# Patient Record
Sex: Female | Born: 1980
Health system: Southern US, Community
[De-identification: ages and names within clinical notes are randomized; demographics above are authoritative.]

## PROBLEM LIST (undated history)

## (undated) DIAGNOSIS — F32A Depression, unspecified: Secondary | ICD-10-CM

## (undated) DIAGNOSIS — D649 Anemia, unspecified: Secondary | ICD-10-CM

## (undated) DIAGNOSIS — F419 Anxiety disorder, unspecified: Secondary | ICD-10-CM

## (undated) DIAGNOSIS — I10 Essential (primary) hypertension: Secondary | ICD-10-CM

## (undated) DIAGNOSIS — E669 Obesity, unspecified: Secondary | ICD-10-CM

## (undated) DIAGNOSIS — G43909 Migraine, unspecified, not intractable, without status migrainosus: Secondary | ICD-10-CM

## (undated) DIAGNOSIS — F329 Major depressive disorder, single episode, unspecified: Secondary | ICD-10-CM

## (undated) DIAGNOSIS — E785 Hyperlipidemia, unspecified: Secondary | ICD-10-CM

## (undated) DIAGNOSIS — M1990 Unspecified osteoarthritis, unspecified site: Secondary | ICD-10-CM

## (undated) HISTORY — DX: Major depressive disorder, single episode, unspecified: F32.9

## (undated) HISTORY — PX: KNEE SURGERY: SHX244

## (undated) HISTORY — PX: CHOLECYSTECTOMY: SHX55

## (undated) HISTORY — PX: WISDOM TOOTH EXTRACTION: SHX21

## (undated) HISTORY — DX: Hyperlipidemia, unspecified: E78.5

## (undated) HISTORY — DX: Anemia, unspecified: D64.9

## (undated) HISTORY — PX: ABDOMINAL HYSTERECTOMY: SHX81

## (undated) HISTORY — PX: CARPAL TUNNEL RELEASE: SHX101

## (undated) HISTORY — DX: Obesity, unspecified: E66.9

## (undated) HISTORY — DX: Unspecified osteoarthritis, unspecified site: M19.90

## (undated) HISTORY — DX: Depression, unspecified: F32.A

## (undated) HISTORY — PX: TUBAL LIGATION: SHX77

---

## 1998-04-18 ENCOUNTER — Other Ambulatory Visit: Admission: RE | Admit: 1998-04-18 | Discharge: 1998-04-18 | Payer: Self-pay

## 2001-01-27 ENCOUNTER — Other Ambulatory Visit: Admission: RE | Admit: 2001-01-27 | Discharge: 2001-01-27 | Payer: Self-pay | Admitting: Obstetrics and Gynecology

## 2001-04-07 ENCOUNTER — Ambulatory Visit (HOSPITAL_COMMUNITY): Admission: RE | Admit: 2001-04-07 | Discharge: 2001-04-07 | Payer: Self-pay | Admitting: Obstetrics and Gynecology

## 2001-04-07 ENCOUNTER — Encounter: Payer: Self-pay | Admitting: Obstetrics and Gynecology

## 2001-04-25 ENCOUNTER — Inpatient Hospital Stay (HOSPITAL_COMMUNITY): Admission: AD | Admit: 2001-04-25 | Discharge: 2001-04-25 | Payer: Self-pay | Admitting: Obstetrics and Gynecology

## 2001-06-25 ENCOUNTER — Inpatient Hospital Stay (HOSPITAL_COMMUNITY): Admission: AD | Admit: 2001-06-25 | Discharge: 2001-06-25 | Payer: Self-pay | Admitting: Obstetrics and Gynecology

## 2001-07-26 ENCOUNTER — Ambulatory Visit (HOSPITAL_COMMUNITY): Admission: RE | Admit: 2001-07-26 | Discharge: 2001-07-26 | Payer: Self-pay | Admitting: Obstetrics and Gynecology

## 2001-07-26 ENCOUNTER — Encounter: Payer: Self-pay | Admitting: Obstetrics and Gynecology

## 2001-08-10 ENCOUNTER — Inpatient Hospital Stay (HOSPITAL_COMMUNITY): Admission: AD | Admit: 2001-08-10 | Discharge: 2001-08-10 | Payer: Self-pay | Admitting: Obstetrics and Gynecology

## 2001-08-18 ENCOUNTER — Inpatient Hospital Stay (HOSPITAL_COMMUNITY): Admission: AD | Admit: 2001-08-18 | Discharge: 2001-08-21 | Payer: Self-pay | Admitting: Obstetrics and Gynecology

## 2001-08-18 ENCOUNTER — Encounter (INDEPENDENT_AMBULATORY_CARE_PROVIDER_SITE_OTHER): Payer: Self-pay

## 2002-08-01 ENCOUNTER — Encounter: Admission: RE | Admit: 2002-08-01 | Discharge: 2002-08-01 | Payer: Self-pay | Admitting: Family Medicine

## 2002-08-01 ENCOUNTER — Encounter: Payer: Self-pay | Admitting: Family Medicine

## 2002-11-02 ENCOUNTER — Emergency Department (HOSPITAL_COMMUNITY): Admission: EM | Admit: 2002-11-02 | Discharge: 2002-11-02 | Payer: Self-pay | Admitting: Emergency Medicine

## 2002-11-02 ENCOUNTER — Encounter: Payer: Self-pay | Admitting: Emergency Medicine

## 2003-08-14 ENCOUNTER — Emergency Department (HOSPITAL_COMMUNITY): Admission: EM | Admit: 2003-08-14 | Discharge: 2003-08-14 | Payer: Self-pay | Admitting: Emergency Medicine

## 2003-09-16 ENCOUNTER — Emergency Department (HOSPITAL_COMMUNITY): Admission: EM | Admit: 2003-09-16 | Discharge: 2003-09-17 | Payer: Self-pay | Admitting: Emergency Medicine

## 2003-09-17 ENCOUNTER — Emergency Department (HOSPITAL_COMMUNITY): Admission: EM | Admit: 2003-09-17 | Discharge: 2003-09-18 | Payer: Self-pay | Admitting: Emergency Medicine

## 2004-07-29 ENCOUNTER — Emergency Department (HOSPITAL_COMMUNITY): Admission: EM | Admit: 2004-07-29 | Discharge: 2004-07-30 | Payer: Self-pay | Admitting: Emergency Medicine

## 2004-12-02 ENCOUNTER — Emergency Department: Payer: Self-pay | Admitting: Unknown Physician Specialty

## 2005-01-11 ENCOUNTER — Emergency Department: Payer: Self-pay | Admitting: Emergency Medicine

## 2005-02-12 ENCOUNTER — Emergency Department: Payer: Self-pay | Admitting: Emergency Medicine

## 2005-03-01 ENCOUNTER — Emergency Department: Payer: Self-pay | Admitting: Emergency Medicine

## 2007-05-30 ENCOUNTER — Inpatient Hospital Stay (HOSPITAL_COMMUNITY): Admission: EM | Admit: 2007-05-30 | Discharge: 2007-06-01 | Payer: Self-pay | Admitting: Emergency Medicine

## 2007-06-01 ENCOUNTER — Inpatient Hospital Stay (HOSPITAL_COMMUNITY): Admission: AD | Admit: 2007-06-01 | Discharge: 2007-06-02 | Payer: Self-pay | Admitting: *Deleted

## 2007-06-01 ENCOUNTER — Ambulatory Visit: Payer: Self-pay | Admitting: *Deleted

## 2007-10-05 ENCOUNTER — Ambulatory Visit: Payer: Self-pay | Admitting: Vascular Surgery

## 2007-10-05 ENCOUNTER — Inpatient Hospital Stay (HOSPITAL_COMMUNITY): Admission: EM | Admit: 2007-10-05 | Discharge: 2007-10-07 | Payer: Self-pay | Admitting: Emergency Medicine

## 2008-05-22 ENCOUNTER — Emergency Department (HOSPITAL_COMMUNITY): Admission: EM | Admit: 2008-05-22 | Discharge: 2008-05-23 | Payer: Self-pay | Admitting: Emergency Medicine

## 2008-06-08 ENCOUNTER — Emergency Department (HOSPITAL_COMMUNITY): Admission: EM | Admit: 2008-06-08 | Discharge: 2008-06-08 | Payer: Self-pay | Admitting: Emergency Medicine

## 2008-06-25 ENCOUNTER — Ambulatory Visit (HOSPITAL_COMMUNITY): Admission: RE | Admit: 2008-06-25 | Discharge: 2008-06-25 | Payer: Self-pay | Admitting: Orthopedic Surgery

## 2008-08-09 ENCOUNTER — Encounter: Admission: RE | Admit: 2008-08-09 | Discharge: 2008-08-28 | Payer: Self-pay | Admitting: Orthopedic Surgery

## 2008-08-22 ENCOUNTER — Emergency Department (HOSPITAL_COMMUNITY): Admission: EM | Admit: 2008-08-22 | Discharge: 2008-08-22 | Payer: Self-pay | Admitting: Emergency Medicine

## 2009-05-17 ENCOUNTER — Emergency Department (HOSPITAL_COMMUNITY): Admission: EM | Admit: 2009-05-17 | Discharge: 2009-05-17 | Payer: Self-pay | Admitting: Emergency Medicine

## 2010-07-25 ENCOUNTER — Encounter: Admission: RE | Admit: 2010-07-25 | Discharge: 2010-07-25 | Payer: Self-pay | Admitting: Family Medicine

## 2010-10-26 ENCOUNTER — Encounter: Payer: Self-pay | Admitting: Family Medicine

## 2011-02-18 NOTE — Procedures (Signed)
EEG NUMBER:  [id number].   HISTORY OF PRESENT ILLNESS:  This is almost a 30 year old woman with anxiety and depression and  syncope issues.  Medications include Lovenox, Protonix, Ativan, Tylenol,  Zofran, Maalox, oxycodone, Dilaudid, guaifenesin, Ambien.  This was a  portable 17-channel EEG with one channel devoted to EKG utilizing  International 10/20 lead placement system.  The patient was described  clinically as being awake and drowsy.  She also appeared to be awake and  drowsy electrographically.  While awake, the background consisted of a  well-organized well-developed, well modulated 10 Hz alpha activity which  was predominant in posterior head regions and reactive to eye opening.  No interhemispheric asymmetry is identified.  No definite epileptiform  discharges were seen.  Photic stimulation did not produce any change in  background activity.  Hyperventilation, likewise produced no significant  change in the background activity.  During drowsiness, the patient's  background demonstrated decreased amplitude and frequency and some  central sharp activity.  The EKG monitor reveals relatively regular  rhythm with a rate of 72 beats per minute.   CONCLUSION:  Normal awake and drowsy EEG without seizure activity seen  during the course of today's recording.  Clinical correlation is  recommended.      Catherine A. Orlin Hilding, M.D.  Electronically Signed     GNF:AOZH  D:  10/06/2007 20:01:21  T:  10/07/2007 05:20:04  Job #:  [id number]

## 2011-02-18 NOTE — H&P (Signed)
NAMETENEIL, SHILLER              ACCOUNT NO.:  [id number]   MEDICAL RECORD NO.:  [id number]          PATIENT TYPE:  INP   LOCATION:  1428                         FACILITY:  [facility]   PHYSICIAN:  Della Goo, M.D. DATE OF BIRTH:  01/17/1981   DATE OF ADMISSION:  05/30/2007  DATE OF DISCHARGE:                              HISTORY & PHYSICAL   This is an unassigned patient.   CHIEF COMPLAINT:  Overdose.   HISTORY OF PRESENT ILLNESS:  This is a 30 year old female presenting to  the emergency department after taking an overdose of Xanax.  She reports  taking 10-12 Xanax 2 mg tablets after having a dispute with her mother  this evening.  The patient reports that she lives with her mother and  there has been tension in the household.  She denies that the incident  was a suicidal gesture.  She reports just wanting to feel better and  rest.   PAST MEDICAL HISTORY:  History of anxiety and depression.   PAST SURGICAL HISTORY:  History of a tubal ligation and C-section.   MEDICATIONS:  Xanax 2 mg one p.o. b.i.d. p.r.n.   ALLERGIES:  LATEX, SULFA.   SOCIAL HISTORY:  Nonsmoker, nondrinker.   FAMILY HISTORY:  Noncontributory   REVIEW OF SYSTEMS:  The patient denies having any nausea, vomiting,  diarrhea, constipation, chest pain, shortness of breath.  She does  report being sleepy at this time.   PHYSICAL EXAMINATION FINDINGS:  This is a 30 year old obese, well-  developed female in no acute distress  VITAL SIGNS:  (currently) Temperature 97.1, blood pressure 127/58, heart  rate 102, respirations 16-18, O2 saturation 97-100%.  HEENT:  Normocephalic, atraumatic.  Pupils equally round and reactive to  light.  Extraocular muscles are intact. Funduscopic benign.  Oropharynx  is clear.  NECK:  Supple and full range of motion.  No thyromegaly, adenopathy or  jugular venous distention.  CARDIOVASCULAR:  Regular rate and rhythm.  No murmurs, gallops or rubs.  LUNGS: Clear to  auscultation bilaterally.  ABDOMEN:  Positive bowel sounds, soft, nontender, nondistended.  EXTREMITIES:  Without cyanosis, clubbing or edema.  NEUROLOGIC:  The patient is drowsy but awake.  Her cranial nerves are  intact.  There are no motor or sensory deficits.  Her speech is clear  and her responses are appropriate.   LABORATORY STUDIES:  White blood cell count 11.7, hemoglobin 14.2,  hematocrit 41.4, MCV 81.5, platelets 424, neutrophils 71%, lymphocytes  22%.  Chemistry:  Sodium 144, potassium 4.0, chloride 108, CO2 28, BUN  7, creatinine 0.57, glucose 90.  Urine HCG negative.  Urine drug screen  positive for benzodiazepines.  Liver function tests:  Total bilirubin  0.5, alkaline phosphatase 49, AST 21, ALT 28, albumin 3.6.  Salicylates  less than 4.  Prothrombin time 0.8, INR 0.9.  Urinalysis:  Trace  leukocytes and large hemoglobin.   ASSESSMENT:  A 30 year old female being admitted with:  1. Xanax overdose.  2. Situational depression.  3. Obesity  4. Anxiety depression.   PLAN:  The patient will be admitted to a telemetry area for cardiac  monitoring.  Her oxygen will be monitored continuously as well.  A one-  to-one sitter has been requested and the patient has been placed on  suicide precautions.  The patient is n.p.o. at this particular point,  and IV fluids have been ordered.  A psychiatric consultation will also  be ordered.  GI and DVT prophylaxis have been ordered as well.      Della Goo, M.D.  Electronically Signed     HJ/MEDQ  D:  05/31/2007  T:  05/31/2007  Job:  [id number]

## 2011-02-18 NOTE — H&P (Signed)
NAMEAILEENA, Tanya [name]              ACCOUNT NO.:  [id number]   MEDICAL RECORD NO.:  [id number]          PATIENT TYPE:  INP   LOCATION:  5524                         FACILITY:  [facility]   PHYSICIAN:  Lonia Blood, M.D.DATE OF BIRTH:  December 31, 1980   DATE OF ADMISSION:  10/04/2007  DATE OF DISCHARGE:                              HISTORY & PHYSICAL   PRIMARY CARE PHYSICIAN:  Unassigned.   CHIEF COMPLAINT:  Syncope.   HISTORY OF PRESENT ILLNESS:  Ms. Tanya [name] is a 30 year old female  with a medical history as detailed below.  She had two distinct episodes  of syncope today.  She states this morning she was sitting, stood up to  answer the phone and shortly thereafter found herself on the floor.  Her  children were present.  Questioning from the family gives the history  from the children, that their mother did not shake or convulse.  There  was no loss of bowel or bladder continence.  The patient quickly  recovered.  She had a headache afterwards but no other symptoms.  There  was no promontory chest pain, shortness of breath or chest pressure.  The patient went about the day without difficulty.  Later in the day,  the patient stood up from a sitting position to walk across the room to  retrieve an object.  Again, she found herself on the floor some time  shortly thereafter.  EMS was called.  They transported the patient to  the emergency room.  Again, the patient had a headache after her syncope  but no other symptoms.  At present, she is resting comfortably.  The  patient reports that she had an episode of syncope multiple years ago,  underwent evaluation, and was told that they did not find anything  wrong.   REVIEW OF SYSTEMS:  A comprehensive review of systems is unremarkable.  The patient states that she has been in her usual state of health until  today.   PAST MEDICAL HISTORY:  1. Anxiety disorder.  2. Depression with overdose on Xanax August 2008.  3. Status post  tubal ligation.  4. Status post C-section x1 and spontaneous vaginal delivery x 1.  5. Hypertension.  6. Hypercholesterolemia.  7. Status post cholecystectomy.   PATIENT MEDICATIONS:  P.r.n. Xanax only.   ALLERGIES:  LATEX AND SULFA.   FAMILY HISTORY:  Reviewed in old records but is noncontributory to this  admission.   SOCIAL HISTORY:  The patient does not smoke.  She does not drink.  She  is divorced x2.  She lives with her mother.   DATA REVIEW:  BMET is unremarkable.  CBC is unremarkable.  Urine  pregnancy is negative.  CT scan of the head reveals an 8 mm lacunar  infarct on the left basal ganglia which appears to be subacute or older   PHYSICAL EXAMINATION:  VITAL SIGNS:  Temperature 97.3, blood pressure  127/72, heart rate 102, respiratory rate 20, O2 sat 99% on room air.  CBG 79.  GENERAL:  She is a well-developed, well-nourished female in no acute  respiratory distress.  HEENT:  Normocephalic, atraumatic.  Pupils equal, round, react to light  and accommodation, extraocular movements intact bilaterally, OC/OP  clear.  NECK:  No JVD, no lymphadenopathy.  LUNGS:  Clear to auscultation bilaterally, no wheeze or rhonchi.  CARDIOVASCULAR:  Regular rate and rhythm without murmur, gallop or rub,  no S1 or S2.  ABDOMEN:  Somewhat overweight, soft, bowel sounds present, no  hepatosplenomegaly, no rebound, no ascites.  EXTREMITIES:  No significant cyanosis, clubbing, edema of bilateral  lower extremities.  NEUROLOGIC:  Alert and oriented x4.  Cranial nerves II-XII are intact  bilaterally, 5/5 strength upper and lower extremities.  Intact sensation  to touch throughout.  No Babinski.   IMPRESSION/PLAN:  1. Syncope.  There are multiple potential etiologies for syncope in      this patient.  There is no evidence of an arrhythmia.  There is no      evidence of an acute myocardial infarction (which would be      exceedingly rare nonetheless).  There are no symptoms to suggest a       pulmonary embolism.  I am worried about the possibility of      substance abuse.  Urine drug screen is currently pending.  We will      add acetaminophen and aspirin levels given the patient's history of      attempted overdose.  This could simply represent a volume      depletion.  This could also be a vasovagal event.  We will check      orthostatics q.a.m.  We will monitor on telemetry.  We will check a      TSH.  We will further evaluate the CT scan findings as noted below.  2. Left basal ganglia lacuna.  The finding of a previous small CVA in      this 30 year old that is extremely unusual.  I wish to better      define this area of MRI.  The patient reports that she is extremely      claustrophobic.  We will premedicate the patient with Ativan      tomorrow, and we will carry out an MRI and MRA to further evaluate      and rule out any connection with her presenting symptom.  3. Hypertension.  The patient's blood pressure is currently well-      controlled.  4. Depression.  The patient appears to not be on any antidepressants      at the present time.  We will follow her symptoms clinically.      Lonia Blood, M.D.  Electronically Signed     JTM/MEDQ  D:  10/05/2007  T:  10/05/2007  Job:  [id number]

## 2011-02-18 NOTE — Discharge Summary (Signed)
Tanya [name], Tanya [name]              ACCOUNT NO.:  [id number]   MEDICAL RECORD NO.:  [id number]          PATIENT TYPE:  INP   LOCATION:  5526                         FACILITY:  [facility]   PHYSICIAN:  Wilson Singer, M.D.DATE OF BIRTH:  Jul 01, 1981   DATE OF ADMISSION:  10/04/2007  DATE OF DISCHARGE:  10/07/2007                               DISCHARGE SUMMARY   FINAL DISCHARGE DIAGNOSIS:  Syncope, unclear etiology.   CONDITION ON DISCHARGE:  Stable.   MEDICATIONS ON DISCHARGE:  None.   HISTORY OF PRESENT ILLNESS:  This 30 year old lady was admitted with a  syncopal episode which occurred twice.  Please see initial history and  physical examination done by Dr. Jetty Duhamel.   HOSPITAL PROGRESS:  The patient had a CT scan which showed a left basal  ganglion lacunar infarct possibly.  However, when she underwent an MRI  scan of the brain and this did not confirm any infarct whatsoever.  I  think there was an abnormality seen which was misinterpreted.  She was  seen by neurology who agreed with this finding that the MRI scan was  normal.  She also underwent an EEG which entirely normal.  She did not  have any arrhythmias while in the hospital.  I wonder if she did  actually have a seizure but this needs to be worked up eventually as an  outpatient.  Dr. Orlin Hilding has seen the patient and has agree that she is  stable to be discharged home and I asked her to go home and stay with  her mother for the next few days so that she can be kept an eye on.  She  must follow up with Dr. Orlin Hilding in the neurology outpatient office and  also her primary care physician for further evaluation of these syncopal  episodes.  She should not do any driving in the meantime until this is  investigated.      Wilson Singer, M.D.  Electronically Signed     NCG/MEDQ  D:  10/07/2007  T:  10/07/2007  Job:  [id number]

## 2011-02-18 NOTE — Discharge Summary (Signed)
NAMELEIA, COLETTI              ACCOUNT NO.:  [id number]   MEDICAL RECORD NO.:  [id number]          PATIENT TYPE:  IPS   LOCATION:  0604                          [facility]   PHYSICIAN:  Jasmine Pang, M.D. DATE OF BIRTH:  Feb 22, 1981   DATE OF ADMISSION:  06/01/2007  DATE OF DISCHARGE:  06/02/2007                               DISCHARGE SUMMARY   IDENTIFICATION:  This is a 30 year old divorced white female who was  admitted to [facility] for benzodiazepine overdose.  She took  10 to 12 2 mg Xanax.  She states she was fighting with her mother.  She  acknowledges she deliberately took an overdose after one such argument.  She describes approximately three weeks of depressed mood, decreased  energy, irritability and some reduction in interest, difficulty  concentrating and insomnia.  The patient describes approximately has a 52-  year-old and a 32 year old and she has been forced to move back in with  her mother due to her ex-husband being in jail.  When her husband ex-  husband was out of jail, the patient and he would share child rearing  and watching activities.  The patient and her mother do not have any  physical abuse occurring; however, they will frequently get into a  verbal disputes over a number of differences.  The patient has not had  any hallucinations or delusions.  She has been experiencing insomnia  being up to approximately 3:00 a.m. in the morning.  Then when the  children get up, she is very exhausted and miserable.  She has had  difficulty performing her ADLs.  She denies any suicide attempt,  however, she acknowledges that her judgment has been impaired and she  has been impulsive with her fatigue, insomnia, irritability and  depression.  The patient denies any previous self-harm gestures.  She  has never been in a psychiatric hospital.  She denies psychiatric care.  She has been treated with Xanax 2 mg p.o. b.i.d. for excessive feeling  on edge,  muscle tension and excessive worry.  There is no known family  psychiatric history.  The patient denies any alcohol or illegal drug  abuse.  She is not on any medication medically.  She status post an  overdose of benzodiazepine.  The patient's obtundation has resolved.  She is allergic to codeine, latex and sulfa drugs.   PHYSICAL FINDINGS:  A complete physical exam was done while she was in  the hospital.  She had no acute physical problems.   LABORATORY DATA:  These were done in the hospital prior to her transfer  to our unit.  Aspirin was negative.  Hepatic function panel was  unremarkable.  SGOT normal at 21.  SGPT normal at 28.  Urine drug screen  is positive for benzodiazepine.  Urine HCG was negative.  Alcohol  negative.  Tylenol negative.  WBC 11.7, hemoglobin 14.2 and platelet  count 424.   HOSPITAL COURSE:  Upon admission, the patient was started on Ambien 5 mg  p.o. q.h.s. p.r.n. insomnia.  She was also started on Ativan 1 mg p.o.  q.6h. p.r.n. symptoms of agitation or withdrawal.  The patient tolerated  her medications well with no side effects to the Ambien.  She did not  use the Ativan.  She was friendly and cooperative.  She was able to  participate appropriately in unit therapeutic groups and activities.  She denies that she was or is suicidal.  She states she just wanted  relief from her anxiety.  She is very much wanting to return home  because her children have just started school.  She states she realizes  she is a caretaker for others and was willing to consider therapy and  psychiatric followup.  Her mental status upon discharge was euthymic  mood, the affect wide range.  No suicidal or homicidal ideation.  No  thoughts of self injurious behavior.  No auditory or visual  hallucinations.  No paranoia or delusions.  Thoughts were logical and  goal-directed.  Thought content no predominant theme.  Cognitive was  grossly back to baseline which was within normal  limits.  She was felt  to be safe to be discharged home today and has agreed to followup  treatment once home.  She was going to get a ride home from the hospital  with her mother.   DISCHARGE DIAGNOSES:  AXIS I:  1. Major depressive disorder, recurrent, severe.  2. Major depressive disorder, single episode, severe.  3. Anxiety disorder not otherwise specified.  AXIS II:  None.  AXIS III:  None.  (Status post benzodiazepine overdose, but medically  stable now).  AXIS IV:  Moderate (primary support group, marital, economic, burden of  psychiatric illness).  AXIS V:  Global assessment of functioning upon discharge was 50.  Global  assessment of functioning upon admission was 45.  Global assessment of  functioning highest past year 65-70.   DISCHARGE PLANS:  There were no specific activity level or dietary  restrictions.   POST HOSPITAL CARE PLANS:  The patient will be seen at the [facility] on June 03, 2007 at 9 o'clock a.m.  She will be  seen at [facility] with Marquis Lunch on June 08, 2007 at 1:30 p.m.   DISCHARGE MEDICATIONS:  1. Celexa 20 mg daily (this was just started for her).  2. Ambien 5 mg 1-2 pills at bedtime.  3. She was also advised to resume her Xanax 2 mg p.o. b.i.d. until she      sees her new psychiatrist.      Jasmine Pang, M.D.  Electronically Signed     BHS/MEDQ  D:  06/02/2007  T:  06/03/2007  Job:  [id number]

## 2011-02-18 NOTE — H&P (Signed)
Tanya [name], Tanya [name]              ACCOUNT NO.:  [id number]   MEDICAL RECORD NO.:  [id number]          PATIENT TYPE:  IPS   LOCATION:  0604                          [facility]   PHYSICIAN:  Jasmine Pang, M.D. DATE OF BIRTH:  1981-04-28   DATE OF ADMISSION:  06/01/2007  DATE OF DISCHARGE:  06/02/2007                       PSYCHIATRIC ADMISSION ASSESSMENT   IDENTIFYING INFORMATION:  This is a 30 year old female voluntarily  admitted on June 01, 2007.   HISTORY OF PRESENT ILLNESS:  The patient presents with a history of  intentional overdose, taking 10-12 2 mg Xanax of her own medications  after arguing with her mother.  The patient states that she was hoping  to feel better.  She states it was very impulsive.  She denies it was a  suicide attempt.  She was not feeling well and then her mother-in-law  had noticed that her speech was slurred and had called 9-1-1.  The  patient is feeling very overwhelmed.  Her stressors are she has had to  move back in with her mother, her fiance is in jail, hopeful to be  released in September.  She is unemployed.  She has to care for  everyone, two children she is caring for.  The does not feel like she  needs to be here.  Needs to be home with her children.   PAST PSYCHIATRIC HISTORY:  First admission to the [facility].  No prior inpatient or outpatient treatment.   SOCIAL HISTORY:  This is a 30 year old single female.  Has two children,  ages 65 and 27.  The patient is currently living with her mother.  Will  be moving out when her fiance gets out of jail.  She is unemployed.   FAMILY HISTORY:  None.   ALCOHOL/DRUG HISTORY:  No alcohol or drug use.   PRIMARY CARE PHYSICIAN:  Dr. Clarene [name].   MEDICAL PROBLEMS:  Denies any acute or chronic major medical issues.  Has a complaint of a toothache however.   MEDICATIONS:  Xanax 2 mg b.i.d.  Normally just takes Xanax 2 mg at  bedtime which she states gets her some relief with  insomnia but wakes up  during the night and is feeling groggy in the morning.   ALLERGIES:  No known allergies.   PHYSICAL EXAMINATION:  The patient was assessed at [facility]  where she was admitted for her overdose.  She is an overweight, young  female.  She is in no acute distress.  Her temperature is 97.5, heart  rate 92, respirations 18, blood pressure is 130/95, weight 256 pounds,  height 5 feet 1 inch tall.   LABORATORY DATA:  Her urine drug screen was positive for  benzodiazepines.  Pregnancy test is negative.  Acetaminophen level less  than 10.  Salicylate level less than 4.  WBC count is mildly elevated at  11.7.   MENTAL STATUS EXAM:  She is fully alert, cooperative, fair eye contact,  casually dressed.  Speech is clear, normal pace and tone.  The patient's  mood is depressed, some mild anxiety.  Affect is pleasant, agreeable  to  recommendations.  Her thought process is no evidence any thought  disorder.  No delusions.  No paranoia.  Denied any suicidal or homicidal  thoughts.  Cognitive function intact.  Her memory is good.  Judgment and  insight is good.  She appears sincere.   DIAGNOSES:  AXIS I:  Major depressive disorder, single episode.  AXIS II:  Deferred.  AXIS III:  Status post benzodiazepine overdose.  AXIS IV:  Other psychosocial problems, problems with primary support  group relating to her mother, problems with occupation).  AXIS V:  45.   PLAN:  Contract for safety.  Stabilize her mood and thinking.  Will  consider a family session with her mother.  The patient could benefit  from some outpatient therapy and a trial of an antidepressant.   TENTATIVE LENGTH OF STAY:  Two to three days.      Landry Corporal, N.P.      Jasmine Pang, M.D.  Electronically Signed    JO/MEDQ  D:  06/02/2007  T:  06/02/2007  Job:  [id number]

## 2011-02-18 NOTE — Consult Note (Signed)
[name], TIEKEN              ACCOUNT NO.:  [id number]   MEDICAL RECORD NO.:  [id number]          PATIENT TYPE:  INP   LOCATION:  5526                         FACILITY:  [facility]   PHYSICIAN:  Catherine A. Orlin Hilding, M.D.DATE OF BIRTH:  [birth year]   DATE OF CONSULTATION:  10/06/2007  DATE OF DISCHARGE:                                 CONSULTATION   CHIEF COMPLAINT:  Syncope.   HISTORY OF PRESENT ILLNESS:  Ms. Tanya [name] is a 30-year right-  handed white woman with a history of anxiety and depression.  According  to the chart the patient denies any medical history.  She had a syncopal  episode about five years ago with a negative workup.  She does not  remember much of the details.  Apparently she had two spells yesterday.  She gives me a somewhat different history than is available in the  admitting physician's history.  She tells me the first episode she was  sitting on the couch with her daughter and then she does slumped over  and that her daughter said that she was shaking.  She came to and  shortly after that was doing reasonably well.  Later in the day she was  walking down the hall talking to her husband on the telephone and  apparently just lost consciousness and passed out.  Her 10 year old  daughter called the patient's mother who then called 9-1-1 and she was  brought in for evaluation.  She denies any premonitory signs of any  kind.  This history differs slightly from that given to the admitting  physician who said that the initial episode she had been sitting, stood  up to answer the phone and then shortly thereafter found herself on the  floor.  This also says that her children were present and the children  did not see any shaking or convulsing,  although now I am getting a  history from the patient that her daughter said that she did have some  shaking.  She did again have a headache afterwards but no other  symptoms.  The second episode was that she was walking  down the hall and  talking on the phone; that seems similar to what she gave me.  She has  had some intermittent headaches since then and felt generally poorly  since then in a nonspecific fashion.   REVIEW OF SYSTEMS:  A 12-system review is negative except for nausea and  headache currently.   PAST MEDICAL HISTORY:  She says is negative for any medical problems of  any kind, however, the chart indicates that she has hyperlipidemia and  hypertension and significant depression and anxiety.  She was  hospitalized with a Xanax overdose in August 2008.  She is obese.  She  has had a tubal ligation and a C-section as well as a cholecystectomy.   MEDICATIONS:  She said she was not taking anything by prescription but  the admission notes indicate p.r.n. Xanax only.  Despite the recent  overdose on Xanax in August.   ALLERGIES:  LATEX AND SULFA.   SOCIAL HISTORY:  She is  married, has two children.  Does not smoke or  drink alcohol.  Again I get a different history.  The chart says that  she is divorced but she said that she is married and a man in the room  with her at the moment claims to be her husband.   FAMILY HISTORY:  Positive for a half brother with a developmental delay  and congenital abnormality of some sort of with seizures.   OBJECTIVE:  VITAL SIGNS:  Temperature is 97.6, pulse 78, respirations  20, blood pressure is 126/81, 96% sat on room air.  She is obese.  HEAD:  Normocephalic, atraumatic.  NECK:  Supple without bruits.  HEART:  Regular rate and rhythm.   MENTAL STATUS:  GENERAL:  She is awake, alert with normal language.  CRANIAL NERVES:  Her pupils are equal and reactive.  Visual fields are  full.  Extraocular movements are intact.  Facial sensation is normal.  Facial motor activity is normal.  Hearing is intact.  Palate is  symmetric.  Tongue is in the midline and she had normal shoulder shrug.  MOTOR:  Shows normal station and gait.  She has no drift.  She has   normal rapid fine movements.  No satelliting.  She has normal bulk, tone  and strength throughout with 5/5 strength in all four extremities.  No  fasciculations, atrophy or tremor.  Deep tendon reflexes are 2-3+ and  symmetric with downgoing toes to plantar stimulation.  COORDINATION:  Finger-to-nose, heel-to-shin are normal.  Sensory is  normal.   LABORATORY DATA:  MRI scan of the brain is normal.  CT was read as  equivocal for an old lacuna because of a dilated prevascular space in  the inferior medial temporal region in the hypothalamic area under the  basal ganglia where there is very commonly a dilated perivascular space.  That is what it turned out to be an MR.  MRA is negative for any  vertebrobasilar insufficiency.  An EEG has been ordered but is pending  at this time.  Homocystine level is within acceptable limits at 6.  B-  met is normal except for mildly elevated glucose randomly at 104.  CBC  is normal.  TSH is normal.  Random cortisol is 25.6, not sure how to  interpret that.  Hemoglobin A1c is normal at 5.7.  Lipid profile does  show she has high cholesterol at 235 with a LDL of 171 which is rather  high.  Even though she does not give a history of that there is a  history in the chart of that.  Sed rate was 15 which is normal.  Drug  screen was positive for benzodiazepines.  As noted she is taking Xanax  p.r.n.   IMPRESSION:  Syncope of unclear etiology.  No evidence of  vertebrobasilar insufficiency or stroke by MRI, MRA or history.  I do  not think that this is seizure either but she does need an EEG.   RECOMMENDATIONS:  EEG.  Would continue cardiac monitoring while she is  in the house.      Catherine A. Orlin Hilding, M.D.  Electronically Signed     CAW/MEDQ  D:  10/06/2007  T:  10/06/2007  Job:  [id number]

## 2011-02-18 NOTE — Discharge Summary (Signed)
Tanya [name], CHAMBERS              ACCOUNT NO.:  [id number]   MEDICAL RECORD NO.:  [id number]          PATIENT TYPE:  INP   LOCATION:  1428                         FACILITY:  [facility]   PHYSICIAN:  Isidor Holts, M.D.  DATE OF BIRTH:  11-Feb-1981   DATE OF ADMISSION:  05/30/2007  DATE OF DISCHARGE:  05/31/2007                               DISCHARGE SUMMARY   PRIMARY CARE PHYSICIAN:  Unassigned.   DISCHARGE DIAGNOSES:  1. Benzodiazepine overdose.  2. Suicide attempt.  3. History of anxiety/depression.   DISCHARGE MEDICATIONS:  None.   PROCEDURES:  None.   CONSULTATIONS:  Dr. Antonietta Breach, psychiatrist.   ADMISSION HISTORY:  As in H&P notes of May 30, 2007 dictated by Dr.  Della Goo.  However, in brief, this is a 30 year old female with  known history of anxiety and depression, prior history of tubal  ligation, who presents following ingestion of 10-12 x 2-mg tablets of  Xanax, following a dispute with her mother.  She was admitted for  further evaluation, investigation, and management.   CLINICAL COURSE:  1. Benzodiazepine overdose.  The patient took a moderate amount of 2-      mg Xanax, i.e., 10-12 tablets.  At the time she was seen in the      emergency department, she was somewhat sleepy.  However, during the      course of overnight observation, the patient had no new issues or      developments.  She remained stable from a respiratory and      hemodynamic view point and on the a.m. of May 31, 2007, the      patient was fully alert, oriented, communicative, and showed no      evidence of respiratory distress whatsoever.   1. Suicide attempt.  For details of presentation, refer to admission      history above.  The patient was placed on one-on-one watch during      the course of hospitalization.  Psychiatric evaluation was      requested.   DISPOSITION:  As of May 31, 2007, the patient was deemed sufficiently  stable from a medical point to be  discharged, however, final disposition  will depend on psychiatry recommendations.   DIET:  Regular.   FOLLOWUP INSTRUCTIONS:  Per psychiatry.      Isidor Holts, M.D.  Electronically Signed     CO/MEDQ  D:  05/31/2007  T:  05/31/2007  Job:  [id number]
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
## 2011-02-21 NOTE — Op Note (Signed)
[facility]  Patient:    Tanya [name], Tanya [name] Visit Number: [id number] MRN: [id number]          Service Type: OBS Location: 910A [id number] [id number] Attending Physician:  Shaune Spittle Proc. Date: 08/18/01 Admit Date:  08/18/2001                             Operative Report  PREOPERATIVE DIAGNOSIS:       Intrauterine pregnancy at [redacted] weeks gestation. History of macrosomic infant.  History of should dystocia.  Desire for surgical sterilization.  POSTOPERATIVE DIAGNOSIS:      Intrauterine pregnancy at [redacted] weeks gestation. History of macrosomic infant.  History of should dystocia.  Desire for surgical sterilization.  OPERATION:                    Primary low transverse cesarean section and bilateral tubal ligation.  SURGEON:                      Vanessa P. Pennie Rushing, M.D.  ASSISTANT:                    Mack Guise, C.N.M.  ANESTHESIA:                   Spinal.  ESTIMATED BLOOD LOSS:         Less than 1000 cc.  COMPLICATIONS:                None.  FINDINGS:                     Uterus, tubes, and ovaries were normal for the gravid state.  The patient was delivered of a female infant whose name was Greig Castilla, weighing 7 pounds 15 ounces with Apgars of 8 and 9 at one and five minutes, respectively.  DESCRIPTION OF PROCEDURE:     The patient was taken to the operating room after appropriate identification and placed on the operating table.  After placement of a spinal anesthetic, she was placed in the supine position with a left lateral tilt.  The abdomen and perineum were prepped with multiple layers of Betadine and a Foley catheter inserted into the bladder under sterile conditions and connected to straight drainage.  The abdomen was draped as a sterile field.  A transverse incision was made in the abdomen after assurance of adequate anesthesia and the abdomen opened in layers.  The peritoneum was entered.  The uterus was incised approximately 2 cm  above the uterovesical fold and that incision taken laterally on either side.  The infant was delivered from the occipitotransverse position with the aid of a Kiwi vacuum extractor and after having the nares and pharynx suctioned and the cord clamped and cut, was handed off to the awaiting pediatricians.  The appropriate cord blood was drawn and the placenta manually removed from the uterus.  The uterine incision was closed with a running interlocking suture of 0 Vicryl.  An imbricating suture of 0 Vicryl was placed.  Copious irrigation was carried out.  The left fallopian tube was identified, followed to its fimbriated end, and then grasped at the isthmic portion and elevated.  A suture of 2-0 chromic was placed through the mesosalpinx and tied fore and aft on the knuckle of tube.  A second ligature was placed proximal to that.  The intervening knuckle of tube was excised and  the cut ends cauterized.  A similar procedure was carried out on the opposite side.  The portions of tube were removed from the operative field.  Copious irrigation was carried out and hemostasis noted to be adequate.  The abdominal peritoneum was closed with a running suture of 2-0 Vicryl.  The rectus muscles were reapproximated in the midline with a figure-of-eight suture of 2-0 Vicryl.  The rectus fascia was closed with a running suture of 0 Vicryl and then reinforced on either side of midline with figure-of-eight sutures of 0 Vicryl.  The subcutaneous tissue was irrigated and made hemostatic with Bovie cautery.  Skin staples were applied to the skin incision.  A sterile dressing was applied.  The patient was taken from the operating room to the recovery room in satisfactory condition having tolerated the procedure well with sponge, needle, and instrument counts were correct.  The infant went to the fullterm nursery. Attending Physician:  Shaune Spittle DD:  08/18/01 TD:  08/18/01 Job:  [id number]

## 2011-02-21 NOTE — Discharge Summary (Signed)
[facility]  Patient:    Tanya [name], Tanya [name] Visit Number: [id number] MRN: [id number]          Service Type: OBS Location: 910A [id number] [id number] Attending Physician:  Shaune Spittle Dictated by:   Wynelle Bourgeois, CNM Admit Date:  08/18/2001 Discharge Date: 08/21/2001                             Discharge Summary  ADMISSION DIAGNOSES:          1. Intrauterine pregnancy at 38 weeks.                               2. History of macrosomic infant.                               3. History of shoulder dystocia.                               4. Desires sterilization.  DISCHARGE DIAGNOSES:          1. Intrauterine pregnancy at 38 weeks.                               2. History of macrosomic infant.                               3. History of shoulder dystocia.                               4. Desires sterilization.                               5. Status post primary low transverse cesarean                                  section and bilateral tubal ligation.  HOSPITAL PROCEDURES:          1. Spinal anesthesia.                               2. Primary low transverse cesarean section.                               3. Bilateral tubal ligation.  HOSPITAL COURSE:              The patient was admitted for an elective primary low transverse cesarean section for a history of shoulder dystocia and this was performed by Maris Berger. Haygood, M.D., under spinal anesthesia.  She was delivered of a female infant named Greig Castilla weighing 7 pounds 15 ounces, Apgars 8 and 9, with an EBL of 1000 cc.  Her postoperative course was unremarkable other than difficulty in achieving pain management.  She complained of persistent burning in her incision despite trials of ibuprofen, Darvocet, and Demerol.  She also was given Toradol without relief and was finally relieved by Dilaudid and  Naprosyn.  On postoperative day #3, she was doing well and vital signs were stable.  She was  afebrile.  Her heart rate was with regular rate and rhythm.  Lungs clear to auscultation bilaterally.  The abdomen was soft and appropriately tender, except for burning pain at the site of her incision.  The fundus was firm and the incision was healing.  Staples were removed.  Lochia was small.  Extremities with negative Homans.  She was deemed to have received the full benefit of her hospital stay and was discharged home on August 21, 2001.  POSTOPERATIVE LABORATORY DATA:    White blood cell count 10.5, hemoglobin 9.9, hematocrit 28.8, platelets 311.  RPR nonreactive.  DISCHARGE MEDICATIONS:        Dilaudid 1 mg.  She is to take two of those every four hours p.r.n. pain.  She was also given a prescription for Naprosyn 500 mg b.i.d. p.r.n. pain.  DISCHARGE INSTRUCTIONS:       Per [facility] handout.  DISCHARGE FOLLOW-UP:          In six weeks or p.r.n. at [facility]. Dictated by:   Wynelle Bourgeois, CNM Attending Physician:  Shaune Spittle DD:  08/21/01 TD:  08/22/01 Job: [id number]

## 2011-07-08 LAB — RAPID URINE DRUG SCREEN, HOSP PERFORMED
Cocaine: NOT DETECTED
Tetrahydrocannabinol: NOT DETECTED

## 2011-07-08 LAB — ACETAMINOPHEN LEVEL: Acetaminophen (Tylenol), Serum: <10 — ABNORMAL LOW

## 2011-07-08 LAB — URINALYSIS, ROUTINE W REFLEX MICROSCOPIC
Glucose, UA: NEGATIVE
Ketones, ur: NEGATIVE
Nitrite: NEGATIVE
Protein, ur: NEGATIVE
Urobilinogen, UA: 0.2

## 2011-07-08 LAB — PREGNANCY, URINE: Preg Test, Ur: NEGATIVE

## 2011-07-08 LAB — POCT I-STAT, CHEM 8
BUN: 4 — ABNORMAL LOW
Chloride: 104
Creatinine, Ser: 0.8
Potassium: 3.8
Sodium: 141

## 2011-07-08 LAB — CBC
MCHC: 33.4
RBC: 5.14 — ABNORMAL HIGH
RDW: 13.1

## 2011-07-08 LAB — DIFFERENTIAL
Basophils Absolute: 0
Basophils Relative: 0
Monocytes Relative: 5
Neutro Abs: 7.8 — ABNORMAL HIGH
Neutrophils Relative %: 72

## 2011-07-08 LAB — HEPATIC FUNCTION PANEL
AST: 21
Albumin: 3.9

## 2011-07-11 LAB — DIFFERENTIAL
Eosinophils Absolute: 0.1
Eosinophils Relative: 1
Lymphocytes Relative: 17
Lymphs Abs: 1.5
Monocytes Relative: 9
Neutrophils Relative %: 72

## 2011-07-11 LAB — URINALYSIS, ROUTINE W REFLEX MICROSCOPIC
Hgb urine dipstick: NEGATIVE
Nitrite: NEGATIVE
Specific Gravity, Urine: 1.022
Urobilinogen, UA: 0.2

## 2011-07-11 LAB — BASIC METABOLIC PANEL
Calcium: 8.4
Chloride: 103
Creatinine, Ser: 0.61
GFR calc Af Amer: >60
GFR calc Af Amer: >60
GFR calc non Af Amer: >60
Glucose, Bld: 104 — ABNORMAL HIGH
Potassium: 4.4
Sodium: 138

## 2011-07-11 LAB — RAPID URINE DRUG SCREEN, HOSP PERFORMED
Amphetamines: NOT DETECTED
Benzodiazepines: POSITIVE — AB
Opiates: NOT DETECTED
Tetrahydrocannabinol: NOT DETECTED

## 2011-07-11 LAB — URINE CULTURE

## 2011-07-11 LAB — PROTIME-INR: Prothrombin Time: 12.8

## 2011-07-11 LAB — LIPID PANEL
LDL Cholesterol: 171 — ABNORMAL HIGH
Total CHOL/HDL Ratio: 7.1
VLDL: 31

## 2011-07-11 LAB — APTT: aPTT: 25

## 2011-07-11 LAB — CBC
HCT: 41.7
Hemoglobin: 12.8
MCV: 79.1
RBC: 5.27 — ABNORMAL HIGH
RDW: 12.6
WBC: 8.7

## 2011-07-11 LAB — POCT PREGNANCY, URINE: Operator id: 270651

## 2011-07-11 LAB — HEPATIC FUNCTION PANEL
ALT: 24
AST: 19
Total Protein: 6.4

## 2011-07-11 LAB — SALICYLATE LEVEL: Salicylate Lvl: <4

## 2011-07-11 LAB — MAGNESIUM: Magnesium: 1.8

## 2011-07-11 LAB — SEDIMENTATION RATE: Sed Rate: 15

## 2011-07-18 LAB — BASIC METABOLIC PANEL
BUN: 7
CO2: 28
Calcium: 9.2
GFR calc non Af Amer: >60
Glucose, Bld: 90

## 2011-07-18 LAB — RAPID URINE DRUG SCREEN, HOSP PERFORMED
Amphetamines: NOT DETECTED
Benzodiazepines: POSITIVE — AB
Cocaine: NOT DETECTED
Opiates: NOT DETECTED
Tetrahydrocannabinol: NOT DETECTED

## 2011-07-18 LAB — DIFFERENTIAL
Basophils Absolute: 0
Lymphocytes Relative: 22
Lymphs Abs: 2.6
Monocytes Absolute: 0.6
Monocytes Relative: 5
Neutro Abs: 8.3 — ABNORMAL HIGH

## 2011-07-18 LAB — CBC
Hemoglobin: 14.2
MCHC: 34.2
MCV: 81.5
RBC: 5.08
WBC: 11.7 — ABNORMAL HIGH

## 2011-07-18 LAB — HEPATIC FUNCTION PANEL
ALT: 28
AST: 21
Alkaline Phosphatase: 49
Bilirubin, Direct: <0.1

## 2011-07-18 LAB — URINALYSIS, ROUTINE W REFLEX MICROSCOPIC
Bilirubin Urine: NEGATIVE
Ketones, ur: NEGATIVE
Nitrite: NEGATIVE
Urobilinogen, UA: 0.2

## 2011-07-18 LAB — SALICYLATE LEVEL: Salicylate Lvl: <4

## 2011-07-18 LAB — ACETAMINOPHEN LEVEL: Acetaminophen (Tylenol), Serum: <10 — ABNORMAL LOW

## 2011-07-18 LAB — URINE MICROSCOPIC-ADD ON

## 2012-04-27 ENCOUNTER — Encounter (HOSPITAL_COMMUNITY): Payer: Self-pay

## 2012-04-27 ENCOUNTER — Emergency Department (HOSPITAL_COMMUNITY)
Admission: EM | Admit: 2012-04-27 | Discharge: 2012-04-27 | Disposition: A | Discharge status: Home / Self Care | Payer: Medicaid Other | Attending: Emergency Medicine | Admitting: Emergency Medicine

## 2012-04-27 ENCOUNTER — Emergency Department (HOSPITAL_COMMUNITY): Payer: Medicaid Other

## 2012-04-27 DIAGNOSIS — R42 Dizziness and giddiness: Secondary | ICD-10-CM | POA: Insufficient documentation

## 2012-04-27 DIAGNOSIS — R51 Headache: Secondary | ICD-10-CM | POA: Insufficient documentation

## 2012-04-27 DIAGNOSIS — R000 Tachycardia, unspecified: Secondary | ICD-10-CM | POA: Insufficient documentation

## 2012-04-27 LAB — URINALYSIS, ROUTINE W REFLEX MICROSCOPIC
Glucose, UA: NEGATIVE mg/dL
Leukocytes, UA: NEGATIVE
Protein, ur: NEGATIVE mg/dL
Specific Gravity, Urine: 1.005 (ref 1.005–1.030)
Urobilinogen, UA: 0.2 mg/dL (ref 0.0–1.0)

## 2012-04-27 LAB — PREGNANCY, URINE: Preg Test, Ur: NEGATIVE

## 2012-04-27 MED ORDER — DIPHENHYDRAMINE HCL 50 MG/ML IJ SOLN
25.0000 mg | Freq: Once | INTRAMUSCULAR | Status: AC
  Administered 2012-04-27: 25 mg via INTRAVENOUS
  Filled 2012-04-27: qty 1

## 2012-04-27 MED ORDER — METOCLOPRAMIDE HCL 5 MG/ML IJ SOLN
10.0000 mg | Freq: Once | INTRAMUSCULAR | Status: AC
  Administered 2012-04-27: 10 mg via INTRAVENOUS
  Filled 2012-04-27: qty 2

## 2012-04-27 MED ORDER — KETOROLAC TROMETHAMINE 30 MG/ML IJ SOLN
30.0000 mg | Freq: Once | INTRAMUSCULAR | Status: AC
  Administered 2012-04-27: 30 mg via INTRAVENOUS
  Filled 2012-04-27: qty 1

## 2012-04-27 MED ORDER — DEXAMETHASONE SODIUM PHOSPHATE 10 MG/ML IJ SOLN
10.0000 mg | Freq: Once | INTRAMUSCULAR | Status: AC
  Administered 2012-04-27: 10 mg via INTRAVENOUS
  Filled 2012-04-27: qty 1

## 2012-04-27 MED ORDER — SODIUM CHLORIDE 0.9 % IV BOLUS (SEPSIS)
1000.0000 mL | Freq: Once | INTRAVENOUS | Status: AC
  Administered 2012-04-27: 1000 mL via INTRAVENOUS

## 2012-04-27 NOTE — ED Notes (Signed)
Pt to ct 

## 2012-04-27 NOTE — ED Notes (Signed)
Pt snoring sound asleep.  toradol given as ordered\[id number]\

## 2012-04-27 NOTE — ED Notes (Signed)
Here for headache x 1 week, blurry vision and sweating spelss. sts same occurred 5 years ago and may have had a mini stroke,

## 2012-04-27 NOTE — ED Notes (Signed)
Pt states she will call husband for ride home. Pt reports headache 1/10 at this time.

## 2012-04-27 NOTE — ED Provider Notes (Signed)
Medical screening examination/treatment/procedure(s) were performed by non-physician practitioner and as supervising physician I was immediately available for consultation/collaboration.  Gerhard Munch, MD 04/27/12 2348

## 2012-04-27 NOTE — ED Notes (Signed)
Headache better pt very drowsy

## 2012-04-27 NOTE — ED Notes (Signed)
The pt has had a headache for 2 weeks  With nv.  She has had numerus treatments  Some she follows others she does not .  Complex headaches.  She has had some  fa she has had nothing that helps her  headaches

## 2012-04-27 NOTE — ED Provider Notes (Signed)
History     CSN: [id number]  Arrival date & time 04/27/12  1139   First MD Initiated Contact with Patient 04/27/12 1633      Chief Complaint  Patient presents with  . Migraine    (Consider location/radiation/quality/duration/timing/severity/associated sxs/prior treatment) HPI Comments: Patient presents with headache for the past week. She reports that the headache is sharp in nature and located to the bilateral frontal area. Occasional radiation to the occiput. Symptoms have gradually worsened and became severe today. No known aggravating/alleviating factors. Patient does endorse associated blurry seeming vision and occasional "sweating spells." She additionally has photophobia and phonophobia. She denies neck pain, stiffness, fever or chills, rash, chest pain, shortness of breath, abdominal pain, nausea, or vomiting. She states that she has a history of migraines. She had an especially severe migraine 5 years ago and reports that she was hospitalized for this. She was told that she may have had a "mini stroke." Imaging from the hospitalization was reviewed. She had normal MRA, MRI, and CT studies. She states this is not the worst headache of her life.  Patient is a 31 y.o. female presenting with headaches. The history is provided by the patient.  Headache  This is a recurrent problem. Episode onset: 1 wk ago. The problem occurs constantly. The problem has been gradually worsening. The headache is associated with bright light and loud noise. The pain is located in the frontal and occipital region. The quality of the pain is described as sharp. The pain is at a severity of 10/10. The pain is severe. The pain does not radiate. Associated symptoms include malaise/fatigue. Pertinent negatives include no fever, no chest pressure, no shortness of breath, no nausea and no vomiting. She has tried NSAIDs, acetaminophen and aspirin for the symptoms. The treatment provided no relief.    No past medical  history on file.  No past surgical history on file.  No family history on file.  History  Substance Use Topics  . Smoking status: Not on file  . Smokeless tobacco: Not on file  . Alcohol Use: Not on file    OB History    Grav Para Term Preterm Abortions TAB SAB Ect Mult Living                  Review of Systems  Constitutional: Positive for malaise/fatigue. Negative for fever.  Respiratory: Negative for shortness of breath.   Gastrointestinal: Negative for nausea and vomiting.  Neurological: Positive for headaches.  All other systems reviewed and are negative.  10 pt ROS as per HPI, otherwise negative  Allergies  Latex and Sulfa antibiotics  Home Medications   Current Outpatient Rx  Name Route Sig Dispense Refill  . ALPRAZOLAM 1 MG PO TABS Oral Take 1 mg by mouth 3 (three) times daily as needed. As needed for anxiety.    Marland Kitchen PHENTERMINE HCL 37.5 MG PO CAPS Oral Take 37.5 mg by mouth daily.      BP 106/66  Pulse 120  Temp 98.2 F (36.8 C) (Oral)  Resp 20  SpO2 100%  Physical Exam  Nursing note and vitals reviewed. Constitutional: She appears well-developed and well-nourished. No distress.  HENT:  Head: Normocephalic and atraumatic.  Mouth/Throat: Oropharynx is clear and moist. No oropharyngeal exudate.  Eyes: EOM are normal. Pupils are equal, round, and reactive to light.  Neck: Normal range of motion. Neck supple.       Able to range chin to chest, FROM of neck No meningeal signs  Cardiovascular: Regular rhythm and normal heart sounds.  Tachycardia present.   Pulmonary/Chest: Effort normal and breath sounds normal. She exhibits no tenderness.  Abdominal: Soft. There is no tenderness.  Musculoskeletal: Normal range of motion.  Lymphadenopathy:    She has no cervical adenopathy.  Neurological: She is alert.       Speech clear, pupils equal round reactive to light, extraocular movements intact   Normal peripheral visual fields Cranial nerves III through XII  normal including no facial droop Follows commands, moves all extremities x4, normal strength to bilateral upper and lower extremities at all major muscle groups including grip Sensation normal to light touch and pinprick Coordination intact, no limb ataxia, finger-nose-finger normal Rapid alternating movements normal No pronator drift Gait normal  Skin: Skin is warm and dry. She is not diaphoretic.  Psychiatric: She has a normal mood and affect.    ED Course  Procedures (including critical care time)  Labs Reviewed - No data to display Ct Head Wo Contrast  04/27/2012  *RADIOLOGY REPORT*  Clinical Data:  Migraine headaches, dizziness  CT HEAD WITHOUT CONTRAST  Technique:  Contiguous axial images were obtained from the base of the skull through the vertex without contrast  Comparison:  10/04/2007  Findings:  The brain has a normal appearance without evidence for hemorrhage, acute infarction, hydrocephalus, or mass lesion.  There is no extra axial fluid collection.  The skull and paranasal sinuses are normal.  IMPRESSION: Normal CT of the head without contrast.  Original Report Authenticated By: Judie Petit. Ruel Favors, M.D.     1. Headache       MDM  Patient presents with headache. States she has had similar headaches before. This is not the worst headache of her life. Neuro exam is nonfocal. Imaging negative. Patient treated with migraine cocktail and had relief of symptoms with this. Instructed on signs and symptoms that would prompt a return visit.        Grant Fontana, PA-C 04/27/12 1925

## 2012-04-28 ENCOUNTER — Encounter (HOSPITAL_COMMUNITY): Payer: Self-pay | Admitting: *Deleted

## 2012-04-28 ENCOUNTER — Emergency Department (HOSPITAL_COMMUNITY)
Admission: EM | Admit: 2012-04-28 | Discharge: 2012-04-28 | Disposition: A | Discharge status: Home / Self Care | Payer: Medicaid Other | Attending: Emergency Medicine | Admitting: Emergency Medicine

## 2012-04-28 DIAGNOSIS — R51 Headache: Secondary | ICD-10-CM | POA: Insufficient documentation

## 2012-04-28 DIAGNOSIS — R55 Syncope and collapse: Secondary | ICD-10-CM | POA: Insufficient documentation

## 2012-04-28 HISTORY — DX: Migraine, unspecified, not intractable, without status migrainosus: G43.909

## 2012-04-28 LAB — POCT I-STAT, CHEM 8
BUN: 13 mg/dL (ref 6–23)
Creatinine, Ser: 0.6 mg/dL (ref 0.50–1.10)
Glucose, Bld: 99 mg/dL (ref 70–99)
Hemoglobin: 13.6 g/dL (ref 12.0–15.0)

## 2012-04-28 MED ORDER — PROMETHAZINE HCL 25 MG/ML IJ SOLN
25.0000 mg | Freq: Once | INTRAMUSCULAR | Status: AC
  Administered 2012-04-28: 25 mg via INTRAVENOUS
  Filled 2012-04-28: qty 1

## 2012-04-28 MED ORDER — DROPERIDOL 2.5 MG/ML IJ SOLN
1.2500 mg | Freq: Once | INTRAMUSCULAR | Status: DC
  Filled 2012-04-28: qty 0.5

## 2012-04-28 MED ORDER — DIPHENHYDRAMINE HCL 50 MG/ML IJ SOLN
25.0000 mg | Freq: Once | INTRAMUSCULAR | Status: AC
  Administered 2012-04-28: 25 mg via INTRAVENOUS
  Filled 2012-04-28: qty 1

## 2012-04-28 MED ORDER — SODIUM CHLORIDE 0.9 % IV BOLUS (SEPSIS)
1000.0000 mL | Freq: Once | INTRAVENOUS | Status: AC
  Administered 2012-04-28: 1000 mL via INTRAVENOUS

## 2012-04-28 MED ORDER — METOCLOPRAMIDE HCL 5 MG/ML IJ SOLN
10.0000 mg | Freq: Once | INTRAMUSCULAR | Status: AC
  Administered 2012-04-28: 10 mg via INTRAVENOUS
  Filled 2012-04-28: qty 2

## 2012-04-28 MED ORDER — SODIUM CHLORIDE 0.9 % IV BOLUS (SEPSIS): 1000.0000 mL | Freq: Once | INTRAVENOUS | Status: AC

## 2012-04-28 MED ORDER — LORAZEPAM 2 MG/ML IJ SOLN
1.0000 mg | Freq: Once | INTRAMUSCULAR | Status: AC
  Administered 2012-04-28: 1 mg via INTRAVENOUS
  Filled 2012-04-28: qty 1

## 2012-04-28 MED ORDER — KETOROLAC TROMETHAMINE 30 MG/ML IJ SOLN
30.0000 mg | Freq: Once | INTRAMUSCULAR | Status: AC
  Administered 2012-04-28: 30 mg via INTRAVENOUS
  Filled 2012-04-28: qty 1

## 2012-04-28 NOTE — ED Notes (Signed)
Pt reports same type of migraine headache occured between "3-5 years ago", MD told pt she may have had "mini stroke"

## 2012-04-28 NOTE — ED Notes (Signed)
Per EMS: Pt reports progressive migraine x2 weeks. Treated at [facility] yesterday, lab work and ct scan preformed.  Given migraine cocktail which decreased pain. Prescribed phenobarital tabs, pt reports taking 1 tablet this morning, then lost consciousness. Last remembers "vacuuming house". Pt found unconscious on floor by family member. Unresponsive to sternum rub (3x), responded to ammonia. Reports migraine pain worse now

## 2012-04-28 NOTE — ED Provider Notes (Signed)
4:54 PM pt signed out to me at change of shift.  Plan for symptomatic control of headache.  Dr. Golda Acre states workup from syncope standpoint and other emergent causes of headache is complete.  She continues to have headache after toradol and phenergan- pain now 8/10.  Will continue hydration and given reglan/benadryl.    6:17 PM Pt with some improvement in headache.  Has been sleeping after meds.  Discharged with strict return precautions.   Ethelda Chick, MD 04/29/12 [phone]

## 2012-04-28 NOTE — ED Notes (Signed)
[id number]<AV> Expected date:04/28/12<BR> Expected time: 1:35 PM<BR> Means of arrival:Ambulance<BR> Comments:<BR> Migraine

## 2012-04-28 NOTE — ED Provider Notes (Addendum)
History     CSN: [id number]  Arrival date & time 04/28/12  1348   First MD Initiated Contact with Patient 04/28/12 1421      Chief Complaint  Patient presents with  . Migraine  . Loss of Consciousness    (Consider location/radiation/quality/duration/timing/severity/associated sxs/prior treatment) HPI Comments: Patient presents with 2 weeks of a migraine headache.  She states the last 3 days have been the worst.  She does note that this is similar to past migraine headaches for her.  This is not the worst headache of her life.  She was seen at [facility] emergency department yesterday and did have a CT scan completed which was negative.  Patient did receive Toradol, Reglan and Benadryl and Decadron with good relief of her headache at time of discharge.  This morning she woke up and had a similar headache.  She took the phenobarbital that her primary care physician prescribed for her for her headaches.  She notes that she was walking around her living room and believes she passed out which is where her daughter found her.  She denies any chest pain, difficulty breathing, fevers, nausea or vomiting.  She has no neck stiffness.  She does note similar migraine headaches in the past.  She's had syncopal episodes in the past as well.  There's been no other new changes in medicines and phenobarbital.  Patient is a 31 y.o. female presenting with migraine and syncope. The history is provided by the patient. No language interpreter was used.  Migraine This is a recurrent problem. The current episode started more than 1 week ago. The problem occurs constantly. Associated symptoms include headaches. Pertinent negatives include no chest pain, no abdominal pain and no shortness of breath.  Loss of Consciousness Associated symptoms include headaches. Pertinent negatives include no chest pain, no abdominal pain and no shortness of breath.    Past Medical History  Diagnosis Date  . Migraine     Past  Surgical History  Procedure Date  . Hand surgery   . Tubal ligation     History reviewed. No pertinent family history.  History  Substance Use Topics  . Smoking status: Never Smoker   . Smokeless tobacco: Not on file  . Alcohol Use: Yes     socially    OB History    Grav Para Term Preterm Abortions TAB SAB Ect Mult Living                  Review of Systems  Constitutional: Negative.  Negative for fever and chills.  Eyes: Positive for photophobia and visual disturbance. Negative for discharge.       Blurry vision  Respiratory: Negative.  Negative for cough and shortness of breath.   Cardiovascular: Positive for syncope. Negative for chest pain.  Gastrointestinal: Negative.  Negative for nausea, vomiting, abdominal pain and diarrhea.  Genitourinary: Negative.  Negative for dysuria.  Musculoskeletal: Negative.  Negative for back pain.  Skin: Negative.  Negative for color change and rash.  Neurological: Positive for syncope and headaches.  Hematological: Negative.  Negative for adenopathy.  Psychiatric/Behavioral: Negative.  Negative for confusion.  All other systems reviewed and are negative.    Allergies  Latex and Sulfa antibiotics  Home Medications   Current Outpatient Rx  Name Route Sig Dispense Refill  . ACETAMINOPHEN 325 MG PO TABS Oral Take 650 mg by mouth every 6 (six) hours as needed.    . ALPRAZOLAM 1 MG PO TABS Oral Take 1 mg  by mouth 3 (three) times daily as needed. As needed for anxiety.    Marland Kitchen PHENOBARBITAL 32.4 MG PO TABS Oral Take 32.4 mg by mouth 3 (three) times daily.    Marland Kitchen PHENTERMINE HCL 37.5 MG PO CAPS Oral Take 37.5 mg by mouth daily.      BP 112/71  Pulse 100  Temp 97.6 F (36.4 C) (Oral)  Resp 16  SpO2 97%  LMP 04/13/2012  Physical Exam  Nursing note and vitals reviewed. Constitutional: She is oriented to person, place, and time. She appears well-developed and well-nourished.  Non-toxic appearance. She does not have a sickly appearance.    HENT:  Head: Normocephalic and atraumatic.  Eyes: Conjunctivae, EOM and lids are normal. Pupils are equal, round, and reactive to light. No scleral icterus.  Neck: Trachea normal and normal range of motion. Neck supple.  Cardiovascular: Normal rate, regular rhythm and normal heart sounds.  Exam reveals no gallop.   No murmur heard. Pulmonary/Chest: Effort normal and breath sounds normal. No respiratory distress. She has no wheezes. She has no rales.  Abdominal: Soft. Normal appearance. There is no tenderness. There is no rebound, no guarding and no CVA tenderness.  Musculoskeletal: Normal range of motion. She exhibits no edema.  Neurological: She is alert and oriented to person, place, and time. She has normal strength.  Skin: Skin is warm, dry and intact. No rash noted.  Psychiatric: She has a normal mood and affect. Her behavior is normal. Judgment and thought content normal.    ED Course  Procedures (including critical care time)  Labs Reviewed - No data to display Ct Head Wo Contrast  04/27/2012  *RADIOLOGY REPORT*  Clinical Data:  Migraine headaches, dizziness  CT HEAD WITHOUT CONTRAST  Technique:  Contiguous axial images were obtained from the base of the skull through the vertex without contrast  Comparison:  10/04/2007  Findings:  The brain has a normal appearance without evidence for hemorrhage, acute infarction, hydrocephalus, or mass lesion.  There is no extra axial fluid collection.  The skull and paranasal sinuses are normal.  IMPRESSION: Normal CT of the head without contrast.  Original Report Authenticated By: Judie Petit. Ruel Favors, M.D.    Results for orders placed during the hospital encounter of 04/28/12  POCT I-STAT, CHEM 8      Component Value Range   Sodium 144  135 - 145 mEq/L   Potassium 3.8  3.5 - 5.1 mEq/L   Chloride 112  96 - 112 mEq/L   BUN 13  6 - 23 mg/dL   Creatinine, Ser 4.09  0.50 - 1.10 mg/dL   Glucose, Bld 99  70 - 99 mg/dL   Calcium, Ion 8.11  1.12 - 1.23  mmol/L   TCO2 21  0 - 100 mmol/L   Hemoglobin 13.6  12.0 - 15.0 g/dL   HCT 91.4  78.2 - 95.6 %    Date: 04/28/2012  Rate: 80  Rhythm: normal sinus rhythm  QRS Axis: normal  Intervals: normal  ST/T Wave abnormalities: normal  Conduction Disutrbances:none  Narrative Interpretation:   Old EKG Reviewed: unchanged from 10-05-07    MDM  Patient with a migraine headache that is similar to past migraine headaches for her.  She did have a CT yesterday which showed no acute pathology.  Patient has no fevers or next deafness to suggest meningitis.  Patient will receive medications here to attempt to help her with her headache control.  She does continue to follow the primary care physician  as well who placed her on phenobarbital which the patient has taken without symptom relief.  Patient did have a syncopal episode this morning but has a normal EKG and has had a tubal ligation so is unlikely to be pregnant.  I will check laboratory studies to look for signs of glucose abnormality or anemia.  If these are normal and the patient's headache is improved I believe she can be discharged home with continued outpatient followup with her primary care physician        Nat Christen, MD 04/28/12 1454  Nat Christen, MD 04/28/12 1539

## 2012-05-03 ENCOUNTER — Emergency Department (HOSPITAL_COMMUNITY)
Admission: EM | Admit: 2012-05-03 | Discharge: 2012-05-04 | Disposition: A | Discharge status: Home / Self Care | Payer: Medicaid Other | Source: Home / Self Care | Attending: Emergency Medicine | Admitting: Emergency Medicine

## 2012-05-03 DIAGNOSIS — R51 Headache: Secondary | ICD-10-CM

## 2012-05-03 DIAGNOSIS — H53149 Visual discomfort, unspecified: Secondary | ICD-10-CM | POA: Insufficient documentation

## 2012-05-03 DIAGNOSIS — Z882 Allergy status to sulfonamides status: Secondary | ICD-10-CM | POA: Insufficient documentation

## 2012-05-03 DIAGNOSIS — R110 Nausea: Secondary | ICD-10-CM | POA: Insufficient documentation

## 2012-05-03 NOTE — ED Notes (Signed)
Per EMS: pt coming from home with c/o headache. Pt states she feels like she is going to pass out. Pt reports nausea, photosensitivity, blurred vision. Pt has hx of similar ED visits at multiple ED's. Pt did not vomit en route. Pt is A&O x4

## 2012-05-04 ENCOUNTER — Encounter (HOSPITAL_COMMUNITY): Payer: Self-pay | Admitting: Emergency Medicine

## 2012-05-04 ENCOUNTER — Emergency Department (HOSPITAL_COMMUNITY): Payer: Medicaid Other

## 2012-05-04 ENCOUNTER — Emergency Department (HOSPITAL_COMMUNITY)
Admission: EM | Admit: 2012-05-04 | Discharge: 2012-05-04 | Disposition: A | Discharge status: Home / Self Care | Payer: Medicaid Other | Attending: Emergency Medicine | Admitting: Emergency Medicine

## 2012-05-04 ENCOUNTER — Encounter (HOSPITAL_COMMUNITY): Payer: Self-pay | Admitting: *Deleted

## 2012-05-04 DIAGNOSIS — R51 Headache: Secondary | ICD-10-CM

## 2012-05-04 LAB — CBC WITH DIFFERENTIAL/PLATELET
Basophils Absolute: 0 10*3/uL (ref 0.0–0.1)
Basophils Relative: 0 % (ref 0–1)
Eosinophils Absolute: 0.3 10*3/uL (ref 0.0–0.7)
Eosinophils Relative: 2 % (ref 0–5)
HCT: 43.3 % (ref 36.0–46.0)
MCHC: 33.5 g/dL (ref 30.0–36.0)
MCV: 79.2 fL (ref 78.0–100.0)
Monocytes Absolute: 0.8 10*3/uL (ref 0.1–1.0)
RDW: 13.6 % (ref 11.5–15.5)

## 2012-05-04 LAB — COMPREHENSIVE METABOLIC PANEL
AST: 22 U/L (ref 0–37)
Albumin: 3.6 g/dL (ref 3.5–5.2)
Calcium: 9.6 mg/dL (ref 8.4–10.5)
Creatinine, Ser: 0.67 mg/dL (ref 0.50–1.10)
GFR calc non Af Amer: >90 mL/min (ref >90)

## 2012-05-04 MED ORDER — KETOROLAC TROMETHAMINE 30 MG/ML IJ SOLN
30.0000 mg | Freq: Once | INTRAMUSCULAR | Status: AC
  Administered 2012-05-04: 30 mg via INTRAVENOUS
  Filled 2012-05-04: qty 1

## 2012-05-04 MED ORDER — DIPHENHYDRAMINE HCL 50 MG/ML IJ SOLN
12.5000 mg | Freq: Once | INTRAMUSCULAR | Status: AC
  Administered 2012-05-04: 12.5 mg via INTRAVENOUS
  Filled 2012-05-04: qty 1

## 2012-05-04 MED ORDER — METOCLOPRAMIDE HCL 5 MG/ML IJ SOLN
10.0000 mg | Freq: Once | INTRAMUSCULAR | Status: AC
  Administered 2012-05-04: 10 mg via INTRAVENOUS
  Filled 2012-05-04: qty 2

## 2012-05-04 MED ORDER — ONDANSETRON HCL 4 MG/2ML IJ SOLN
4.0000 mg | Freq: Once | INTRAMUSCULAR | Status: AC
  Administered 2012-05-04: 4 mg via INTRAVENOUS
  Filled 2012-05-04: qty 2

## 2012-05-04 MED ORDER — DIPHENHYDRAMINE HCL 50 MG/ML IJ SOLN
25.0000 mg | Freq: Once | INTRAMUSCULAR | Status: AC
  Administered 2012-05-04: 25 mg via INTRAVENOUS
  Filled 2012-05-04: qty 1

## 2012-05-04 MED ORDER — HYDROMORPHONE HCL PF 1 MG/ML IJ SOLN
1.0000 mg | Freq: Once | INTRAMUSCULAR | Status: AC
  Administered 2012-05-04: 1 mg via INTRAVENOUS
  Filled 2012-05-04: qty 1

## 2012-05-04 MED ORDER — SODIUM CHLORIDE 0.9 % IV SOLN
Freq: Once | INTRAVENOUS | Status: AC
  Administered 2012-05-04: 03:00:00 via INTRAVENOUS

## 2012-05-04 MED ORDER — FENTANYL CITRATE 0.05 MG/ML IJ SOLN
100.0000 ug | Freq: Once | INTRAMUSCULAR | Status: AC
  Administered 2012-05-04: 100 ug via INTRAVENOUS
  Filled 2012-05-04: qty 2

## 2012-05-04 MED ORDER — SODIUM CHLORIDE 0.9 % IV SOLN
Freq: Once | INTRAVENOUS | Status: AC
  Administered 2012-05-04: 21:00:00 via INTRAVENOUS

## 2012-05-04 MED ORDER — MELOXICAM 7.5 MG PO TABS: 7.5000 mg | ORAL_TABLET | Freq: Every day | ORAL | Status: DC

## 2012-05-04 NOTE — ED Provider Notes (Signed)
History     CSN: [id number]  Arrival date & time 05/04/12  7829   First MD Initiated Contact with Patient 05/04/12 1959      Chief Complaint  Patient presents with  . Headache    (Consider location/radiation/quality/duration/timing/severity/associated sxs/prior treatment) HPI Patient is brought to ER by EMS with complaint of waking this morning with recurrent HA stating that HA has been persistent and worsening since waking with associated blurry vision, n/v, photophobia and syncope stating that she was found down in the bathroom by her children after syncopal event of unknown duration. Patient has had similar symptoms with HA for the last 2-3 weeks. According to the patient she has been seen at multiple different hospitals recently for the same complaint. She states that she does not usually get headaches like this however she did have similar circumstances 5 years ago where she had similar headaches with syncopal episodes. This week she has had approximately 2-3 weeks of intermittent but increasing in frequency headaches which are throbbing, pulsating, frontal and posterior in location. They are associated with photophobia and syncopal events. The patient states that these occur when she has her headache, there is no chest pain, palpitations, weakness, numbness, ataxia. She has been taking Percocet and Valium which her family doctor prescribed which have not helped her pain. She states she has felt feverish today but denies stiff neck,  rashes,  abdominal pain,  dysuria,  diarrhea. She has had a CT scan that was performed recently which was totally normal per radiology. Patient states that other than mild anxiety that she takes occasional xanax she has no other known medical problems and takes no other medications on regular basis other than recent medications prescribed by PCP and ER's for headaches. Patient states she called neurolgoy follow up but will not be seen for at least 3 weeks.     Past Medical History  Diagnosis Date  . Migraine     Past Surgical History  Procedure Date  . Hand surgery   . Tubal ligation     No family history on file.  History  Substance Use Topics  . Smoking status: Never Smoker   . Smokeless tobacco: Not on file  . Alcohol Use: Yes     socially    OB History    Grav Para Term Preterm Abortions TAB SAB Ect Mult Living                  Review of Systems  All other systems reviewed and are negative.    Allergies  Latex and Sulfa antibiotics  Home Medications   Current Outpatient Rx  Name Route Sig Dispense Refill  . ACETAMINOPHEN 325 MG PO TABS Oral Take 650 mg by mouth every 6 (six) hours as needed.    . ALPRAZOLAM 1 MG PO TABS Oral Take 1 mg by mouth 3 (three) times daily as needed. As needed for anxiety.    . MELOXICAM 7.5 MG PO TABS Oral Take 1 tablet (7.5 mg total) by mouth daily. 30 tablet 0  . PHENOBARBITAL 32.4 MG PO TABS Oral Take 32.4 mg by mouth 3 (three) times daily.    Marland Kitchen PHENTERMINE HCL 37.5 MG PO CAPS Oral Take 37.5 mg by mouth daily.      BP 138/80  Pulse 102  Temp 98.4 F (36.9 C) (Oral)  Resp 21  SpO2 99%  LMP 04/13/2012  Physical Exam  Nursing note and vitals reviewed. Constitutional: She is oriented to person, place, and  time. She appears well-developed and well-nourished. No distress.  HENT:  Head: Normocephalic and atraumatic.  Eyes: Conjunctivae and EOM are normal. Pupils are equal, round, and reactive to light.  Neck: Normal range of motion. Neck supple.       No meningeal signs.   Cardiovascular: Normal rate, regular rhythm, normal heart sounds and intact distal pulses.  Exam reveals no gallop and no friction rub.   No murmur heard. Pulmonary/Chest: Effort normal and breath sounds normal. No respiratory distress. She has no wheezes. She has no rales. She exhibits no tenderness.  Abdominal: Bowel sounds are normal. She exhibits no distension and no mass. There is no tenderness. There  is no rebound and no guarding.  Musculoskeletal: Normal range of motion. She exhibits no edema and no tenderness.  Neurological: She is alert and oriented to person, place, and time.  Skin: Skin is warm and dry. No rash noted. She is not diaphoretic. No erythema.  Psychiatric: She has a normal mood and affect.    ED Course  Procedures (including critical care time)  IV reglan, benadryl, fentanyl Labs Reviewed  CBC WITH DIFFERENTIAL - Abnormal; Notable for the following:    WBC 14.4 (*)     RBC 5.47 (*)     Neutro Abs 10.3 (*)     All other components within normal limits  COMPREHENSIVE METABOLIC PANEL - Abnormal; Notable for the following:    ALT 38 (*)     All other components within normal limits  POCT PREGNANCY, URINE  CSF CELL COUNT WITH DIFFERENTIAL  PROTEIN AND GLUCOSE, CSF  CSF CULTURE  GRAM STAIN   No results found.   1. Headache      Given patient's habitus, we have ordered an OP fluero guided LP for in the morning to rule out pseudo tumor. She is afebrile and is not meningeal with at least a month of headaches similar to headaches and syncope 5 years ago. She had normal CT scan recently. At this time there are no red flags for headache but we will aid in continuing workup.  MDM  Assessment and plan agreeable with Dr. Weldon Inches who saw patient at bedside. We have treated patient's pain throughout ER stay.         [place], [place] 05/04/12 2326

## 2012-05-04 NOTE — ED Provider Notes (Signed)
Medical screening examination/treatment/procedure(s) were conducted as a shared visit with non-physician practitioner(s) and myself.  I personally evaluated the patient during the encounter Ha for several weeks.  + photophobia.   Has "fainted" 3 times.  Has been seen in ed multiple times. Ct neg.   She says "they just dope me up and let me go."   She does not want to be doped up.  Neuro exam nl.  No nuchal rigidity. No rash or fever.   Pt is morbidly obese.  Will arrange fluorocopic lp tomorrow to check for pseudotumor  Cheri Guppy, MD 05/04/12 2300

## 2012-05-04 NOTE — ED Provider Notes (Signed)
History     CSN: [id number]  Arrival date & time 05/03/12  2132   First MD Initiated Contact with Patient 05/04/12 0036      Chief Complaint  Patient presents with  . Headache  . Nausea    (Consider location/radiation/quality/duration/timing/severity/associated sxs/prior treatment) HPI Comments: [age]-year-old female with a history of headaches for the last 2 weeks. According to the patient she has been seen at multiple different hospitals this week for the same complaint. She states that she does not usually get headaches like this however she did have similar circumstances 5 years ago where she had similar headaches with syncopal episodes. This week she has had approximately 2 weeks of intermittent but increasing in frequency headaches which are throbbing, pulsating, frontal and posterior in location. They are associated with photophobia and has had 2 syncopal events. The patient states that these occur when she has her headache, there is no chest pain, palpitations, weakness, numbness, ataxia. She has been taking Percocet and Valium which her family doctor prescribed today which have not helped her pain. She denies fevers in the last week, no stiff neck, no rashes, no abdominal pain, no dysuria, no diarrhea. She has had a CT scan that was performed at [facility] one week ago which was totally normal per radiology.  Patient is a 31 y.o. female presenting with headaches. The history is provided by the patient and medical records.  Headache     Past Medical History  Diagnosis Date  . Migraine     Past Surgical History  Procedure Date  . Hand surgery   . Tubal ligation     History reviewed. No pertinent family history.  History  Substance Use Topics  . Smoking status: Never Smoker   . Smokeless tobacco: Not on file  . Alcohol Use: Yes     socially    OB History    Grav Para Term Preterm Abortions TAB SAB Ect Mult Living                  Review of Systems    Neurological: Positive for headaches.  All other systems reviewed and are negative.    Allergies  Latex and Sulfa antibiotics  Home Medications   Current Outpatient Rx  Name Route Sig Dispense Refill  . ACETAMINOPHEN 325 MG PO TABS Oral Take 650 mg by mouth every 6 (six) hours as needed.    . ALPRAZOLAM 1 MG PO TABS Oral Take 1 mg by mouth 3 (three) times daily as needed. As needed for anxiety.    Marland Kitchen PHENOBARBITAL 32.4 MG PO TABS Oral Take 32.4 mg by mouth 3 (three) times daily.    Marland Kitchen PHENTERMINE HCL 37.5 MG PO CAPS Oral Take 37.5 mg by mouth daily.    . MELOXICAM 7.5 MG PO TABS Oral Take 1 tablet (7.5 mg total) by mouth daily. 30 tablet 0    BP 115/65  Pulse 78  Temp 97.6 F (36.4 C)  SpO2 99%  LMP 04/13/2012  Physical Exam  Nursing note and vitals reviewed. Constitutional: She appears well-developed and well-nourished. No distress.  HENT:  Head: Normocephalic and atraumatic.  Mouth/Throat: Oropharynx is clear and moist. No oropharyngeal exudate.  Eyes: Conjunctivae and EOM are normal. Pupils are equal, round, and reactive to light. Right eye exhibits no discharge. Left eye exhibits no discharge. No scleral icterus.  Neck: Normal range of motion. Neck supple. No JVD present. No thyromegaly present.  Cardiovascular: Normal rate, regular rhythm, normal heart sounds and  intact distal pulses.  Exam reveals no gallop and no friction rub.   No murmur heard. Pulmonary/Chest: Effort normal and breath sounds normal. No respiratory distress. She has no wheezes. She has no rales.  Abdominal: Soft. Bowel sounds are normal. She exhibits no distension and no mass. There is no tenderness.  Musculoskeletal: Normal range of motion. She exhibits no edema and no tenderness.  Lymphadenopathy:    She has no cervical adenopathy.  Neurological:       Neurologic exam:  Speech clear, pupils equal round reactive to light, extraocular movements intact  Normal peripheral visual fields Cranial  nerves III through XII normal including no facial droop Follows commands, moves all extremities x4, normal strength to bilateral upper and lower extremities at all major muscle groups including grip Sensation normal to light touch and pinprick Coordination intact, no limb ataxia, finger-nose-finger normal Rapid alternating movements normal No pronator drift Gait normal   Skin: Skin is warm and dry. No rash noted. No erythema.  Psychiatric: She has a normal mood and affect. Her behavior is normal.    ED Course  Procedures (including critical care time)  Labs Reviewed - No data to display No results found.   1. Headache       MDM  There is no signs of focal neurologic deficits nor is there fever, stiff neck. She's had a normal CT scan 1 week ago. We'll proceed with medications. She likely needs a neurology followup for these headaches.    After pain medication the patient states that her pain level is at 2/10, she states that she feels much better. Will prescribe meloxicam, have patient followup with neurology.      Vida Roller, MD 05/04/12 [phone]

## 2012-05-04 NOTE — ED Notes (Signed)
Pt c/o headache with photophobia, nausea. Feels like she is going to pass out. Pt has been seen multiple times at multiple EDs for same complaint.

## 2012-05-04 NOTE — ED Notes (Signed)
BJY:[id number]<FA> Expected date:<BR> Expected time:<BR> Means of arrival:<BR> Comments:<BR> Hold for Abby

## 2012-05-04 NOTE — ED Notes (Signed)
Pt states she is nauseous and has fainted and passed out.

## 2012-05-05 ENCOUNTER — Encounter (HOSPITAL_COMMUNITY): Payer: Self-pay | Admitting: *Deleted

## 2012-05-05 ENCOUNTER — Emergency Department (HOSPITAL_COMMUNITY)
Admission: EM | Admit: 2012-05-05 | Discharge: 2012-05-05 | Disposition: A | Discharge status: Home / Self Care | Payer: Medicaid Other | Attending: Emergency Medicine | Admitting: Emergency Medicine

## 2012-05-05 ENCOUNTER — Inpatient Hospital Stay (HOSPITAL_COMMUNITY)
Admission: RE | Admit: 2012-05-05 | Discharge: 2012-05-05 | Disposition: A | Discharge status: Home / Self Care | Payer: Medicaid Other | Source: Ambulatory Visit

## 2012-05-05 ENCOUNTER — Ambulatory Visit (HOSPITAL_COMMUNITY)
Admission: RE | Admit: 2012-05-05 | Discharge: 2012-05-05 | Disposition: A | Discharge status: Home / Self Care | Payer: Medicaid Other | Source: Ambulatory Visit | Attending: Emergency Medicine | Admitting: Emergency Medicine

## 2012-05-05 DIAGNOSIS — Z79899 Other long term (current) drug therapy: Secondary | ICD-10-CM | POA: Insufficient documentation

## 2012-05-05 DIAGNOSIS — R51 Headache: Secondary | ICD-10-CM

## 2012-05-05 LAB — GRAM STAIN

## 2012-05-05 LAB — CSF CELL COUNT WITH DIFFERENTIAL
RBC Count, CSF: 4590 /mm3 — ABNORMAL HIGH
Tube #: 4

## 2012-05-05 MED ORDER — KETOROLAC TROMETHAMINE 15 MG/ML IJ SOLN
15.0000 mg | Freq: Once | INTRAMUSCULAR | Status: AC
  Administered 2012-05-05: 15 mg via INTRAVENOUS
  Filled 2012-05-05: qty 1

## 2012-05-05 MED ORDER — METOCLOPRAMIDE HCL 5 MG/ML IJ SOLN
10.0000 mg | Freq: Once | INTRAMUSCULAR | Status: AC
  Administered 2012-05-05: 10 mg via INTRAVENOUS
  Filled 2012-05-05 (×2): qty 2

## 2012-05-05 MED ORDER — SODIUM CHLORIDE 0.9 % IV BOLUS (SEPSIS)
1000.0000 mL | Freq: Once | INTRAVENOUS | Status: AC
  Administered 2012-05-05: 1000 mL via INTRAVENOUS

## 2012-05-05 MED ORDER — ACETAMINOPHEN 500 MG PO TABS: 1000.0000 mg | ORAL_TABLET | Freq: Four times a day (QID) | ORAL | Status: DC | PRN

## 2012-05-05 MED ORDER — HYDROMORPHONE HCL PF 1 MG/ML IJ SOLN
0.5000 mg | Freq: Once | INTRAMUSCULAR | Status: AC
  Administered 2012-05-05: 0.5 mg via INTRAVENOUS
  Filled 2012-05-05: qty 1

## 2012-05-05 MED ORDER — DIPHENHYDRAMINE HCL 50 MG/ML IJ SOLN
25.0000 mg | Freq: Once | INTRAMUSCULAR | Status: AC
  Administered 2012-05-05: 16:00:00 via INTRAVENOUS
  Filled 2012-05-05: qty 1

## 2012-05-05 NOTE — Progress Notes (Signed)
Pt sobbing and still c/o of headache . Spoke with Dr Kearney Hard in Radiology and pt is to go back to ED

## 2012-05-05 NOTE — ED Notes (Signed)
Radiology called Charge RN requesting order for LP. Lab orders for CSF and gram stain already entered by Caporossi, EDP last night. LP order entered based on lab orders with "reason for exam" of HA and r/o pseudotumor.

## 2012-05-05 NOTE — ED Notes (Signed)
Pt brought from [facility] post LP, had LP done d/t having h/a x 3 weeks"; pt states "I've had the this h/a x 3 weeks, I've passed out because of this h/a, it's a million on a scale of 10, nobody believes my h/a is this bad,I used to have migraines about 5 yrs ago, have an appt with Elise Benne on 8/15, can be seen in the morning @ 0730"; pt c/o photophobia nausea but denies vomiting recently, spouse & son remains @ [facility].

## 2012-05-05 NOTE — Procedures (Signed)
LP under fluoro guidance at L3/4. Opening pressure 19 cm h2o. blood tinged fluid slowly clears compatible with bloody tap.

## 2012-05-05 NOTE — Progress Notes (Signed)
Offered pt Tylenol for her headache as ordered and pt declined. States that does nothing for her headaches.Placed call to radiology for Dr Kearney Hard for additional orders and also discharge orders post lumbar puncture

## 2012-05-05 NOTE — ED Provider Notes (Signed)
Medical screening examination/treatment/procedure(s) were conducted as a shared visit with non-physician practitioner(s) and myself.  I personally evaluated the patient during the encounter  Maurisha Mongeau, MD 05/05/12 0011 

## 2012-05-05 NOTE — ED Notes (Signed)
Bed:WHALA<BR> Expected date:<BR> Expected time:<BR> Means of arrival:<BR> Comments:<BR> Hold for Short Stay

## 2012-05-05 NOTE — ED Notes (Signed)
MD at bedside. 

## 2012-05-05 NOTE — Progress Notes (Signed)
Pt arrived to Short Stay post lumbar puncture for recovery. Warm and dry. States she has a headache but it feels the same as it has the last 10 days and gets progressively worse as the day goes on. States it is in her forehead radiation to back of head.At this time it is 10/10 and "never gets any better" states she "has been to the ED the last several days because of it" Pt is currently flat in bed with lights off.

## 2012-05-05 NOTE — ED Provider Notes (Signed)
History    31yf with HA. Numerous recent evaluations for same. Referred from IR because continuing to have HA post LP. LP done to eval for pseudotumor. Notes reviewed. Opening pressure 19. Pt states gradual onset of HA about 3w ago. Initially intermittent but now constant for past almost 2 weeks. B/l frontal region. Pulsating. Associated with photophobia blurred vision. Also states "I've passed out 3 or 4 times," but not since last evaluation. Denies trauma. No fever. No neck stiffness. Pt states she has neurology appointment tomorrow morning at 0730. "Just dope me up again so I feel better and maybe tomorrow someone can tell me what it wrong with me."    CSN: [id number]  Arrival date & time 05/05/12  1440   First MD Initiated Contact with Patient 05/05/12 1459      Chief Complaint  Patient presents with  . Headache    (Consider location/radiation/quality/duration/timing/severity/associated sxs/prior treatment) HPI  Past Medical History  Diagnosis Date  . Migraine     Past Surgical History  Procedure Date  . Hand surgery   . Tubal ligation     No family history on file.  History  Substance Use Topics  . Smoking status: Never Smoker   . Smokeless tobacco: Not on file  . Alcohol Use: Yes     socially    OB History    Grav Para Term Preterm Abortions TAB SAB Ect Mult Living                  Review of Systems   Review of symptoms negative unless otherwise noted in HPI.   Allergies  Latex and Sulfa antibiotics  Home Medications   Current Outpatient Rx  Name Route Sig Dispense Refill  . ACETAMINOPHEN 325 MG PO TABS Oral Take 650 mg by mouth every 6 (six) hours as needed.    . ALPRAZOLAM 1 MG PO TABS Oral Take 1 mg by mouth 3 (three) times daily as needed. As needed for anxiety.    Marland Kitchen DIAZEPAM 10 MG PO TABS Oral Take 10 mg by mouth every 8 (eight) hours as needed. Migraines.    . IBUPROFEN 200 MG PO TABS Oral Take 200 mg by mouth every 6 (six) hours as needed.  Headache.    Marland Kitchen KETOROLAC TROMETHAMINE 10 MG PO TABS Oral Take 10 mg by mouth every 6 (six) hours as needed. Pain.    . MELOXICAM 7.5 MG PO TABS Oral Take 7.5 mg by mouth daily.    Marland Kitchen PHENOBARBITAL 32.4 MG PO TABS Oral Take 32.4 mg by mouth 3 (three) times daily.    Marland Kitchen PHENTERMINE HCL 37.5 MG PO CAPS Oral Take 37.5 mg by mouth daily.    Marland Kitchen PRESCRIPTION MEDICATION Oral Take 1 tablet by mouth daily as needed. Nausea. Orally dissolving prescription nausea medication.    . IMITREX PO Oral Take 1 tablet by mouth 2 (two) times daily. Take 1 tablet at onset of headache. May repeat in 2 hours if needed.  Max  2 tablets in 24 hours.      BP 126/76  Pulse 98  Temp 98.3 F (36.8 C) (Oral)  Resp 18  Wt 250 lb (113.399 kg)  SpO2 100%  LMP 04/13/2012  Physical Exam  Nursing note and vitals reviewed. Constitutional: She is oriented to person, place, and time. She appears well-developed and well-nourished. No distress.       Laying in bed. NAD. Obese.  HENT:  Head: Normocephalic and atraumatic.  Eyes: Conjunctivae and EOM  are normal. Pupils are equal, round, and reactive to light. Right eye exhibits no discharge. Left eye exhibits no discharge.  Neck: Normal range of motion. Neck supple.       No nuchal rigidity  Cardiovascular: Normal rate, regular rhythm and normal heart sounds.  Exam reveals no gallop and no friction rub.   No murmur heard. Pulmonary/Chest: Effort normal and breath sounds normal. No respiratory distress.  Abdominal: Soft. She exhibits no distension. There is no tenderness.  Musculoskeletal: She exhibits no edema and no tenderness.  Neurological: She is alert and oriented to person, place, and time. No cranial nerve deficit. She exhibits normal muscle tone. Coordination normal.       Good finger to nose b/l  Skin: Skin is warm and dry.  Psychiatric: She has a normal mood and affect. Her behavior is normal. Thought content normal.    ED Course  Procedures (including critical care  time)  Labs Reviewed - No data to display Dg Lumbar Puncture Fluoro Guide  05/05/2012  *RADIOLOGY REPORT*  Clinical Data:  Headache.  DIAGNOSTIC LUMBAR PUNCTURE UNDER FLUOROSCOPIC GUIDANCE  Fluoroscopy time:  0.5 minutes.  Technique:  Informed consent was obtained from the patient prior to the procedure, including potential complications of headache, allergy, and pain.   With the patient prone, the lower back was prepped with Betadine.  1% Lidocaine was used for local anesthesia. Lumbar puncture was performed at the L3-4 level using a 20 gauge needle with return of blood tinged CSF with an opening pressure of 19 cm water. The CSF slowly cleared compatible with bloody tap.  8 ml of CSF were obtained for laboratory studies.  The patient tolerated the procedure well and there were no apparent complications.  IMPRESSION: Technically successful lumbar puncture with fluoroscopic guidance as described above.  Original Report Authenticated By: Cyndie Chime, M.D.     1. Headache       MDM  31yF with headache. Etiology unclear. Nonfocal neuro exam. Recent normal head CT. Opening pressure normal. LP results reviewed. 1 WBC. Normal glucose and protein. Many red cells but comment of bloody tap. Pt has appointment with neurology tomorrow morning. Feel she is appropriate for f/u then. Low suspicion for emergent etiology.        Raeford Razor, MD 05/07/12 [phone]

## 2012-05-09 LAB — CSF CULTURE W GRAM STAIN
Culture: NO GROWTH
Gram Stain: NONE SEEN

## 2012-05-19 ENCOUNTER — Encounter (HOSPITAL_COMMUNITY): Payer: Self-pay

## 2012-05-19 ENCOUNTER — Emergency Department (HOSPITAL_COMMUNITY): Payer: Medicaid Other

## 2012-05-19 ENCOUNTER — Emergency Department (HOSPITAL_COMMUNITY)
Admission: EM | Admit: 2012-05-19 | Discharge: 2012-05-20 | Disposition: A | Discharge status: Home / Self Care | Payer: Medicaid Other | Attending: Emergency Medicine | Admitting: Emergency Medicine

## 2012-05-19 DIAGNOSIS — R51 Headache: Secondary | ICD-10-CM | POA: Insufficient documentation

## 2012-05-19 DIAGNOSIS — R55 Syncope and collapse: Secondary | ICD-10-CM | POA: Insufficient documentation

## 2012-05-19 LAB — ETHANOL: Alcohol, Ethyl (B): <11 mg/dL (ref 0–11)

## 2012-05-19 LAB — CBC WITH DIFFERENTIAL/PLATELET
Eosinophils Relative: 1 % (ref 0–5)
HCT: 40.7 % (ref 36.0–46.0)
Lymphocytes Relative: 21 % (ref 12–46)
Lymphs Abs: 2.7 10*3/uL (ref 0.7–4.0)
MCV: 79 fL (ref 78.0–100.0)
Monocytes Absolute: 0.8 10*3/uL (ref 0.1–1.0)
RBC: 5.15 MIL/uL — ABNORMAL HIGH (ref 3.87–5.11)
RDW: 13.4 % (ref 11.5–15.5)
WBC: 12.9 10*3/uL — ABNORMAL HIGH (ref 4.0–10.5)

## 2012-05-19 LAB — BASIC METABOLIC PANEL
BUN: 10 mg/dL (ref 6–23)
CO2: 24 mEq/L (ref 19–32)
Calcium: 9.3 mg/dL (ref 8.4–10.5)
Creatinine, Ser: 0.77 mg/dL (ref 0.50–1.10)
Glucose, Bld: 96 mg/dL (ref 70–99)

## 2012-05-19 LAB — URINALYSIS, ROUTINE W REFLEX MICROSCOPIC
Glucose, UA: NEGATIVE mg/dL
Hgb urine dipstick: NEGATIVE
Ketones, ur: NEGATIVE mg/dL
Protein, ur: NEGATIVE mg/dL

## 2012-05-19 LAB — PHENOBARBITAL LEVEL: Phenobarbital: <2.4 ug/mL — ABNORMAL LOW (ref 15.0–40.0)

## 2012-05-19 MED ORDER — KETOROLAC TROMETHAMINE 30 MG/ML IJ SOLN
30.0000 mg | Freq: Once | INTRAMUSCULAR | Status: AC
  Administered 2012-05-19: 30 mg via INTRAVENOUS
  Filled 2012-05-19: qty 1

## 2012-05-19 MED ORDER — METOCLOPRAMIDE HCL 5 MG/ML IJ SOLN
10.0000 mg | Freq: Once | INTRAMUSCULAR | Status: AC
  Administered 2012-05-19: 10 mg via INTRAVENOUS
  Filled 2012-05-19: qty 2

## 2012-05-19 MED ORDER — SODIUM CHLORIDE 0.9 % IV BOLUS (SEPSIS)
1000.0000 mL | Freq: Once | INTRAVENOUS | Status: AC
  Administered 2012-05-19: 1000 mL via INTRAVENOUS

## 2012-05-19 NOTE — ED Notes (Signed)
C/o headache x 1 month. Was seen at [facility] ED, [facility] ED and [facility] ED. Has had CT scans and spinal tap. All unremarkable. Patient reports that she had a syncopal episode earlier today and fell. Has a small hematoma to forehead. No bleeding. Also reports nausea, vomiting and diarrhea x 1 day. Pt aaox4, resp e/u, nad. ECG unremarkable. VSS

## 2012-05-19 NOTE — ED Provider Notes (Signed)
History     CSN: [id number]  Arrival date & time 05/19/12  2045   First MD Initiated Contact with Patient 05/19/12 2122      Chief Complaint  Patient presents with  . Migraine    (Consider location/radiation/quality/duration/timing/severity/associated sxs/prior treatment) HPI Pt with chronic headaches, seen numerous times in the last 6 weeks for same. States she had a fainting spell at home earlier this evening, hitting her head on the ground. Similar to prior episodes. She states medications prescribed by her neurologist have not been helping her pain.   Past Medical History  Diagnosis Date  . Migraine     Past Surgical History  Procedure Date  . Hand surgery   . Tubal ligation     No family history on file.  History  Substance Use Topics  . Smoking status: Never Smoker   . Smokeless tobacco: Not on file  . Alcohol Use: Yes     socially    OB History    Grav Para Term Preterm Abortions TAB SAB Ect Mult Living                  Review of Systems All other systems reviewed and are negative except as noted in HPI.   Allergies  Latex; Sulfa antibiotics; and Zomig  Home Medications   Current Outpatient Rx  Name Route Sig Dispense Refill  . ALPRAZOLAM 1 MG PO TABS Oral Take 1 mg by mouth 3 (three) times daily as needed. As needed for anxiety.    Marland Kitchen DIAZEPAM 10 MG PO TABS Oral Take 5 mg by mouth every 8 (eight) hours as needed. Migraines.    . TOPIRAMATE 25 MG PO TABS Oral Take 25 mg by mouth 2 (two) times daily.      BP 128/89  Pulse 74  Temp 97.4 F (36.3 C) (Oral)  Resp 16  SpO2 100%  LMP 04/13/2012  Physical Exam  Nursing note and vitals reviewed. Constitutional: She is oriented to person, place, and time. She appears well-developed and well-nourished.  HENT:  Head: Normocephalic.       Forehead contusion  Eyes: EOM are normal. Pupils are equal, round, and reactive to light.  Neck: Normal range of motion. Neck supple.  Cardiovascular: Normal  rate, normal heart sounds and intact distal pulses.   Pulmonary/Chest: Effort normal and breath sounds normal.  Abdominal: Bowel sounds are normal. She exhibits no distension. There is no tenderness.  Musculoskeletal: Normal range of motion. She exhibits no edema and no tenderness.  Neurological: She is alert and oriented to person, place, and time. She has normal strength. No cranial nerve deficit or sensory deficit.  Skin: Skin is warm and dry. No rash noted.  Psychiatric: She has a normal mood and affect.    ED Course  Procedures (including critical care time)  Labs Reviewed  CBC WITH DIFFERENTIAL - Abnormal; Notable for the following:    WBC 12.9 (*)     RBC 5.15 (*)     Platelets 428 (*)     Neutro Abs 9.3 (*)     All other components within normal limits  PHENOBARBITAL LEVEL - Abnormal; Notable for the following:    Phenobarbital <2.4 (*)     All other components within normal limits  BASIC METABOLIC PANEL  ETHANOL  URINALYSIS, ROUTINE W REFLEX MICROSCOPIC  URINE RAPID DRUG SCREEN (HOSP PERFORMED)   Ct Head Wo Contrast  05/19/2012  *RADIOLOGY REPORT*  Clinical Data: Migraine headaches.  Syncopal event.  CT HEAD WITHOUT CONTRAST  Technique:  Contiguous axial images were obtained from the base of the skull through the vertex without contrast.  Comparison: 04/27/2012.  Findings: No mass lesion, mass effect, midline shift, hydrocephalus, hemorrhage.  No territorial ischemia or acute infarction.  Mastoid air cells and paranasal sinuses clear.  IMPRESSION: Negative CT head.  Original Report Authenticated By: Andreas Newport, M.D.     No diagnosis found.    MDM  Pt's feeling better with meds. Awaiting Urine tests. Headache is chronic.         Charles B. Bernette Mayers, MD 05/21/12 [phone]

## 2012-05-20 LAB — RAPID URINE DRUG SCREEN, HOSP PERFORMED
Amphetamines: NOT DETECTED
Barbiturates: NOT DETECTED
Benzodiazepines: POSITIVE — AB

## 2012-05-20 MED ORDER — DIPHENHYDRAMINE HCL 50 MG/ML IJ SOLN
25.0000 mg | Freq: Once | INTRAMUSCULAR | Status: AC
  Administered 2012-05-20: 25 mg via INTRAVENOUS
  Filled 2012-05-20: qty 1

## 2012-05-20 MED ORDER — DEXAMETHASONE SODIUM PHOSPHATE 10 MG/ML IJ SOLN
10.0000 mg | Freq: Once | INTRAMUSCULAR | Status: AC
  Administered 2012-05-20: 10 mg via INTRAVENOUS
  Filled 2012-05-20: qty 1

## 2012-05-27 ENCOUNTER — Ambulatory Visit (HOSPITAL_COMMUNITY)
Admission: RE | Admit: 2012-05-27 | Discharge: 2012-05-27 | Disposition: A | Discharge status: Home / Self Care | Payer: Medicaid Other | Source: Ambulatory Visit | Attending: Family Medicine | Admitting: Family Medicine

## 2012-05-27 DIAGNOSIS — R404 Transient alteration of awareness: Secondary | ICD-10-CM | POA: Insufficient documentation

## 2012-05-27 DIAGNOSIS — Z1389 Encounter for screening for other disorder: Secondary | ICD-10-CM | POA: Insufficient documentation

## 2012-05-27 DIAGNOSIS — R55 Syncope and collapse: Secondary | ICD-10-CM

## 2012-05-27 NOTE — Progress Notes (Signed)
Routine EEG completed as outpatient.

## 2012-05-28 NOTE — Procedures (Signed)
EEG NUMBER:  [id number].  REFERRING PHYSICIAN:  Francis P. Modesto Charon, M.D.  INDICATION FOR STUDY:  A 31 year old lady with recurrent episodes of loss of consciousness of unclear etiology.  No seizure activity has been reported.  Study is being performed to rule out possible seizure disorder.  DESCRIPTION:  This is a routine EEG recording performed during wakefulness.  The dominant background activity consisted of 10-11 Hz symmetrical alpha rhythm recorded from the posterior hip region.  There was good attenuation of alpha activity with eye opening as well as with alerting procedures.  Photic stimulation produced a symmetrical occipital driving response.  Hyperventilation produced a normal transient, generalized, mal slowing response.  No epileptiform discharges were seen.  There were no areas of abnormal slowing of cerebral activity.  INTERPRETATION:  This is a normal EEG recording during wakefulness.  No evidence of an epileptic disorder was demonstrated.     Noel Christmas, MD    OZ:HYQM D:  05/27/2012 13:10:32  T:  05/28/2012 02:45:49  Job #:  [id number]

## 2012-06-01 ENCOUNTER — Emergency Department (HOSPITAL_COMMUNITY)
Admission: EM | Admit: 2012-06-01 | Discharge: 2012-06-02 | Disposition: A | Discharge status: Home / Self Care | Payer: Medicaid Other | Attending: Emergency Medicine | Admitting: Emergency Medicine

## 2012-06-01 ENCOUNTER — Encounter (HOSPITAL_COMMUNITY): Payer: Self-pay | Admitting: Family Medicine

## 2012-06-01 DIAGNOSIS — G43909 Migraine, unspecified, not intractable, without status migrainosus: Secondary | ICD-10-CM | POA: Insufficient documentation

## 2012-06-01 NOTE — ED Notes (Signed)
Per EMS: Pt c/o migraine secondary to near syncopal episode. States this has been going on x1 month, progressively worse today. NAD, VSS.

## 2012-06-01 NOTE — ED Notes (Signed)
Pt states she has been having cramping in her right upper leg lately

## 2012-06-02 LAB — BASIC METABOLIC PANEL
BUN: 15 mg/dL (ref 6–23)
Calcium: 9.3 mg/dL (ref 8.4–10.5)
GFR calc non Af Amer: >90 mL/min (ref >90)
Glucose, Bld: 86 mg/dL (ref 70–99)
Potassium: 3.8 mEq/L (ref 3.5–5.1)

## 2012-06-02 LAB — CBC
HCT: 38.5 % (ref 36.0–46.0)
Hemoglobin: 12.9 g/dL (ref 12.0–15.0)
MCH: 26.3 pg (ref 26.0–34.0)
MCHC: 33.5 g/dL (ref 30.0–36.0)
MCV: 78.4 fL (ref 78.0–100.0)

## 2012-06-02 MED ORDER — METOCLOPRAMIDE HCL 5 MG/ML IJ SOLN
10.0000 mg | Freq: Once | INTRAMUSCULAR | Status: AC
  Administered 2012-06-02: 10 mg via INTRAVENOUS
  Filled 2012-06-02: qty 2

## 2012-06-02 MED ORDER — HYDROCODONE-ACETAMINOPHEN 5-325 MG PO TABS: 1.0000 | ORAL_TABLET | Freq: Four times a day (QID) | ORAL | Status: AC | PRN

## 2012-06-02 MED ORDER — HYDROMORPHONE HCL PF 1 MG/ML IJ SOLN
1.0000 mg | Freq: Once | INTRAMUSCULAR | Status: AC
  Administered 2012-06-02: 1 mg via INTRAVENOUS
  Filled 2012-06-02: qty 1

## 2012-06-02 MED ORDER — KETOROLAC TROMETHAMINE 30 MG/ML IJ SOLN
30.0000 mg | Freq: Once | INTRAMUSCULAR | Status: AC
  Administered 2012-06-02: 30 mg via INTRAVENOUS
  Filled 2012-06-02: qty 1

## 2012-06-02 MED ORDER — SODIUM CHLORIDE 0.9 % IV BOLUS (SEPSIS)
1000.0000 mL | Freq: Once | INTRAVENOUS | Status: AC
  Administered 2012-06-02: 1000 mL via INTRAVENOUS

## 2012-06-02 NOTE — ED Provider Notes (Signed)
History     CSN: [id number]  Arrival date & time 06/01/12  2113   First MD Initiated Contact with Patient 06/01/12 2350      Chief Complaint  Patient presents with  . Migraine  . Near Syncope    (Consider location/radiation/quality/duration/timing/severity/associated sxs/prior treatment) The history is provided by the patient.   the patient reports developing migraine headache for the past 24 hours.  She reports she is having recurrent syncopal episodes is being followed closely by her neurologist and her primary care physician.  She's had imaging of her head as well as an EEG.  She is scheduled for Holter monitoring.  Today she reports that she was standing she became lightheaded and and then fell.  Her headache are her was going on for some time prior to this.  Her headache is worse with light and loud noises and is consistent with her prior migraine headaches.  She denies weakness of her upper lower extremities.  She denies head injury.  He does not sound like she truly lost consciousness as much she just became weak.  She denies chest pain or palpitations.  She has no abdominal pain.  She reports no change in her vision.  She denies dysuria.   Past Medical History  Diagnosis Date  . Migraine     Past Surgical History  Procedure Date  . Hand surgery   . Tubal ligation   . Cesarean section   . Knee surgery     Family History  Problem Relation Age of Onset  . Cancer Other   . Stroke Other   . Diabetes Other   . Coronary artery disease Other     History  Substance Use Topics  . Smoking status: Never Smoker   . Smokeless tobacco: Not on file  . Alcohol Use: Yes     socially    OB History    Grav Para Term Preterm Abortions TAB SAB Ect Mult Living                  Review of Systems  All other systems reviewed and are negative.    Allergies  Latex; Sulfa antibiotics; and Zomig  Home Medications   Current Outpatient Rx  Name Route Sig Dispense Refill  .  ALPRAZOLAM 1 MG PO TABS Oral Take 1 mg by mouth 3 (three) times daily as needed. As needed for anxiety.    Marland Kitchen DIAZEPAM 10 MG PO TABS Oral Take 5 mg by mouth every 8 (eight) hours as needed. Migraines.    . IBUPROFEN 200 MG PO TABS Oral Take 600 mg by mouth every 6 (six) hours as needed. Migraines    . TOPIRAMATE 25 MG PO TABS Oral Take 25 mg by mouth 2 (two) times daily.    Marland Kitchen VILAZODONE HCL 10 & 20 & 40 MG PO KIT Oral Take 20 mg by mouth daily.    Marland Kitchen HYDROCODONE-ACETAMINOPHEN 5-325 MG PO TABS Oral Take 1 tablet by mouth every 6 (six) hours as needed for pain. 6 tablet 0    BP 112/70  Pulse 93  Temp 98.6 F (37 C) (Oral)  Resp 22  Ht 5\' 1"  (1.549 m)  Wt 255 lb (115.667 kg)  BMI 48.18 kg/m2  SpO2 99%  LMP 05/29/2012  Physical Exam  Nursing note and vitals reviewed. Constitutional: She is oriented to person, place, and time. She appears well-developed and well-nourished. No distress.  HENT:  Head: Normocephalic and atraumatic.  Eyes: EOM are normal. Pupils  are equal, round, and reactive to light.  Neck: Normal range of motion.  Cardiovascular: Normal rate, regular rhythm and normal heart sounds.   Pulmonary/Chest: Effort normal and breath sounds normal.  Abdominal: Soft. She exhibits no distension. There is no tenderness.  Musculoskeletal: Normal range of motion.  Neurological: She is alert and oriented to person, place, and time.       5/5 strength in major muscle groups of  bilateral upper and lower extremities. Speech normal. No facial asymetry.   Skin: Skin is warm and dry.  Psychiatric: She has a normal mood and affect. Judgment normal.    ED Course  Procedures (including critical care time)  Labs Reviewed  CBC - Abnormal; Notable for the following:    WBC 12.5 (*)     Platelets 418 (*)     All other components within normal limits  BASIC METABOLIC PANEL  HCG, SERUM, QUALITATIVE   No results found.   1. Migraine       MDM   the patient is well-appearing.   Migraine cocktail given.  She feels like her headache is improving at this time.  She's a normal neurologic exam.  His unlikely that find out the etiology of her recurrent near-syncopal episodes.  This is being followed closely by her physicians and thus I will defer to them at this point.  Vital signs are stable.          Lyanne Co, MD 06/02/12 [phone]

## 2012-06-27 ENCOUNTER — Encounter (HOSPITAL_COMMUNITY): Payer: Self-pay | Admitting: Emergency Medicine

## 2012-06-27 ENCOUNTER — Emergency Department (HOSPITAL_COMMUNITY): Payer: Medicaid Other

## 2012-06-27 ENCOUNTER — Emergency Department (HOSPITAL_COMMUNITY)
Admission: EM | Admit: 2012-06-27 | Discharge: 2012-06-27 | Disposition: A | Discharge status: Home / Self Care | Payer: Medicaid Other | Attending: Emergency Medicine | Admitting: Emergency Medicine

## 2012-06-27 DIAGNOSIS — R0789 Other chest pain: Secondary | ICD-10-CM

## 2012-06-27 DIAGNOSIS — F329 Major depressive disorder, single episode, unspecified: Secondary | ICD-10-CM

## 2012-06-27 DIAGNOSIS — F3289 Other specified depressive episodes: Secondary | ICD-10-CM | POA: Insufficient documentation

## 2012-06-27 DIAGNOSIS — F32A Depression, unspecified: Secondary | ICD-10-CM

## 2012-06-27 MED ORDER — ALPRAZOLAM 1 MG PO TABS: 1.0000 mg | ORAL_TABLET | Freq: Three times a day (TID) | ORAL | Status: DC | PRN

## 2012-06-27 MED ORDER — VILAZODONE HCL 40 MG PO TABS: 40.0000 mg | ORAL_TABLET | Freq: Every day | ORAL | Status: DC

## 2012-06-27 MED ORDER — KETOROLAC TROMETHAMINE 30 MG/ML IJ SOLN
30.0000 mg | Freq: Once | INTRAMUSCULAR | Status: AC
  Administered 2012-06-27: 30 mg via INTRAVENOUS

## 2012-06-27 MED ORDER — LORAZEPAM 1 MG PO TABS
2.0000 mg | ORAL_TABLET | Freq: Once | ORAL | Status: AC
  Administered 2012-06-27: 2 mg via ORAL
  Filled 2012-06-27: qty 2

## 2012-06-27 MED ORDER — KETOROLAC TROMETHAMINE 30 MG/ML IJ SOLN
INTRAMUSCULAR | Status: AC
  Filled 2012-06-27: qty 1

## 2012-06-27 MED ORDER — KETOROLAC TROMETHAMINE 60 MG/2ML IM SOLN
60.0000 mg | Freq: Once | INTRAMUSCULAR | Status: DC
  Filled 2012-06-27: qty 2

## 2012-06-27 NOTE — ED Notes (Signed)
Istat trop. Results placed on Pt's chart. Ed-Lab

## 2012-06-27 NOTE — ED Provider Notes (Signed)
History     CSN: [id number]  Arrival date & time 06/27/12  4098   First MD Initiated Contact with Patient 06/27/12 0703      Chief Complaint  Patient presents with  . Chest Pain    increases with movement of left arm and deep breathing    (Consider location/radiation/quality/duration/timing/severity/associated sxs/prior treatment) HPI Comments: Pt with h/o passing out, migraines, depression, anxiety reports this AM has left central and sternal CP that is pressure sensation, goes to left shoulder and left upper back, hurts worse to brings arms together and deep breath.  No fevers, cough.  Has not passed out since 2 days ago.  May have injured neck when she fell.  Spouse reports when she passes out her eyelids flutter.   She reports recently aunt and mother have passed away.  She is being worked up at [facility]  She has a neurologist who is planning on MRI and also 3 day EEG in the hospital.  No vomiting, no diaphoresis, no long distacne travel.  Has palpitations this AM.  No exertional CP.  Doesn't smoke.  No fever or cough.    The history is provided by the patient and medical records.    Past Medical History  Diagnosis Date  . Migraine     Past Surgical History  Procedure Date  . Hand surgery   . Tubal ligation   . Cesarean section   . Knee surgery     Family History  Problem Relation Age of Onset  . Cancer Other   . Stroke Other   . Diabetes Other   . Coronary artery disease Other     History  Substance Use Topics  . Smoking status: Never Smoker   . Smokeless tobacco: Not on file  . Alcohol Use: Yes     socially    OB History    Grav Para Term Preterm Abortions TAB SAB Ect Mult Living                  Review of Systems  Constitutional: Negative for fever, chills and diaphoresis.  HENT: Positive for neck pain. Negative for congestion.   Eyes: Negative for photophobia.  Respiratory: Positive for chest tightness.   Cardiovascular: Positive for chest pain and  palpitations. Negative for leg swelling.  Gastrointestinal: Positive for nausea. Negative for vomiting and abdominal pain.  Musculoskeletal: Positive for back pain.  Skin: Negative for rash.  Neurological: Positive for syncope and headaches.  Psychiatric/Behavioral: The patient is nervous/anxious.   All other systems reviewed and are negative.    Allergies  Zomig; Latex; and Sulfa antibiotics  Home Medications   Current Outpatient Rx  Name Route Sig Dispense Refill  . IBUPROFEN 200 MG PO TABS Oral Take 600 mg by mouth every 6 (six) hours as needed. Migraines    . TOPIRAMATE 50 MG PO TABS Oral Take 50 mg by mouth 2 (two) times daily.    Marland Kitchen VILAZODONE HCL 40 MG PO TABS Oral Take 40 mg by mouth daily.    Marland Kitchen ALPRAZOLAM 1 MG PO TABS Oral Take 1 tablet (1 mg total) by mouth 3 (three) times daily as needed for anxiety. 21 tablet 0  . VILAZODONE HCL 40 MG PO TABS Oral Take 1 tablet (40 mg total) by mouth daily. 15 tablet 0    BP 122/79  Pulse 89  Temp 98.6 F (37 C) (Oral)  Resp 18  Ht 5\' 2"  (1.575 m)  Wt 265 lb (120.203 kg)  BMI  48.47 kg/m2  SpO2 99%  LMP 05/29/2012  Physical Exam  Nursing note and vitals reviewed. Constitutional: She is oriented to person, place, and time. She appears well-developed and well-nourished.  HENT:  Head: Normocephalic and atraumatic.  Neck: Normal range of motion. Neck supple.  Cardiovascular: Normal rate and regular rhythm.   No murmur heard. Pulmonary/Chest: Effort normal. No respiratory distress. She exhibits tenderness. She exhibits no crepitus, no edema, no deformity and no swelling.    Abdominal: Soft.  Musculoskeletal: She exhibits no edema and no tenderness.  Neurological: She is alert and oriented to person, place, and time.  Skin: Skin is warm and dry. No rash noted.  Psychiatric: Her behavior is normal. Judgment normal. Her mood appears anxious. Cognition and memory are not impaired. She exhibits a depressed mood.       Tearfull     ED Course  Procedures (including critical care time)  Labs Reviewed - No data to display Dg Chest 2 View  06/27/2012  *RADIOLOGY REPORT*  Clinical Data: Left-sided chest pain and shortness of breath. Nausea.  CHEST - 2 VIEW  Comparison: Chest x-ray 05/17/2012.  Findings: Lung volumes are normal.  No consolidative airspace disease.  No pleural effusions.  No pneumothorax.  No pulmonary nodule or mass noted.  Pulmonary vasculature and the cardiomediastinal silhouette are within normal limits.  IMPRESSION: 1. No radiographic evidence of acute cardiopulmonary disease.   Original Report Authenticated By: Florencia Reasons, M.D.    I reviewed above films myself  1. Musculoskeletal chest pain   2. Depression     ra sat is 100% and I interpret to be normal.  MDM  Pt has musculoskeletal CP, possibly injured somewhat from her passing out.  I suspect possibly pseudoseizures based on spouse description.  No seizure today, no status, is being worked up as outpt.  Possibly anxiety and depression component of CP as well.  Pt's mild tachycardia already resolved when I saw pt, HR about 90 when I saw her.  Clearly anxious and tearful during my history taking.   essentailly PERC negative.  No risk for PE or DVT. Pt reports has been out of depression meds last 4 days, will refill her Viibryd.  Will give ativan and toradol here.          Gavin Pound. Alanmichael Barmore, MD 06/27/12 0800

## 2012-06-27 NOTE — ED Notes (Addendum)
Patient complaining of left sided chest pain that radiates to her back.  Patient reports that the pain started off as a "stabbing" feeling; patient now reports that pain is "constant".  Patient reports slight shortness of breath; chest pain worsens upon deep inspirations.  Reports nausea; denies vomiting, diaphoresis.  Patient reports history of migraines and syncopal episodes; last syncopal episode yesterday -- patient cancelled EMS; EMS encouraged patient to come in due to her neck pain and hypertension.  Patient is currently reporting a migraine and states that she has been out of her depression medications for four days -- reports a lot of stressors at home recently.  Patient alert and oriented x4; PERRL present.  Upon arrival to room, patient changed into gown and connected to continuous cardiac, pulse ox, and blood pressure monitor.  Will continue to monitor.

## 2012-06-27 NOTE — ED Notes (Signed)
Pt discharged home. Had no further questions. Encouraged to follow up with PCP. 

## 2012-06-27 NOTE — ED Notes (Signed)
Pt reports onset of Left chest pain that inmcreased with deep breathing and movement left arm that radiates to back

## 2012-06-30 LAB — POCT I-STAT TROPONIN I: Troponin i, poc: 0 ng/mL (ref 0.00–0.08)

## 2012-11-08 ENCOUNTER — Telehealth: Payer: Self-pay | Admitting: Genetic Counselor

## 2012-11-08 NOTE — Telephone Encounter (Signed)
S/W pt in re Genetic Appt on 3/27 @ 10 w/Karen Lowell [employer]. Referring Dr. Aida Puffer Welcome packet mailed.

## 2012-12-30 ENCOUNTER — Ambulatory Visit (HOSPITAL_BASED_OUTPATIENT_CLINIC_OR_DEPARTMENT_OTHER): Payer: Medicaid Other | Admitting: Genetic Counselor

## 2012-12-30 ENCOUNTER — Other Ambulatory Visit: Payer: Medicaid Other | Admitting: Lab

## 2012-12-30 ENCOUNTER — Encounter: Payer: Self-pay | Admitting: Genetic Counselor

## 2012-12-30 DIAGNOSIS — Z808 Family history of malignant neoplasm of other organs or systems: Secondary | ICD-10-CM

## 2012-12-30 DIAGNOSIS — Z803 Family history of malignant neoplasm of breast: Secondary | ICD-10-CM

## 2012-12-30 NOTE — Progress Notes (Signed)
Dr.  Aida Puffer requested a consultation for genetic counseling and risk assessment for Tanya [name], a 32 y.o. female, for discussion of her family history of breast, ovarian and prostate cancer. She presents to clinic today to discuss the possibility of a genetic predisposition to cancer, and to further clarify her risks, as well as her family members' risks for cancer.   HISTORY OF PRESENT ILLNESS: Tanya [name] is a 32 y.o. female with no personal history of cancer.    Past Medical History  Diagnosis Date  . Migraine     Past Surgical History  Procedure Laterality Date  . Hand surgery    . Tubal ligation    . Cesarean section    . Knee surgery      History  Substance Use Topics  . Smoking status: Never Smoker   . Smokeless tobacco: Not on file  . Alcohol Use: Yes     Comment: socially    REPRODUCTIVE HISTORY AND PERSONAL RISK ASSESSMENT FACTORS: Menarche was at age 78.   Premenopausal Uterus Intact: Yes Ovaries Intact: Yes G2P2A0 , first live birth at age 38  She has not previously undergone treatment for infertility.   OCP use for 1 year   She has not used HRT in the past.    FAMILY HISTORY:  We obtained a detailed, 4-generation family history.  Significant diagnoses are listed below: Family History  Problem Relation Age of Onset  . Cancer Other   . Stroke Other   . Diabetes Other   . Coronary artery disease Other   . Breast cancer Mother 68  . Prostate cancer Father 47  . Breast cancer Maternal Aunt     dx in her 72s; BRCA neg  . Prostate cancer Paternal Uncle   . Prostate cancer Paternal Grandfather   . Ovarian cancer Sister 73    found during her pregnancy  . Prostate cancer Paternal Uncle   . Prostate cancer Paternal Uncle   The patient has one full sister, a paternal half sister and two paternal half brothers.  Her full sister has found a lump in her breast but does not have health insurance to be able to get a mammogram.  Her half sister  reportedly had ovarian cancer at age 71-27 which was found during a pregnancy.  She has is currently pregnant with her 3rd child.  The patient's mother was diagnosed with breast cancer at age 29 and her maternal aunt was diagnosed with breast cancer in her 70s.  This aunt had "genetic testing" and was found to be negative. The patient's father was diagnosed with prostate cancer at 55.  He had four brothers and five sisters.  Three of his brothers were diagnosed with prostate cancer, as was his father.    Patient's maternal ancestors are of unknown descent, and paternal ancestors are of unknown descent. There is no reported Ashkenazi Jewish ancestry. There is no  known consanguinity.  GENETIC COUNSELING RISK ASSESSMENT, DISCUSSION, AND SUGGESTED FOLLOW UP: We reviewed the natural history and genetic etiology of sporadic, familial and hereditary cancer syndromes.  About 5-10% of breast cancer is hereditary.  Of this, about 85% is the result of a BRCA1 or BRCA2 mutation.  We reviewed the red flags of hereditary cancer syndromes and the dominant inheritance patterns.  If the BRCA testing is negative, we discussed that we could be testing for the wrong gene.  We discussed gene panels, and that several cancer genes that are associated with  different cancers can be tested at the same time.  [place] Medicaid does not pay for genetic testing so we discussed the option of testing through a company that reduces the cost of testing, or referring to [facility].  The pros and cons of each option were weighed.  We also discussed that since her aunt was negative, that reduces her chance of having a BRCA mutation.   The patient's family history of breast and prostate cancer is suggestive of the following possible diagnosis: hereditary cancer syndrome  We discussed that identification of a hereditary cancer syndrome may help her care providers tailor the patients medical management. If a mutation indicating a hereditary cancer  syndrome is detected in this case, the [employer] recommendations would include increased cancer surveillance and possible prophylactic surgery. If a mutation is detected, the patient will be referred back to the referring provider and to any additional appropriate care providers to discuss the relevant options.   If a mutation is not found in the patient, cancer surveillance options would be discussed for the patient according to the appropriate standard National Comprehensive Cancer Network and American Cancer Society guidelines, with consideration of their personal and family history risk factors. In this case, the patient will be referred back to their care providers for discussions of management.   In order to estimate her chance of having a BRCA mutation, we used statistical models (Tyrer Cusik) and laboratory data that take into account her personal medical history, family history and ancestry.  Because each model is different, there can be a lot of variability in the risks they give.  Therefore, these numbers must be considered a rough range and not a precise risk of having a BRCA mutation.  These models estimate that she has approximately a up to a 1% chance of having a mutation. Based on this assessment of her family and personal history, genetic testing is not recommended.  Based on the patient's personal and family history, statistical models (Tyrer Cusik)  and literature data were used to estimate her risk of developing breast cancer. These estimate her lifetime risk of developing breast cancer to be approximately 21% to 24%. This estimation does not take into account any genetic testing results.   After considering the risks, benefits, and limitations, the patient provided informed consent for  A referral to [facility] genetics.   The patient was seen for a total of 60 minutes, greater than 50% of which was spent face-to-face counseling.  This plan is being carried out per  Dr. Fayrene Fearing Little's recommendations.  This note will also be sent to the referring provider via the electronic medical record. The patient will be supplied with a summary of this genetic counseling discussion as well as educational information on the discussed hereditary cancer syndromes following the conclusion of their visit.   Patient was discussed with Dr. Drue Second.   _______________________________________________________________________ For Office Staff:  Number of people involved in session: 1 Was an Intern/ student involved with case: no }

## 2013-03-25 ENCOUNTER — Encounter (HOSPITAL_COMMUNITY): Payer: Self-pay | Admitting: Emergency Medicine

## 2013-03-25 ENCOUNTER — Emergency Department (HOSPITAL_COMMUNITY)
Admission: EM | Admit: 2013-03-25 | Discharge: 2013-03-25 | Disposition: A | Discharge status: Home / Self Care | Payer: Medicaid Other | Attending: Emergency Medicine | Admitting: Emergency Medicine

## 2013-03-25 DIAGNOSIS — G43909 Migraine, unspecified, not intractable, without status migrainosus: Secondary | ICD-10-CM

## 2013-03-25 DIAGNOSIS — R42 Dizziness and giddiness: Secondary | ICD-10-CM | POA: Insufficient documentation

## 2013-03-25 DIAGNOSIS — H53149 Visual discomfort, unspecified: Secondary | ICD-10-CM | POA: Insufficient documentation

## 2013-03-25 DIAGNOSIS — Z79899 Other long term (current) drug therapy: Secondary | ICD-10-CM | POA: Insufficient documentation

## 2013-03-25 DIAGNOSIS — R110 Nausea: Secondary | ICD-10-CM | POA: Insufficient documentation

## 2013-03-25 DIAGNOSIS — Z91040 Latex allergy status: Secondary | ICD-10-CM | POA: Insufficient documentation

## 2013-03-25 DIAGNOSIS — R55 Syncope and collapse: Secondary | ICD-10-CM

## 2013-03-25 MED ORDER — KETOROLAC TROMETHAMINE 30 MG/ML IJ SOLN
30.0000 mg | Freq: Once | INTRAMUSCULAR | Status: AC
  Administered 2013-03-25: 30 mg via INTRAVENOUS
  Filled 2013-03-25: qty 1

## 2013-03-25 MED ORDER — DIPHENHYDRAMINE HCL 50 MG/ML IJ SOLN
12.5000 mg | Freq: Once | INTRAMUSCULAR | Status: AC
  Administered 2013-03-25: 12.5 mg via INTRAVENOUS
  Filled 2013-03-25: qty 1

## 2013-03-25 MED ORDER — TOPIRAMATE 50 MG PO TABS: 50.0000 mg | ORAL_TABLET | Freq: Two times a day (BID) | ORAL | Status: DC

## 2013-03-25 MED ORDER — PROMETHAZINE HCL 25 MG/ML IJ SOLN
12.5000 mg | Freq: Once | INTRAMUSCULAR | Status: AC
  Administered 2013-03-25: 12.5 mg via INTRAVENOUS
  Filled 2013-03-25: qty 1

## 2013-03-25 MED ORDER — METOCLOPRAMIDE HCL 5 MG/ML IJ SOLN
10.0000 mg | Freq: Once | INTRAMUSCULAR | Status: AC
  Administered 2013-03-25: 10 mg via INTRAVENOUS
  Filled 2013-03-25: qty 2

## 2013-03-25 NOTE — ED Notes (Signed)
Pt arrived by [facility]. Pt c/o migraine that caused a syncopal episode. Pt was driving down the road and started to get lightheaded so she pulled of the side of the road where she then had the syncopal episode. Pt has had this happen before she stated "several times". Pt continues to have migraine.

## 2013-03-25 NOTE — ED Notes (Signed)
Family at bedside. 

## 2013-03-25 NOTE — ED Provider Notes (Signed)
ECG shows normal sinus rhythm with a rate of 64, no ectopy. Normal axis. Normal P wave. Normal QRS. Normal intervals. Normal ST and T waves. Impression: normal ECG. When compared with ECG of 06/01/2012, no significant changes are seen in the  Medical screening examination/treatment/procedure(s) were performed by non-physician practitioner and as supervising physician I was immediately available for consultation/collaboration.  Dione Booze, MD 03/25/13 1158

## 2013-03-25 NOTE — ED Provider Notes (Signed)
History     CSN: [id number]  Arrival date & time 03/25/13  1478   First MD Initiated Contact with Patient 03/25/13 1004      Chief Complaint  Patient presents with  . Migraine  . Loss of Consciousness    (Consider location/radiation/quality/duration/timing/severity/associated sxs/prior treatment) HPI Tanya [name] is a 32 y.o. female who presents to ED with complain of headache. States hx of migraines. Headache began this morning while driving. States started feeling dizzy. States pulled over on side of the road, called her husband, and then had a syncopal episode. Pt states she has hx of similar migraines, states also not unusual for her to faint with the pain. States pain is in the frontal and left temporal area. States sensitive to light, sound. Complaining of nausea and persistent dizziness. States was on topamax but ran out. No current pcp. No recent illnesses. No other complaints.   Past Medical History  Diagnosis Date  . Migraine     Past Surgical History  Procedure Laterality Date  . Hand surgery    . Tubal ligation    . Cesarean section    . Knee surgery    . Cholecystectomy      Family History  Problem Relation Age of Onset  . Cancer Other   . Stroke Other   . Diabetes Other   . Coronary artery disease Other   . Breast cancer Mother 73  . Prostate cancer Father 68  . Breast cancer Maternal Aunt     dx in her 76s; BRCA neg  . Prostate cancer Paternal Uncle   . Prostate cancer Paternal Grandfather   . Ovarian cancer Sister 16    found during her pregnancy  . Prostate cancer Paternal Uncle   . Prostate cancer Paternal Uncle     History  Substance Use Topics  . Smoking status: Never Smoker   . Smokeless tobacco: Not on file  . Alcohol Use: Yes     Comment: socially    OB History   Grav Para Term Preterm Abortions TAB SAB Ect Mult Living                  Review of Systems  Constitutional: Negative for fever and chills.  HENT: Negative for  congestion, sore throat, neck pain and neck stiffness.   Eyes: Positive for photophobia.  Respiratory: Negative.   Cardiovascular: Negative.   Genitourinary: Negative for dysuria and flank pain.  Musculoskeletal: Negative.   Skin: Negative.   Neurological: Positive for dizziness, syncope, light-headedness and headaches. Negative for speech difficulty, weakness and numbness.    Allergies  Zomig; Latex; and Sulfa antibiotics  Home Medications   Current Outpatient Rx  Name  Route  Sig  Dispense  Refill  . ALPRAZolam (XANAX) 1 MG tablet   Oral   Take 1 tablet (1 mg total) by mouth 3 (three) times daily as needed for anxiety.   21 tablet   0   . phentermine 37.5 MG capsule   Oral   Take 37.5 mg by mouth every morning.         . topiramate (TOPAMAX) 50 MG tablet   Oral   Take 50 mg by mouth 2 (two) times daily.           BP 126/71  Pulse 79  Temp(Src) 98 F (36.7 C) (Oral)  Resp 18  SpO2 100%  LMP 03/04/2013  Physical Exam  Nursing note and vitals reviewed. Constitutional: She is oriented to person,  place, and time. She appears well-developed and well-nourished. No distress.  HENT:  Head: Normocephalic.  Eyes: Conjunctivae and EOM are normal. Pupils are equal, round, and reactive to light.  Neck: Normal range of motion. Neck supple.  Cardiovascular: Normal rate, regular rhythm and normal heart sounds.   Pulmonary/Chest: Effort normal and breath sounds normal. No respiratory distress. She has no wheezes. She has no rales.  Abdominal: Soft. Bowel sounds are normal. She exhibits no distension. There is no tenderness. There is no rebound.  Musculoskeletal: She exhibits no edema.  Neurological: She is alert and oriented to person, place, and time. No cranial nerve deficit. Coordination normal.  5/5 and equal upper and lower extremity strength bilaterally. Equal grip strength bilaterally. Normal finger to nose and heel to shin. No pronator drift. Gait normal  Skin: Skin  is warm and dry.    ED Course  Procedures (including critical care time)   Date: 03/25/2013  Rate: 64  Rhythm: normal sinus rhythm  QRS Axis: normal  Intervals: normal  ST/T Wave abnormalities: normal  Conduction Disutrbances: none  Narrative Interpretation:   Old EKG Reviewed: no old   Results for orders placed during the hospital encounter of 03/25/13  POCT PREGNANCY, URINE      Result Value Range   Preg Test, Ur NEGATIVE  NEGATIVE   No results found.    1. Migraine headache   2. Syncope       MDM  Pt with typical for her migraine headache, which was associated with questionable syncopal episode which she states she has also had in the past with her migraines. Pt's neuro exam is normal. She is in no distress. Given migraine cocktail of toradol 30mg , regalan 10mg , benadryl 12.5mg . Pt's pain went down to 5/10 from 10/10. Ordered 12.5 of phenergan for additional pain relief. Pt is able to ambulate. Symptoms resolved. Pt stable for d/c home.   Filed Vitals:   03/25/13 0958 03/25/13 1000 03/25/13 1015 03/25/13 1139  BP: 117/74 117/74 126/71 113/65  Pulse: 74  79 66  Temp: 98 F (36.7 C)     TempSrc: Oral     Resp: 18   20  SpO2: 99%  100% 98%           Lottie Mussel, PA-C 03/25/13 1644

## 2013-03-27 ENCOUNTER — Emergency Department (HOSPITAL_COMMUNITY)
Admission: EM | Admit: 2013-03-27 | Discharge: 2013-03-27 | Disposition: A | Discharge status: Home / Self Care | Payer: Medicaid Other | Attending: Emergency Medicine | Admitting: Emergency Medicine

## 2013-03-27 ENCOUNTER — Encounter (HOSPITAL_COMMUNITY): Payer: Self-pay

## 2013-03-27 DIAGNOSIS — R5381 Other malaise: Secondary | ICD-10-CM | POA: Insufficient documentation

## 2013-03-27 DIAGNOSIS — R110 Nausea: Secondary | ICD-10-CM | POA: Insufficient documentation

## 2013-03-27 DIAGNOSIS — G43119 Migraine with aura, intractable, without status migrainosus: Secondary | ICD-10-CM | POA: Insufficient documentation

## 2013-03-27 DIAGNOSIS — R55 Syncope and collapse: Secondary | ICD-10-CM | POA: Insufficient documentation

## 2013-03-27 DIAGNOSIS — Z91040 Latex allergy status: Secondary | ICD-10-CM | POA: Insufficient documentation

## 2013-03-27 DIAGNOSIS — G43109 Migraine with aura, not intractable, without status migrainosus: Secondary | ICD-10-CM

## 2013-03-27 DIAGNOSIS — R42 Dizziness and giddiness: Secondary | ICD-10-CM | POA: Insufficient documentation

## 2013-03-27 LAB — CBC
HCT: 38.1 % (ref 36.0–46.0)
Hemoglobin: 13 g/dL (ref 12.0–15.0)
MCH: 26.6 pg (ref 26.0–34.0)
MCHC: 34.1 g/dL (ref 30.0–36.0)
MCV: 78.1 fL (ref 78.0–100.0)
RBC: 4.88 MIL/uL (ref 3.87–5.11)

## 2013-03-27 LAB — COMPREHENSIVE METABOLIC PANEL
ALT: 21 U/L (ref 0–35)
AST: 14 U/L (ref 0–37)
Albumin: 3.2 g/dL — ABNORMAL LOW (ref 3.5–5.2)
CO2: 24 mEq/L (ref 19–32)
Calcium: 8.9 mg/dL (ref 8.4–10.5)
Chloride: 105 mEq/L (ref 96–112)
Creatinine, Ser: 0.62 mg/dL (ref 0.50–1.10)
GFR calc non Af Amer: >90 mL/min (ref >90)
Sodium: 139 mEq/L (ref 135–145)

## 2013-03-27 MED ORDER — SODIUM CHLORIDE 0.9 % IV BOLUS (SEPSIS)
1000.0000 mL | Freq: Once | INTRAVENOUS | Status: AC
  Administered 2013-03-27: 1000 mL via INTRAVENOUS

## 2013-03-27 MED ORDER — PROMETHAZINE HCL 25 MG/ML IJ SOLN
25.0000 mg | Freq: Once | INTRAMUSCULAR | Status: AC
  Administered 2013-03-27: 25 mg via INTRAVENOUS
  Filled 2013-03-27: qty 1

## 2013-03-27 MED ORDER — METOCLOPRAMIDE HCL 5 MG/ML IJ SOLN
10.0000 mg | Freq: Once | INTRAMUSCULAR | Status: AC
  Administered 2013-03-27: 10 mg via INTRAVENOUS
  Filled 2013-03-27: qty 2

## 2013-03-27 MED ORDER — DIPHENHYDRAMINE HCL 50 MG/ML IJ SOLN
12.5000 mg | Freq: Once | INTRAMUSCULAR | Status: AC
  Administered 2013-03-27: 12.5 mg via INTRAVENOUS
  Filled 2013-03-27: qty 1

## 2013-03-27 NOTE — ED Notes (Signed)
Migraine sine Thursday,  Was seen here in our ED yesterday, treated and sent home.  Migraine continued and pt. Had a syncopal episode, Witnessed by Husband.  Pt. Is alert and oriented X4. No neuro deficits noted

## 2013-03-27 NOTE — ED Provider Notes (Signed)
History     CSN: [id number]  Arrival date & time 03/27/13  Tanya [name]   First MD Initiated Contact with Patient 03/27/13 1832      Chief Complaint  Patient presents with  . Migraine    (Consider location/radiation/quality/duration/timing/severity/associated sxs/prior treatment) HPI Pt is a 32yo female with hx of migraines presenting with headache that started earlier this morning and gradually worsened throughout the day.  Pt also reports passing out on the couch, husband found her and called EMS.  Pt reports hx of similar syncopal episodes associated with her migraines, stating she was seen on Friday for same, tx with migraine cocktail and discharged home to f/u with PCP. Pt describes pain as constant, throbbing ache, 10/10, in front right side of her head, radiating to back of neck.   Light and sound make headache worse, associated with intermittitent spots and blurred vision.  Pt has tried OTC pain medication w/o relief.  States she last saw a neurologist 87mo ago but no longer has insurance so she cannot f/u and is out of her Topamax, was given 10 pills on Friday 6/20 from ED but believes it takes longer to start working.  Denies fever chest pain or SOB.    Past Medical History  Diagnosis Date  . Migraine     Past Surgical History  Procedure Laterality Date  . Hand surgery    . Tubal ligation    . Cesarean section    . Knee surgery    . Cholecystectomy      Family History  Problem Relation Age of Onset  . Cancer Other   . Stroke Other   . Diabetes Other   . Coronary artery disease Other   . Breast cancer Mother 58  . Prostate cancer Father 67  . Breast cancer Maternal Aunt     dx in her 26s; BRCA neg  . Prostate cancer Paternal Uncle   . Prostate cancer Paternal Grandfather   . Ovarian cancer Sister 49    found during her pregnancy  . Prostate cancer Paternal Uncle   . Prostate cancer Paternal Uncle     History  Substance Use Topics  . Smoking status: Never Smoker    . Smokeless tobacco: Not on file  . Alcohol Use: Yes     Comment: socially    OB History   Grav Para Term Preterm Abortions TAB SAB Ect Mult Living                  Review of Systems  Constitutional: Positive for fatigue. Negative for fever and chills.  Eyes: Positive for photophobia and visual disturbance ( spots and blurred vision). Negative for pain and redness.  Respiratory: Negative for cough and shortness of breath.   Cardiovascular: Negative for chest pain.  Gastrointestinal: Positive for nausea. Negative for vomiting and diarrhea.  Neurological: Positive for dizziness, syncope and headaches. Negative for tremors, seizures, facial asymmetry, weakness, light-headedness and numbness.  All other systems reviewed and are negative.    Allergies  Zomig; Latex; and Sulfa antibiotics  Home Medications   Current Outpatient Rx  Name  Route  Sig  Dispense  Refill  . ALPRAZolam (XANAX) 1 MG tablet   Oral   Take 1 tablet (1 mg total) by mouth 3 (three) times daily as needed for anxiety.   21 tablet   0   . phentermine 37.5 MG capsule   Oral   Take 37.5 mg by mouth every morning.         Marland Kitchen  topiramate (TOPAMAX) 50 MG tablet   Oral   Take 1 tablet (50 mg total) by mouth 2 (two) times daily.   20 tablet   0     BP 108/68  Pulse 86  Temp(Src) 98.1 F (36.7 C) (Oral)  Resp 18  SpO2 97%  LMP 03/04/2013  Physical Exam  Nursing note and vitals reviewed. Constitutional: She is oriented to person, place, and time. She appears well-developed and well-nourished. No distress.  Pt lying in darkened exam room with hands over eyes, no acute distress.   HENT:  Head: Normocephalic and atraumatic.  Eyes: Conjunctivae are normal. No scleral icterus.  Neck: Normal range of motion. Neck supple.  No nuchal rigidity or meningeal sign  Cardiovascular: Normal rate, regular rhythm and normal heart sounds.   Pulmonary/Chest: Effort normal and breath sounds normal. No respiratory  distress. She has no wheezes. She has no rales. She exhibits no tenderness.  Musculoskeletal: Normal range of motion.  Neurological: She is alert and oriented to person, place, and time. She has normal strength. No cranial nerve deficit or sensory deficit. Coordination normal. GCS eye subscore is 4. GCS verbal subscore is 5. GCS motor subscore is 6.  CN II-XII in tact, no focal deficit, nl finger to nose coordination, nl heel to shin, 5/5 strength in all major muscle groups.   Skin: Skin is warm and dry. She is not diaphoretic.  Psychiatric: She has a normal mood and affect. Her behavior is normal.    ED Course  Procedures (including critical care time)  Labs Reviewed  COMPREHENSIVE METABOLIC PANEL - Abnormal; Notable for the following:    Albumin 3.2 (*)    All other components within normal limits  CBC - Abnormal; Notable for the following:    WBC 11.6 (*)    All other components within normal limits   No results found.   1. Migraine headache with aura   2. Syncope       MDM  Pt with hx of migraines admits to being off preventative medication, Topamax for 60mo now due to lack of insurance and lack of neurologist to prescribe medication.  States migraines and syncopal episodes with migraines have increased since stopping medication.  Migraine today is similar to those in past with gradual onset.  No focal deficits.  Neuro exam is nl. I have reviewed pt medical records and imaging which have been nl,  Do not believe further imaging is needed at this time.  Will tx with migraine cocktail, discharge home and f/u with PCP.    Signed out to Dammen PA-C at shift change.       Junius Finner, PA-C 03/28/13 1506

## 2013-03-27 NOTE — ED Provider Notes (Signed)
Tanya [name] 8:00 PM patient discussed in sign out. Patient with history of complex migraines followed by neurologist in [place]. Presenting with typical headaches and syncopal episodes. This is common with her headaches. Patient was seen 2 days ago for similar had unremarkable EKG. She has been worked up extensively in the past including Holter monitor and neurological evaluations. Patient was being treated with Topamax and Phentermine.  She does report not taking this regularly prior to the return of her symptoms 2 days ago. She was given a new prescription for Topamax at her last ED visit and did take some today which helped slightly.    Results for orders placed during the hospital encounter of 03/27/13  COMPREHENSIVE METABOLIC PANEL      Result Value Range   Sodium 139  135 - 145 mEq/L   Potassium 3.6  3.5 - 5.1 mEq/L   Chloride 105  96 - 112 mEq/L   CO2 24  19 - 32 mEq/L   Glucose, Bld 90  70 - 99 mg/dL   BUN 11  6 - 23 mg/dL   Creatinine, Ser 1.61  0.50 - 1.10 mg/dL   Calcium 8.9  8.4 - 09.6 mg/dL   Total Protein 6.3  6.0 - 8.3 g/dL   Albumin 3.2 (*) 3.5 - 5.2 g/dL   AST 14  0 - 37 U/L   ALT 21  0 - 35 U/L   Alkaline Phosphatase 52  39 - 117 U/L   Total Bilirubin 0.3  0.3 - 1.2 mg/dL   GFR calc non Af Amer >90  >90 mL/min   GFR calc Af Amer >90  >90 mL/min  CBC      Result Value Range   WBC 11.6 (*) 4.0 - 10.5 K/uL   RBC 4.88  3.87 - 5.11 MIL/uL   Hemoglobin 13.0  12.0 - 15.0 g/dL   HCT 04.5  40.9 - 81.1 %   MCV 78.1  78.0 - 100.0 fL   MCH 26.6  26.0 - 34.0 pg   MCHC 34.1  30.0 - 36.0 g/dL   RDW 91.4  78.2 - 95.6 %   Platelets 344  150 - 400 K/uL     Patient'[name] labs are unremarkable. She did have some improvement of headache at this time we'll discharge follow up with her specialist.    Angus Seller, PA-C 03/27/13 2142

## 2013-03-28 ENCOUNTER — Encounter (HOSPITAL_COMMUNITY): Payer: Self-pay

## 2013-03-28 DIAGNOSIS — G43909 Migraine, unspecified, not intractable, without status migrainosus: Secondary | ICD-10-CM | POA: Insufficient documentation

## 2013-03-28 DIAGNOSIS — Z79899 Other long term (current) drug therapy: Secondary | ICD-10-CM | POA: Insufficient documentation

## 2013-03-28 DIAGNOSIS — Z91040 Latex allergy status: Secondary | ICD-10-CM | POA: Insufficient documentation

## 2013-03-28 NOTE — ED Provider Notes (Signed)
Medical screening examination/treatment/procedure(s) were performed by non-physician practitioner and as supervising physician I was immediately available for consultation/collaboration.   Lee Kuang, MD 03/28/13 1457 

## 2013-03-28 NOTE — ED Notes (Addendum)
Pt reports she had 3 syncopal episodes today associated w/a headache, pt seen here last night for the same. Pt also c/o blurred vision

## 2013-03-29 ENCOUNTER — Encounter (HOSPITAL_COMMUNITY): Payer: Self-pay | Admitting: Radiology

## 2013-03-29 ENCOUNTER — Emergency Department (HOSPITAL_COMMUNITY)
Admission: EM | Admit: 2013-03-29 | Discharge: 2013-03-29 | Disposition: A | Discharge status: Home / Self Care | Payer: Medicaid Other | Attending: Emergency Medicine | Admitting: Emergency Medicine

## 2013-03-29 ENCOUNTER — Emergency Department (HOSPITAL_COMMUNITY): Payer: Medicaid Other

## 2013-03-29 DIAGNOSIS — G43909 Migraine, unspecified, not intractable, without status migrainosus: Secondary | ICD-10-CM

## 2013-03-29 LAB — POCT I-STAT, CHEM 8
Calcium, Ion: 1.18 mmol/L (ref 1.12–1.23)
Chloride: 104 mEq/L (ref 96–112)
HCT: 44 % (ref 36.0–46.0)
Potassium: 4.4 mEq/L (ref 3.5–5.1)

## 2013-03-29 LAB — URINALYSIS, ROUTINE W REFLEX MICROSCOPIC
Bilirubin Urine: NEGATIVE
Glucose, UA: NEGATIVE mg/dL
Hgb urine dipstick: NEGATIVE
Ketones, ur: NEGATIVE mg/dL
Leukocytes, UA: NEGATIVE
Protein, ur: NEGATIVE mg/dL
pH: 6 (ref 5.0–8.0)

## 2013-03-29 LAB — POCT PREGNANCY, URINE: Preg Test, Ur: NEGATIVE

## 2013-03-29 LAB — CBC
HCT: 41.8 % (ref 36.0–46.0)
Hemoglobin: 14.5 g/dL (ref 12.0–15.0)
Platelets: 433 10*3/uL — ABNORMAL HIGH (ref 150–400)

## 2013-03-29 MED ORDER — DEXAMETHASONE SODIUM PHOSPHATE 10 MG/ML IJ SOLN
10.0000 mg | Freq: Once | INTRAMUSCULAR | Status: AC
  Administered 2013-03-29: 10 mg via INTRAVENOUS
  Filled 2013-03-29: qty 1

## 2013-03-29 MED ORDER — METOCLOPRAMIDE HCL 5 MG/ML IJ SOLN
10.0000 mg | Freq: Once | INTRAMUSCULAR | Status: AC
  Administered 2013-03-29: 10 mg via INTRAVENOUS
  Filled 2013-03-29: qty 2

## 2013-03-29 MED ORDER — KETOROLAC TROMETHAMINE 30 MG/ML IJ SOLN
30.0000 mg | Freq: Once | INTRAMUSCULAR | Status: AC
  Administered 2013-03-29: 30 mg via INTRAVENOUS
  Filled 2013-03-29: qty 1

## 2013-03-29 MED ORDER — DIPHENHYDRAMINE HCL 50 MG/ML IJ SOLN
12.5000 mg | Freq: Once | INTRAMUSCULAR | Status: AC
  Administered 2013-03-29: 12.5 mg via INTRAVENOUS
  Filled 2013-03-29: qty 1

## 2013-03-29 MED ORDER — SODIUM CHLORIDE 0.9 % IV BOLUS (SEPSIS)
1000.0000 mL | Freq: Once | INTRAVENOUS | Status: AC
  Administered 2013-03-29: 1000 mL via INTRAVENOUS

## 2013-03-29 NOTE — ED Provider Notes (Signed)
Medical screening examination/treatment/procedure(s) were performed by non-physician practitioner and as supervising physician I was immediately available for consultation/collaboration.   Julie Manly, MD 03/29/13 0759 

## 2013-03-29 NOTE — ED Provider Notes (Signed)
Medical screening examination/treatment/procedure(s) were performed by non-physician practitioner and as supervising physician I was immediately available for consultation/collaboration.   Charles B. Sheldon, MD 03/29/13 1440 

## 2013-03-29 NOTE — ED Provider Notes (Signed)
Medical screening examination/treatment/procedure(s) were performed by non-physician practitioner and as supervising physician I was immediately available for consultation/collaboration.   Nathon Stefanski, MD 03/29/13 1809 

## 2013-03-29 NOTE — ED Provider Notes (Signed)
History    CSN: [id number] Arrival date & time 03/28/13  2116  First MD Initiated Contact with Patient 03/29/13 0358     Chief Complaint  Patient presents with  . Near Syncope   (Consider location/radiation/quality/duration/timing/severity/associated sxs/prior Treatment) HPI Comments: This is the third visit in 4, days for this patient with complicated migraines, who, states she's been passing out with her headaches.  While she was taking her Topamax on a regular basis.  She did not have any headaches.  For 3-4 months.  She ran a medicine, and was not taking it on first presentation, June 20 she was given a prescription for this medication, which he, states she's been taking for the past 3, days, but has seen.  No change in her headaches.  She has not seen her neurologist at [facility].  A while, for she's lost her medical insurance.  She states she does not have syncopal episodes when she is not having a migraine.  The history is provided by the patient.   Past Medical History  Diagnosis Date  . Migraine    Past Surgical History  Procedure Laterality Date  . Hand surgery    . Tubal ligation    . Cesarean section    . Knee surgery    . Cholecystectomy     Family History  Problem Relation Age of Onset  . Cancer Other   . Stroke Other   . Diabetes Other   . Coronary artery disease Other   . Breast cancer Mother 56  . Prostate cancer Father 29  . Breast cancer Maternal Aunt     dx in her 23s; BRCA neg  . Prostate cancer Paternal Uncle   . Prostate cancer Paternal Grandfather   . Ovarian cancer Sister 33    found during her pregnancy  . Prostate cancer Paternal Uncle   . Prostate cancer Paternal Uncle    History  Substance Use Topics  . Smoking status: Never Smoker   . Smokeless tobacco: Not on file  . Alcohol Use: Yes     Comment: socially   OB History   Grav Para Term Preterm Abortions TAB SAB Ect Mult Living                 Review of Systems  Constitutional:  Negative for fever and chills.  Eyes: Positive for photophobia. Negative for visual disturbance.  Gastrointestinal: Positive for nausea.  Musculoskeletal: Negative for back pain.  Skin: Negative for rash and wound.  Neurological: Positive for syncope and headaches. Negative for dizziness and weakness.  All other systems reviewed and are negative.    Allergies  Zomig; Latex; and Sulfa antibiotics  Home Medications   Current Outpatient Rx  Name  Route  Sig  Dispense  Refill  . ALPRAZolam (XANAX) 1 MG tablet   Oral   Take 1 tablet (1 mg total) by mouth 3 (three) times daily as needed for anxiety.   21 tablet   0   . phentermine 37.5 MG capsule   Oral   Take 37.5 mg by mouth every morning.         . topiramate (TOPAMAX) 50 MG tablet   Oral   Take 1 tablet (50 mg total) by mouth 2 (two) times daily.   20 tablet   0    BP 112/88  Pulse 120  Temp(Src) 97.9 F (36.6 C) (Oral)  Resp 20  SpO2 99%  LMP 03/04/2013 Physical Exam  Nursing note and vitals reviewed.  Constitutional: She is oriented to person, place, and time. She appears well-developed and well-nourished.  Morbidly obese  HENT:  Head: Normocephalic.  Eyes: Pupils are equal, round, and reactive to light.  Neck: Normal range of motion.  Cardiovascular: Normal rate.   Pulmonary/Chest: Effort normal.  Musculoskeletal: Normal range of motion.  Neurological: She is alert and oriented to person, place, and time.  Skin: Skin is warm and dry.    ED Course  Procedures (including critical care time) Labs Reviewed  CBC - Abnormal; Notable for the following:    RBC 5.30 (*)    Platelets 433 (*)    All other components within normal limits  POCT I-STAT, CHEM 8 - Abnormal; Notable for the following:    Glucose, Bld 115 (*)    All other components within normal limits  URINALYSIS, ROUTINE W REFLEX MICROSCOPIC  POCT PREGNANCY, URINE   Ct Head Wo Contrast  03/29/2013   *RADIOLOGY REPORT*  Clinical Data: Syncope.   CT HEAD WITHOUT CONTRAST  Technique:  Contiguous axial images were obtained from the base of the skull through the vertex without contrast.  Comparison: 05/19/2012  Findings: No acute intracranial abnormality.  Specifically, no hemorrhage, hydrocephalus, mass lesion, acute infarction, or significant intracranial injury.  No acute calvarial abnormality. Visualized paranasal sinuses and mastoids clear.  Orbital soft tissues unremarkable.  IMPRESSION: Negative exam.   Original Report Authenticated By: Charlett Nose, M.D.   1. Migraine headache     MDM   Patient's head CT is normal.  Her labs are normal.  She, states, that her headache is a she's had no further syncopal episodes.  She is not orthostatic.  She is being discharged home encouraged to take her Topamax on a regular, basis.  She's been given a resource list in case she cannot get in to see her primary care physician or if she wants to establish with a different provider  Arman Filter, NP 03/29/13 [phone]

## 2013-11-09 ENCOUNTER — Other Ambulatory Visit: Payer: Self-pay | Admitting: Physician Assistant

## 2013-11-09 DIAGNOSIS — Z1231 Encounter for screening mammogram for malignant neoplasm of breast: Secondary | ICD-10-CM

## 2013-12-15 ENCOUNTER — Ambulatory Visit
Admission: RE | Admit: 2013-12-15 | Discharge: 2013-12-15 | Disposition: A | Discharge status: Home / Self Care | Payer: Medicaid Other | Source: Ambulatory Visit | Attending: Physician Assistant | Admitting: Physician Assistant

## 2013-12-15 DIAGNOSIS — Z1231 Encounter for screening mammogram for malignant neoplasm of breast: Secondary | ICD-10-CM

## 2013-12-16 ENCOUNTER — Other Ambulatory Visit: Payer: Self-pay | Admitting: Physician Assistant

## 2013-12-16 DIAGNOSIS — N63 Unspecified lump in unspecified breast: Secondary | ICD-10-CM

## 2013-12-16 DIAGNOSIS — N644 Mastodynia: Secondary | ICD-10-CM

## 2013-12-20 ENCOUNTER — Other Ambulatory Visit: Payer: Self-pay | Admitting: Physician Assistant

## 2013-12-20 DIAGNOSIS — N63 Unspecified lump in unspecified breast: Secondary | ICD-10-CM

## 2013-12-20 DIAGNOSIS — N644 Mastodynia: Secondary | ICD-10-CM

## 2014-01-02 ENCOUNTER — Ambulatory Visit
Admission: RE | Admit: 2014-01-02 | Discharge: 2014-01-02 | Disposition: A | Discharge status: Home / Self Care | Payer: Medicaid Other | Source: Ambulatory Visit | Attending: Physician Assistant | Admitting: Physician Assistant

## 2014-01-02 DIAGNOSIS — N644 Mastodynia: Secondary | ICD-10-CM

## 2014-01-02 DIAGNOSIS — N63 Unspecified lump in unspecified breast: Secondary | ICD-10-CM

## 2014-03-11 ENCOUNTER — Encounter (HOSPITAL_COMMUNITY): Payer: Self-pay | Admitting: Emergency Medicine

## 2014-03-11 ENCOUNTER — Emergency Department (HOSPITAL_COMMUNITY)
Admission: EM | Admit: 2014-03-11 | Discharge: 2014-03-11 | Disposition: A | Discharge status: Home / Self Care | Payer: Medicaid Other | Attending: Emergency Medicine | Admitting: Emergency Medicine

## 2014-03-11 DIAGNOSIS — Z79899 Other long term (current) drug therapy: Secondary | ICD-10-CM | POA: Insufficient documentation

## 2014-03-11 DIAGNOSIS — Z91040 Latex allergy status: Secondary | ICD-10-CM | POA: Insufficient documentation

## 2014-03-11 DIAGNOSIS — Z792 Long term (current) use of antibiotics: Secondary | ICD-10-CM | POA: Insufficient documentation

## 2014-03-11 DIAGNOSIS — G43909 Migraine, unspecified, not intractable, without status migrainosus: Secondary | ICD-10-CM | POA: Insufficient documentation

## 2014-03-11 MED ORDER — METOCLOPRAMIDE HCL 5 MG/ML IJ SOLN
10.0000 mg | Freq: Once | INTRAMUSCULAR | Status: AC
  Administered 2014-03-11: 10 mg via INTRAMUSCULAR
  Filled 2014-03-11: qty 2

## 2014-03-11 MED ORDER — DIPHENHYDRAMINE HCL 50 MG/ML IJ SOLN
25.0000 mg | Freq: Once | INTRAMUSCULAR | Status: AC
  Administered 2014-03-11: 25 mg via INTRAMUSCULAR
  Filled 2014-03-11: qty 1

## 2014-03-11 MED ORDER — DEXAMETHASONE SODIUM PHOSPHATE 10 MG/ML IJ SOLN
10.0000 mg | Freq: Once | INTRAMUSCULAR | Status: AC
  Administered 2014-03-11: 10 mg via INTRAMUSCULAR
  Filled 2014-03-11: qty 1

## 2014-03-11 NOTE — Discharge Instructions (Signed)
Migraine Headache A migraine headache is an intense, throbbing pain on one or both sides of your head. A migraine can last for 30 minutes to several hours. CAUSES  The exact cause of a migraine headache is not always known. However, a migraine may be caused when nerves in the brain become irritated and release chemicals that cause inflammation. This causes pain. Certain things may also trigger migraines, such as:  Alcohol.  Smoking.  Stress.  Menstruation.  Aged cheeses.  Foods or drinks that contain nitrates, glutamate, aspartame, or tyramine.  Lack of sleep.  Chocolate.  Caffeine.  Hunger.  Physical exertion.  Fatigue.  Medicines used to treat chest pain (nitroglycerine), birth control pills, estrogen, and some blood pressure medicines. SIGNS AND SYMPTOMS  Pain on one or both sides of your head.  Pulsating or throbbing pain.  Severe pain that prevents daily activities.  Pain that is aggravated by any physical activity.  Nausea, vomiting, or both.  Dizziness.  Pain with exposure to bright lights, loud noises, or activity.  General sensitivity to bright lights, loud noises, or smells. Before you get a migraine, you may get warning signs that a migraine is coming (aura). An aura may include:  Seeing flashing lights.  Seeing bright spots, halos, or zig-zag lines.  Having tunnel vision or blurred vision.  Having feelings of numbness or tingling.  Having trouble talking.  Having muscle weakness. DIAGNOSIS  A migraine headache is often diagnosed based on:  Symptoms.  Physical exam.  A CT scan or MRI of your head. These imaging tests cannot diagnose migraines, but they can help rule out other causes of headaches. TREATMENT Medicines may be given for pain and nausea. Medicines can also be given to help prevent recurrent migraines.  HOME CARE INSTRUCTIONS  Only take over-the-counter or prescription medicines for pain or discomfort as directed by your  health care provider. The use of long-term narcotics is not recommended.  Lie down in a dark, quiet room when you have a migraine.  Keep a journal to find out what may trigger your migraine headaches. For example, write down:  What you eat and drink.  How much sleep you get.  Any change to your diet or medicines.  Limit alcohol consumption.  Quit smoking if you smoke.  Get 7 9 hours of sleep, or as recommended by your health care provider.  Limit stress.  Keep lights dim if bright lights bother you and make your migraines worse. SEEK IMMEDIATE MEDICAL CARE IF:   Your migraine becomes severe.  You have a fever.  You have a stiff neck.  You have vision loss.  You have muscular weakness or loss of muscle control.  You start losing your balance or have trouble walking.  You feel faint or pass out.  You have severe symptoms that are different from your first symptoms. MAKE SURE YOU:   Understand these instructions.  Will watch your condition.  Will get help right away if you are not doing well or get worse. Document Released: 09/22/2005 Document Revised: 07/13/2013 Document Reviewed: 05/30/2013 [employer] Patient Information 2014 [employer].  

## 2014-03-11 NOTE — ED Notes (Signed)
PER EMS - pt from work with c/o migraine, denies n/v, no other complaints.  A+Ox4.  Neuros intact. NAD.

## 2014-03-11 NOTE — ED Notes (Signed)
Initial Contact - pt A+Ox4, resting on stretcher, reports c/o having an upper R molar pulled x1 week ago "and i got a drysocket", pt reports c/o 8/10 pain to mouth x1 week "and it gave me this migraine" which started this AM while pt was at work. Pt reports "the pain was so bad i got woozy and passed out".  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  Pt reports eating/drinking normally.  Denies CP/palpitations or complaints other than "woozy" prior to syncopal event.  Pt denies those complaints at this time.  Only c/o 8/10 pain to mouth/head.  Neuros grossly intact.  +photo/phono-phobia.  Denies n/v/d/c.  Skin PWD.  MAEI.  Changed to hospital gown, placed to cardiac monitor.  NAD.

## 2014-03-11 NOTE — ED Provider Notes (Signed)
CSN: [id number]     Arrival date & time 03/11/14  1350 History   First MD Initiated Contact with Patient 03/11/14 1357     Chief Complaint  Patient presents with  . Migraine     (Consider location/radiation/quality/duration/timing/severity/associated sxs/prior Treatment) HPI Comments: Patient presents with a migraine. She has a history of migraines. She's followed by a neurologist in [facility]. She states today she started having a headache across her bifrontal area. She has associated photophobia and nausea. There's no vomiting. She denies any fevers or neck pain. She denies any recent trauma. She states that her headache is similar to her past migraine-type headaches. She denies any unusual symptoms with the headache. She states she takes Topamax and Effexor for migraine control but she hasn't taken anything today for the migraine.   Past Medical History  Diagnosis Date  . Migraine    Past Surgical History  Procedure Laterality Date  . Hand surgery    . Tubal ligation    . Cesarean section    . Knee surgery    . Cholecystectomy     Family History  Problem Relation Age of Onset  . Cancer Other   . Stroke Other   . Diabetes Other   . Coronary artery disease Other   . Breast cancer Mother 17  . Prostate cancer Father 36  . Breast cancer Maternal Aunt     dx in her 81s; BRCA neg  . Prostate cancer Paternal Uncle   . Prostate cancer Paternal Grandfather   . Ovarian cancer Sister 80    found during her pregnancy  . Prostate cancer Paternal Uncle   . Prostate cancer Paternal Uncle    History  Substance Use Topics  . Smoking status: Never Smoker   . Smokeless tobacco: Not on file  . Alcohol Use: Yes     Comment: socially   OB History   Grav Para Term Preterm Abortions TAB SAB Ect Mult Living                 Review of Systems  Constitutional: Negative for fever, chills, diaphoresis and fatigue.  HENT: Negative for congestion, rhinorrhea and sneezing.   Eyes:  Positive for photophobia.  Respiratory: Negative for cough, chest tightness and shortness of breath.   Cardiovascular: Negative for chest pain and leg swelling.  Gastrointestinal: Positive for nausea. Negative for vomiting, abdominal pain, diarrhea and blood in stool.  Genitourinary: Negative for frequency, hematuria, flank pain and difficulty urinating.  Musculoskeletal: Negative for arthralgias and back pain.  Skin: Negative for rash.  Neurological: Positive for headaches. Negative for dizziness, speech difficulty, weakness and numbness.      Allergies  Zomig; Latex; and Sulfa antibiotics  Home Medications   Prior to Admission medications   Medication Sig Start Date End Date Taking? Authorizing Provider  acetaminophen (TYLENOL) 500 MG tablet Take 1,000 mg by mouth every 6 (six) hours as needed for mild pain.   Yes Historical Provider, MD  ALPRAZolam Duanne Moron) 1 MG tablet Take 1 tablet (1 mg total) by mouth 3 (three) times daily as needed for anxiety. 06/27/12  Yes Saddie Benders. Ghim, MD  amoxicillin (AMOXIL) 500 MG capsule Take 500 mg by mouth every 6 (six) hours. Unknown days supply 03/08/14  Yes Historical Provider, MD  oxyCODONE-acetaminophen (PERCOCET/ROXICET) 5-325 MG per tablet Take 1 tablet by mouth every 6 (six) hours as needed for severe pain.   Yes Historical Provider, MD  topiramate (TOPAMAX) 100 MG tablet Take 100-200 mg  by mouth 2 (two) times daily. Takes 144m in the morning and 2051mat bedtime   Yes Historical Provider, MD  venlafaxine XR (EFFEXOR-XR) 75 MG 24 hr capsule Take 75 mg by mouth at bedtime.   Yes Historical Provider, MD   BP 124/73  Pulse 86  Temp(Src) 98.5 F (36.9 C) (Oral)  Resp 16  SpO2 100%  LMP 03/06/2014 Physical Exam  Constitutional: She is oriented to person, place, and time. She appears well-developed and well-nourished.  HENT:  Head: Normocephalic and atraumatic.  Eyes: Pupils are equal, round, and reactive to light.  Neck: Normal range of  motion. Neck supple.  Cardiovascular: Normal rate, regular rhythm and normal heart sounds.   Pulmonary/Chest: Effort normal and breath sounds normal. No respiratory distress. She has no wheezes. She has no rales. She exhibits no tenderness.  Abdominal: Soft. Bowel sounds are normal. There is no tenderness. There is no rebound and no guarding.  Musculoskeletal: Normal range of motion. She exhibits no edema.  Lymphadenopathy:    She has no cervical adenopathy.  Neurological: She is alert and oriented to person, place, and time. She has normal strength. No cranial nerve deficit or sensory deficit. GCS eye subscore is 4. GCS verbal subscore is 5. GCS motor subscore is 6.  Skin: Skin is warm and dry. No rash noted.  Psychiatric: She has a normal mood and affect.    ED Course  Procedures (including critical care time) Labs Review Labs Reviewed - No data to display  Imaging Review No results found.   EKG Interpretation None      MDM   Final diagnoses:  Migraine    Patient is given Decadron, Benadryl and Reglan. She's feeling much better and feels that her headache is almost gone. She'll be discharged home as her followup with her neurologist as needed. Advised to return here for symptoms worsen. Her headache is typical for her normal migraines. There's no unusual symptoms more suggestive of subarachnoid hemorrhage or meningitis.    MeMalvin JohnsMD 03/11/14 1536

## 2014-06-07 ENCOUNTER — Emergency Department (HOSPITAL_COMMUNITY)
Admission: EM | Admit: 2014-06-07 | Discharge: 2014-06-07 | Disposition: A | Discharge status: Home / Self Care | Payer: Self-pay | Attending: Emergency Medicine | Admitting: Emergency Medicine

## 2014-06-07 ENCOUNTER — Emergency Department (HOSPITAL_COMMUNITY): Payer: Medicaid Other

## 2014-06-07 ENCOUNTER — Encounter (HOSPITAL_COMMUNITY): Payer: Self-pay | Admitting: Emergency Medicine

## 2014-06-07 DIAGNOSIS — O9989 Other specified diseases and conditions complicating pregnancy, childbirth and the puerperium: Secondary | ICD-10-CM | POA: Insufficient documentation

## 2014-06-07 DIAGNOSIS — R1032 Left lower quadrant pain: Secondary | ICD-10-CM | POA: Insufficient documentation

## 2014-06-07 DIAGNOSIS — R509 Fever, unspecified: Secondary | ICD-10-CM | POA: Insufficient documentation

## 2014-06-07 DIAGNOSIS — Z91040 Latex allergy status: Secondary | ICD-10-CM | POA: Insufficient documentation

## 2014-06-07 DIAGNOSIS — R112 Nausea with vomiting, unspecified: Secondary | ICD-10-CM | POA: Insufficient documentation

## 2014-06-07 DIAGNOSIS — Z79899 Other long term (current) drug therapy: Secondary | ICD-10-CM | POA: Insufficient documentation

## 2014-06-07 DIAGNOSIS — Z3200 Encounter for pregnancy test, result unknown: Secondary | ICD-10-CM

## 2014-06-07 DIAGNOSIS — G43909 Migraine, unspecified, not intractable, without status migrainosus: Secondary | ICD-10-CM | POA: Insufficient documentation

## 2014-06-07 DIAGNOSIS — R630 Anorexia: Secondary | ICD-10-CM | POA: Insufficient documentation

## 2014-06-07 LAB — WET PREP, GENITAL
Clue Cells Wet Prep HPF POC: NONE SEEN
TRICH WET PREP: NONE SEEN
WBC, Wet Prep HPF POC: NONE SEEN
YEAST WET PREP: NONE SEEN

## 2014-06-07 LAB — CBC WITH DIFFERENTIAL/PLATELET
BASOS ABS: 0 10*3/uL (ref 0.0–0.1)
BASOS PCT: 0 % (ref 0–1)
Eosinophils Absolute: 0.1 10*3/uL (ref 0.0–0.7)
Eosinophils Relative: 1 % (ref 0–5)
HEMATOCRIT: 39.4 % (ref 36.0–46.0)
HEMOGLOBIN: 12.6 g/dL (ref 12.0–15.0)
Lymphocytes Relative: 22 % (ref 12–46)
Lymphs Abs: 2.5 10*3/uL (ref 0.7–4.0)
MCH: 24.7 pg — ABNORMAL LOW (ref 26.0–34.0)
MCHC: 32 g/dL (ref 30.0–36.0)
MCV: 77.1 fL — ABNORMAL LOW (ref 78.0–100.0)
Monocytes Absolute: 0.7 10*3/uL (ref 0.1–1.0)
Monocytes Relative: 6 % (ref 3–12)
NEUTROS ABS: 7.9 10*3/uL — AB (ref 1.7–7.7)
NEUTROS PCT: 71 % (ref 43–77)
Platelets: 498 10*3/uL — ABNORMAL HIGH (ref 150–400)
RBC: 5.11 MIL/uL (ref 3.87–5.11)
RDW: 13.8 % (ref 11.5–15.5)
WBC: 11.2 10*3/uL — ABNORMAL HIGH (ref 4.0–10.5)

## 2014-06-07 LAB — ABO/RH: ABO/RH(D): A POS

## 2014-06-07 LAB — URINALYSIS, ROUTINE W REFLEX MICROSCOPIC
BILIRUBIN URINE: NEGATIVE
GLUCOSE, UA: NEGATIVE mg/dL
HGB URINE DIPSTICK: NEGATIVE
KETONES UR: NEGATIVE mg/dL
Leukocytes, UA: NEGATIVE
Nitrite: NEGATIVE
PH: 7 (ref 5.0–8.0)
Protein, ur: NEGATIVE mg/dL
SPECIFIC GRAVITY, URINE: 1.017 (ref 1.005–1.030)
Urobilinogen, UA: 0.2 mg/dL (ref 0.0–1.0)

## 2014-06-07 LAB — COMPREHENSIVE METABOLIC PANEL
ALBUMIN: 3.6 g/dL (ref 3.5–5.2)
ALK PHOS: 59 U/L (ref 39–117)
ALT: 56 U/L — AB (ref 0–35)
AST: 26 U/L (ref 0–37)
Anion gap: 12 (ref 5–15)
BUN: 12 mg/dL (ref 6–23)
CHLORIDE: 108 meq/L (ref 96–112)
CO2: 21 meq/L (ref 19–32)
Calcium: 8.8 mg/dL (ref 8.4–10.5)
Creatinine, Ser: 0.78 mg/dL (ref 0.50–1.10)
GFR calc Af Amer: >90 mL/min (ref >90)
GFR calc non Af Amer: >90 mL/min (ref >90)
Glucose, Bld: 107 mg/dL — ABNORMAL HIGH (ref 70–99)
POTASSIUM: 4.1 meq/L (ref 3.7–5.3)
Sodium: 141 mEq/L (ref 137–147)
Total Protein: 7.3 g/dL (ref 6.0–8.3)

## 2014-06-07 LAB — TYPE AND SCREEN
ABO/RH(D): A POS
Antibody Screen: NEGATIVE

## 2014-06-07 LAB — LIPASE, BLOOD: Lipase: 28 U/L (ref 11–59)

## 2014-06-07 LAB — POC URINE PREG, ED: PREG TEST UR: POSITIVE — AB

## 2014-06-07 LAB — HCG, QUANTITATIVE, PREGNANCY: HCG, BETA CHAIN, QUANT, S: 615 m[IU]/mL — AB (ref <5)

## 2014-06-07 MED ORDER — OXYCODONE-ACETAMINOPHEN 5-325 MG PO TABS
2.0000 | ORAL_TABLET | Freq: Once | ORAL | Status: AC
  Administered 2014-06-07: 2 via ORAL
  Filled 2014-06-07: qty 2

## 2014-06-07 MED ORDER — SODIUM CHLORIDE 0.9 % IV BOLUS (SEPSIS)
1000.0000 mL | Freq: Once | INTRAVENOUS | Status: AC
  Administered 2014-06-07: 1000 mL via INTRAVENOUS

## 2014-06-07 MED ORDER — ONDANSETRON HCL 4 MG PO TABS: 4.0000 mg | ORAL_TABLET | Freq: Four times a day (QID) | ORAL | Status: DC

## 2014-06-07 MED ORDER — OXYCODONE-ACETAMINOPHEN 5-325 MG PO TABS: 1.0000 | ORAL_TABLET | Freq: Four times a day (QID) | ORAL | Status: DC | PRN

## 2014-06-07 MED ORDER — ONDANSETRON HCL 4 MG/2ML IJ SOLN
4.0000 mg | Freq: Once | INTRAMUSCULAR | Status: AC
  Administered 2014-06-07: 4 mg via INTRAVENOUS
  Filled 2014-06-07: qty 2

## 2014-06-07 MED ORDER — HYDROMORPHONE HCL PF 1 MG/ML IJ SOLN
0.5000 mg | Freq: Once | INTRAMUSCULAR | Status: AC
  Administered 2014-06-07: 0.5 mg via INTRAVENOUS
  Filled 2014-06-07: qty 1

## 2014-06-07 NOTE — ED Notes (Signed)
Pt presents to department for evaluation of L sided abdominal pain and N/V. 8/10 pain at the time. Pt also states L sided numbness and pain radiating into back. Ambulatory to triage. NAD.

## 2014-06-07 NOTE — ED Provider Notes (Signed)
CSN: [id number]     Arrival date & time 06/07/14  1211 History   First MD Initiated Contact with Patient 06/07/14 1315     Chief Complaint  Patient presents with  . Abdominal Pain  . Emesis     (Consider location/radiation/quality/duration/timing/severity/associated sxs/prior Treatment) Patient is a 33 y.o. female presenting with abdominal pain and vomiting. The history is provided by the patient. No language interpreter was used.  Abdominal Pain Pain location:  LLQ Pain quality: sharp   Pain radiates to:  L flank Pain severity:  Severe Onset quality:  Gradual Duration:  3 days Timing:  Intermittent Progression:  Waxing and waning Chronicity:  New Relieved by:  Nothing Exacerbated by: movement of leg, walking. Ineffective treatments:  None tried Associated symptoms: anorexia, fever, nausea and vomiting   Associated symptoms: no chest pain, no chills, no constipation, no cough, no diarrhea, no dysuria, no fatigue, no shortness of breath, no sore throat, no vaginal bleeding and no vaginal discharge   Risk factors: obesity   Risk factors comment:  Hx of TL Emesis Associated symptoms: abdominal pain   Associated symptoms: no arthralgias, no chills, no diarrhea, no headaches and no sore throat     Past Medical History  Diagnosis Date  . Migraine    Past Surgical History  Procedure Laterality Date  . Hand surgery    . Tubal ligation    . Cesarean section    . Knee surgery    . Cholecystectomy     Family History  Problem Relation Age of Onset  . Cancer Other   . Stroke Other   . Diabetes Other   . Coronary artery disease Other   . Breast cancer Mother 61  . Prostate cancer Father 52  . Breast cancer Maternal Aunt     dx in her 65s; BRCA neg  . Prostate cancer Paternal Uncle   . Prostate cancer Paternal Grandfather   . Ovarian cancer Sister 28    found during her pregnancy  . Prostate cancer Paternal Uncle   . Prostate cancer Paternal Uncle    History  Substance  Use Topics  . Smoking status: Never Smoker   . Smokeless tobacco: Not on file  . Alcohol Use: Yes     Comment: socially   OB History   Grav Para Term Preterm Abortions TAB SAB Ect Mult Living                 Review of Systems  Constitutional: Positive for fever. Negative for chills, diaphoresis, activity change, appetite change and fatigue.  HENT: Negative for congestion, facial swelling, rhinorrhea and sore throat.   Eyes: Negative for photophobia and discharge.  Respiratory: Negative for cough, chest tightness and shortness of breath.   Cardiovascular: Negative for chest pain, palpitations and leg swelling.  Gastrointestinal: Positive for nausea, vomiting, abdominal pain and anorexia. Negative for diarrhea and constipation.  Endocrine: Negative for polydipsia and polyuria.  Genitourinary: Negative for dysuria, frequency, vaginal bleeding, vaginal discharge, difficulty urinating and pelvic pain.  Musculoskeletal: Negative for arthralgias, back pain, neck pain and neck stiffness.  Skin: Negative for color change and wound.  Allergic/Immunologic: Negative for immunocompromised state.  Neurological: Negative for facial asymmetry, weakness, numbness and headaches.  Hematological: Does not bruise/bleed easily.  Psychiatric/Behavioral: Negative for confusion and agitation.      Allergies  Imitrex; Zomig; Latex; and Sulfa antibiotics  Home Medications   Prior to Admission medications   Medication Sig Start Date End Date Taking? Authorizing Provider  topiramate (TOPAMAX) 100 MG tablet Take 100-200 mg by mouth 2 (two) times daily. Takes $RemoveBefo'100mg'rlCiRSRVrAB$  in the morning and $RemoveBef'200mg'wnjyRnuLik$  at bedtime   Yes Historical Provider, MD  ondansetron (ZOFRAN) 4 MG tablet Take 1 tablet (4 mg total) by mouth every 6 (six) hours. 06/07/14   Ernestina Patches, MD  oxyCODONE-acetaminophen (PERCOCET/ROXICET) 5-325 MG per tablet Take 1 tablet by mouth every 6 (six) hours as needed for moderate pain or severe pain. 06/07/14    Ernestina Patches, MD   BP 100/54  Pulse 105  Temp(Src) 97.9 F (36.6 C) (Oral)  Resp 20  SpO2 99% Physical Exam  Constitutional: She is oriented to person, place, and time. She appears well-developed and well-nourished. No distress.  HENT:  Head: Normocephalic and atraumatic.  Mouth/Throat: No oropharyngeal exudate.  Eyes: Pupils are equal, round, and reactive to light.  Neck: Normal range of motion. Neck supple.  Cardiovascular: Normal rate, regular rhythm and normal heart sounds.  Exam reveals no gallop and no friction rub.   No murmur heard. Pulmonary/Chest: Effort normal and breath sounds normal. No respiratory distress. She has no wheezes. She has no rales.  Abdominal: Soft. Bowel sounds are normal. She exhibits no distension and no mass. There is tenderness in the left lower quadrant. There is no rebound and no guarding.    Genitourinary: Left adnexum displays tenderness.  White, clumpy d/c on walls of vaginal canal  Musculoskeletal: Normal range of motion. She exhibits no edema and no tenderness.  Neurological: She is alert and oriented to person, place, and time.  Skin: Skin is warm and dry.  Psychiatric: She has a normal mood and affect.    ED Course  Procedures (including critical care time) Labs Review Labs Reviewed  CBC WITH DIFFERENTIAL - Abnormal; Notable for the following:    WBC 11.2 (*)    MCV 77.1 (*)    MCH 24.7 (*)    Platelets 498 (*)    Neutro Abs 7.9 (*)    All other components within normal limits  COMPREHENSIVE METABOLIC PANEL - Abnormal; Notable for the following:    Glucose, Bld 107 (*)    ALT 56 (*)    Total Bilirubin <0.2 (*)    All other components within normal limits  URINALYSIS, ROUTINE W REFLEX MICROSCOPIC - Abnormal; Notable for the following:    APPearance HAZY (*)    All other components within normal limits  HCG, QUANTITATIVE, PREGNANCY - Abnormal; Notable for the following:    hCG, Beta Chain, Quant, S 615 (*)    All other  components within normal limits  POC URINE PREG, ED - Abnormal; Notable for the following:    Preg Test, Ur POSITIVE (*)    All other components within normal limits  WET PREP, GENITAL  GC/CHLAMYDIA PROBE AMP  LIPASE, BLOOD  ABO/RH  TYPE AND SCREEN    Imaging Review US Ob Comp Less 14 Wks  06/07/2014   CLINICAL DATA:  Pain.  Positive pregnancy test.  EXAM: OBSTETRIC <14 WK ULTRASOUND  TECHNIQUE: Transabdominal ultrasound was performed for evaluation of the gestation as well as the maternal uterus and adnexal regions.  COMPARISON:  No recent.  FINDINGS: Intrauterine gestational sac: None visualized.  Yolk sac:  None visualized.  Embryo:  None visualized.  Cardiac Activity: None visualized.  None visualized.  Maternal uterus/adnexae: Difficult exam due to body habitus. No evidence of subchorionic hemorrhage. Right ovary not visualized. Left ovary appears normal. No free pelvic fluid. Follow-up pelvic ultrasound and beta HCG  should be considered to further evaluate to exclude early IUP or ectopic pregnancy.  IMPRESSION: No intrauterine pregnancy identified.   Electronically Signed   By: [place]   On: 06/07/2014 16:34   US Ob Transvaginal  06/07/2014   CLINICAL DATA:  Pain.  Positive pregnancy test.  EXAM: OBSTETRIC <14 WK ULTRASOUND  TECHNIQUE: Transabdominal ultrasound was performed for evaluation of the gestation as well as the maternal uterus and adnexal regions.  COMPARISON:  No recent.  FINDINGS: Intrauterine gestational sac: None visualized.  Yolk sac:  None visualized.  Embryo:  None visualized.  Cardiac Activity: None visualized.  None visualized.  Maternal uterus/adnexae: Difficult exam due to body habitus. No evidence of subchorionic hemorrhage. Right ovary not visualized. Left ovary appears normal. No free pelvic fluid. Follow-up pelvic ultrasound and beta HCG should be considered to further evaluate to exclude early IUP or ectopic pregnancy.  IMPRESSION: No intrauterine pregnancy  identified.   Electronically Signed   By: Marcello Moores  Register   On: 06/07/2014 16:34     EKG Interpretation None      MDM   Final diagnoses:  Evaluation for a suspected ectopic pregnancy   Pt is a 33 y.o. female with Pmhx as above who presents with L sided abdomina/groin pain with assoc n/v with radiation to the back, worsening for 2-3 days. She also reports having migraines this week and two epsiodes of syncope which she reports are not uncommon with her migraines. She denies current vaginal bleeding or discharge. She states her periods have been regular and she had her last period about a week ago and she is a history of a tubal ligation. She is sexually active. On PE Pt tachycardic, has had several borderline low BPs, 99/77 at the lowest. She has a left pelvic tenderness without rebound or guarding. She has no CVA tenderness. Her cardiopulmonary exam is otherwise benign. Point-of-care pregnancy test is positive. Given history of tubal ligation, suspicion for ectopic pregnancy. IV fluids IV pain meds OB ultrasound ordered. We'll do a pelvic exam when she returns.   [place] with nml L ovary, R not visualized, no pregnancy or pelvic free fluid visualized.  Spoke w/ OB at [facility] who rec 48 repeat quant, repeat US if quant 1500-2000, as well as ectopic return precautions including sudden onset worsening pain, syncope. Pt feeling improved, will PO challenge, HR 100, SBP >100. Will d/c home w/ percocet & zofran.      Ernestina Patches, MD 06/07/14 [id number]

## 2014-06-07 NOTE — Discharge Instructions (Signed)
°Ectopic Pregnancy °An ectopic pregnancy is when the fertilized egg attaches (implants) outside the uterus. Most ectopic pregnancies occur in the fallopian tube. Rarely do ectopic pregnancies occur on the ovary, intestine, pelvis, or cervix. In an ectopic pregnancy, the fertilized egg does not have the ability to develop into a normal, healthy baby.  °A ruptured ectopic pregnancy is one in which the fallopian tube gets torn or bursts and results in internal bleeding. Often there is intense abdominal pain, and sometimes, vaginal bleeding. Having an ectopic pregnancy can be life threatening. If left untreated, this dangerous condition can lead to a blood transfusion, abdominal surgery, or even death. °CAUSES  °Damage to the fallopian tubes is the suspected cause in most ectopic pregnancies.  °RISK FACTORS °Depending on your circumstances, the risk of having an ectopic pregnancy will vary. The level of risk can be divided into three categories. °High Risk °· You have gone through infertility treatment. °· You have had a previous ectopic pregnancy. °· You have had previous tubal surgery. °· You have had previous surgery to have the fallopian tubes tied (tubal ligation). °· You have tubal problems or diseases. °· You have been exposed to DES. DES is a medicine that was used until 1971 and had effects on babies whose mothers took the medicine. °· You become pregnant while using an intrauterine device (IUD) for birth control.  °Moderate Risk °· You have a history of infertility. °· You have a history of a sexually transmitted infection (STI). °· You have a history of pelvic inflammatory disease (PID). °· You have scarring from endometriosis. °· You have multiple sexual partners. °· You smoke.  °Low Risk °· You have had previous pelvic surgery. °· You use vaginal douching. °· You became sexually active before 35 years of age. °SIGNS AND SYMPTOMS  °An ectopic pregnancy should be suspected in anyone who has missed a period  and has abdominal pain or bleeding. °· You may experience normal pregnancy symptoms, such as: °¨ Nausea. °¨ Tiredness. °¨ Breast tenderness. °· Other symptoms may include: °¨ Pain with intercourse. °¨ Irregular vaginal bleeding or spotting. °¨ Cramping or pain on one side or in the lower abdomen. °¨ Fast heartbeat. °¨ Passing out while having a bowel movement. °· Symptoms of a ruptured ectopic pregnancy and internal bleeding may include: °¨ Sudden, severe pain in the abdomen and pelvis. °¨ Dizziness or fainting. °¨ Pain in the shoulder area. °DIAGNOSIS  °Tests that may be performed include: °· A pregnancy test. °· An ultrasound test. °· Testing the specific level of pregnancy hormone in the bloodstream. °· Taking a sample of uterus tissue (dilation and curettage, D&C). °· Surgery to perform a visual exam of the inside of the abdomen using a thin, lighted tube with a tiny camera on the end (laparoscope). °TREATMENT  °An injection of a medicine called methotrexate may be given. This medicine causes the pregnancy tissue to be absorbed. It is given if: °· The diagnosis is made early. °· The fallopian tube has not ruptured. °· You are considered to be a good candidate for the medicine. °Usually, pregnancy hormone blood levels are checked after methotrexate treatment. This is to be sure the medicine is effective. It may take 4-6 weeks for the pregnancy to be absorbed (though most pregnancies will be absorbed by 3 weeks). °Surgical treatment may be needed. A laparoscope may be used to remove the pregnancy tissue. If severe internal bleeding occurs, a cut (incision) may be made in the lower abdomen (laparotomy), and the ectopic   pregnancy is removed. This stops the bleeding. Part of the fallopian tube, or the whole tube, may be removed as well (salpingectomy). After surgery, pregnancy hormone tests may be done to be sure there is no pregnancy tissue left. You may receive a Rho (D) immune globulin shot if you are Rh negative  and the father is Rh positive, or if you do not know the Rh type of the father. This is to prevent problems with any future pregnancy. °SEEK IMMEDIATE MEDICAL CARE IF:  °You have any symptoms of an ectopic pregnancy. This is a medical emergency. °MAKE SURE YOU: °· Understand these instructions. °· Will watch your condition. °· Will get help right away if you are not doing well or get worse. °Document Released: 10/30/2004 Document Revised: 02/06/2014 Document Reviewed: 04/21/2013 °ExitCare® Patient Information ©2015 [employer]. This information is not intended to replace advice given to you by your health care provider. Make sure you discuss any questions you have with your health care provider. ° ° °

## 2014-06-08 ENCOUNTER — Inpatient Hospital Stay (HOSPITAL_COMMUNITY): Payer: Medicaid Other

## 2014-06-08 ENCOUNTER — Inpatient Hospital Stay (HOSPITAL_COMMUNITY)
Admission: AD | Admit: 2014-06-08 | Discharge: 2014-06-08 | Disposition: A | Discharge status: Home / Self Care | Payer: Self-pay | Source: Ambulatory Visit | Attending: Obstetrics & Gynecology | Admitting: Obstetrics & Gynecology

## 2014-06-08 ENCOUNTER — Encounter (HOSPITAL_COMMUNITY): Payer: Self-pay | Admitting: General Practice

## 2014-06-08 DIAGNOSIS — O9989 Other specified diseases and conditions complicating pregnancy, childbirth and the puerperium: Principal | ICD-10-CM

## 2014-06-08 DIAGNOSIS — O99891 Other specified diseases and conditions complicating pregnancy: Secondary | ICD-10-CM | POA: Insufficient documentation

## 2014-06-08 DIAGNOSIS — R109 Unspecified abdominal pain: Secondary | ICD-10-CM | POA: Insufficient documentation

## 2014-06-08 DIAGNOSIS — R102 Pelvic and perineal pain: Secondary | ICD-10-CM

## 2014-06-08 DIAGNOSIS — O26899 Other specified pregnancy related conditions, unspecified trimester: Secondary | ICD-10-CM

## 2014-06-08 DIAGNOSIS — N949 Unspecified condition associated with female genital organs and menstrual cycle: Secondary | ICD-10-CM

## 2014-06-08 LAB — GC/CHLAMYDIA PROBE AMP
CT PROBE, AMP APTIMA: NEGATIVE
GC PROBE AMP APTIMA: NEGATIVE

## 2014-06-08 LAB — HCG, QUANTITATIVE, PREGNANCY: hCG, Beta Chain, Quant, S: 256 m[IU]/mL — ABNORMAL HIGH (ref <5)

## 2014-06-08 MED ORDER — KETOROLAC TROMETHAMINE 60 MG/2ML IM SOLN
60.0000 mg | Freq: Once | INTRAMUSCULAR | Status: AC
  Administered 2014-06-08: 60 mg via INTRAMUSCULAR
  Filled 2014-06-08: qty 2

## 2014-06-08 MED ORDER — OXYCODONE-ACETAMINOPHEN 5-325 MG PO TABS
1.0000 | ORAL_TABLET | ORAL | Status: AC
  Administered 2014-06-08: 1 via ORAL
  Filled 2014-06-08: qty 1

## 2014-06-08 MED ORDER — KETOROLAC TROMETHAMINE 10 MG PO TABS: 10.0000 mg | ORAL_TABLET | Freq: Four times a day (QID) | ORAL | Status: DC | PRN

## 2014-06-08 MED ORDER — ONDANSETRON HCL 4 MG PO TABS
8.0000 mg | ORAL_TABLET | Freq: Once | ORAL | Status: AC
  Administered 2014-06-08: 8 mg via ORAL
  Filled 2014-06-08: qty 2

## 2014-06-08 NOTE — MAU Provider Note (Signed)
History     CSN: [id number]  Arrival date and time: 06/08/14 1143   First Provider Initiated Contact with Patient 06/08/14 1211      Chief Complaint  Patient presents with  . Abdominal Pain   HPI Tanya [name] 33 y.o. [id number] @ Unknown gestational age presents to MAU with severe left sided lower abdominal pain that started Tuesday.  She was seen at [facility] ED yesterday and found to have a positive pregnancy test.  She had a BTL nearly 13 years ago.  Ectopic work up yesterday was negative.  U/S negative.  Pain worse today.  Spotting noted on wiping with brown blood - worse this am than now.  She is having dysuria new today.   Denies fever, nausea, vomiting today.    OB History   Grav Para Term Preterm Abortions TAB SAB Ect Mult Living   _0 Past Medical History  Diagnosis Date  . Migraine     Past Surgical History  Procedure Laterality Date  . Hand surgery    . Tubal ligation    . Cesarean section    . Knee surgery    . Cholecystectomy      Family History  Problem Relation Age of Onset  . Cancer Other   . Stroke Other   . Diabetes Other   . Coronary artery disease Other   . Breast cancer Mother 89  . Prostate cancer Father 23  . Breast cancer Maternal Aunt     dx in her 28s; BRCA neg  . Prostate cancer Paternal Uncle   . Prostate cancer Paternal Grandfather   . Ovarian cancer Sister 76    found during her pregnancy  . Prostate cancer Paternal Uncle   . Prostate cancer Paternal Uncle     History  Substance Use Topics  . Smoking status: Never Smoker   . Smokeless tobacco: Not on file  . Alcohol Use: Yes     Comment: socially    Allergies:  Allergies  Allergen Reactions  . Imitrex [Sumatriptan] Shortness Of Breath  . Zomig [Zolmitriptan] Shortness Of Breath  . Latex Hives and Swelling  . Sulfa Antibiotics Other (See Comments)    Unknown. Since childhood.     Prescriptions prior to admission  Medication Sig Dispense Refill  .  ondansetron (ZOFRAN) 4 MG tablet Take 1 tablet (4 mg total) by mouth every 6 (six) hours.  15 tablet  0  . oxyCODONE-acetaminophen (PERCOCET/ROXICET) 5-325 MG per tablet Take 1 tablet by mouth every 6 (six) hours as needed for moderate pain or severe pain.  15 tablet  0  . topiramate (TOPAMAX) 100 MG tablet Take 100-200 mg by mouth 2 (two) times daily. Takes 141m in the morning and 2043mat bedtime        ROS  Pertinent ROS in HPI Physical Exam   Last menstrual period 03/06/2014.  Physical Exam  Constitutional: She is oriented to person, place, and time. She appears well-developed and well-nourished. No distress.  HENT:  Head: Normocephalic.  Eyes: EOM are normal.  Neck: Normal range of motion.  Cardiovascular: Normal rate, regular rhythm and normal heart sounds.   Respiratory: Effort normal and breath sounds normal. No respiratory distress.  GI: Soft. Bowel sounds are normal. She exhibits no distension. There is tenderness.  LLQ tender  Genitourinary:  Dark brown discharge/blood noted in vault.  No pooling.  No clots.  No bright  red blood.   No CMT Adnexa tender on left.  Musculoskeletal: Normal range of motion.  Neurological: She is alert and oriented to person, place, and time.  Skin: Skin is warm and dry.  Psychiatric: She has a normal mood and affect.    MAU Course  Procedures  MDM Percocet given for pain - not effective.   Discussed with Dr. Roselie Awkward.  Advised to get repeat quant and U/S.  Quant decreasing.  U/S review with Dr. Roselie Awkward.  Advised to give Toradol in MAU as well as po Toradol for home.    Assessment and Plan  A: Abdominal pain in pregnancy  P: Discharge to home Toradol 69m po prn Return to MAU in 48 hours to recheck quant Return to MAU for emergency/ uncontrolled pain/bleeding > 1pad per hr/etc. Give list of ob/gyn providers to establish care/begin contraceptive.    TPaticia Stack9/12/2013, 12:17 PM

## 2014-06-08 NOTE — MAU Note (Signed)
Pt states she was at [facility] yesterday for abdominal pain. States she is pregnant but she had her tubes tied 13 years ago. Pt states they did an ultrasound and pelvic there and told her to come here if she had any further pain. Pt states that today the pain is worse.

## 2014-06-08 NOTE — Discharge Instructions (Signed)

## 2014-06-09 ENCOUNTER — Emergency Department (HOSPITAL_COMMUNITY): Payer: Medicaid Other

## 2014-06-09 ENCOUNTER — Ambulatory Visit (HOSPITAL_COMMUNITY)
Admission: EM | Admit: 2014-06-09 | Discharge: 2014-06-10 | Disposition: A | Discharge status: Home / Self Care | Payer: Medicaid Other | Attending: Obstetrics & Gynecology | Admitting: Obstetrics & Gynecology

## 2014-06-09 ENCOUNTER — Encounter (HOSPITAL_COMMUNITY): Payer: Self-pay | Admitting: Emergency Medicine

## 2014-06-09 DIAGNOSIS — O00102 Left tubal pregnancy without intrauterine pregnancy: Secondary | ICD-10-CM

## 2014-06-09 DIAGNOSIS — O00109 Unspecified tubal pregnancy without intrauterine pregnancy: Secondary | ICD-10-CM | POA: Insufficient documentation

## 2014-06-09 DIAGNOSIS — Z3490 Encounter for supervision of normal pregnancy, unspecified, unspecified trimester: Secondary | ICD-10-CM

## 2014-06-09 DIAGNOSIS — K661 Hemoperitoneum: Secondary | ICD-10-CM | POA: Diagnosis present

## 2014-06-09 DIAGNOSIS — R1032 Left lower quadrant pain: Secondary | ICD-10-CM

## 2014-06-09 DIAGNOSIS — Z6841 Body Mass Index (BMI) 40.0 and over, adult: Secondary | ICD-10-CM | POA: Insufficient documentation

## 2014-06-09 HISTORY — DX: Anxiety disorder, unspecified: F41.9

## 2014-06-09 LAB — CBC WITH DIFFERENTIAL/PLATELET
Basophils Absolute: 0 10*3/uL (ref 0.0–0.1)
Basophils Relative: 0 % (ref 0–1)
EOS PCT: 1 % (ref 0–5)
Eosinophils Absolute: 0.1 10*3/uL (ref 0.0–0.7)
HCT: 35.2 % — ABNORMAL LOW (ref 36.0–46.0)
Hemoglobin: 11.4 g/dL — ABNORMAL LOW (ref 12.0–15.0)
LYMPHS ABS: 1.8 10*3/uL (ref 0.7–4.0)
LYMPHS PCT: 15 % (ref 12–46)
MCH: 24.7 pg — AB (ref 26.0–34.0)
MCHC: 32.4 g/dL (ref 30.0–36.0)
MCV: 76.4 fL — ABNORMAL LOW (ref 78.0–100.0)
Monocytes Absolute: 0.8 10*3/uL (ref 0.1–1.0)
Monocytes Relative: 7 % (ref 3–12)
NEUTROS PCT: 77 % (ref 43–77)
Neutro Abs: 9.4 10*3/uL — ABNORMAL HIGH (ref 1.7–7.7)
Platelets: 466 10*3/uL — ABNORMAL HIGH (ref 150–400)
RBC: 4.61 MIL/uL (ref 3.87–5.11)
RDW: 14 % (ref 11.5–15.5)
WBC: 12.1 10*3/uL — ABNORMAL HIGH (ref 4.0–10.5)

## 2014-06-09 LAB — COMPREHENSIVE METABOLIC PANEL
ALK PHOS: 57 U/L (ref 39–117)
ALT: 46 U/L — AB (ref 0–35)
ANION GAP: 14 (ref 5–15)
AST: 23 U/L (ref 0–37)
Albumin: 3.5 g/dL (ref 3.5–5.2)
BUN: 13 mg/dL (ref 6–23)
CO2: 20 meq/L (ref 19–32)
Calcium: 8.6 mg/dL (ref 8.4–10.5)
Chloride: 104 mEq/L (ref 96–112)
Creatinine, Ser: 0.75 mg/dL (ref 0.50–1.10)
GLUCOSE: 103 mg/dL — AB (ref 70–99)
POTASSIUM: 4.2 meq/L (ref 3.7–5.3)
Sodium: 138 mEq/L (ref 137–147)
Total Bilirubin: <0.2 mg/dL — ABNORMAL LOW (ref 0.3–1.2)
Total Protein: 7 g/dL (ref 6.0–8.3)

## 2014-06-09 LAB — URINALYSIS, ROUTINE W REFLEX MICROSCOPIC
BILIRUBIN URINE: NEGATIVE
GLUCOSE, UA: NEGATIVE mg/dL
KETONES UR: NEGATIVE mg/dL
Leukocytes, UA: NEGATIVE
Nitrite: NEGATIVE
Protein, ur: NEGATIVE mg/dL
Specific Gravity, Urine: 1.019 (ref 1.005–1.030)
UROBILINOGEN UA: 1 mg/dL (ref 0.0–1.0)
pH: 8 (ref 5.0–8.0)

## 2014-06-09 LAB — URINE MICROSCOPIC-ADD ON

## 2014-06-09 LAB — HCG, QUANTITATIVE, PREGNANCY
HCG, BETA CHAIN, QUANT, S: 197 m[IU]/mL — AB (ref <5)
hCG, Beta Chain, Quant, S: 222 m[IU]/mL — ABNORMAL HIGH (ref <5)

## 2014-06-09 LAB — POC OCCULT BLOOD, ED: Fecal Occult Bld: NEGATIVE

## 2014-06-09 LAB — PREGNANCY, URINE: Preg Test, Ur: POSITIVE — AB

## 2014-06-09 MED ORDER — MORPHINE SULFATE 4 MG/ML IJ SOLN
4.0000 mg | Freq: Once | INTRAMUSCULAR | Status: AC
  Administered 2014-06-09: 4 mg via INTRAVENOUS
  Filled 2014-06-09: qty 1

## 2014-06-09 MED ORDER — ONDANSETRON HCL 4 MG/2ML IJ SOLN
4.0000 mg | Freq: Once | INTRAMUSCULAR | Status: AC
  Administered 2014-06-09: 4 mg via INTRAVENOUS
  Filled 2014-06-09: qty 2

## 2014-06-09 MED ORDER — SODIUM CHLORIDE 0.9 % IV BOLUS (SEPSIS)
2000.0000 mL | Freq: Once | INTRAVENOUS | Status: AC
  Administered 2014-06-09: 2000 mL via INTRAVENOUS

## 2014-06-09 NOTE — ED Notes (Signed)
Pt presents with ongoing LLQ pain radiating to RLQ region since Tuesday the 2nd, and yesterday at [facility] for the same. Pt reports her blood levels for pregnancy have decreased since Tuesday per results at [place] reports she had a tubal ligation 13 years ago. Pt reports had a large BM this am with bright red blood in the touilet bowel from her rectum and brown vaginal discharge that also has been ongoing.

## 2014-06-09 NOTE — ED Provider Notes (Signed)
CSN: [id number]     Arrival date & time 06/09/14  1505 History   First MD Initiated Contact with Patient 06/09/14 1724     Chief Complaint  Patient presents with  . Abdominal Pain     (Consider location/radiation/quality/duration/timing/severity/associated sxs/prior Treatment) The history is provided by the patient and medical records. No language interpreter was used.    Tanya [name] is a 33 y.o. female  with a hx of BTL 13 years ago, migraine headaches, cholecystectomy presents to the Emergency Department complaining of gradual, persistent, progressively worsening LLQ abd pain onset 3 days ago.  Pt was found to be pregnant 3 days ago without visualization of the fetus on [place].  Pt was re-evaluated yesterday in the MAU and her HCG level had dropped.  Pt with Hx of hemorrhoids, but large BM today produced BRBPR.  Associated symptoms include brown vaginal bleeding since Sept 2nd, nausea and vomiting this morning.  Pt reports the initial vaginal bleeding was bright red lasting for 24 hours.  Pt has been taking anti-inflammatories without relief.  Nothing makes it better and movement makes it worse.  Pt denies fever, chills, headache, neck pain, chest pain, SOB, diarrhea, weakness, syncope.  Pt reports the constant pain is achy and pulsating with intermittent 10/10 stabbing pain in the LLQ.      LMP: 1 week ago, heavy like usual  Past Medical History  Diagnosis Date  . Migraine    Past Surgical History  Procedure Laterality Date  . Hand surgery    . Tubal ligation    . Cesarean section    . Knee surgery    . Cholecystectomy     Family History  Problem Relation Age of Onset  . Cancer Other   . Stroke Other   . Diabetes Other   . Coronary artery disease Other   . Breast cancer Mother 27  . Prostate cancer Father 2  . Breast cancer Maternal Aunt     dx in her 28s; BRCA neg  . Prostate cancer Paternal Uncle   . Prostate cancer Paternal Grandfather   . Ovarian cancer Sister 6     found during her pregnancy  . Prostate cancer Paternal Uncle   . Prostate cancer Paternal Uncle    History  Substance Use Topics  . Smoking status: Never Smoker   . Smokeless tobacco: Not on file  . Alcohol Use: Yes     Comment: socially   OB History   Grav Para Term Preterm Abortions TAB SAB Ect Mult Living   '3 2 1       2     ' Review of Systems  Constitutional: Negative for fever, diaphoresis, appetite change, fatigue and unexpected weight change.  HENT: Negative for mouth sores and trouble swallowing.   Respiratory: Negative for cough, chest tightness, shortness of breath, wheezing and stridor.   Cardiovascular: Negative for chest pain and palpitations.  Gastrointestinal: Positive for nausea and abdominal pain. Negative for vomiting, diarrhea, constipation, blood in stool, abdominal distention and rectal pain.  Genitourinary: Positive for pelvic pain. Negative for dysuria, urgency, frequency, hematuria, flank pain and difficulty urinating.  Musculoskeletal: Negative for back pain, neck pain and neck stiffness.  Skin: Negative for rash.  Neurological: Negative for weakness.  Hematological: Negative for adenopathy.  Psychiatric/Behavioral: Negative for confusion.  All other systems reviewed and are negative.     Allergies  Imitrex; Zomig; Latex; and Sulfa antibiotics  Home Medications   Prior to Admission medications   Medication  Sig Start Date End Date Taking? Authorizing Provider  acetaminophen (TYLENOL) 325 MG tablet Take 650 mg by mouth every 6 (six) hours as needed for moderate pain.   Yes Historical Provider, MD  ALPRAZolam Duanne Moron) 1 MG tablet Take 1 mg by mouth daily as needed for anxiety or sleep.   Yes Historical Provider, MD  topiramate (TOPAMAX) 100 MG tablet Take 100-200 mg by mouth 2 (two) times daily. Takes 167m in the morning and 2017mat bedtime   Yes Historical Provider, MD   BP 113/74  Pulse 100  Temp(Src) 98.7 F (37.1 C) (Oral)  Resp 28  SpO2 98%   LMP 05/27/2014 Physical Exam  Nursing note and vitals reviewed. Constitutional: She appears well-developed and well-nourished. No distress.  HENT:  Head: Normocephalic and atraumatic.  Mouth/Throat: Oropharynx is clear and moist.  Eyes: Conjunctivae are normal. No scleral icterus.  Neck: Normal range of motion.  Cardiovascular: Regular rhythm, normal heart sounds and intact distal pulses.   No murmur heard. tachycardia  Pulmonary/Chest: Effort normal and breath sounds normal. No respiratory distress. She has no wheezes.  Clear and equal breath sounds  Abdominal: Soft. Bowel sounds are normal. She exhibits no distension and no mass. There is tenderness in the suprapubic area and left lower quadrant. There is guarding. There is no rebound and no CVA tenderness. Hernia confirmed negative in the right inguinal area and confirmed negative in the left inguinal area.  LLQ abd pain  Genitourinary: Uterus normal. Rectal exam shows external hemorrhoid and tenderness (at hemorrhoid). No labial fusion. There is no rash, tenderness or lesion on the right labia. There is no rash, tenderness or lesion on the left labia. Uterus is not deviated, not enlarged, not fixed and not tender. Cervix exhibits no motion tenderness, no discharge and no friability. Right adnexum displays no mass, no tenderness and no fullness. Left adnexum displays tenderness. Left adnexum displays no mass and no fullness. No erythema, tenderness or bleeding around the vagina. No foreign body around the vagina. No signs of injury around the vagina. Vaginal discharge ( brown, thick) found.  Exquisite tenderness in the left adnexa External hemorrhoid without visible blood  Musculoskeletal: Normal range of motion. She exhibits no edema.  Lymphadenopathy:       Right: No inguinal adenopathy present.       Left: No inguinal adenopathy present.  Neurological: She is alert.  Skin: Skin is warm and dry. She is not diaphoretic. No erythema.   Psychiatric: She has a normal mood and affect.    ED Course  Procedures (including critical care time) Labs Review Labs Reviewed  CBC WITH DIFFERENTIAL - Abnormal; Notable for the following:    WBC 12.1 (*)    Hemoglobin 11.4 (*)    HCT 35.2 (*)    MCV 76.4 (*)    MCH 24.7 (*)    Platelets 466 (*)    Neutro Abs 9.4 (*)    All other components within normal limits  COMPREHENSIVE METABOLIC PANEL - Abnormal; Notable for the following:    Glucose, Bld 103 (*)    ALT 46 (*)    Total Bilirubin <0.2 (*)    All other components within normal limits  PREGNANCY, URINE - Abnormal; Notable for the following:    Preg Test, Ur POSITIVE (*)    All other components within normal limits  URINALYSIS, ROUTINE W REFLEX MICROSCOPIC - Abnormal; Notable for the following:    APPearance CLOUDY (*)    Hgb urine dipstick SMALL (*)  All other components within normal limits  HCG, QUANTITATIVE, PREGNANCY - Abnormal; Notable for the following:    hCG, Beta Chain, Quant, S 222 (*)    All other components within normal limits  URINE MICROSCOPIC-ADD ON - Abnormal; Notable for the following:    Squamous Epithelial / LPF FEW (*)    Bacteria, UA FEW (*)    All other components within normal limits  HCG, QUANTITATIVE, PREGNANCY - Abnormal; Notable for the following:    hCG, Beta Chain, Quant, S 197 (*)    All other components within normal limits  POC OCCULT BLOOD, ED    Imaging Review US Ob Comp Less 14 Wks  06/09/2014   CLINICAL DATA:  Abdominal pain. Positive pregnancy test, with quantitative beta HCG level of 222.  EXAM: OBSTETRIC <14 WK [place] AND TRANSVAGINAL OB US  TECHNIQUE: Both transabdominal and transvaginal ultrasound examinations were performed for complete evaluation of the gestation as well as the maternal uterus, adnexal regions, and pelvic cul-de-sac. Transvaginal technique was performed to assess early pregnancy.  COMPARISON:  06/08/2014  FINDINGS: No intrauterine gestational sac or other  fluid collection visualized in endometrial cavity. No fibroids identified. Prior C-section scar noted. Endometrial thickness measures 13 mm.  Both ovaries are normal in appearance. No adnexal mass identified. Small amount of free fluid noted in pelvic cul-de-sac, without significant change compared to prior exam.  IMPRESSION: No IUP or adnexal mass visualized. Differential diagnosis still includes recent spontaneous abortion, IUP too early to visualize, and occult ectopic pregnancy. Recommend close follow up of quantitative B-HCG levels, and follow up US as clinically warranted.   Electronically Signed   By: Earle Gell M.D.   On: 06/09/2014 23:18   US Ob Transvaginal  06/09/2014   CLINICAL DATA:  Abdominal pain. Positive pregnancy test, with quantitative beta HCG level of 222.  EXAM: OBSTETRIC <14 WK [place] AND TRANSVAGINAL OB US  TECHNIQUE: Both transabdominal and transvaginal ultrasound examinations were performed for complete evaluation of the gestation as well as the maternal uterus, adnexal regions, and pelvic cul-de-sac. Transvaginal technique was performed to assess early pregnancy.  COMPARISON:  06/08/2014  FINDINGS: No intrauterine gestational sac or other fluid collection visualized in endometrial cavity. No fibroids identified. Prior C-section scar noted. Endometrial thickness measures 13 mm.  Both ovaries are normal in appearance. No adnexal mass identified. Small amount of free fluid noted in pelvic cul-de-sac, without significant change compared to prior exam.  IMPRESSION: No IUP or adnexal mass visualized. Differential diagnosis still includes recent spontaneous abortion, IUP too early to visualize, and occult ectopic pregnancy. Recommend close follow up of quantitative B-HCG levels, and follow up US as clinically warranted.   Electronically Signed   By: Earle Gell M.D.   On: 06/09/2014 23:18   US Ob Transvaginal  06/08/2014   CLINICAL DATA:  Left lower quadrant pain.  Positive pregnancy test.   EXAM: OBSTETRIC <14 WK [place] AND TRANSVAGINAL OB US  TECHNIQUE: Both transabdominal and transvaginal ultrasound examinations were performed for complete evaluation of the gestation as well as the maternal uterus, adnexal regions, and pelvic cul-de-sac. Transvaginal technique was performed to assess early pregnancy.  COMPARISON:  None.  FINDINGS: Intrauterine gestational sac: None visualized.  Yolk sac:  None visualized.  Embryo:  None visualized.  Cardiac Activity: None visualized.  Maternal uterus/adnexae: Adnexal nodes difficult to evaluate due to overlying bowel gas. Multiple tiny cyst left ovary, most likely physiologic. Small amount of adjacent fluid is noted.  IMPRESSION: 1. No intrauterine pregnancy  noted. 2. Multiple tiny cysts left ovary, most likely physiologic. Small amount of fluid is noted adjacent to the left ovary. Continued follow-up to evaluate for developing IUP and to exclude ectopic pregnancy suggested.   Electronically Signed   By: Marcello Moores  Register   On: 06/08/2014 15:18     EKG Interpretation None      MDM   Final diagnoses:  LLQ abdominal pain  Pregnancy   Octavio Manns presents with LLQ abd pain, positive pregnancy test with Hx of BTL.  Quant 615 two days ago, 94 yesterday.   US pelvis x2 has been unable to locate a fetus.  Significant concern remains for possible ectopic pregnancy.  Slight decrease in hemoglobin from 12.6 to 11.4. Her blood cell count 12.1. Beta hCG 197.  Patient with decreasing crit however she still has significant pain.  10:10 PM Discussed with Silas Sacramento, MD [facility]) who recommends repeat US for evaluation of possible ectopic.    11:54 PM [place] without discrete ectopic pregnancy.  Discussed with Dr. Demetrio Lapping again who recommends CT scan.    1:20 AM Ct scan pending.  Will also obtain Pelvic doppler to r/o ovarian torsion.    Patient discussed with Julianne Rice, MD who will follow and dispo in conjunction with Silas Sacramento, MD.    Abigail Butts, PA-C 06/10/14 2574

## 2014-06-09 NOTE — ED Notes (Signed)
Patient C/O left groin pain that has been present since Tuesday. States that she has been seen at [facility] yesterday.  States that she was told that she was pregnant and also reports that she had a tubal ligation 13 years ago. Pain is a dull ache but is occasionally sharp that doubles her over.  She reports red blood in her stool. Also report brown blood in her vagina.

## 2014-06-10 ENCOUNTER — Encounter (HOSPITAL_COMMUNITY): Payer: Medicaid Other | Admitting: Anesthesiology

## 2014-06-10 ENCOUNTER — Emergency Department (HOSPITAL_COMMUNITY): Payer: Medicaid Other

## 2014-06-10 ENCOUNTER — Encounter (HOSPITAL_COMMUNITY)
Admission: EM | Disposition: A | Discharge status: Home / Self Care | Payer: Self-pay | Source: Home / Self Care | Attending: Emergency Medicine

## 2014-06-10 ENCOUNTER — Encounter (HOSPITAL_COMMUNITY): Payer: Self-pay | Admitting: *Deleted

## 2014-06-10 ENCOUNTER — Emergency Department (HOSPITAL_COMMUNITY): Payer: Medicaid Other | Admitting: Anesthesiology

## 2014-06-10 DIAGNOSIS — O00102 Left tubal pregnancy without intrauterine pregnancy: Secondary | ICD-10-CM

## 2014-06-10 DIAGNOSIS — R1032 Left lower quadrant pain: Secondary | ICD-10-CM

## 2014-06-10 DIAGNOSIS — O00109 Unspecified tubal pregnancy without intrauterine pregnancy: Secondary | ICD-10-CM | POA: Diagnosis not present

## 2014-06-10 DIAGNOSIS — K661 Hemoperitoneum: Secondary | ICD-10-CM | POA: Diagnosis present

## 2014-06-10 DIAGNOSIS — Z6841 Body Mass Index (BMI) 40.0 and over, adult: Secondary | ICD-10-CM | POA: Diagnosis not present

## 2014-06-10 HISTORY — PX: LAPAROSCOPIC UNILATERAL SALPINGECTOMY: SHX5934

## 2014-06-10 LAB — CBC
HCT: 33.7 % — ABNORMAL LOW (ref 36.0–46.0)
Hemoglobin: 10.7 g/dL — ABNORMAL LOW (ref 12.0–15.0)
MCH: 24.8 pg — ABNORMAL LOW (ref 26.0–34.0)
MCHC: 31.8 g/dL (ref 30.0–36.0)
MCV: 78 fL (ref 78.0–100.0)
PLATELETS: 392 10*3/uL (ref 150–400)
RBC: 4.32 MIL/uL (ref 3.87–5.11)
RDW: 14.2 % (ref 11.5–15.5)
WBC: 10.1 10*3/uL (ref 4.0–10.5)

## 2014-06-10 SURGERY — SALPINGECTOMY, UNILATERAL, LAPAROSCOPIC
Anesthesia: General | Site: Abdomen | Laterality: Left

## 2014-06-10 MED ORDER — IOHEXOL 300 MG/ML  SOLN
25.0000 mL | Freq: Once | INTRAMUSCULAR | Status: AC | PRN
  Administered 2014-06-10: 25 mL via ORAL

## 2014-06-10 MED ORDER — LIDOCAINE HCL (CARDIAC) 20 MG/ML IV SOLN
INTRAVENOUS | Status: AC
  Filled 2014-06-10: qty 5

## 2014-06-10 MED ORDER — PROMETHAZINE HCL 25 MG/ML IJ SOLN: 6.2500 mg | INTRAMUSCULAR | Status: DC | PRN

## 2014-06-10 MED ORDER — MEPERIDINE HCL 25 MG/ML IJ SOLN: 6.2500 mg | INTRAMUSCULAR | Status: DC | PRN

## 2014-06-10 MED ORDER — LIDOCAINE HCL (CARDIAC) 20 MG/ML IV SOLN
INTRAVENOUS | Status: DC | PRN
  Administered 2014-06-10: 80 mg via INTRAVENOUS

## 2014-06-10 MED ORDER — PROPOFOL 10 MG/ML IV EMUL
INTRAVENOUS | Status: AC
  Filled 2014-06-10: qty 20

## 2014-06-10 MED ORDER — NEOSTIGMINE METHYLSULFATE 10 MG/10ML IV SOLN
INTRAVENOUS | Status: DC | PRN
  Administered 2014-06-10: 4 mg via INTRAVENOUS

## 2014-06-10 MED ORDER — GLYCOPYRROLATE 0.2 MG/ML IJ SOLN
INTRAMUSCULAR | Status: DC | PRN
  Administered 2014-06-10: 0.6 mg via INTRAVENOUS
  Administered 2014-06-10: 0.2 mg via INTRAVENOUS

## 2014-06-10 MED ORDER — FENTANYL CITRATE 0.05 MG/ML IJ SOLN
INTRAMUSCULAR | Status: DC | PRN
  Administered 2014-06-10: 50 ug via INTRAVENOUS
  Administered 2014-06-10: 100 ug via INTRAVENOUS
  Administered 2014-06-10: 50 ug via INTRAVENOUS

## 2014-06-10 MED ORDER — ONDANSETRON HCL 4 MG/2ML IJ SOLN
4.0000 mg | Freq: Once | INTRAMUSCULAR | Status: AC
Start: 2014-06-10 — End: 2014-06-10
  Administered 2014-06-10: 4 mg via INTRAVENOUS
  Filled 2014-06-10: qty 2

## 2014-06-10 MED ORDER — DEXAMETHASONE SODIUM PHOSPHATE 10 MG/ML IJ SOLN
INTRAMUSCULAR | Status: DC | PRN
  Administered 2014-06-10: 4 mg via INTRAVENOUS

## 2014-06-10 MED ORDER — PHENYLEPHRINE 40 MCG/ML (10ML) SYRINGE FOR IV PUSH (FOR BLOOD PRESSURE SUPPORT)
PREFILLED_SYRINGE | INTRAVENOUS | Status: AC
  Filled 2014-06-10: qty 5

## 2014-06-10 MED ORDER — SCOPOLAMINE 1 MG/3DAYS TD PT72
MEDICATED_PATCH | TRANSDERMAL | Status: AC
  Filled 2014-06-10: qty 1

## 2014-06-10 MED ORDER — ROCURONIUM BROMIDE 100 MG/10ML IV SOLN
INTRAVENOUS | Status: DC | PRN
  Administered 2014-06-10: 45 mg via INTRAVENOUS
  Administered 2014-06-10: 5 mg via INTRAVENOUS

## 2014-06-10 MED ORDER — DEXAMETHASONE SODIUM PHOSPHATE 10 MG/ML IJ SOLN
INTRAMUSCULAR | Status: AC
  Filled 2014-06-10: qty 1

## 2014-06-10 MED ORDER — METOCLOPRAMIDE HCL 10 MG PO TABS
10.0000 mg | ORAL_TABLET | Freq: Once | ORAL | Status: AC
  Administered 2014-06-10: 10 mg via ORAL
  Filled 2014-06-10: qty 1

## 2014-06-10 MED ORDER — OXYCODONE-ACETAMINOPHEN 5-325 MG PO TABS
2.0000 | ORAL_TABLET | Freq: Four times a day (QID) | ORAL | Status: DC | PRN
  Administered 2014-06-10: 2 via ORAL

## 2014-06-10 MED ORDER — NEOSTIGMINE METHYLSULFATE 10 MG/10ML IV SOLN
INTRAVENOUS | Status: AC
  Filled 2014-06-10: qty 1

## 2014-06-10 MED ORDER — LACTATED RINGERS IV SOLN
INTRAVENOUS | Status: DC
Start: 2014-06-10 — End: 2014-06-10

## 2014-06-10 MED ORDER — LACTATED RINGERS IV SOLN
INTRAVENOUS | Status: DC
  Administered 2014-06-10: 20 mL via INTRAVENOUS
  Administered 2014-06-10 (×2): via INTRAVENOUS

## 2014-06-10 MED ORDER — FAMOTIDINE 20 MG PO TABS
40.0000 mg | ORAL_TABLET | Freq: Once | ORAL | Status: AC
  Administered 2014-06-10: 40 mg via ORAL
  Filled 2014-06-10: qty 2

## 2014-06-10 MED ORDER — MIDAZOLAM HCL 5 MG/5ML IJ SOLN
INTRAMUSCULAR | Status: DC | PRN
  Administered 2014-06-10: 1 mg via INTRAVENOUS

## 2014-06-10 MED ORDER — SCOPOLAMINE 1 MG/3DAYS TD PT72
MEDICATED_PATCH | TRANSDERMAL | Status: DC | PRN
  Administered 2014-06-10: 1 via TRANSDERMAL

## 2014-06-10 MED ORDER — OXYCODONE-ACETAMINOPHEN 5-325 MG PO TABS: 1.0000 | ORAL_TABLET | Freq: Four times a day (QID) | ORAL | Status: DC | PRN

## 2014-06-10 MED ORDER — IOHEXOL 300 MG/ML  SOLN
100.0000 mL | Freq: Once | INTRAMUSCULAR | Status: AC | PRN
  Administered 2014-06-10: 100 mL via INTRAVENOUS

## 2014-06-10 MED ORDER — MIDAZOLAM HCL 2 MG/2ML IJ SOLN
INTRAMUSCULAR | Status: AC
  Filled 2014-06-10: qty 2

## 2014-06-10 MED ORDER — FENTANYL CITRATE 0.05 MG/ML IJ SOLN
INTRAMUSCULAR | Status: AC
  Filled 2014-06-10: qty 5

## 2014-06-10 MED ORDER — PROMETHAZINE HCL 25 MG/ML IJ SOLN
12.5000 mg | Freq: Once | INTRAMUSCULAR | Status: AC
  Administered 2014-06-10: 12.5 mg via INTRAVENOUS
  Filled 2014-06-10: qty 1

## 2014-06-10 MED ORDER — PROPOFOL 10 MG/ML IV BOLUS
INTRAVENOUS | Status: DC | PRN
  Administered 2014-06-10: 200 mg via INTRAVENOUS

## 2014-06-10 MED ORDER — FENTANYL CITRATE 0.05 MG/ML IJ SOLN: 25.0000 ug | INTRAMUSCULAR | Status: DC | PRN

## 2014-06-10 MED ORDER — PHENYLEPHRINE HCL 10 MG/ML IJ SOLN
INTRAMUSCULAR | Status: DC | PRN
  Administered 2014-06-10 (×2): 80 ug via INTRAVENOUS

## 2014-06-10 MED ORDER — IBUPROFEN 600 MG PO TABS: 600.0000 mg | ORAL_TABLET | Freq: Four times a day (QID) | ORAL | Status: DC | PRN

## 2014-06-10 MED ORDER — BUPIVACAINE HCL 0.25 % IJ SOLN: INTRAMUSCULAR | Status: DC | PRN

## 2014-06-10 MED ORDER — HYDROMORPHONE HCL PF 1 MG/ML IJ SOLN
1.0000 mg | Freq: Once | INTRAMUSCULAR | Status: AC
  Administered 2014-06-10: 1 mg via INTRAVENOUS
  Filled 2014-06-10: qty 1

## 2014-06-10 MED ORDER — GLYCOPYRROLATE 0.2 MG/ML IJ SOLN
INTRAMUSCULAR | Status: AC
  Filled 2014-06-10: qty 4

## 2014-06-10 MED ORDER — ONDANSETRON HCL 4 MG/2ML IJ SOLN
INTRAMUSCULAR | Status: AC
  Filled 2014-06-10: qty 2

## 2014-06-10 MED ORDER — MORPHINE SULFATE 4 MG/ML IJ SOLN
4.0000 mg | Freq: Once | INTRAMUSCULAR | Status: AC
  Administered 2014-06-10: 4 mg via INTRAVENOUS
  Filled 2014-06-10: qty 1

## 2014-06-10 SURGICAL SUPPLY — 22 items
BAG SPEC RTRVL LRG 6X4 10 (ENDOMECHANICALS) ×1
DEVICE TROCAR PUNCTURE CLOSURE (ENDOMECHANICALS) ×2 IMPLANT
DRESSING OPSITE X SMALL 2X3 (GAUZE/BANDAGES/DRESSINGS) ×2 IMPLANT
DRSG COVADERM PLUS 2X2 (GAUZE/BANDAGES/DRESSINGS) ×2 IMPLANT
GLOVE BIOGEL PI IND STRL 6.5 (GLOVE) IMPLANT
GLOVE BIOGEL PI IND STRL 7.0 (GLOVE) IMPLANT
GLOVE BIOGEL PI IND STRL 7.5 (GLOVE) IMPLANT
GLOVE BIOGEL PI INDICATOR 6.5 (GLOVE) ×4
GLOVE BIOGEL PI INDICATOR 7.0 (GLOVE) ×4
GLOVE BIOGEL PI INDICATOR 7.5 (GLOVE) ×2
GLOVE SURG SS PI 6.5 STRL IVOR (GLOVE) ×4 IMPLANT
GLOVE SURG SS PI 7.0 STRL IVOR (GLOVE) ×6 IMPLANT
GLOVE SURG SS PI 7.5 STRL IVOR (GLOVE) ×6 IMPLANT
GOWN SURGICAL XLG (GOWNS) ×6 IMPLANT
PACK LAPAROSCOPY BASIN (CUSTOM PROCEDURE TRAY) ×2 IMPLANT
POUCH SPECIMEN RETRIEVAL 10MM (ENDOMECHANICALS) ×2 IMPLANT
SET IRRIG TUBING LAPAROSCOPIC (IRRIGATION / IRRIGATOR) ×2 IMPLANT
SHEARS HARMONIC ACE PLUS 36CM (ENDOMECHANICALS) ×2 IMPLANT
SPONGE GAUZE 2X2 8PLY STER LF (GAUZE/BANDAGES/DRESSINGS) ×1
SPONGE GAUZE 2X2 8PLY STRL LF (GAUZE/BANDAGES/DRESSINGS) ×1 IMPLANT
TROCAR 12M 150ML BLUNT (TROCAR) ×4 IMPLANT
TROCAR 5M 150ML BLDLS (TROCAR) ×4 IMPLANT

## 2014-06-10 NOTE — ED Notes (Signed)
Notified CT pt finished contrast

## 2014-06-10 NOTE — Transfer of Care (Signed)
Immediate Anesthesia Transfer of Care Note  Patient: Tanya [name]  Procedure(s) Performed: Procedure(s): LAPAROSCOPIC Left SALPINGECTOMY, removal of ectopic pregnancy (Left)  Patient Location: PACU  Anesthesia Type:General  Level of Consciousness: awake, alert  and oriented  Airway & Oxygen Therapy: Patient Spontanous Breathing and Patient connected to nasal cannula oxygen  Post-op Assessment: Report given to PACU RN and Post -op Vital signs reviewed and stable  Post vital signs: Reviewed and stable  Complications: No apparent anesthesia complications

## 2014-06-10 NOTE — H&P (Signed)
Tanya [name] is an 33 y.o. female. Who was sent here from ED with a left ectopic pregnancy. Dr Gala Romney plans surgery this morning to treat this. The patient presented with worsening LLQ pain and was found to have a positive pregnancy test. [place] study was negative but subsequent CT scan identified a Left adnexal mass, believed to be an ectopic pregnancy. She has a history of a BTL 13 years ago.   Pertinent Gynecological History:  Bleeding: Initially red bleeding, now brown, since 9/2 Contraception: tubal ligation DES exposure: unknown Blood transfusions: none Sexually transmitted diseases: no past history Previous GYN Procedures: BTL  Last mammogram: n/a Date:   OB History: G3, P1002   Menstrual History: Patient's last menstrual period was 05/28/2014.    Past Medical History  Diagnosis Date  . Migraine   . Anxiety     Past Surgical History  Procedure Laterality Date  . Hand surgery    . Tubal ligation    . Cesarean section    . Knee surgery    . Cholecystectomy    . Wisdom tooth extraction      Family History  Problem Relation Age of Onset  . Cancer Other   . Stroke Other   . Diabetes Other   . Coronary artery disease Other   . Breast cancer Mother 39  . Prostate cancer Father 66  . Breast cancer Maternal Aunt     dx in her 50s; BRCA neg  . Prostate cancer Paternal Uncle   . Prostate cancer Paternal Grandfather   . Ovarian cancer Sister 33    found during her pregnancy  . Prostate cancer Paternal Uncle   . Prostate cancer Paternal Uncle     Social History:  reports that she has never smoked. She does not have any smokeless tobacco history on file. She reports that she drinks alcohol. She reports that she does not use illicit drugs.  Allergies:  Allergies  Allergen Reactions  . Imitrex [Sumatriptan] Shortness Of Breath  . Zomig [Zolmitriptan] Shortness Of Breath  . Latex Hives and Swelling  . Sulfa Antibiotics Other (See Comments)    Unknown. Since  childhood.     Prescriptions prior to admission  Medication Sig Dispense Refill  . acetaminophen (TYLENOL) 325 MG tablet Take 650 mg by mouth every 6 (six) hours as needed for moderate pain.      Marland Kitchen ALPRAZolam (XANAX) 1 MG tablet Take 1 mg by mouth daily as needed for anxiety or sleep.      Marland Kitchen topiramate (TOPAMAX) 100 MG tablet Take 100-200 mg by mouth 2 (two) times daily. Takes 171m in the morning and 2030mat bedtime        Review of Systems  Constitutional: Negative for fever, chills and malaise/fatigue.  Gastrointestinal: Positive for nausea, abdominal pain and blood in stool (related to hemorrhoids). Negative for vomiting, diarrhea and constipation.  Genitourinary: Negative for dysuria.  Neurological: Negative for dizziness and headaches.    Blood pressure 116/56, pulse 92, temperature 98 F (36.7 C), temperature source Oral, resp. rate 20, last menstrual period 05/28/2014, SpO2 99.00%. Physical Exam  Constitutional: She is oriented to person, place, and time. She appears well-developed and well-nourished. No distress.  HENT:  Head: Normocephalic.  Cardiovascular: Regular rhythm and normal heart sounds.  Exam reveals no gallop and no friction rub.   No murmur (tachycardia) heard. Respiratory: Effort normal and breath sounds normal. No respiratory distress. She has no wheezes. She has no rales. She exhibits no tenderness.  GI: Soft. She exhibits no distension (exam limited by habitus and pain). There is tenderness. There is no rebound and no guarding.  Genitourinary: Vaginal discharge (thick, brown according to ED physical exam) found.  Musculoskeletal: Normal range of motion.  Neurological: She is alert and oriented to person, place, and time.  Skin: Skin is warm and dry.  Psychiatric: She has a normal mood and affect.    Results for orders placed during the hospital encounter of 06/09/14 (from the past 24 hour(s))  CBC WITH DIFFERENTIAL     Status: Abnormal   Collection Time     06/09/14  4:11 PM      Result Value Ref Range   WBC 12.1 (*) 4.0 - 10.5 K/uL   RBC 4.61  3.87 - 5.11 MIL/uL   Hemoglobin 11.4 (*) 12.0 - 15.0 g/dL   HCT 35.2 (*) 36.0 - 46.0 %   MCV 76.4 (*) 78.0 - 100.0 fL   MCH 24.7 (*) 26.0 - 34.0 pg   MCHC 32.4  30.0 - 36.0 g/dL   RDW 14.0  11.5 - 15.5 %   Platelets 466 (*) 150 - 400 K/uL   Neutrophils Relative % 77  43 - 77 %   Neutro Abs 9.4 (*) 1.7 - 7.7 K/uL   Lymphocytes Relative 15  12 - 46 %   Lymphs Abs 1.8  0.7 - 4.0 K/uL   Monocytes Relative 7  3 - 12 %   Monocytes Absolute 0.8  0.1 - 1.0 K/uL   Eosinophils Relative 1  0 - 5 %   Eosinophils Absolute 0.1  0.0 - 0.7 K/uL   Basophils Relative 0  0 - 1 %   Basophils Absolute 0.0  0.0 - 0.1 K/uL  COMPREHENSIVE METABOLIC PANEL     Status: Abnormal   Collection Time    06/09/14  4:11 PM      Result Value Ref Range   Sodium 138  137 - 147 mEq/L   Potassium 4.2  3.7 - 5.3 mEq/L   Chloride 104  96 - 112 mEq/L   CO2 20  19 - 32 mEq/L   Glucose, Bld 103 (*) 70 - 99 mg/dL   BUN 13  6 - 23 mg/dL   Creatinine, Ser 0.75  0.50 - 1.10 mg/dL   Calcium 8.6  8.4 - 10.5 mg/dL   Total Protein 7.0  6.0 - 8.3 g/dL   Albumin 3.5  3.5 - 5.2 g/dL   AST 23  0 - 37 U/L   ALT 46 (*) 0 - 35 U/L   Alkaline Phosphatase 57  39 - 117 U/L   Total Bilirubin <0.2 (*) 0.3 - 1.2 mg/dL   GFR calc non Af Amer >90  >90 mL/min   GFR calc Af Amer >90  >90 mL/min   Anion gap 14  5 - 15  HCG, QUANTITATIVE, PREGNANCY     Status: Abnormal   Collection Time    06/09/14  4:11 PM      Result Value Ref Range   hCG, Beta Chain, Quant, S 222 (*) <5 mIU/mL  PREGNANCY, URINE     Status: Abnormal   Collection Time    06/09/14  5:14 PM      Result Value Ref Range   Preg Test, Ur POSITIVE (*) NEGATIVE  URINALYSIS, ROUTINE W REFLEX MICROSCOPIC     Status: Abnormal   Collection Time    06/09/14  5:14 PM      Result Value Ref Range  Color, Urine YELLOW  YELLOW   APPearance CLOUDY (*) CLEAR   Specific Gravity, Urine  1.019  1.005 - 1.030   pH 8.0  5.0 - 8.0   Glucose, UA NEGATIVE  NEGATIVE mg/dL   Hgb urine dipstick SMALL (*) NEGATIVE   Bilirubin Urine NEGATIVE  NEGATIVE   Ketones, ur NEGATIVE  NEGATIVE mg/dL   Protein, ur NEGATIVE  NEGATIVE mg/dL   Urobilinogen, UA 1.0  0.0 - 1.0 mg/dL   Nitrite NEGATIVE  NEGATIVE   Leukocytes, UA NEGATIVE  NEGATIVE  URINE MICROSCOPIC-ADD ON     Status: Abnormal   Collection Time    06/09/14  5:14 PM      Result Value Ref Range   Squamous Epithelial / LPF FEW (*) RARE   WBC, UA 0-2  <3 WBC/hpf   RBC / HPF 0-2  <3 RBC/hpf   Bacteria, UA FEW (*) RARE   Urine-Other MUCOUS PRESENT    POC OCCULT BLOOD, ED     Status: None   Collection Time    06/09/14  7:07 PM      Result Value Ref Range   Fecal Occult Bld NEGATIVE  NEGATIVE  HCG, QUANTITATIVE, PREGNANCY     Status: Abnormal   Collection Time    06/09/14  7:59 PM      Result Value Ref Range   hCG, Beta Chain, Quant, S 197 (*) <5 mIU/mL    US Ob Comp Less 14 Wks  06/09/2014   CLINICAL DATA:  Abdominal pain. Positive pregnancy test, with quantitative beta HCG level of 222.  EXAM: OBSTETRIC <14 WK [place] AND TRANSVAGINAL OB US  TECHNIQUE: Both transabdominal and transvaginal ultrasound examinations were performed for complete evaluation of the gestation as well as the maternal uterus, adnexal regions, and pelvic cul-de-sac. Transvaginal technique was performed to assess early pregnancy.  COMPARISON:  06/08/2014  FINDINGS: No intrauterine gestational sac or other fluid collection visualized in endometrial cavity. No fibroids identified. Prior C-section scar noted. Endometrial thickness measures 13 mm.  Both ovaries are normal in appearance. No adnexal mass identified. Small amount of free fluid noted in pelvic cul-de-sac, without significant change compared to prior exam.  IMPRESSION: No IUP or adnexal mass visualized. Differential diagnosis still includes recent spontaneous abortion, IUP too early to visualize, and occult  ectopic pregnancy. Recommend close follow up of quantitative B-HCG levels, and follow up US as clinically warranted.   Electronically Signed   By: Earle Gell M.D.   On: 06/09/2014 23:18   US Ob Transvaginal  06/09/2014   CLINICAL DATA:  Abdominal pain. Positive pregnancy test, with quantitative beta HCG level of 222.  EXAM: OBSTETRIC <14 WK [place] AND TRANSVAGINAL OB US  TECHNIQUE: Both transabdominal and transvaginal ultrasound examinations were performed for complete evaluation of the gestation as well as the maternal uterus, adnexal regions, and pelvic cul-de-sac. Transvaginal technique was performed to assess early pregnancy.  COMPARISON:  06/08/2014  FINDINGS: No intrauterine gestational sac or other fluid collection visualized in endometrial cavity. No fibroids identified. Prior C-section scar noted. Endometrial thickness measures 13 mm.  Both ovaries are normal in appearance. No adnexal mass identified. Small amount of free fluid noted in pelvic cul-de-sac, without significant change compared to prior exam.  IMPRESSION: No IUP or adnexal mass visualized. Differential diagnosis still includes recent spontaneous abortion, IUP too early to visualize, and occult ectopic pregnancy. Recommend close follow up of quantitative B-HCG levels, and follow up US as clinically warranted.   Electronically Signed   By:  Earle Gell M.D.   On: 06/09/2014 23:18   US Ob Transvaginal  06/08/2014   CLINICAL DATA:  Left lower quadrant pain.  Positive pregnancy test.  EXAM: OBSTETRIC <14 WK [place] AND TRANSVAGINAL OB US  TECHNIQUE: Both transabdominal and transvaginal ultrasound examinations were performed for complete evaluation of the gestation as well as the maternal uterus, adnexal regions, and pelvic cul-de-sac. Transvaginal technique was performed to assess early pregnancy.  COMPARISON:  None.  FINDINGS: Intrauterine gestational sac: None visualized.  Yolk sac:  None visualized.  Embryo:  None visualized.  Cardiac Activity: None  visualized.  Maternal uterus/adnexae: Adnexal nodes difficult to evaluate due to overlying bowel gas. Multiple tiny cyst left ovary, most likely physiologic. Small amount of adjacent fluid is noted.  IMPRESSION: 1. No intrauterine pregnancy noted. 2. Multiple tiny cysts left ovary, most likely physiologic. Small amount of fluid is noted adjacent to the left ovary. Continued follow-up to evaluate for developing IUP and to exclude ectopic pregnancy suggested.   Electronically Signed   By: Marcello Moores  Register   On: 06/08/2014 15:18   Ct Abdomen Pelvis W Contrast  06/10/2014   CLINICAL DATA:  Left lower quadrant pain. Nausea and vomiting. Vaginal and rectal bleeding. Decreasing beta hCG levels.  EXAM: CT ABDOMEN AND PELVIS WITH CONTRAST  TECHNIQUE: Multidetector CT imaging of the abdomen and pelvis was performed using the standard protocol following bolus administration of intravenous contrast.  CONTRAST:  156m OMNIPAQUE IOHEXOL 300 MG/ML  SOLN  COMPARISON:  Ultrasound on 06/09/2014  FINDINGS: Liver:  No mass or other parenchymal abnormality identified.  Gallbladder/Biliary: Prior cholecystectomy noted. No evidence of biliary dilatation.  Pancreas: No mass, inflammatory changes, or other parenchymal abnormality identified.  Spleen:  Within normal limits in size and appearance.  Adrenal Glands:  No mass identified.  Kidneys/Urinary Tract: No masses identified. No evidence of hydronephrosis.  Lymph Nodes:  No pathologically enlarged lymph nodes identified.  Pelvic/Reproductive Organs: Uterus and right adnexa are unremarkable in appearance. A left adnexal mass is seen measuring 4.1 x 4.8 cm which has a central cystic focus. This is nonspecific in appearance, but suspicious for ectopic pregnancy in the setting of positive pregnancy test. No evidence of hemoperitoneum.  Bowel/Peritoneum: No evidence of bowel wall thickening, mass, or obstruction.  Vascular:  No evidence of abdominal aortic aneurysm.  Musculoskeletal:  No  suspicious bone lesions identified.  Other:  None.  IMPRESSION: 4.8 cm left adnexal mass, suspicious for ectopic pregnancy in the setting of positive pregnancy test. No evidence of hemoperitoneum.  Critical Value/emergent results were called by telephone at the time of interpretation on 06/10/2014 at 1:57 am [facility] , who verbally acknowledged these results.   Electronically Signed   By: JEarle GellM.D.   On: 06/10/2014 02:00    Assessment/Plan: A:  Left Adnexal Mass, probable ectopic pregnancy      Hx BTL   P:  Admit to OR       Prep for surgery       Drs LGala Romneyand PKennon Roundswill do surgery          [facility]9/02/2014, 5:45 AM   Pt see and examined. Pt seen and examined.  Pt consented for Laparoscopy, leftsalpingectomy, and removal of ectopic pregnancy. Risks include but not limited to bleeding, infection, damage to intrabdominal organs, complications from anesthesia, and DVT which could lead to PE.  Pt also consents to laparotomy if unable to complete via laparoscopy.  OR notified and  will call for patient.  Porshea Janowski H. 6:26 AM

## 2014-06-10 NOTE — ED Provider Notes (Signed)
Patient with left-sided pelvic mass on CT. Discussed with Dr. Gala Romney and she will accept patient as transfer to [facility]. Patient is hemodynamically stable. She continues to have left lower quadrant tenderness to palpation without rebound or guarding.  Julianne Rice, MD 06/10/14 [phone]

## 2014-06-10 NOTE — Anesthesia Procedure Notes (Signed)
Procedure Name: Intubation Date/Time: 06/10/2014 8:00 AM Performed by: Flossie Dibble Pre-anesthesia Checklist: Patient identified, Timeout performed, Emergency Drugs available, Suction available and Patient being monitored Patient Re-evaluated:Patient Re-evaluated prior to inductionOxygen Delivery Method: Circle system utilized Preoxygenation: Pre-oxygenation with 100% oxygen Intubation Type: IV induction Ventilation: Mask ventilation without difficulty Laryngoscope Size: Mac and 3 Grade View: Grade I Tube type: Oral Number of attempts: 1 Airway Equipment and Method: Patient positioned with wedge pillow and Stylet (9 bath blankets under upper back & shoulders + head cradle.) Placement Confirmation: ETT inserted through vocal cords under direct vision,  breath sounds checked- equal and bilateral and positive ETCO2 Secured at: 21 cm Tube secured with: Tape Dental Injury: Teeth and Oropharynx as per pre-operative assessment  Difficulty Due To: Difficulty was anticipated

## 2014-06-10 NOTE — Anesthesia Preprocedure Evaluation (Addendum)
Anesthesia Evaluation  Patient identified by MRN, date of birth, ID band Patient awake    Reviewed: Allergy & Precautions, H&P , NPO status , Patient's Chart, lab work & pertinent test results  Airway Mallampati: II TM Distance: >3 FB Neck ROM: Full    Dental no notable dental hx.    Pulmonary neg pulmonary ROS,  breath sounds clear to auscultation  Pulmonary exam normal       Cardiovascular negative cardio ROS  Rhythm:Regular Rate:Normal     Neuro/Psych  Headaches, negative neurological ROS  negative psych ROS   GI/Hepatic negative GI ROS, Neg liver ROS,   Endo/Other  Morbid obesity  Renal/GU negative Renal ROS  negative genitourinary   Musculoskeletal negative musculoskeletal ROS (+)   Abdominal   Peds negative pediatric ROS (+)  Hematology negative hematology ROS (+)   Anesthesia Other Findings   Reproductive/Obstetrics (+) Pregnancy                          Anesthesia Physical Anesthesia Plan  ASA: III and emergent  Anesthesia Plan: General   Post-op Pain Management:    Induction: Intravenous, Rapid sequence and Cricoid pressure planned  Airway Management Planned: Oral ETT  Additional Equipment:   Intra-op Plan:   Post-operative Plan: Extubation in OR  Informed Consent: I have reviewed the patients History and Physical, chart, labs and discussed the procedure including the risks, benefits and alternatives for the proposed anesthesia with the patient or authorized representative who has indicated his/her understanding and acceptance.   Dental advisory given  Plan Discussed with: CRNA and Surgeon  Anesthesia Plan Comments:       Anesthesia Quick Evaluation

## 2014-06-10 NOTE — Op Note (Signed)
Tanya [name] PROCEDURE DATE: 06/09/2014 - 06/10/2014  PREOPERATIVE DIAGNOSIS: Ruptured ectopic pregnancy POSTOPERATIVE DIAGNOSIS: Ruptured left fallopian tube ectopic pregnancy PROCEDURE: Laparoscopic left salpingectomy and removal of ectopic pregnancy SURGEON:  Dr. Silas [place]  ASSISTANT: Dr. Darron Doom ANESTHESIOLOGIST: Dr. Lyn Hollingshead  INDICATIONS: 33 y.o. [id number]  here for with ruptured ectopic pregnancy. On exam, she had stable vital signs, and an acute abdomen. Hgb 10.7, blood type A+. Patient was counseled regarding need for laparoscopic salpingectomy. Risks of surgery including bleeding which may require transfusion or reoperation, infection, injury to bowel or other surrounding organs, need for additional procedures including laparotomy and other postoperative/anesthesia complications were explained to patient.  Written informed consent was obtained.  FINDINGS:  Hhemoperitoneum estimated to be about 100 of blood and clots.  Dilated left fallopian tube containing ectopic gestation. Small normal appearing uterus, normal right ligated fallopian tube, and normal right ovary and left ovary.  ANESTHESIA: General  ESTIMATED BLOOD LOSS: 100 ml hemoperitoneum upon entry and 20 ml during the operation.  SPECIMENS: left fallopian tube containing ectopic gestation  COMPLICATIONS: None immediate  PROCEDURE IN DETAIL:  The patient was taken to the operating room where general anesthesia was administered and was found to be adequate.  She was placed in the dorsal lithotomy position, and was prepped and draped in a sterile manner.  A Foley catheter was inserted into her bladder and attached to constant drainage and a uterine manipulator was then advanced into the uterus .  After an adequate timeout was performed, attention was then turned to the patient's abdomen where a 10-mm skin incision was made on the umbilical fold.  Using the 10/11 XL Optiview trocar, the scope was introduced into the  peritoneal cavity.  A pneumoperitoneum was achieved with CO2.  . A survey of the patient's pelvis and abdomen revealed the findings as above.  Bilateral lower quadrant ports were placed under direct visualization; 12-mm on the lright and 5-mm on the left.  A second 5 mm port was placed 5 cm cephalad in order to gain better access to the pelvis.  The 10-mm Nezhat suction irrigator was then used to suction the hemoperitoneum and irrigate the pelvis.  Attention was then turned to the left fallopian tube which was grasped and ligated from the underlying mesosalpinx and uterine attachment using the Harmonic Scalpel instrument.  Good hemostasis was noted.  The specimen was placed in an EndoCatch bag and removed from the abdomen intact.  The abdomen was desufflated, and all instruments were removed.  The fascial incisions of both 12-mm site was reapproximated with 0 Vicryl figure-of-eight stitch using the laparoscopic fascial closure device.  The 88/91 XL at the umbilicus and the two 5-mm ports did not need fascial closure.  The skin of all 4 sites was closed with 4-0 Vicryl in a subcuticular fashion. The patient tolerated the procedures well.  All instruments, needles, and sponge counts were correct x 2. The patient was taken to the recovery room in stable condition.   Tanya Ragan H. 06/10/2014 9:39 AM

## 2014-06-10 NOTE — MAU Note (Signed)
Received pt by [facility] from [facility] ER.  Left sided low abd pain since Sept 1st.  Positive UPT.  Pt reports she has a mass on her tube and they think it's ectopic.

## 2014-06-10 NOTE — ED Provider Notes (Signed)
Medical screening examination/treatment/procedure(s) were performed by non-physician practitioner and as supervising physician I was immediately available for consultation/collaboration.   EKG Interpretation None       Threasa Beards, MD 06/10/14 [phone]

## 2014-06-10 NOTE — Anesthesia Postprocedure Evaluation (Signed)
Anesthesia Post Note  Patient: Tanya [name]  Procedure(s) Performed: Procedure(s) (LRB): LAPAROSCOPIC Left SALPINGECTOMY, removal of ectopic pregnancy (Left)  Anesthesia type: General  Patient location: PACU  Post pain: Pain level controlled  Post assessment: Post-op Vital signs reviewed  Last Vitals:  Filed Vitals:   06/10/14 1115  BP: 98/48  Pulse: 97  Temp:   Resp: 21    Post vital signs: Reviewed  Level of consciousness: sedated  Complications: No apparent anesthesia complications

## 2014-06-10 NOTE — Discharge Instructions (Signed)
Ruptured Ectopic Pregnancy °An ectopic pregnancy is when the fertilized egg attaches (implants) outside the uterus. Most ectopic pregnancies occur in the fallopian tube. Rarely do ectopic pregnancies occur on the ovary, intestine, pelvis, or cervix. An ectopic pregnancy does not have the ability to develop into a normal, healthy baby.  °A ruptured ectopic pregnancy is one in which the fallopian tube gets torn or bursts and results in internal bleeding. Often there is intense abdominal pain, and sometimes, vaginal bleeding. Having an ectopic pregnancy can be a life-threatening experience. If left untreated, this dangerous condition can lead to a blood transfusion, abdominal surgery, or even death.  °CAUSES  °Damage to the fallopian tubes is the suspected cause in most ectopic pregnancies.  °RISK FACTORS °Depending on your circumstances, the amount of risk of having an ectopic pregnancy will vary. There are 3 categories that may help you identify whether you are potentially at risk. °High Risk °· You have gone through infertility treatment. °· You have had a previous ectopic pregnancy. °· You have had previous tubal surgery. °· You have had previous surgery to have the fallopian tubes tied (tubal ligation). °· You have tubal problems or diseases. °· You have been exposed to DES. DES is a medicine that was used until 1971 and had effects on babies whose mothers took the medicine. °· You become pregnant while using an intrauterine device (IUD) for birth control.  °Moderate Risk °· You have a history of infertility. °· You have a history of a sexually transmitted infection (STI). °· You have a history of pelvic inflammatory disease (PID). °· You have scarring from endometriosis. °· You have multiple sexual partners. °· You smoke.  °Low Risk °· You have had previous pelvic surgery. °· You use vaginal douching. °· You became sexually active before 35 years of age. °SYMPTOMS °An ectopic pregnancy should be suspected in  anyone who has missed a period and has abdominal pain or bleeding. °· You may experience normal pregnancy symptoms, such as: °¨ Nausea. °¨ Tiredness. °¨ Breast tenderness. °· Symptoms that are not normal include: °¨ Pain with intercourse. °¨ Irregular vaginal bleeding or spotting. °¨ Cramping or pain on one side, or in the lower abdomen. °¨ Fast heartbeat. °¨ Passing out while having a bowel movement. °· Symptoms of a ruptured ectopic pregnancy and internal bleeding may include: °¨ Sudden, severe pain in the abdomen and pelvis. °¨ Dizziness or fainting. °¨ Pain in the shoulder area. °DIAGNOSIS  °Tests that may be performed include: °· A pregnancy test. °· An ultrasound. °· Testing the specific level of pregnancy hormone in the bloodstream. °· Taking a sample of uterus tissue (dilation and curettage, D&C). °· Surgery to perform a visual exam of the inside of the abdomen using a lighted tube (laparoscopy). °TREATMENT  °Laparoscopic surgery or abdominal surgery is recommended for a ruptured ectopic pregnancy.  °· The whole fallopian tube may need to be removed (salpingectomy). °· If the tube is not too damaged, the tube may be saved, and the pregnancy will be surgically removed. In time, the tube may still function. °· If you have lost a lot of blood, you may need a blood transfusion. °· You may receive a Rho (D) immune globulin shot if you are Rh negative and the father is Rh positive, or if you do not know the Rh type of the father. This is to prevent problems with any future pregnancy. °SEEK IMMEDIATE MEDICAL CARE IF:  °You have any symptoms of an ectopic or ruptured ectopic pregnancy. This   is a medical emergency. °MAKE SURE YOU: °· Understand these instructions. °· Will watch your condition. °· Will get help right away if you are not doing well or get worse. °Document Released: 09/19/2000 Document Revised: 09/27/2013 Document Reviewed: 07/04/2013 °ExitCare® Patient Information ©2015 [employer]. This information  is not intended to replace advice given to you by your health care provider. Make sure you discuss any questions you have with your health care provider. ° °

## 2014-06-13 ENCOUNTER — Encounter: Payer: Self-pay | Admitting: *Deleted

## 2014-06-13 ENCOUNTER — Encounter (HOSPITAL_COMMUNITY): Payer: Self-pay | Admitting: Obstetrics & Gynecology

## 2014-06-13 ENCOUNTER — Telehealth: Payer: Self-pay | Admitting: *Deleted

## 2014-06-13 NOTE — Telephone Encounter (Signed)
Patient left message stating that she had surgery Saturday with Dr. Gala Romney and didn't get a note for work. She works as a [employer] manager. Will route note to Dr. Gala Romney to inquire about how long patient should be out.

## 2014-06-13 NOTE — Telephone Encounter (Signed)
Spoke to patient will give letter from date of surgery and return to work 06/19/14.

## 2014-06-13 NOTE — Telephone Encounter (Signed)
Dr. Gala Romney replied to my message and stated that patient can be taken out of work for 1 week. She should return to work next Monday.

## 2014-06-14 ENCOUNTER — Other Ambulatory Visit: Payer: Self-pay | Admitting: Obstetrics & Gynecology

## 2014-06-14 DIAGNOSIS — O009 Unspecified ectopic pregnancy without intrauterine pregnancy: Secondary | ICD-10-CM

## 2014-06-14 DIAGNOSIS — K661 Hemoperitoneum: Secondary | ICD-10-CM

## 2014-06-14 DIAGNOSIS — O00102 Left tubal pregnancy without intrauterine pregnancy: Secondary | ICD-10-CM

## 2014-06-14 NOTE — Progress Notes (Signed)
Patient coming 9/11 for bhcg

## 2014-06-14 NOTE — Progress Notes (Signed)
There were no POC on pathology from left fallopian tube.  Will need to follow weekly quants to 0.

## 2014-06-14 NOTE — Progress Notes (Signed)
Called patient and informed her of results and need to come in Friday for bhcg and weekly lab draws until her levels reach 0. Patient verbalized understanding and asked if her right tube was still tied. Told patient looking through Dr Leggett's operative note she saw that her right tube was still tied and that she did not do anything to the right tube. Patient verbalized understanding and states she can come Friday 9/11 @ 11. Patient had no other questions

## 2014-06-16 ENCOUNTER — Other Ambulatory Visit: Payer: Medicaid Other

## 2014-06-16 DIAGNOSIS — O039 Complete or unspecified spontaneous abortion without complication: Secondary | ICD-10-CM

## 2014-06-16 DIAGNOSIS — O009 Unspecified ectopic pregnancy without intrauterine pregnancy: Secondary | ICD-10-CM

## 2014-06-16 LAB — HCG, QUANTITATIVE, PREGNANCY: hCG, Beta Chain, Quant, S: 6.4 m[IU]/mL

## 2014-06-19 ENCOUNTER — Telehealth: Payer: Self-pay

## 2014-06-19 NOTE — Telephone Encounter (Signed)
Called pt and left message to return call to the clinics it's concerning results and follow up appt.

## 2014-06-19 NOTE — Telephone Encounter (Signed)
Message copied by Michel Harrow on Mon Jun 19, 2014  9:21 AM ------      Message from: Guss Bunde      Created: Mon Jun 19, 2014  9:01 AM       Pt needs one more beta in 1 week.  RN to call. ------

## 2014-06-19 NOTE — Telephone Encounter (Signed)
Pt returned call for results.  Called pt and left message that she the appt line and ask to speak with Tanya [name] if she can call within the next 20 min or she can call the nurse call back line and leave in the message that we can leave info. On her voicemail.

## 2014-06-19 NOTE — Telephone Encounter (Signed)
Pt returned call and I informed her that her beta results are 6.4 but the provider would like for her to come in on Friday to follow up for another beta to make sure that her levels are <2.  Pt stated that she could come in on Thursday @ 4pm to get another beta quant draw.

## 2014-06-22 ENCOUNTER — Other Ambulatory Visit: Payer: Medicaid Other

## 2014-06-22 DIAGNOSIS — O039 Complete or unspecified spontaneous abortion without complication: Secondary | ICD-10-CM

## 2014-06-23 ENCOUNTER — Telehealth: Payer: Self-pay | Admitting: *Deleted

## 2014-06-23 LAB — HCG, QUANTITATIVE, PREGNANCY

## 2014-06-23 NOTE — Telephone Encounter (Signed)
Pt called in to nurse line requesting antibiotic, states her incision is infected.  Informed patient that the patient would need to be seen by a provider.  Pt states she is at work and has no insurance.  Encouraged patient to come to [facility] for evaluation.  Pt requested lab work results.  Results given.  Pt verbalizes understanding.

## 2014-08-07 ENCOUNTER — Encounter (HOSPITAL_COMMUNITY): Payer: Self-pay | Admitting: Obstetrics & Gynecology

## 2014-11-07 ENCOUNTER — Other Ambulatory Visit: Payer: Self-pay

## 2014-11-07 DIAGNOSIS — Z1231 Encounter for screening mammogram for malignant neoplasm of breast: Secondary | ICD-10-CM

## 2015-01-04 ENCOUNTER — Ambulatory Visit
Admission: RE | Admit: 2015-01-04 | Discharge: 2015-01-04 | Disposition: A | Discharge status: Home / Self Care | Payer: Medicaid Other | Source: Ambulatory Visit

## 2015-01-04 DIAGNOSIS — Z1231 Encounter for screening mammogram for malignant neoplasm of breast: Secondary | ICD-10-CM

## 2015-01-05 ENCOUNTER — Other Ambulatory Visit: Payer: Self-pay | Admitting: Obstetrics and Gynecology

## 2015-01-05 DIAGNOSIS — R928 Other abnormal and inconclusive findings on diagnostic imaging of breast: Secondary | ICD-10-CM

## 2015-01-10 ENCOUNTER — Encounter (INDEPENDENT_AMBULATORY_CARE_PROVIDER_SITE_OTHER): Payer: Self-pay

## 2015-01-10 ENCOUNTER — Ambulatory Visit
Admission: RE | Admit: 2015-01-10 | Discharge: 2015-01-10 | Disposition: A | Discharge status: Home / Self Care | Payer: Medicaid Other | Source: Ambulatory Visit | Attending: Obstetrics and Gynecology | Admitting: Obstetrics and Gynecology

## 2015-01-10 DIAGNOSIS — R928 Other abnormal and inconclusive findings on diagnostic imaging of breast: Secondary | ICD-10-CM

## 2015-12-03 ENCOUNTER — Other Ambulatory Visit: Payer: Self-pay

## 2015-12-03 DIAGNOSIS — Z1231 Encounter for screening mammogram for malignant neoplasm of breast: Secondary | ICD-10-CM

## 2016-01-04 ENCOUNTER — Ambulatory Visit
Admission: RE | Admit: 2016-01-04 | Discharge: 2016-01-04 | Disposition: A | Discharge status: Home / Self Care | Payer: Medicaid Other | Source: Ambulatory Visit

## 2016-01-04 DIAGNOSIS — Z1231 Encounter for screening mammogram for malignant neoplasm of breast: Secondary | ICD-10-CM

## 2016-04-09 ENCOUNTER — Emergency Department (HOSPITAL_COMMUNITY): Payer: Medicaid Other

## 2016-04-09 ENCOUNTER — Emergency Department (HOSPITAL_COMMUNITY)
Admission: EM | Admit: 2016-04-09 | Discharge: 2016-04-09 | Disposition: A | Discharge status: Home / Self Care | Payer: Medicaid Other | Attending: Emergency Medicine | Admitting: Emergency Medicine

## 2016-04-09 ENCOUNTER — Encounter (HOSPITAL_COMMUNITY): Payer: Self-pay | Admitting: *Deleted

## 2016-04-09 DIAGNOSIS — M25562 Pain in left knee: Secondary | ICD-10-CM

## 2016-04-09 DIAGNOSIS — Z79899 Other long term (current) drug therapy: Secondary | ICD-10-CM | POA: Insufficient documentation

## 2016-04-09 MED ORDER — IBUPROFEN 800 MG PO TABS
800.0000 mg | ORAL_TABLET | Freq: Once | ORAL | Status: AC
  Administered 2016-04-09: 800 mg via ORAL
  Filled 2016-04-09: qty 1

## 2016-04-09 MED ORDER — NAPROXEN 500 MG PO TABS: 500.0000 mg | ORAL_TABLET | Freq: Two times a day (BID) | ORAL | Status: DC

## 2016-04-09 MED ORDER — ACETAMINOPHEN 325 MG PO TABS
650.0000 mg | ORAL_TABLET | Freq: Once | ORAL | Status: AC
  Administered 2016-04-09: 650 mg via ORAL
  Filled 2016-04-09: qty 2

## 2016-04-09 NOTE — Discharge Instructions (Signed)

## 2016-04-09 NOTE — ED Notes (Signed)
L anterior knee pain beginning on 04/11/16 described as sharp and burning that changes to dull when sitting. Past 2 days, feels that L knee has "popped" backwards.  Using ice intermittently.  No relief from Ibuprofen 800mg .  Ice has releved swelling somewhat.  ACL repair 2009.

## 2016-04-09 NOTE — ED Provider Notes (Signed)
CSN: [id number]     Arrival date & time 04/09/16  1505 History  By signing my name below, I, Rowan Blase, attest that this documentation has been prepared under the direction and in the presence of non-physician practitioner, Gloriann Loan, PA-C. Electronically Signed: Rowan Blase, Scribe. 04/09/2016. 3:27 PM.    Chief Complaint  Patient presents with  . Knee Pain    The history is provided by the patient. No language interpreter was used.   HPI Comments:  Tanya [name] is a 35 y.o. female with PMHx of knee surgery who presents to the Emergency Department complaining of left anterior knee pain and intermittent swelling onset 1 week ago. Swelling and pain are exacerbated with exertion. Pain is stabbing and burning when standing and dull when sitting. She has tried ice, elevation and 855m Ibuprofen without relief. Pt has to walk with a limp secondary to pain. She also notes her left knee will give out intermittently. Pt notes [facility] 04[facility]05/16. Denies redness, warmth, fever, chills, or recent injury.   Past Medical History  Diagnosis Date  . Migraine   . Anxiety    Past Surgical History  Procedure Laterality Date  . Hand surgery    . Tubal ligation    . Cesarean section    . Knee surgery    . Cholecystectomy    . Wisdom tooth extraction    . Laparoscopic unilateral salpingectomy Left 06/10/2014    Procedure: LAPAROSCOPIC Left SALPINGECTOMY, removal of ectopic pregnancy;  Surgeon: KGuss Bunde MD;  [facility];  Service: Gynecology;  Laterality: Left;   Family History  Problem Relation Age of Onset  . Cancer Other   . Stroke Other   . Diabetes Other   . Coronary artery disease Other   . Breast cancer Mother 58 . Prostate cancer Father 69 . Breast cancer Maternal Aunt     dx in her 426s BRCA neg  . Prostate cancer Paternal Uncle   . Prostate cancer Paternal Grandfather   . Ovarian cancer Sister 271   found during her  pregnancy  . Prostate cancer Paternal Uncle   . Prostate cancer Paternal Uncle    Social History  Substance Use Topics  . Smoking status: Never Smoker   . Smokeless tobacco: None  . Alcohol Use: Yes     Comment: socially   OB History    Gravida Para Term Preterm AB TAB SAB Ectopic Multiple Living   '3 2 1       2     ' Review of Systems  Constitutional: Negative for fever and chills.  Musculoskeletal: Positive for joint swelling, arthralgias and gait problem.  Skin: Negative for color change.    Allergies  Imitrex; Zomig; Latex; and Sulfa antibiotics  Home Medications   Prior to Admission medications   Medication Sig Start Date End Date Taking? Authorizing Provider  ALPRAZolam (Duanne Moron 1 MG tablet Take 1 mg by mouth daily as needed for anxiety or sleep.    Historical Provider, MD  ibuprofen (ADVIL,MOTRIN) 600 MG tablet Take 1 tablet (600 mg total) by mouth every 6 (six) hours as needed. 06/10/14   KGuss Bunde MD  oxyCODONE-acetaminophen (PERCOCET/ROXICET) 5-325 MG per tablet Take 1-2 tablets by mouth every 6 (six) hours as needed. 06/10/14   KGuss Bunde MD  topiramate (TOPAMAX) 100 MG tablet Take 100-200 mg by mouth 2 (two) times daily. Takes 1057min the morning and  228m at bedtime    Historical Provider, MD   BP 141/100 mmHg  Pulse 114  Temp(Src) 98.2 F (36.8 C) (Oral)  Resp 20  Ht '5\' 1"'  (1.549 m)  Wt 122.471 kg  BMI 51.04 kg/m2  SpO2 100%  LMP 04/05/2016 (Exact Date)  Breastfeeding? No   Physical Exam  Constitutional: She is oriented to person, place, and time. She appears well-developed and well-nourished.  HENT:  Head: Normocephalic and atraumatic.  Right Ear: External ear normal.  Left Ear: External ear normal.  Eyes: Conjunctivae are normal. No scleral icterus.  Neck: No tracheal deviation present.  Cardiovascular:  Pulses:      Dorsalis pedis pulses are 2+ on the right side, and 2+ on the left side.  Pulmonary/Chest: Effort normal. No respiratory  distress.  Abdominal: She exhibits no distension.  Musculoskeletal: Normal range of motion. She exhibits tenderness.       Right knee: Normal.       Left knee: She exhibits bony tenderness. She exhibits no swelling, no deformity, no laceration and no erythema. Tenderness found.  Left knee diffusely tender.  No obvious effusion or deformity.  No warmth or erythema.  FAPROM and AROM.  Mild crepitus.  No ligamentous laxity.  Neurological: She is alert and oriented to person, place, and time.  5/5 strength bilaterally in LE.  Sensation intact to light touch.   Skin: Skin is warm and dry.  Psychiatric: She has a normal mood and affect. Her behavior is normal.    ED Course  Procedures  DIAGNOSTIC STUDIES:  Oxygen Saturation is 100% on RA, normal by my interpretation.    COORDINATION OF CARE:  3:25 PM Will x-ray left knee. Will give Ibuprofen, Tylenol and ice. Discussed treatment plan with pt at bedside and pt agreed to plan.  Labs Review Labs Reviewed - No data to display  Imaging Review Dg Knee Complete 4 Views Left  04/09/2016  CLINICAL DATA:  Anterior left knee pain for 1 week an injury. History of ACL repair in 2009. EXAM: LEFT KNEE - COMPLETE 4+ VIEW COMPARISON:  Plain films left knee 06/08/2008. MRI left knee 06/25/2008. FINDINGS: No acute bony or joint abnormality is seen. Postoperative change of ACL repair is noted. Joint spaces are preserved. No joint effusion. IMPRESSION: No acute abnormality or finding to explain the patient's symptoms. Status post ACL repair. Electronically Signed   By: TInge RiseM.D.   On: 04/09/2016 15:38   I have personally reviewed and evaluated these images and lab results as part of my medical decision-making.   EKG Interpretation None      MDM   Final diagnoses:  Left knee pain   Patient presents with atraumatic left knee pain.  No systemic symptoms.  No warmth, erythema, or swelling.  Doubt septic arthritis.  Neurovascularly intact.  Plain  films negative for acute abnormality.  Possible tendinitis or bursitis.  Will treat with Naprosyn and RICE therapy.  Follow up orthopedics.  Discussed return precautions.  Patient agrees and acknowledges the above plan for discharge.   I personally performed the services described in this documentation, which was scribed in my presence. The recorded information has been reviewed and is accurate.    KGloriann Loan PA-C 04/09/16 1556  RLeonard Schwartz MD 04/09/16 2258

## 2016-11-06 ENCOUNTER — Other Ambulatory Visit (HOSPITAL_COMMUNITY): Payer: Self-pay | Admitting: Surgery

## 2016-11-06 ENCOUNTER — Ambulatory Visit (INDEPENDENT_AMBULATORY_CARE_PROVIDER_SITE_OTHER): Payer: 59 | Admitting: Family Medicine

## 2016-11-06 VITALS — BP 132/80 | HR 100 | Temp 98.3°F | Resp 16 | Ht 61.0 in | Wt 285.6 lb

## 2016-11-06 DIAGNOSIS — G43709 Chronic migraine without aura, not intractable, without status migrainosus: Secondary | ICD-10-CM

## 2016-11-06 DIAGNOSIS — N921 Excessive and frequent menstruation with irregular cycle: Secondary | ICD-10-CM

## 2016-11-06 DIAGNOSIS — D500 Iron deficiency anemia secondary to blood loss (chronic): Secondary | ICD-10-CM | POA: Diagnosis not present

## 2016-11-06 MED ORDER — NORETHINDRONE ACET-ETHINYL EST 1.5-30 MG-MCG PO TABS: 1.0000 | ORAL_TABLET | Freq: Every day | ORAL | 11 refills | Status: DC

## 2016-11-06 NOTE — Progress Notes (Signed)
Subjective:    Patient ID: Tanya [name], female    DOB: 03-22-1981, 36 y.o.   MRN: [id number]  11/06/2016  establish care   HPI This 36 y.o. female presents to establish care.  Last physical: 12/27/15 Pap smear:  2015-12-27; [facility] Mammogram:  Every year because mother with breast cancer; due in 12/2016 Colonoscopy:none Eye exam: due Dental exam:  due  Anemia: low at last visit; has heavy menses; passes clots size of hand; long pads; goes 5-6 during the day and 2-3 at night.    History of migraines: followed by neurology; was taking Topamax but weaned off of it.  Stopping caffeine.  Has been taking Topamax for years.  Obesity: s/p consultation with bariatric surgery; Hassell Done is going to perform.  Sleeve.  Dr. Hassell Done just obtained a lot of labs.  S/p thyroid panel. S/p breath tech; checked iron.  Vitamin D and B12.    Darkening of eyes: yellow; worried about iron.   No allergies; no nasal congestion; no rhinorrhea; no sneezing.    Menorrhagia: never on time; very heavy; may have 3 days of menses and then will have severe seven day menses.  Since having BTL, menses worsened.  + intermenstrual bleeding x 2 in past year.  Does not want to take OCP.  Resistant to IUD.   Refuses Nexplanon; daughter had one.  May consider an ablation. LMP 10-20-2016 seven day menses.   Review of Systems  Constitutional: Negative for chills, diaphoresis, fatigue and fever.  HENT: Negative for congestion, dental problem, drooling, ear discharge, ear pain, facial swelling and hearing loss.   Eyes: Negative for visual disturbance.  Respiratory: Negative for cough and shortness of breath.   Cardiovascular: Negative for chest pain, palpitations and leg swelling.  Gastrointestinal: Negative for abdominal pain, constipation, diarrhea, nausea and vomiting.  Endocrine: Negative for cold intolerance, heat intolerance, polydipsia, polyphagia and polyuria.  Genitourinary: Positive for menstrual  problem.  Neurological: Positive for headaches. Negative for dizziness, tremors, seizures, syncope, facial asymmetry, speech difficulty, weakness, light-headedness and numbness.    Past Medical History:  Diagnosis Date  . Anemia   . Anxiety   . Arthritis    L knee OA  . Depression    history of depression in 12/26/10 with death of mother  . Migraine    Past Surgical History:  Procedure Laterality Date  . CESAREAN SECTION    . CHOLECYSTECTOMY    . HAND SURGERY    . KNEE SURGERY    . LAPAROSCOPIC UNILATERAL SALPINGECTOMY Left 06/10/2014   Procedure: LAPAROSCOPIC Left SALPINGECTOMY, removal of ectopic pregnancy;  Surgeon: Guss Bunde, MD;  Location: [facility];  Service: Gynecology;  Laterality: Left;  . TUBAL LIGATION    . WISDOM TOOTH EXTRACTION     Allergies  Allergen Reactions  . Imitrex [Sumatriptan] Shortness Of Breath  . Zomig [Zolmitriptan] Shortness Of Breath  . Latex Hives and Swelling  . Sulfa Antibiotics Other (See Comments)    Unknown. Since childhood.     Social History   Social History  . Marital status: Married    Spouse name: N/A  . Number of children: 2  . Years of education: N/A   Occupational History  . nurse tech [place]   Social History Main Topics  . Smoking status: Never Smoker  . Smokeless tobacco: Never Used  . Alcohol use Yes     Comment: socially  . Drug use: No  . Sexual activity: Not on  file   Other Topics Concern  . Not on file   Social History Narrative   Marital status: married x 6 years; second marriage      Children: 2 children (20, 97); 1 stepgrandaughter; 1 stepdaughter      Lives: with husband, one son      Employment: Chartered certified accountant since 2017; fifth floor surgical floor; third shift 3 twelve hour shifts      Tobacco: none      Alcohol: none      Drugs; none      Exercise: none   Family History  Problem Relation Age of Onset  . Breast cancer Mother 55  . Cancer Mother   . Prostate cancer Father 52  .  Breast cancer Maternal Aunt     dx in her 69s; BRCA neg  . Prostate cancer Paternal Uncle   . Prostate cancer Paternal Grandfather   . Ovarian cancer Sister 73    found during her pregnancy  . Prostate cancer Paternal Uncle   . Prostate cancer Paternal Uncle   . Cancer Other   . Stroke Other   . Diabetes Other   . Coronary artery disease Other        Objective:    BP 132/80   Pulse 100   Temp 98.3 F (36.8 C) (Oral)   Resp 16   Ht '5\' 1"'  (1.549 m)   Wt 285 lb 9.6 oz (129.5 kg)   LMP 10/20/2016   SpO2 100%   BMI 53.96 kg/m  Physical Exam  Constitutional: She is oriented to person, place, and time. She appears well-developed and well-nourished. No distress.  HENT:  Head: Normocephalic and atraumatic.  Right Ear: External ear normal.  Left Ear: External ear normal.  Nose: Nose normal.  Mouth/Throat: Oropharynx is clear and moist.  Eyes: Conjunctivae and EOM are normal. Pupils are equal, round, and reactive to light.  Neck: Normal range of motion. Neck supple. Carotid bruit is not present. No thyromegaly present.  Cardiovascular: Normal rate, regular rhythm, normal heart sounds and intact distal pulses.  Exam reveals no gallop and no friction rub.   No murmur heard. Pulmonary/Chest: Effort normal and breath sounds normal. She has no wheezes. She has no rales.  Abdominal: Soft. Bowel sounds are normal. She exhibits no distension and no mass. There is no tenderness. There is no rebound and no guarding.  Lymphadenopathy:    She has no cervical adenopathy.  Neurological: She is alert and oriented to person, place, and time. No cranial nerve deficit.  Skin: Skin is warm and dry. No rash noted. She is not diaphoretic. No erythema. No pallor.  Psychiatric: She has a normal mood and affect. Her behavior is normal.   Results for orders placed or performed in visit on 06/22/14  B-HCG Quant  Result Value Ref Range   hCG, Beta Chain, Quant, S <2.0 mIU/mL       Assessment & Plan:    1. Menometrorrhagia   2. Iron deficiency anemia due to chronic blood loss   3. Morbid obesity ([place])   4. Chronic migraine without aura without status migrainosus, not intractable    -new menometrorrhagia; refer to gynecology for endometrial bx, pelvic ultrasound,and consultation for ablation.  Rx for Junel while awaiting gynecology consultation to regulate menses. -obtain recent labs from Dr. Hassell Done of Bariatric Surgery. -to undergo bariatric surgery; current BMI of 53; thus, can benefit from aggressive intervention.    Orders Placed This Encounter  Procedures  .  Ambulatory referral to Gynecology    Referral Priority:   Routine    Referral Type:   Consultation    Referral Reason:   Specialty Services Required    Requested Specialty:   Gynecology    Number of Visits Requested:   1   Meds ordered this encounter  Medications  . Acetaminophen (TYLENOL 8 HOUR PO)    Sig: Take by mouth.  . Norethindrone Acetate-Ethinyl Estradiol (JUNEL,LOESTRIN,MICROGESTIN) 1.5-30 MG-MCG tablet    Sig: Take 1 tablet by mouth daily.    Dispense:  1 Package    Refill:  11    No Follow-up on file.   Tanya [name], M.D. Primary Care at [facility] [address] [place], [place]  [id number] [phone] phone [phone] fax

## 2016-11-06 NOTE — Patient Instructions (Addendum)
IF you received an x-ray today, you will receive an invoice from [facility]. Please contact [facility] at [phone] with questions or concerns regarding your invoice.   IF you received labwork today, you will receive an invoice from [facility]. Please contact [facility] at [phone] with questions or concerns regarding your invoice.   Our billing staff will not be able to assist you with questions regarding bills from these companies.  You will be contacted with the lab results as soon as they are available. The fastest way to get your results is to activate your My Chart account. Instructions are located on the last page of this paperwork. If you have not heard from [place] regarding the results in 2 weeks, please contact this office.      Dysfunctional Uterine Bleeding Introduction Dysfunctional uterine bleeding is abnormal bleeding from the uterus. Dysfunctional uterine bleeding includes:  A period that comes earlier or later than usual.  A period that is lighter, heavier, or has blood clots.  Bleeding between periods.  Skipping one or more periods.  Bleeding after sexual intercourse.  Bleeding after menopause. Follow these instructions at home: Pay attention to any changes in your symptoms. Follow these instructions to help with your condition: Eating and drinking  Eat well-balanced meals. Include foods that are high in iron, such as liver, meat, shellfish, green leafy vegetables, and eggs.  If you become constipated:  Drink plenty of water.  Eat fruits and vegetables that are high in water and fiber, such as spinach, carrots, raspberries, apples, and mango. Medicines  Take over-the-counter and prescription medicines only as told by your health care provider.  Do not change medicines without talking with your health care provider.  Aspirin or medicines that contain aspirin may make the bleeding worse. Do not take those medicines:  During the  week before your period.  During your period.  If you were prescribed iron pills, take them as told by your health care provider. Iron pills help to replace iron that your body loses because of this condition. Activity  If you need to change your sanitary pad or tampon more than one time every 2 hours:  Lie in bed with your feet raised (elevated).  Place a cold pack on your lower abdomen.  Rest as much as possible until the bleeding stops or slows down.  Do not try to lose weight until the bleeding has stopped and your blood iron level is back to normal. Other Instructions  For two months, write down:  When your period starts.  When your period ends.  When any abnormal bleeding occurs.  What problems you notice.  Keep all follow up visits as told by your health care provider. This is important. Contact a health care provider if:  You get light-headed or weak.  You have nausea and vomiting.  You cannot eat or drink without vomiting.  You feel dizzy or have diarrhea while you are taking medicines.  You are taking birth control pills or hormones, and you want to change them or stop taking them. Get help right away if:  You develop a fever or chills.  You need to change your sanitary pad or tampon more than one time per hour.  Your bleeding becomes heavier, or your flow contains clots more often.  You develop pain in your abdomen.  You lose consciousness.  You develop a rash. This information is not intended to replace advice given to you by your health care provider. Make  sure you discuss any questions you have with your health care provider. Document Released: 09/19/2000 Document Revised: 02/28/2016 Document Reviewed: 12/18/2014  2017 Elsevier

## 2016-11-18 ENCOUNTER — Encounter: Payer: Self-pay | Admitting: Family Medicine

## 2016-11-18 DIAGNOSIS — N921 Excessive and frequent menstruation with irregular cycle: Secondary | ICD-10-CM

## 2016-11-21 ENCOUNTER — Ambulatory Visit (HOSPITAL_COMMUNITY)
Admission: RE | Admit: 2016-11-21 | Discharge: 2016-11-21 | Disposition: A | Discharge status: Home / Self Care | Payer: 59 | Source: Ambulatory Visit | Attending: Surgery | Admitting: Surgery

## 2016-11-21 DIAGNOSIS — Z01818 Encounter for other preprocedural examination: Secondary | ICD-10-CM | POA: Diagnosis not present

## 2016-11-21 DIAGNOSIS — Z01810 Encounter for preprocedural cardiovascular examination: Secondary | ICD-10-CM | POA: Insufficient documentation

## 2016-11-24 NOTE — Telephone Encounter (Signed)
Referrals, please folllow up on referral to Dr. Leo Grosser of gynecology.  Pt states that Dr. Caralee Ates office cannot see the referral?

## 2016-12-01 ENCOUNTER — Encounter: Payer: 59 | Attending: Surgery | Admitting: Skilled Nursing Facility1

## 2016-12-01 ENCOUNTER — Encounter: Payer: Self-pay | Admitting: Skilled Nursing Facility1

## 2016-12-01 DIAGNOSIS — E669 Obesity, unspecified: Secondary | ICD-10-CM

## 2016-12-01 DIAGNOSIS — Z713 Dietary counseling and surveillance: Secondary | ICD-10-CM | POA: Insufficient documentation

## 2016-12-01 DIAGNOSIS — Z6841 Body Mass Index (BMI) 40.0 and over, adult: Secondary | ICD-10-CM | POA: Diagnosis not present

## 2016-12-01 NOTE — Patient Instructions (Signed)
Follow Pre-Op Goals Try Protein Shakes Call [facility] at [phone] when surgery is scheduled to enroll in Pre-Op Class  Things to remember:  Please always be honest with [place]. We want to support you!  If you have any questions or concerns in between appointments, please call or email   The diet after surgery will be high protein and low in carbohydrate.  Vitamins and calcium need to be taken for the rest of your life.  Feel free to include support people in any classes or appointments.   Supplement recommendations:  Before Surgery   1 Complete Multivitamin with Iron  3000 IU Vitamin D3  After Surgery   2 Chewable Multivitamins  **Best Choice - Bariatric Advantage Advanced Multi EA      3 Chewable Calcium (500 mg each, total 1200-1500 mg per day)  **Best Choice - Celebrate, Bariatric Advantage, or Wellesse  Other Options:    2 Flinstones Complete + up to 100 mg Thiamin + 2000-3000 IU Vitamin D3 + 350-500 mcg Vitamin B12 + 30-45 mg Iron (with history of deficiency)  2 Celebrate MultiComplete with 18 mg Iron (this provides 6000 IU of  Vitamin D3)  4 Celebrate Essential Multi 2 in 1 (has calcium) + 18-60 mg separate  iron  Vitamins and Calcium are available at:   [facility]   [address], [place], [place] [address]   [email or web address].com  www.celebratevitamins.com  www.amazon.com

## 2016-12-01 NOTE — Progress Notes (Signed)
Pre-Op Assessment Visit:  Pre-Operative Sleeve Gastrectomy Surgery  Medical Nutrition Therapy:  Appt start time: 9:20  End time:  10:03  Patient was seen on 12/01/2016 for Pre-Operative Nutrition Assessment. Assessment and letter of approval faxed to [facility] Bariatric Surgery Program coordinator on 12/01/2016.  Pt states she has worked on cutting out soda and eating out.  Start weight at [facility]: 289 pounds BMI: 53.72   24 hr Dietary Recall: First Meal: weekends: eggs and bacon and toast and hashbrowns Snack:  Second Meal: sandwich Snack:  Third Meal: skipped-----pork chops nd mac n cheese Snack:  Beverages:mountain dew, sweet tea, water  Encouraged to engage in 150 minutes of moderate physical activity including cardiovascular and weight baring weekly  Handouts given during visit include:  . Pre-Op Goals . Bariatric Surgery Protein Shakes During the appointment today the following Pre-Op Goals were reviewed with the patient: . Maintain or lose weight as instructed by your surgeon . Make healthy food choices . Begin to limit portion sizes . Limited concentrated sugars and fried foods . Keep fat/sugar in the single digits per serving on          food labels . Practice CHEWING your food  (aim for 30 chews per bite or until applesauce consistency) . Practice not drinking 15 minutes before, during, and 30 minutes after each meal/snack . Avoid all carbonated beverages  . Avoid/limit caffeinated beverages  . Avoid all sugar-sweetened beverages . Consume 3 meals per day; eat every 3-5 hours . Make a list of non-food related activities . Aim for 64-100 ounces of FLUID daily  . Aim for at least 60-80 grams of PROTEIN daily . Look for a liquid protein source that contain ?15 g protein and ?5 g carbohydrate  (ex: shakes, drinks, shots)  -Follow diet recommendations listed below   Energy and Macronutrient Recomendations: Calories: 1500 Carbohydrate: 170 Protein:  112 Fat: 42  Demonstrated degree of understanding via:  Teach Back  Teaching Method Utilized:  Visual Auditory Hands on  Barriers to learning/adherence to lifestyle change: none identified   Patient to call the [facility] to enroll in Pre-Op and Post-Op Nutrition Education when surgery date is scheduled.

## 2016-12-05 ENCOUNTER — Emergency Department (HOSPITAL_COMMUNITY)
Admission: EM | Admit: 2016-12-05 | Discharge: 2016-12-06 | Disposition: A | Discharge status: Home / Self Care | Payer: 59 | Attending: Emergency Medicine | Admitting: Emergency Medicine

## 2016-12-05 ENCOUNTER — Encounter (HOSPITAL_COMMUNITY): Payer: Self-pay | Admitting: Emergency Medicine

## 2016-12-05 DIAGNOSIS — E860 Dehydration: Secondary | ICD-10-CM | POA: Insufficient documentation

## 2016-12-05 DIAGNOSIS — Z91040 Latex allergy status: Secondary | ICD-10-CM | POA: Insufficient documentation

## 2016-12-05 DIAGNOSIS — N921 Excessive and frequent menstruation with irregular cycle: Secondary | ICD-10-CM | POA: Insufficient documentation

## 2016-12-05 DIAGNOSIS — G43809 Other migraine, not intractable, without status migrainosus: Secondary | ICD-10-CM | POA: Insufficient documentation

## 2016-12-05 DIAGNOSIS — G43709 Chronic migraine without aura, not intractable, without status migrainosus: Secondary | ICD-10-CM | POA: Insufficient documentation

## 2016-12-05 DIAGNOSIS — Z79899 Other long term (current) drug therapy: Secondary | ICD-10-CM | POA: Diagnosis not present

## 2016-12-05 DIAGNOSIS — R42 Dizziness and giddiness: Secondary | ICD-10-CM | POA: Diagnosis not present

## 2016-12-05 LAB — CBC
HCT: 39.1 % (ref 36.0–46.0)
Hemoglobin: 12.3 g/dL (ref 12.0–15.0)
MCH: 22.4 pg — ABNORMAL LOW (ref 26.0–34.0)
MCHC: 31.5 g/dL (ref 30.0–36.0)
MCV: 71.2 fL — AB (ref 78.0–100.0)
PLATELETS: 538 10*3/uL — AB (ref 150–400)
RBC: 5.49 MIL/uL — ABNORMAL HIGH (ref 3.87–5.11)
RDW: 15.2 % (ref 11.5–15.5)
WBC: 10.4 10*3/uL (ref 4.0–10.5)

## 2016-12-05 LAB — URINALYSIS, ROUTINE W REFLEX MICROSCOPIC
BACTERIA UA: NONE SEEN
Bilirubin Urine: NEGATIVE
Glucose, UA: NEGATIVE mg/dL
Ketones, ur: NEGATIVE mg/dL
LEUKOCYTES UA: NEGATIVE
Nitrite: NEGATIVE
PH: 6 (ref 5.0–8.0)
Protein, ur: NEGATIVE mg/dL
SPECIFIC GRAVITY, URINE: 1.014 (ref 1.005–1.030)

## 2016-12-05 LAB — BASIC METABOLIC PANEL
Anion gap: 9 (ref 5–15)
BUN: 15 mg/dL (ref 6–20)
CHLORIDE: 103 mmol/L (ref 101–111)
CO2: 21 mmol/L — ABNORMAL LOW (ref 22–32)
CREATININE: 0.77 mg/dL (ref 0.44–1.00)
Calcium: 9.1 mg/dL (ref 8.9–10.3)
GFR calc Af Amer: >60 mL/min (ref >60)
GFR calc non Af Amer: >60 mL/min (ref >60)
GLUCOSE: 105 mg/dL — AB (ref 65–99)
Potassium: 3.9 mmol/L (ref 3.5–5.1)
SODIUM: 133 mmol/L — AB (ref 135–145)

## 2016-12-05 MED ORDER — SODIUM CHLORIDE 0.9 % IV BOLUS (SEPSIS)
1000.0000 mL | Freq: Once | INTRAVENOUS | Status: AC
  Administered 2016-12-05: 1000 mL via INTRAVENOUS

## 2016-12-05 NOTE — ED Triage Notes (Addendum)
Pt c/o dizziness that began this evening after getting out of the shower; pt states she gets migraines and has been feeling foggy in her head; describes it as feeling "like she's in outer space"; pt also states that she's "seeing spots"; denies confusion, numbness, and abnormal gait; hx of anemia

## 2016-12-05 NOTE — ED Provider Notes (Signed)
[facility] DEPT Provider Note   CSN: [id number] Arrival date & time: 12/05/16  1900 By signing my name below, I, Dyke Brackett, attest that this documentation has been prepared under the direction and in the presence of non-physician practitioner, Nehemiah Settle A Katlin Ciszewski PA-C. Electronically Signed: Dyke Brackett, Scribe. 12/05/2016. 11:17 PM.   History   Chief Complaint Chief Complaint  Patient presents with  . Dizziness   HPI Tanya [name] is a 36 y.o. female with a history of anemia, anxiety and chronic migraine who presents to the Emergency Department complaining of sudden onset, persistent dizziness which began this evening while getting out of the shower. Pt states she was in her normal health today, but suddenly felt dizzy and as if she was going to faint while exiting the shower. Per pt, dizziness is exacerbated by sitting or standing up; no alleviating factors noted. She notes associated gradual onset headache, intermittent nausea, heart palpitations, visual disturbances, and dry mouth. Pt states "I'm seeing spots". Pt also reports urinary frequency which she states is baseline for her. Per pt, she had a headache a few days ago with associated general malaise. No recent dietary changes. No hx of DM. She reports starting on birth control last month for heavy periods, but has discontinued taking it this month. Pt denies any fever, vomiting, cough, dysuria, hematuria, sinus pressure, or congestion. Pt has no other complaints or symptoms at this time.   The history is provided by the patient. No language interpreter was used.   Past Medical History:  Diagnosis Date  . Anemia   . Anxiety   . Arthritis    L knee OA  . Depression    history of depression in 16-Dec-2010 with death of mother  . Migraine     Patient Active Problem List   Diagnosis Date Noted  . Menometrorrhagia 12/05/2016  . Morbid obesity ([place]) 12/05/2016  . Chronic migraine without aura without status migrainosus, not  intractable 12/05/2016  . Ruptured left tubal ectopic pregnancy causing hemoperitoneum 06/10/2014    Past Surgical History:  Procedure Laterality Date  . CESAREAN SECTION    . CHOLECYSTECTOMY    . HAND SURGERY    . KNEE SURGERY    . LAPAROSCOPIC UNILATERAL SALPINGECTOMY Left 06/10/2014   Procedure: LAPAROSCOPIC Left SALPINGECTOMY, removal of ectopic pregnancy;  Surgeon: Guss Bunde, MD;  Location: [facility];  Service: Gynecology;  Laterality: Left;  . TUBAL LIGATION    . WISDOM TOOTH EXTRACTION      OB History    Gravida Para Term Preterm AB Living   '3 2 1     2   ' SAB TAB Ectopic Multiple Live Births           2       Home Medications    Prior to Admission medications   Medication Sig Start Date End Date Taking? Authorizing Provider  Acetaminophen (TYLENOL 8 HOUR PO) Take by mouth.    Historical Provider, MD  Norethindrone Acetate-Ethinyl Estradiol (JUNEL,LOESTRIN,MICROGESTIN) 1.5-30 MG-MCG tablet Take 1 tablet by mouth daily. 11/06/16   Wardell Honour, MD    Family History Family History  Problem Relation Age of Onset  . Breast cancer Mother 80  . Cancer Mother   . Prostate cancer Father 36  . Breast cancer Maternal Aunt     dx in her 53s; BRCA neg  . Prostate cancer Paternal Uncle   . Prostate cancer Paternal Grandfather   . Ovarian cancer Sister 34    found  during her pregnancy  . Prostate cancer Paternal Uncle   . Prostate cancer Paternal Uncle   . Cancer Other   . Stroke Other   . Diabetes Other   . Coronary artery disease Other     Social History Social History  Substance Use Topics  . Smoking status: Never Smoker  . Smokeless tobacco: Never Used  . Alcohol use Yes     Comment: socially     Allergies   Imitrex [sumatriptan]; Zomig [zolmitriptan]; Latex; and Sulfa antibiotics   Review of Systems Review of Systems  Constitutional: Negative for fever.  HENT: Negative for congestion and sinus pressure.   Eyes: Positive for visual disturbance.    Respiratory: Negative for cough.   Cardiovascular: Positive for palpitations.  Gastrointestinal: Positive for nausea. Negative for vomiting.  Genitourinary: Positive for frequency. Negative for dysuria and hematuria.  Allergic/Immunologic: Negative for immunocompromised state.  Neurological: Positive for dizziness and headaches.  Psychiatric/Behavioral: Negative for confusion.   Physical Exam Updated Vital Signs BP 121/93 (BP Location: Left Arm)   Pulse 96   Temp 97.7 F (36.5 C) (Oral)   Resp 18   Ht '5\' 1"'  (1.549 m)   Wt 280 lb (127 kg)   LMP 12/04/2016 (Approximate)   SpO2 100%   BMI 52.91 kg/m   Physical Exam  Constitutional: She is oriented to person, place, and time. She appears well-developed and well-nourished. No distress.  HENT:  Head: Normocephalic and atraumatic.  Eyes: Conjunctivae are normal.  Neck: Carotid bruit is not present.  Cardiovascular: Normal rate and regular rhythm.   No murmur heard. Pulmonary/Chest: Effort normal.  Full breath sounds all fields  Abdominal: She exhibits no distension.  Obese, soft, nontender  Neurological: She is alert and oriented to person, place, and time.  CN intact. Moves all extremities. Is alert and oriented.   Skin: Skin is warm and dry.  Psychiatric: She has a normal mood and affect.  Nursing note and vitals reviewed.  ED Treatments / Results  DIAGNOSTIC STUDIES:  Oxygen Saturation is 100% on RA, normal by my interpretation.    COORDINATION OF CARE:  11:06 PM Discussed treatment plan with pt at bedside and pt agreed to plan.   Labs (all labs ordered are listed, but only abnormal results are displayed) Labs Reviewed  BASIC METABOLIC PANEL - Abnormal; Notable for the following:       Result Value   Sodium 133 (*)    CO2 21 (*)    Glucose, Bld 105 (*)    All other components within normal limits  CBC - Abnormal; Notable for the following:    RBC 5.49 (*)    MCV 71.2 (*)    MCH 22.4 (*)    Platelets 538 (*)     All other components within normal limits  URINALYSIS, ROUTINE W REFLEX MICROSCOPIC - Abnormal; Notable for the following:    Hgb urine dipstick LARGE (*)    Squamous Epithelial / LPF 0-5 (*)    All other components within normal limits  CBG MONITORING, ED    EKG  EKG Interpretation  Date/Time:  Friday December 05 2016 19:23:50 EST Ventricular Rate:  99 PR Interval:    QRS Duration: 67 QT Interval:  317 QTC Calculation: 407 R Axis:   49 Text Interpretation:  Sinus rhythm No significant change since last tracing Confirmed by Winfred Leeds  MD, SAM ([id number]) on 12/05/2016 7:27:08 PM       Radiology No results found.  Procedures Procedures (including critical  care time)  Medications Ordered in ED Medications - No data to display   Initial Impression / Assessment and Plan / ED Course  I have reviewed the triage vital signs and the nursing notes.  Pertinent labs & imaging results that were available during my care of the patient were reviewed by me and considered in my medical decision making (see chart for details).     Patient presents for evaluation of dizziness that started tonight while in the shower. No fever, recent illness. Gradual onset headache since it began.   She is orthostatic on testing here, symptomatic dizziness. IVF's began. Headache cocktail provided.   The patient is feeling better, symptoms resolving. VSS. Labs unremarkable. She is felt stable for discharge home.    Final Clinical Impressions(s) / ED Diagnoses   Final diagnoses:  None   1. Dehydration 2. Migraine  New Prescriptions New Prescriptions   No medications on file  I personally performed the services described in this documentation, which was scribed in my presence. The recorded information has been reviewed and is accurate.      Charlann Lange, PA-C 12/15/16 0025    Orlie Dakin, MD 12/16/16 0700

## 2016-12-05 NOTE — ED Notes (Signed)
Pt taken to the back for urine specimen collection.

## 2016-12-05 NOTE — ED Notes (Signed)
Pt reports having a h/a Saturday which resolved.  Today, she started to have a h/a with "flashing spots" around 1600 when she was getting ready for work.  Pt reports she used to take topamax for her h/a but stopped taking it.  Started taking it again x 5 days ago d/t recurring h/a.  Pt is A&O x 4.  No obvious neuro deficits noted.  Reports light sensitivity.  Also reports intermittent nausea but vomiting.

## 2016-12-06 DIAGNOSIS — Z79899 Other long term (current) drug therapy: Secondary | ICD-10-CM | POA: Diagnosis not present

## 2016-12-06 DIAGNOSIS — E860 Dehydration: Secondary | ICD-10-CM | POA: Diagnosis not present

## 2016-12-06 DIAGNOSIS — Z91040 Latex allergy status: Secondary | ICD-10-CM | POA: Diagnosis not present

## 2016-12-06 DIAGNOSIS — G43809 Other migraine, not intractable, without status migrainosus: Secondary | ICD-10-CM | POA: Diagnosis not present

## 2016-12-06 MED ORDER — DIPHENHYDRAMINE HCL 50 MG/ML IJ SOLN
12.5000 mg | Freq: Once | INTRAMUSCULAR | Status: AC
  Administered 2016-12-06: 12.5 mg via INTRAVENOUS
  Filled 2016-12-06: qty 1

## 2016-12-06 MED ORDER — METOCLOPRAMIDE HCL 5 MG/ML IJ SOLN
10.0000 mg | Freq: Once | INTRAMUSCULAR | Status: AC
  Administered 2016-12-06: 10 mg via INTRAVENOUS
  Filled 2016-12-06: qty 2

## 2016-12-06 MED ORDER — ACETAMINOPHEN 500 MG PO TABS: 1000.0000 mg | ORAL_TABLET | Freq: Once | ORAL | Status: DC

## 2016-12-06 MED ORDER — SODIUM CHLORIDE 0.9 % IV BOLUS (SEPSIS)
1000.0000 mL | Freq: Once | INTRAVENOUS | Status: AC
  Administered 2016-12-06: 1000 mL via INTRAVENOUS

## 2016-12-06 MED ORDER — DEXAMETHASONE SODIUM PHOSPHATE 10 MG/ML IJ SOLN
10.0000 mg | Freq: Once | INTRAMUSCULAR | Status: AC
  Administered 2016-12-06: 10 mg via INTRAVENOUS
  Filled 2016-12-06: qty 1

## 2016-12-08 ENCOUNTER — Encounter (HOSPITAL_COMMUNITY): Payer: Self-pay | Admitting: Emergency Medicine

## 2016-12-08 ENCOUNTER — Emergency Department (HOSPITAL_COMMUNITY): Payer: 59

## 2016-12-08 ENCOUNTER — Inpatient Hospital Stay (HOSPITAL_COMMUNITY)
Admission: EM | Admit: 2016-12-08 | Discharge: 2016-12-12 | DRG: 309 | Disposition: A | Discharge status: Home / Self Care | Payer: 59 | Attending: Internal Medicine | Admitting: Internal Medicine

## 2016-12-08 DIAGNOSIS — R531 Weakness: Secondary | ICD-10-CM | POA: Diagnosis not present

## 2016-12-08 DIAGNOSIS — R42 Dizziness and giddiness: Secondary | ICD-10-CM | POA: Diagnosis not present

## 2016-12-08 DIAGNOSIS — D500 Iron deficiency anemia secondary to blood loss (chronic): Secondary | ICD-10-CM | POA: Diagnosis present

## 2016-12-08 DIAGNOSIS — R200 Anesthesia of skin: Secondary | ICD-10-CM | POA: Diagnosis not present

## 2016-12-08 DIAGNOSIS — Z91040 Latex allergy status: Secondary | ICD-10-CM

## 2016-12-08 DIAGNOSIS — Z6841 Body Mass Index (BMI) 40.0 and over, adult: Secondary | ICD-10-CM | POA: Diagnosis not present

## 2016-12-08 DIAGNOSIS — R0789 Other chest pain: Secondary | ICD-10-CM | POA: Diagnosis not present

## 2016-12-08 DIAGNOSIS — R404 Transient alteration of awareness: Secondary | ICD-10-CM | POA: Diagnosis not present

## 2016-12-08 DIAGNOSIS — R0682 Tachypnea, not elsewhere classified: Secondary | ICD-10-CM | POA: Diagnosis present

## 2016-12-08 DIAGNOSIS — G4489 Other headache syndrome: Secondary | ICD-10-CM | POA: Diagnosis not present

## 2016-12-08 DIAGNOSIS — R002 Palpitations: Secondary | ICD-10-CM

## 2016-12-08 DIAGNOSIS — F419 Anxiety disorder, unspecified: Secondary | ICD-10-CM | POA: Diagnosis present

## 2016-12-08 DIAGNOSIS — R000 Tachycardia, unspecified: Secondary | ICD-10-CM

## 2016-12-08 DIAGNOSIS — D473 Essential (hemorrhagic) thrombocythemia: Secondary | ICD-10-CM | POA: Diagnosis present

## 2016-12-08 DIAGNOSIS — E860 Dehydration: Secondary | ICD-10-CM | POA: Diagnosis not present

## 2016-12-08 DIAGNOSIS — D519 Vitamin B12 deficiency anemia, unspecified: Secondary | ICD-10-CM | POA: Diagnosis not present

## 2016-12-08 DIAGNOSIS — I951 Orthostatic hypotension: Secondary | ICD-10-CM | POA: Diagnosis present

## 2016-12-08 DIAGNOSIS — F329 Major depressive disorder, single episode, unspecified: Secondary | ICD-10-CM | POA: Diagnosis present

## 2016-12-08 DIAGNOSIS — T384X5A Adverse effect of oral contraceptives, initial encounter: Secondary | ICD-10-CM | POA: Diagnosis present

## 2016-12-08 DIAGNOSIS — N920 Excessive and frequent menstruation with regular cycle: Secondary | ICD-10-CM | POA: Diagnosis present

## 2016-12-08 DIAGNOSIS — M1712 Unilateral primary osteoarthritis, left knee: Secondary | ICD-10-CM | POA: Diagnosis present

## 2016-12-08 DIAGNOSIS — G43909 Migraine, unspecified, not intractable, without status migrainosus: Secondary | ICD-10-CM | POA: Diagnosis present

## 2016-12-08 DIAGNOSIS — R0602 Shortness of breath: Secondary | ICD-10-CM | POA: Diagnosis not present

## 2016-12-08 DIAGNOSIS — I5032 Chronic diastolic (congestive) heart failure: Secondary | ICD-10-CM | POA: Diagnosis present

## 2016-12-08 DIAGNOSIS — D75839 Thrombocytosis, unspecified: Secondary | ICD-10-CM

## 2016-12-08 DIAGNOSIS — Z9049 Acquired absence of other specified parts of digestive tract: Secondary | ICD-10-CM

## 2016-12-08 DIAGNOSIS — R51 Headache: Secondary | ICD-10-CM | POA: Diagnosis not present

## 2016-12-08 DIAGNOSIS — I471 Supraventricular tachycardia: Secondary | ICD-10-CM | POA: Diagnosis not present

## 2016-12-08 DIAGNOSIS — Z881 Allergy status to other antibiotic agents status: Secondary | ICD-10-CM

## 2016-12-08 DIAGNOSIS — M5126 Other intervertebral disc displacement, lumbar region: Secondary | ICD-10-CM | POA: Diagnosis not present

## 2016-12-08 DIAGNOSIS — D72829 Elevated white blood cell count, unspecified: Secondary | ICD-10-CM | POA: Diagnosis not present

## 2016-12-08 DIAGNOSIS — Z79899 Other long term (current) drug therapy: Secondary | ICD-10-CM

## 2016-12-08 DIAGNOSIS — R9431 Abnormal electrocardiogram [ECG] [EKG]: Secondary | ICD-10-CM | POA: Diagnosis not present

## 2016-12-08 DIAGNOSIS — Z90721 Acquired absence of ovaries, unilateral: Secondary | ICD-10-CM

## 2016-12-08 DIAGNOSIS — Z888 Allergy status to other drugs, medicaments and biological substances status: Secondary | ICD-10-CM

## 2016-12-08 DIAGNOSIS — D508 Other iron deficiency anemias: Secondary | ICD-10-CM | POA: Diagnosis not present

## 2016-12-08 DIAGNOSIS — R0600 Dyspnea, unspecified: Secondary | ICD-10-CM | POA: Diagnosis not present

## 2016-12-08 DIAGNOSIS — G43809 Other migraine, not intractable, without status migrainosus: Secondary | ICD-10-CM | POA: Diagnosis present

## 2016-12-08 LAB — URINALYSIS, ROUTINE W REFLEX MICROSCOPIC
Bilirubin Urine: NEGATIVE
GLUCOSE, UA: NEGATIVE mg/dL
KETONES UR: NEGATIVE mg/dL
Leukocytes, UA: NEGATIVE
NITRITE: NEGATIVE
PROTEIN: NEGATIVE mg/dL
Specific Gravity, Urine: 1.019 (ref 1.005–1.030)
pH: 5 (ref 5.0–8.0)

## 2016-12-08 LAB — BASIC METABOLIC PANEL
ANION GAP: 7 (ref 5–15)
BUN: 19 mg/dL (ref 6–20)
CALCIUM: 8.9 mg/dL (ref 8.9–10.3)
CO2: 22 mmol/L (ref 22–32)
Chloride: 110 mmol/L (ref 101–111)
Creatinine, Ser: 0.79 mg/dL (ref 0.44–1.00)
GFR calc non Af Amer: >60 mL/min (ref >60)
Glucose, Bld: 98 mg/dL (ref 65–99)
POTASSIUM: 3.7 mmol/L (ref 3.5–5.1)
Sodium: 139 mmol/L (ref 135–145)

## 2016-12-08 LAB — CBC
HEMATOCRIT: 37.8 % (ref 36.0–46.0)
HEMOGLOBIN: 11.8 g/dL — AB (ref 12.0–15.0)
MCH: 21.9 pg — AB (ref 26.0–34.0)
MCHC: 31.2 g/dL (ref 30.0–36.0)
MCV: 70.3 fL — ABNORMAL LOW (ref 78.0–100.0)
Platelets: 597 10*3/uL — ABNORMAL HIGH (ref 150–400)
RBC: 5.38 MIL/uL — AB (ref 3.87–5.11)
RDW: 15.5 % (ref 11.5–15.5)
WBC: 15.3 10*3/uL — ABNORMAL HIGH (ref 4.0–10.5)

## 2016-12-08 LAB — CBG MONITORING, ED
Glucose-Capillary: 79 mg/dL (ref 65–99)
Glucose-Capillary: 80 mg/dL (ref 65–99)

## 2016-12-08 LAB — CK: Total CK: 41 U/L (ref 38–234)

## 2016-12-08 MED ORDER — LORAZEPAM 2 MG/ML IJ SOLN
1.0000 mg | Freq: Once | INTRAMUSCULAR | Status: AC
  Administered 2016-12-08: 1 mg via INTRAVENOUS
  Filled 2016-12-08: qty 1

## 2016-12-08 MED ORDER — LORAZEPAM 2 MG/ML IJ SOLN
0.5000 mg | Freq: Once | INTRAMUSCULAR | Status: AC
  Administered 2016-12-08: 0.5 mg via INTRAVENOUS
  Filled 2016-12-08: qty 1

## 2016-12-08 MED ORDER — MECLIZINE HCL 25 MG PO TABS
25.0000 mg | ORAL_TABLET | Freq: Once | ORAL | Status: AC
  Administered 2016-12-08: 25 mg via ORAL
  Filled 2016-12-08: qty 1

## 2016-12-08 MED ORDER — SODIUM CHLORIDE 0.9 % IV SOLN
INTRAVENOUS | Status: DC
  Administered 2016-12-08 – 2016-12-11 (×7): via INTRAVENOUS

## 2016-12-08 NOTE — ED Triage Notes (Signed)
Per EMS, patient from home c/o dizziness, fatigue, headache, and bilateral leg pain since Friday. Seen for same on Friday and treated for dehydration. Denies chest pain, abdominal pain, N/V/D, and SOB. Ambulatory with EMS.

## 2016-12-08 NOTE — ED Notes (Signed)
Patient transported to MRI 

## 2016-12-08 NOTE — H&P (Addendum)
History and Physical    Tanya [name] MRN:[id number] DOB: 11/11/1980 DOA: 12/08/2016  Referring MD/NP/PA:   PCP: SMITH,KRISTI, MD   Patient coming from:  The patient is coming from home.  At baseline, pt is independent for most of ADL.   Chief Complaint: Dizziness, numbness in both feet, bilateral leg pain  HPI: Tanya [name] is a 36 y.o. female with medical history significant of migraine headaches, depression, anxiety, anemia, arthritis, morbid obesity, who presents with dizziness palpitation, numbness in both feet and bilateral leg pain.  Pt states that her dizziness started suddenly after taking shower 4 days ago. Per pt, dizziness is exacerbated by position change of both her body and head. She sees spots in both eyes, but no vision loss. She has mild headache, but no neck rigidity. No ear ringing or hearing loss. She states that started having numbness in both feet and bilateral whole leg pain and mild leg weakness since yesterday. No slurred speech or facial droop. She stated that she had back injury 6 month ago and has intermittent lower back pain.   She also reports palpitation, but denies chest pain, SOB, fever, chills, cough. She has nausea, no vomiting, diarrhea or abdominal pain. Patient states that she took Norethindrone Acetate-Ethinyl Estradiol pill recently due to heavy menstrual period, but stopped taking it at the end of February. Pt denies symptoms of UTI. No recent viral infection. She states that she dose not have hx of hypertension, but had one episode of elevated blood pressure up to 200/105 yesterday. Patient was seen in ED on Friday, and treated for dehydration.   ED Course: pt was found to have  WBC 15.3, CK 41, electrolytes renal function okay, temperature normal, tachycardia, O2 saturation 100% on room air, no tachypnea, blood pressure 148/81. Pt is placed on med-surg bed for obs.  # MRI of brain showed:1. No acute intracranial abnormality. 2. Two punctate T2  FLAIR hyperintense foci in white matter of unlikely clinical significance, possibly migraine headache associated. 3. Mild maxillary sinus mucosal thickening.  Review of Systems:   General: no fevers, chills, no changes in body weight, has fatigue HEENT: No hearing changes or sore throat Respiratory: no dyspnea, coughing, wheezing CV: no chest pain, has palpitations GI: has nausea, no vomiting, abdominal pain, diarrhea, constipation GU: no dysuria, burning on urination, increased urinary frequency, hematuria  Ext: no leg edema Neuro: no unilateral weakness, or hearing loss. Has dizziness, seeing spots in both eyes.  Skin: no rash, no skin tear. MSK: No muscle spasm, no deformity, no limitation of range of movement in spin. Has bilateral leg pain. Heme: No easy bruising.  Travel history: No recent long distant travel.  Allergy:  Allergies  Allergen Reactions  . Imitrex [Sumatriptan] Shortness Of Breath  . Zomig [Zolmitriptan] Shortness Of Breath  . Latex Hives and Swelling  . Sulfa Antibiotics Other (See Comments)    Unknown. Since childhood.     Past Medical History:  Diagnosis Date  . Anemia   . Anxiety   . Arthritis    L knee OA  . Depression    history of depression in 2012 with death of mother  . Migraine     Past Surgical History:  Procedure Laterality Date  . CESAREAN SECTION    . CHOLECYSTECTOMY    . HAND SURGERY    . KNEE SURGERY    . LAPAROSCOPIC UNILATERAL SALPINGECTOMY Left 06/10/2014   Procedure: LAPAROSCOPIC Left SALPINGECTOMY, removal of ectopic pregnancy;  Surgeon: Kelly H   Leggett, MD;  Location: [facility];  Service: Gynecology;  Laterality: Left;  . TUBAL LIGATION    . WISDOM TOOTH EXTRACTION      Social History:  reports that she has never smoked. She has never used smokeless tobacco. She reports that she drinks alcohol. She reports that she does not use drugs.  Family History:  Family History  Problem Relation Age of Onset  . Breast cancer Mother 57   . Cancer Mother   . Prostate cancer Father 63  . Breast cancer Maternal Aunt     dx in her 40s; BRCA neg  . Prostate cancer Paternal Uncle   . Prostate cancer Paternal Grandfather   . Ovarian cancer Sister 27    found during her pregnancy  . Prostate cancer Paternal Uncle   . Prostate cancer Paternal Uncle   . Cancer Other   . Stroke Other   . Diabetes Other   . Coronary artery disease Other      Prior to Admission medications   Medication Sig Start Date End Date Taking? Authorizing Provider  acetaminophen (TYLENOL) 500 MG tablet Take 1,000 mg by mouth every 6 (six) hours as needed.   Yes Historical Provider, MD  flintstones complete (FLINTSTONES) 60 MG chewable tablet Chew 2 tablets by mouth daily.   Yes Historical Provider, MD  topiramate (TOPAMAX) 100 MG tablet Take 200 mg by mouth 2 (two) times daily. 08/20/15  Yes Historical Provider, MD  Norethindrone Acetate-Ethinyl Estradiol (JUNEL,LOESTRIN,MICROGESTIN) 1.5-30 MG-MCG tablet Take 1 tablet by mouth daily. Patient not taking: Reported on 12/05/2016 11/06/16   Kristi M Smith, MD    Physical Exam: Vitals:   12/08/16 1818 12/08/16 2133 12/08/16 2145 12/09/16 0023  BP: 171/97 142/63 148/81 113/70  Pulse: 103 105 96 86  Resp: 18 18  16  Temp:  97.8 F (36.6 C)  97.7 F (36.5 C)  TempSrc:    Oral  SpO2: 100% 100% 100% 100%  Weight:      Height:       General: Not in acute distress HEENT:       Eyes: PERRL, EOMI, no scleral icterus. has horizontal nystagmus       ENT: No discharge from the ears and nose, no pharynx injection, no tonsillar enlargement.        Neck: No JVD, no bruit, no mass felt. Heme: No neck lymph node enlargement. Cardiac: S1/S2, RRR, No murmurs, No gallops or rubs. Respiratory: No rales, wheezing, rhonchi or rubs. GI: Soft, nondistended, nontender, no rebound pain, no organomegaly, BS present. GU: No hematuria Ext: No pitting leg edema bilaterally. 2+DP/PT pulse bilaterally. Musculoskeletal: No joint  deformities, No joint redness or warmth, no limitation of ROM in spin. Skin: No rashes.  Neuro: Alert, oriented X3, cranial nerves II-XII grossly intact, moves all extremities normally. Muscle strength 5/5 in all extremities, sensation to light touch intact. Knee reflex 2+ bilaterally. Negative Babinski's sign. Normal finger to nose test. Neck is supple. Psych: Patient is not psychotic, no suicidal or hemocidal ideation.  Labs on Admission: I have personally reviewed following labs and imaging studies  CBC:  Recent Labs Lab 12/05/16 1945 12/08/16 1555  WBC 10.4 15.3*  HGB 12.3 11.8*  HCT 39.1 37.8  MCV 71.2* 70.3*  PLT 538* 597*   Basic Metabolic Panel:  Recent Labs Lab 12/05/16 1945 12/08/16 1555  NA 133* 139  K 3.9 3.7  CL 103 110  CO2 21* 22  GLUCOSE 105* 98  BUN 15 19  CREATININE   0.77 0.79  CALCIUM 9.1 8.9   GFR: Estimated Creatinine Clearance: 119.7 mL/min (by C-G formula based on SCr of 0.79 mg/dL). Liver Function Tests: No results for input(s): AST, ALT, ALKPHOS, BILITOT, PROT, ALBUMIN in the last 168 hours. No results for input(s): LIPASE, AMYLASE in the last 168 hours. No results for input(s): AMMONIA in the last 168 hours. Coagulation Profile: No results for input(s): INR, PROTIME in the last 168 hours. Cardiac Enzymes:  Recent Labs Lab 12/08/16 1555  CKTOTAL 41   BNP (last 3 results) No results for input(s): PROBNP in the last 8760 hours. HbA1C: No results for input(s): HGBA1C in the last 72 hours. CBG:  Recent Labs Lab 12/08/16 1540 12/08/16 1859  GLUCAP 79 80   Lipid Profile: No results for input(s): CHOL, HDL, LDLCALC, TRIG, CHOLHDL, LDLDIRECT in the last 72 hours. Thyroid Function Tests: No results for input(s): TSH, T4TOTAL, FREET4, T3FREE, THYROIDAB in the last 72 hours. Anemia Panel: No results for input(s): VITAMINB12, FOLATE, FERRITIN, TIBC, IRON, RETICCTPCT in the last 72 hours. Urine analysis:    Component Value Date/Time    COLORURINE YELLOW 12/08/2016 1538   APPEARANCEUR HAZY (A) 12/08/2016 1538   LABSPEC 1.019 12/08/2016 1538   PHURINE 5.0 12/08/2016 1538   GLUCOSEU NEGATIVE 12/08/2016 1538   HGBUR MODERATE (A) 12/08/2016 1538   BILIRUBINUR NEGATIVE 12/08/2016 1538   KETONESUR NEGATIVE 12/08/2016 1538   PROTEINUR NEGATIVE 12/08/2016 1538   UROBILINOGEN 1.0 06/09/2014 1714   NITRITE NEGATIVE 12/08/2016 1538   LEUKOCYTESUR NEGATIVE 12/08/2016 1538   Sepsis Labs: _0 (procalcitonin:4,lacticidven:4) )No results found for this or any previous visit (from the past 240 hour(s)).   Radiological Exams on Admission: Mr Brain Wo Contrast  Result Date: 12/08/2016 CLINICAL DATA:  36 y/o  F; recurrent headaches. EXAM: MRI HEAD WITHOUT CONTRAST TECHNIQUE: Multiplanar, multiecho pulse sequences of the brain and surrounding structures were obtained without intravenous contrast. COMPARISON:  06/04/2014 CT head.  10/05/2007 MRI of the brain. FINDINGS: Brain: No acute infarction, hemorrhage, hydrocephalus, extra-axial collection or mass lesion. Punctate foci of T2 FLAIR hyperintensity in right frontal subcortical white matter and right posterior temporal subcortical white matter. Vascular: Normal flow voids. Skull and upper cervical spine: Normal marrow signal. Sinuses/Orbits: Mild maxillary sinus mucosal thickening. No abnormal signal of mastoid air cells. Orbits are unremarkable. Other: None. IMPRESSION: 1. No acute intracranial abnormality. 2. Two punctate T2 FLAIR hyperintense foci in white matter of unlikely clinical significance, possibly migraine headache associated. 3. Mild maxillary sinus mucosal thickening. Electronically Signed   By: Kristine Garbe M.D.   On: 12/08/2016 20:53     EKG: Independently reviewed.  Sinus rhythm, QTC 407, no ischemic change.   Assessment/Plan Principal Problem:   Vertigo Active Problems:   Morbid obesity (HCC)   Migraine   Anemia   Leukocytosis   Numbness in feet    Palpitation   Vertigo: Etiology for patient's dizziness is not clear. MRI of brain finding is not impressive. Her dizziness is aggravated by position change, indicating possible benign paroxysmal positional vertigo. Complex migraine is also on differential diagnoses list.  -Patient will be placed on med-surg bed for obs -will treat symptomatically with Zofran for nausea and Prn meclizine for dizziness  -NS IV 125 cc/h  -check orthostatic vital signs -Vestibular PT -check UDS -may need to consult to neurology if not getting better.  Numbness in feet: Patient states that she has numbness in both feet. She also has weakness and leg pain bilaterally. On physical examination. Her knee reflex  is normal. Muscle strength seems to be okay. No recent viral infection, Guiliain Barre's syndrome is less likely. Total CK is 41. The etiology for numbness in feet is not clear. Patient reports history of lower back injury 6 months ago, will need to r/o lumbar spin issues. -will get MRI-lumbar spin w/ and w/o constrast -prn percocet and tylenol for pain  Palpitation: Etiology is not clear. Patient has tachycardia on admission. She took Norethindrone Acetate-Ethinyl Estradiol pill recently, which increases the risk of blood clot and PE. Patient does not have chest pain, less likely to have ACS.  -will check D-dimer, if positive>will get CTA to r/o PE. -check TSH to r/o thyroid dysfunction -Hydration with IV fluid as above  Migraine: has mild HA. -continue home Topamax  Anemia: hgb is 11.8. -f/u by CBC  Leukocytosis: WBC 15.3. No fever. No source of infection identified. UA is negative. No respiratory symptoms. Likely due to stress induced to demargination -will follow up blood -follow up by CBC  DVT ppx: SQ Lovenox Code Status: Full code Family Communication: None at bed side.  Disposition Plan:  Anticipate discharge back to previous home environment Consults called:  none Admission status:   medical floor/obs  Date of Service 12/09/2016    ,  [facility] Pager [phone]  If 7PM-7AM, please contact night-coverage [email or web address] Password [email or web address] 12/09/2016, 12:33 AM  

## 2016-12-08 NOTE — ED Provider Notes (Signed)
[facility] DEPT Provider Note   CSN: [id number] Arrival date & time: 12/08/16  1529     History   Chief Complaint Chief Complaint  Patient presents with  . Dizziness    HPI Tanya [name] is a 36 y.o. female.  36 year old female with history of morbid obesity presents with several day history of dizziness that is worse with standing or moving her head. Today's associated bilateral lower extremity leg pain with some paresthesias on the bottom of her feet. Denies any bowel or bladder dysfunction. Denies any back pain. No ear pain or fullness. Mild headache appreciated without visual changes. Was seen here for similar symptoms 3 days ago and workup was reviewed and was without acute findings. She also notes that she has had increased blood pressure but denies any history of diabetes or hypertension. Symptoms better with remaining still and worse when she stands up. No associated chest pain or shortness of breath.      Past Medical History:  Diagnosis Date  . Anemia   . Anxiety   . Arthritis    L knee OA  . Depression    history of depression in 12-20-10 with death of mother  . Migraine     Patient Active Problem List   Diagnosis Date Noted  . Menometrorrhagia 12/05/2016  . Morbid obesity ([place]) 12/05/2016  . Chronic migraine without aura without status migrainosus, not intractable 12/05/2016  . Ruptured left tubal ectopic pregnancy causing hemoperitoneum 06/10/2014    Past Surgical History:  Procedure Laterality Date  . CESAREAN SECTION    . CHOLECYSTECTOMY    . HAND SURGERY    . KNEE SURGERY    . LAPAROSCOPIC UNILATERAL SALPINGECTOMY Left 06/10/2014   Procedure: LAPAROSCOPIC Left SALPINGECTOMY, removal of ectopic pregnancy;  Surgeon: Guss Bunde, MD;  Location: [facility];  Service: Gynecology;  Laterality: Left;  . TUBAL LIGATION    . WISDOM TOOTH EXTRACTION      OB History    Gravida Para Term Preterm AB Living   '3 2 1     2   ' SAB TAB Ectopic Multiple Live  Births           2       Home Medications    Prior to Admission medications   Medication Sig Start Date End Date Taking? Authorizing Provider  acetaminophen (TYLENOL) 500 MG tablet Take 1,000 mg by mouth every 6 (six) hours as needed.   Yes Historical Provider, MD  flintstones complete (FLINTSTONES) 60 MG chewable tablet Chew 2 tablets by mouth daily.   Yes Historical Provider, MD  topiramate (TOPAMAX) 100 MG tablet Take 200 mg by mouth 2 (two) times daily. 08/20/15  Yes Historical Provider, MD  Norethindrone Acetate-Ethinyl Estradiol (JUNEL,LOESTRIN,MICROGESTIN) 1.5-30 MG-MCG tablet Take 1 tablet by mouth daily. Patient not taking: Reported on 12/05/2016 11/06/16   Wardell Honour, MD    Family History Family History  Problem Relation Age of Onset  . Breast cancer Mother 27  . Cancer Mother   . Prostate cancer Father 38  . Breast cancer Maternal Aunt     dx in her 85s; BRCA neg  . Prostate cancer Paternal Uncle   . Prostate cancer Paternal Grandfather   . Ovarian cancer Sister 39    found during her pregnancy  . Prostate cancer Paternal Uncle   . Prostate cancer Paternal Uncle   . Cancer Other   . Stroke Other   . Diabetes Other   . Coronary artery disease Other  Social History Social History  Substance Use Topics  . Smoking status: Never Smoker  . Smokeless tobacco: Never Used  . Alcohol use Yes     Comment: socially     Allergies   Imitrex [sumatriptan]; Zomig [zolmitriptan]; Latex; and Sulfa antibiotics   Review of Systems Review of Systems  All other systems reviewed and are negative.    Physical Exam Updated Vital Signs BP 171/97 (BP Location: Left Arm)   Pulse 103   Temp 98 F (36.7 C) (Oral)   Resp 18   Ht '5\' 1"'  (1.549 m)   Wt 127 kg   LMP 12/04/2016 (Approximate)   SpO2 100%   BMI 52.91 kg/m   Physical Exam  Constitutional: She is oriented to person, place, and time. She appears well-developed and well-nourished.  Non-toxic appearance. No  distress.  HENT:  Head: Normocephalic and atraumatic.  Eyes: Conjunctivae, EOM and lids are normal. Pupils are equal, round, and reactive to light.  Neck: Normal range of motion. Neck supple. No tracheal deviation present. No thyroid mass present.  Cardiovascular: Normal rate, regular rhythm and normal heart sounds.  Exam reveals no gallop.   No murmur heard. Pulmonary/Chest: Effort normal and breath sounds normal. No stridor. No respiratory distress. She has no decreased breath sounds. She has no wheezes. She has no rhonchi. She has no rales.  Abdominal: Soft. Normal appearance and bowel sounds are normal. She exhibits no distension. There is no tenderness. There is no rebound and no CVA tenderness.  Musculoskeletal: Normal range of motion. She exhibits no edema or tenderness.  Neurological: She is alert and oriented to person, place, and time. She has normal strength. No cranial nerve deficit or sensory deficit. She displays no seizure activity. Coordination normal. GCS eye subscore is 4. GCS verbal subscore is 5. GCS motor subscore is 6.  Positive horizontal nystagmus noted.  Skin: Skin is warm and dry. No abrasion and no rash noted.  Psychiatric: She has a normal mood and affect. Her speech is normal and behavior is normal.  Nursing note and vitals reviewed.    ED Treatments / Results  Labs (all labs ordered are listed, but only abnormal results are displayed) Labs Reviewed  CBC - Abnormal; Notable for the following:       Result Value   WBC 15.3 (*)    RBC 5.38 (*)    Hemoglobin 11.8 (*)    MCV 70.3 (*)    MCH 21.9 (*)    Platelets 597 (*)    All other components within normal limits  BASIC METABOLIC PANEL  URINALYSIS, ROUTINE W REFLEX MICROSCOPIC  CK  CBG MONITORING, ED    EKG  EKG Interpretation  Date/Time:  Monday December 08 2016 15:38:37 EST Ventricular Rate:  100 PR Interval:    QRS Duration: 71 QT Interval:  315 QTC Calculation: 407 R Axis:   64 Text  Interpretation:  Sinus tachycardia Confirmed by Zenia Resides  MD, Shaquanta Harkless ([id number]) on 12/08/2016 6:23:09 PM       Radiology No results found.  Procedures Procedures (including critical care time)  Medications Ordered in ED Medications  0.9 %  sodium chloride infusion (not administered)  LORazepam (ATIVAN) injection 0.5 mg (not administered)  meclizine (ANTIVERT) tablet 25 mg (not administered)     Initial Impression / Assessment and Plan / ED Course  I have reviewed the triage vital signs and the nursing notes.  Pertinent labs & imaging results that were available during my care of the patient were  reviewed by me and considered in my medical decision making (see chart for details).     Patient complains of dizziness that is positional and also worse with standing. Denies any associated chest pain or chest pressure. She is tachycardic. However she is not dyspneic. Do not think that this is pulmonary embolism. Her pulse oximetry is 100% room air. She is not tachypnea. Patient MRI of her brain which was negative for acute process. Medicated with Ativan and Antivert remains symptomatic. Suspect that she has vertigo. She does have subjective numbness to the bottom of her feet but does not have any focal deficits. She does have horizontal nystagmus. Will consult hospitalist for admission  Final Clinical Impressions(s) / ED Diagnoses   Final diagnoses:  None    New Prescriptions New Prescriptions   No medications on file     Lacretia Leigh, MD 12/08/16 2354

## 2016-12-08 NOTE — ED Notes (Signed)
Attempt to collect vitals x2, pt not in room--currently completing MRI. Will collect vitals upon return. [employer]

## 2016-12-09 ENCOUNTER — Ambulatory Visit: Payer: 59 | Admitting: Family Medicine

## 2016-12-09 ENCOUNTER — Observation Stay (HOSPITAL_COMMUNITY): Payer: 59

## 2016-12-09 DIAGNOSIS — R002 Palpitations: Secondary | ICD-10-CM | POA: Diagnosis present

## 2016-12-09 DIAGNOSIS — R200 Anesthesia of skin: Secondary | ICD-10-CM | POA: Diagnosis present

## 2016-12-09 LAB — RAPID URINE DRUG SCREEN, HOSP PERFORMED
Amphetamines: NOT DETECTED
BARBITURATES: NOT DETECTED
Benzodiazepines: NOT DETECTED
Cocaine: NOT DETECTED
Opiates: NOT DETECTED
Tetrahydrocannabinol: NOT DETECTED

## 2016-12-09 LAB — CBC
HCT: 33 % — ABNORMAL LOW (ref 36.0–46.0)
Hemoglobin: 10.3 g/dL — ABNORMAL LOW (ref 12.0–15.0)
MCH: 22.2 pg — AB (ref 26.0–34.0)
MCHC: 31.2 g/dL (ref 30.0–36.0)
MCV: 71.1 fL — ABNORMAL LOW (ref 78.0–100.0)
PLATELETS: 448 10*3/uL — AB (ref 150–400)
RBC: 4.64 MIL/uL (ref 3.87–5.11)
RDW: 15.8 % — AB (ref 11.5–15.5)
WBC: 10.6 10*3/uL — AB (ref 4.0–10.5)

## 2016-12-09 LAB — HCG, QUANTITATIVE, PREGNANCY: hCG, Beta Chain, Quant, S: <1 m[IU]/mL (ref <5)

## 2016-12-09 LAB — D-DIMER, QUANTITATIVE: D-Dimer, Quant: 0.37 ug/mL-FEU (ref 0.00–0.50)

## 2016-12-09 LAB — BASIC METABOLIC PANEL
Anion gap: 6 (ref 5–15)
BUN: 16 mg/dL (ref 6–20)
CALCIUM: 8.3 mg/dL — AB (ref 8.9–10.3)
CO2: 20 mmol/L — ABNORMAL LOW (ref 22–32)
Chloride: 112 mmol/L — ABNORMAL HIGH (ref 101–111)
Creatinine, Ser: 0.73 mg/dL (ref 0.44–1.00)
GFR calc non Af Amer: >60 mL/min (ref >60)
Glucose, Bld: 92 mg/dL (ref 65–99)
Potassium: 3.4 mmol/L — ABNORMAL LOW (ref 3.5–5.1)
SODIUM: 138 mmol/L (ref 135–145)

## 2016-12-09 LAB — VITAMIN B12: VITAMIN B 12: 337 pg/mL (ref 180–914)

## 2016-12-09 LAB — TROPONIN I: Troponin I: <0.03 ng/mL (ref <0.03)

## 2016-12-09 LAB — TSH: TSH: 2.646 u[IU]/mL (ref 0.350–4.500)

## 2016-12-09 MED ORDER — GADOBENATE DIMEGLUMINE 529 MG/ML IV SOLN
20.0000 mL | Freq: Once | INTRAVENOUS | Status: AC | PRN
  Administered 2016-12-09: 20 mL via INTRAVENOUS

## 2016-12-09 MED ORDER — TOPIRAMATE 100 MG PO TABS
200.0000 mg | ORAL_TABLET | Freq: Two times a day (BID) | ORAL | Status: DC
  Administered 2016-12-09 – 2016-12-12 (×8): 200 mg via ORAL
  Filled 2016-12-09 (×8): qty 2

## 2016-12-09 MED ORDER — MECLIZINE HCL 25 MG PO TABS
25.0000 mg | ORAL_TABLET | Freq: Three times a day (TID) | ORAL | Status: DC | PRN
  Filled 2016-12-09 (×2): qty 1

## 2016-12-09 MED ORDER — CYANOCOBALAMIN 1000 MCG/ML IJ SOLN
1000.0000 ug | Freq: Every day | INTRAMUSCULAR | Status: DC
  Administered 2016-12-10: 1000 ug via SUBCUTANEOUS
  Filled 2016-12-09 (×2): qty 1

## 2016-12-09 MED ORDER — POTASSIUM CHLORIDE CRYS ER 20 MEQ PO TBCR
40.0000 meq | EXTENDED_RELEASE_TABLET | Freq: Once | ORAL | Status: AC
  Administered 2016-12-09: 40 meq via ORAL
  Filled 2016-12-09: qty 2

## 2016-12-09 MED ORDER — ONDANSETRON HCL 4 MG/2ML IJ SOLN
4.0000 mg | Freq: Four times a day (QID) | INTRAMUSCULAR | Status: DC | PRN
  Administered 2016-12-09 – 2016-12-12 (×3): 4 mg via INTRAVENOUS
  Filled 2016-12-09 (×6): qty 2

## 2016-12-09 MED ORDER — MECLIZINE HCL 25 MG PO TABS
25.0000 mg | ORAL_TABLET | Freq: Three times a day (TID) | ORAL | Status: DC
  Administered 2016-12-09 – 2016-12-12 (×10): 25 mg via ORAL
  Filled 2016-12-09 (×10): qty 1

## 2016-12-09 MED ORDER — ZOLPIDEM TARTRATE 5 MG PO TABS: 5.0000 mg | ORAL_TABLET | Freq: Every evening | ORAL | Status: DC | PRN

## 2016-12-09 MED ORDER — OXYCODONE-ACETAMINOPHEN 5-325 MG PO TABS
1.0000 | ORAL_TABLET | Freq: Four times a day (QID) | ORAL | Status: DC | PRN
  Administered 2016-12-09 – 2016-12-12 (×7): 1 via ORAL
  Filled 2016-12-09 (×8): qty 1

## 2016-12-09 MED ORDER — CENTRUM PO CHEW
1.0000 | CHEWABLE_TABLET | Freq: Every day | ORAL | Status: DC
  Administered 2016-12-09 – 2016-12-12 (×4): 1 via ORAL
  Filled 2016-12-09 (×5): qty 1

## 2016-12-09 MED ORDER — LORAZEPAM 2 MG/ML IJ SOLN
1.0000 mg | Freq: Once | INTRAMUSCULAR | Status: AC
  Administered 2016-12-09: 1 mg via INTRAVENOUS
  Filled 2016-12-09: qty 1

## 2016-12-09 MED ORDER — ACETAMINOPHEN 325 MG PO TABS
650.0000 mg | ORAL_TABLET | Freq: Four times a day (QID) | ORAL | Status: DC | PRN
  Administered 2016-12-11: 650 mg via ORAL
  Filled 2016-12-09 (×2): qty 2

## 2016-12-09 MED ORDER — ENOXAPARIN SODIUM 40 MG/0.4ML ~~LOC~~ SOLN
40.0000 mg | Freq: Every day | SUBCUTANEOUS | Status: DC
  Administered 2016-12-09 – 2016-12-12 (×4): 40 mg via SUBCUTANEOUS
  Filled 2016-12-09 (×5): qty 0.4

## 2016-12-09 MED ORDER — HYDRALAZINE HCL 20 MG/ML IJ SOLN
5.0000 mg | INTRAMUSCULAR | Status: DC | PRN
  Filled 2016-12-09: qty 1

## 2016-12-09 MED ORDER — ONDANSETRON HCL 4 MG PO TABS
4.0000 mg | ORAL_TABLET | Freq: Four times a day (QID) | ORAL | Status: DC | PRN
  Administered 2016-12-10: 4 mg via ORAL
  Filled 2016-12-09 (×2): qty 1

## 2016-12-09 NOTE — Progress Notes (Signed)
PROGRESS NOTE    Tanya [name]   [id number]  DOB: 1981-01-25  DOA: 12/08/2016 PCP: Reginia Forts, MD   Brief Narrative:  Tanya [name] is a 36 y.o. female with medical history significant of migraine headaches, depression, anxiety, anemia, arthritis, morbid obesity, who presents with dizziness palpitation, numbness in both feet and bilateral leg pain.  Pt states that her dizziness started suddenly after taking shower 4 days ago. Per pt, dizziness is exacerbated by position change of both her body and head. She sees spots in both eyes, but no vision loss. She has mild headache, but no neck rigidity. No ear ringing or hearing loss. She states that started having numbness in both feet and bilateral whole leg pain and mild leg weakness since yesterday. No slurred speech or facial droop. She stated that she had back injury 6 month ago and has intermittent lower back pain.   She also reports palpitation, but denies chest pain, SOB, fever, chills, cough. She has nausea, no vomiting, diarrhea or abdominal pain. Patient states that she took Norethindrone Acetate-Ethinyl Estradiol pill recently due to heavy menstrual period, but stopped taking it at the end of February. Pt denies symptoms of UTI. No recent viral infection. She states that she dose not have hx of hypertension, but had one episode of elevated blood pressure up to 200/105 yesterday. Patient was seen in ED on Friday, and treated for dehydration.    Subjective: Still feels like room is spinning when she turns her head in the bed. Legs hurt and feet are numb. Her husband confirms poor PO intake since Friday. She did [facility]uses [facility] and a headache cocktail on Sat. The headache went away but the rest of her symptoms did not.   Assessment & Plan:   Principal Problem:   Vertigo with tachycardia on standing - due to use of Oral contraceptives MRI brain checked and is normal - Vestibular eval today - Meclizine TID - cont  to hydrate for orthostatic component  Active Problems: Orthostatic hypotension - poor oral intake due to vertigo- need to continue IVF  Numbness in feet - Lumber MRI unrevealing - check B12 level - low normal- give s/c replacement today and tomorrow -  she can feel my touch but apparently it is not the same as when her legs are touched  Leukocytosis and thrombocytosis - likely due to hemoconcentration- improving with IVF  Pain in legs - Tylenol QID    Morbid obesity  Body mass index is 52.91 kg/m.    Migraine - no headache at this time    Anemia  - Hb chronically 10-11   DVT prophylaxis: Lovenox Code Status: Full code Family Communication: husband Disposition Plan: continue to follow on telemetry- may need 2 more nights in the hospital Consultants:     Procedures:     Antimicrobials:  Anti-infectives    None       Objective: Vitals:   12/09/16 0541 12/09/16 0950 12/09/16 1146 12/09/16 1357  BP: 116/64 122/73 111/74 (!) 148/82  Pulse: 81 (!) 101  (!) 107  Resp: 10 16    Temp:  97.8 F (36.6 C)    TempSrc:  Oral    SpO2: 100%     Weight:      Height:        Intake/Output Summary (Last 24 hours) at 12/09/16 1556 Last data filed at 12/09/16 1030  Gross per 24 hour  Intake  1120 ml  Output                0 ml  Net             1120 ml   Filed Weights   12/08/16 1535  Weight: 127 kg (280 lb)    Examination: General exam: Appears comfortable - was chatting with tech prior to me entering the room HEENT: PERRLA, oral mucosa moist, no sclera icterus or thrush Respiratory system: Clear to auscultation. Respiratory effort normal. Cardiovascular system: S1 & S2 heard, RRR.  No murmurs  Gastrointestinal system: Abdomen soft, non-tender, nondistended. Normal bowel sound. No organomegaly Central nervous system: Alert and oriented. No focal neurological deficits.- subjective numbness in b/l feet Extremities: No cyanosis, clubbing or edema- calves  are tender to touch Skin: No rashes or ulcers Psychiatry:  With me is Anxious, angry, confused, needs my explanations repeated to her    Data Reviewed: I have personally reviewed following labs and imaging studies  CBC:  Recent Labs Lab 12/05/16 1945 12/08/16 1555 12/09/16 0455  WBC 10.4 15.3* 10.6*  HGB 12.3 11.8* 10.3*  HCT 39.1 37.8 33.0*  MCV 71.2* 70.3* 71.1*  PLT 538* 597* [id number]*   Basic Metabolic Panel:  Recent Labs Lab 12/05/16 1945 12/08/16 1555 12/09/16 0455  NA 133* 139 138  K 3.9 3.7 3.4*  CL 103 110 112*  CO2 21* 22 20*  GLUCOSE 105* 98 92  BUN 15 19 16   CREATININE 0.77 0.79 0.73  CALCIUM 9.1 8.9 8.3*   GFR: Estimated Creatinine Clearance: 119.7 mL/min (by C-G formula based on SCr of 0.73 mg/dL). Liver Function Tests: No results for input(s): AST, ALT, ALKPHOS, BILITOT, PROT, ALBUMIN in the last 168 hours. No results for input(s): LIPASE, AMYLASE in the last 168 hours. No results for input(s): AMMONIA in the last 168 hours. Coagulation Profile: No results for input(s): INR, PROTIME in the last 168 hours. Cardiac Enzymes:  Recent Labs Lab 12/08/16 1555 12/09/16 0034 12/09/16 0455  CKTOTAL 41  --   --   TROPONINI  --  <0.03 <0.03   BNP (last 3 results) No results for input(s): PROBNP in the last 8760 hours. HbA1C: No results for input(s): HGBA1C in the last 72 hours. CBG:  Recent Labs Lab 12/08/16 1540 12/08/16 1859  GLUCAP 79 80   Lipid Profile: No results for input(s): CHOL, HDL, LDLCALC, TRIG, CHOLHDL, LDLDIRECT in the last 72 hours. Thyroid Function Tests:  Recent Labs  12/09/16 0455  TSH 2.646   Anemia Panel:  Recent Labs  12/09/16 0820  VITAMINB12 337   Urine analysis:    Component Value Date/Time   COLORURINE YELLOW 12/08/2016 1538   APPEARANCEUR HAZY (A) 12/08/2016 1538   LABSPEC 1.019 12/08/2016 1538   PHURINE 5.0 12/08/2016 1538   GLUCOSEU NEGATIVE 12/08/2016 1538   HGBUR MODERATE (A) 12/08/2016 1538    BILIRUBINUR NEGATIVE 12/08/2016 1538   KETONESUR NEGATIVE 12/08/2016 1538   PROTEINUR NEGATIVE 12/08/2016 1538   UROBILINOGEN 1.0 06/09/2014 1714   NITRITE NEGATIVE 12/08/2016 1538   LEUKOCYTESUR NEGATIVE 12/08/2016 1538   Sepsis Labs: @LABRCNTIP (procalcitonin:4,lacticidven:4) )No results found for this or any previous visit (from the past 240 hour(s)).       Radiology Studies: Mr Brain Wo Contrast  Result Date: 12/08/2016 CLINICAL DATA:  36 y/o  F; recurrent headaches. EXAM: MRI HEAD WITHOUT CONTRAST TECHNIQUE: Multiplanar, multiecho pulse sequences of the brain and surrounding structures were obtained without intravenous contrast. COMPARISON:  06/04/2014 CT head.  10/05/2007  MRI of the brain. FINDINGS: Brain: No acute infarction, hemorrhage, hydrocephalus, extra-axial collection or mass lesion. Punctate foci of T2 FLAIR hyperintensity in right frontal subcortical white matter and right posterior temporal subcortical white matter. Vascular: Normal flow voids. Skull and upper cervical spine: Normal marrow signal. Sinuses/Orbits: Mild maxillary sinus mucosal thickening. No abnormal signal of mastoid air cells. Orbits are unremarkable. Other: None. IMPRESSION: 1. No acute intracranial abnormality. 2. Two punctate T2 FLAIR hyperintense foci in white matter of unlikely clinical significance, possibly migraine headache associated. 3. Mild maxillary sinus mucosal thickening. Electronically Signed   By: Kristine Garbe M.D.   On: 12/08/2016 20:53   Mr Lumbar Spine W Wo Contrast  Result Date: 12/09/2016 CLINICAL DATA:  Dizziness. Bilateral lower extremity weakness and pain. Numbness in both feet. EXAM: MRI LUMBAR SPINE WITHOUT AND WITH CONTRAST TECHNIQUE: Multiplanar and multiecho pulse sequences of the lumbar spine were obtained without and with intravenous contrast. CONTRAST:  20 ml MULTIHANCE GADOBENATE DIMEGLUMINE 529 MG/ML IV SOLN COMPARISON:  CT abdomen and pelvis 06/10/2014. FINDINGS:  Segmentation:  Standard. Alignment:  Maintained. Vertebrae: Height and signal are normal. Hemangioma in the posterior left ilium is incidentally noted. Conus medullaris: Extends to the L1-2: Level and appears normal. Paraspinal and other soft tissues: There is some subcutaneous edema in the fatty soft tissues of the back likely due to dependent change. Imaged intra-abdominal contents appear normal. Disc levels: The T12-L1 to L4-5 levels are normal. L5-S1: Very shallow central protrusion. The central canal and foramina are widely patent. IMPRESSION: Shallow central protrusion L5-S1 without central canal or foraminal stenosis. The lumbar spine is otherwise normal in appearance. No finding to explain the patient's symptoms. Electronically Signed   By: Inge Rise M.D.   On: 12/09/2016 09:22      Scheduled Meds: . enoxaparin (LOVENOX) injection  40 mg Subcutaneous Daily  . multivitamin-iron-minerals-folic acid  1 tablet Oral Daily  . topiramate  200 mg Oral BID   Continuous Infusions: . sodium chloride Stopped (12/09/16 0000)     LOS: 0 days    Time spent in minutes: 21    [place], MD [facility] Pager: [email or web address] Password [facility] 12/09/2016, 3:56 PM

## 2016-12-09 NOTE — ED Notes (Signed)
Off floor for testing 

## 2016-12-09 NOTE — ED Notes (Signed)
Paged hospitalist-patient requested ativan r/t claustrophobia and MRI

## 2016-12-09 NOTE — Progress Notes (Signed)
Spoke to Dr. Wynelle Cleveland about pt HR spiking to 160 with ambulation to bathroom. Pt then became dizzy, nauseous, and pale. Dr. Wynelle Cleveland stated to keep patient in bed until she came to see her.

## 2016-12-09 NOTE — ED Notes (Signed)
Patient denies pain and is resting comfortably.  

## 2016-12-09 NOTE — Progress Notes (Signed)
Patient refusing to use provided BSC. Patient ambulatory in hallway to restroom. Patients heart rate increased while ambulating. Family member at bedside. Explained to patient why she needs to use [facility], patient states "the doctor dont care, so why should I" explained to patient again the reasoning why she needs to use [facility], patient states understanding. Patient placed back in bed, family remains at bedside. No needs voiced.

## 2016-12-09 NOTE — Evaluation (Signed)
Physical Therapy Evaluation Patient Details Name: Tanya [name] MRN: [id number] DOB: 10-23-1980 Today's Date: 12/09/2016   History of Present Illness  36 y.o. female with medical history significant of migraine headaches, depression, anxiety, anemia, arthritis, morbid obesity, who presents with dizziness, palpitation, numbness in both feet and bilateral leg pain  Clinical Impression  Pt admitted with above diagnosis. Pt currently with functional limitations due to the deficits listed below (see PT Problem List).  Pt will benefit from skilled PT to increase their independence and safety with mobility to allow discharge to the venue listed below.    Vestibular Exam: Performed oculomotor testing and pt had difficulty maintaining focus on smooth pursuits mostly in upwards and downwards gazes.  Pt c/o double vision at rest and with saccades and would not even perform head shaking.  She c/o of inability to focus with any tasks.  Pt states dizziness, "loopiness" started on Friday and has not subsided since.  Aggravated by positional changes and ambulating.  Improved with rest however states never fully goes away.  Bilateral Dix hallpike positional testing performed and no nystagmus observed, pt reports frontal pressure and dizziness is aggravated and not alleviated until she sits upright again - neither side worse then the other.  Pt reports elevated BP since admission and she states she typically runs normal.  Pt's HR and BP appear to elevate with activity as well possibly contributing to her symptoms.  Pt provided with outpatient neurorehab PT referral handout in case symptoms remain or become worse upon d/c.    Follow Up Recommendations Outpatient PT;Supervision/Assistance - 24 hour    Equipment Recommendations  None recommended by PT (pt declines using RW)    Recommendations for Other Services       Precautions / Restrictions Precautions Precautions: Fall Precaution Comments: monitor vitals       Mobility  Bed Mobility Overal bed mobility: Modified Independent                Transfers Overall transfer level: Needs assistance Equipment used: None Transfers: Sit to/from Stand Sit to Stand: Supervision            Ambulation/Gait Ambulation/Gait assistance: Min guard Ambulation Distance (Feet): 200 Feet Assistive device: None Gait Pattern/deviations: Step-through pattern     General Gait Details: pt reports 8/10 dizziness with ambulation, LE buckled however pt able to self correct (states "just my knee, it gives way sometimes") HR 134 bpm and BP 137/104 mmHg upon returning to bed  Stairs            Wheelchair Mobility    Modified Rankin (Stroke Patients Only)       Balance                                             Pertinent Vitals/Pain Pain Assessment: Faces Faces Pain Scale: Hurts even more Pain Location: bil legs and feet Pain Descriptors / Indicators: Numbness;Tingling Pain Intervention(s): Monitored during session    Home Living Family/patient expects to be discharged to:: Private residence Living Arrangements: Spouse/significant other     Home Access: Stairs to enter   Technical brewer of Steps: 3rd floor apt Home Layout: One level Home Equipment: None      Prior Function Level of Independence: Independent               Hand Dominance  Extremity/Trunk Assessment        Lower Extremity Assessment Lower Extremity Assessment: LLE deficits/detail;RLE deficits/detail RLE Deficits / Details: reports hx of numbness and tingling in bil feet however reports new hypersensitivity in legs LLE Deficits / Details: reports hx of numbness and tingling in bil feet however reports new hypersensitivity in legs       Communication   Communication: No difficulties  Cognition Arousal/Alertness: Awake/alert Behavior During Therapy: WFL for tasks assessed/performed Overall Cognitive Status:  Within Functional Limits for tasks assessed                      General Comments      Exercises     Assessment/Plan    PT Assessment Patient needs continued PT services  PT Problem List Decreased mobility;Decreased activity tolerance;Impaired sensation;Other (comment) (dizziness with mobility)       PT Treatment Interventions DME instruction;Gait training;Therapeutic exercise;Therapeutic activities;Patient/family education;Functional mobility training;Stair training    PT Goals (Current goals can be found in the Care Plan section)  Acute Rehab PT Goals PT Goal Formulation: With patient Time For Goal Achievement: 12/13/16 Potential to Achieve Goals: Good    Frequency Min 3X/week   Barriers to discharge        Co-evaluation               End of Session Equipment Utilized During Treatment:  (pt refused gait belt) Activity Tolerance: Patient tolerated treatment well Patient left: in bed;with call bell/phone within reach;with family/visitor present Nurse Communication: Mobility status PT Visit Diagnosis: Difficulty in walking, not elsewhere classified (R26.2)    Functional Assessment Tool Used: Clinical judgement;AM-PAC 6 Clicks Basic Mobility Functional Limitation: Mobility: Walking and moving around Mobility: Walking and Moving Around Current Status [id number]): At least 20 percent but less than 40 percent impaired, limited or restricted Mobility: Walking and Moving Around Goal Status [phone]): At least 1 percent but less than 20 percent impaired, limited or restricted    Time: 1530-1604 PT Time Calculation (min) (ACUTE ONLY): 34 min   Charges:   PT Evaluation $PT Eval Moderate Complexity: 1 Procedure     PT G Codes:   PT G-Codes **NOT FOR INPATIENT CLASS** Functional Assessment Tool Used: Clinical judgement;AM-PAC 6 Clicks Basic Mobility Functional Limitation: Mobility: Walking and moving around Mobility: Walking and Moving Around Current Status  [id number]): At least 20 percent but less than 40 percent impaired, limited or restricted Mobility: Walking and Moving Around Goal Status [phone]): At least 1 percent but less than 20 percent impaired, limited or restricted     Keng Jewel,KATHrine E 12/09/2016, 4:39 PM Carmelia Bake, PT, DPT 12/09/2016 Pager: [phone]

## 2016-12-09 NOTE — ED Notes (Addendum)
Pt. REFUSED to be admitted to room assigned, 3rd floor , stating that " my mom died there and its still a trauma to me and will just make me more sick." pt. Is willing to wait for a bed in 5th floor than being admitted in 3rdfloor. Hospitalist paged. To notify Camera operator.

## 2016-12-09 NOTE — Progress Notes (Signed)
PT Cancellation Note  Patient Details Name: Tanya [name] MRN: [id number] DOB: 1980/10/11   Cancelled Treatment:    Reason Eval/Treat Not Completed: Medical issues which prohibited therapy (recently ambulated with RN, was dizzy, increased HR.)will check back after MD sees patient.   Marcelino Freestone PT [id number]  12/09/2016, 1:28 PM

## 2016-12-09 NOTE — ED Notes (Signed)
Report given to Sierra,RN

## 2016-12-10 ENCOUNTER — Encounter (HOSPITAL_COMMUNITY): Payer: Self-pay | Admitting: Radiology

## 2016-12-10 ENCOUNTER — Observation Stay (HOSPITAL_COMMUNITY): Payer: 59

## 2016-12-10 DIAGNOSIS — R0789 Other chest pain: Secondary | ICD-10-CM | POA: Diagnosis not present

## 2016-12-10 DIAGNOSIS — R0602 Shortness of breath: Secondary | ICD-10-CM | POA: Diagnosis not present

## 2016-12-10 DIAGNOSIS — I951 Orthostatic hypotension: Secondary | ICD-10-CM | POA: Diagnosis present

## 2016-12-10 DIAGNOSIS — F419 Anxiety disorder, unspecified: Secondary | ICD-10-CM | POA: Diagnosis present

## 2016-12-10 DIAGNOSIS — N920 Excessive and frequent menstruation with regular cycle: Secondary | ICD-10-CM | POA: Diagnosis present

## 2016-12-10 DIAGNOSIS — I471 Supraventricular tachycardia, unspecified: Secondary | ICD-10-CM

## 2016-12-10 DIAGNOSIS — Z9049 Acquired absence of other specified parts of digestive tract: Secondary | ICD-10-CM | POA: Diagnosis not present

## 2016-12-10 DIAGNOSIS — D473 Essential (hemorrhagic) thrombocythemia: Secondary | ICD-10-CM

## 2016-12-10 DIAGNOSIS — R200 Anesthesia of skin: Secondary | ICD-10-CM | POA: Diagnosis not present

## 2016-12-10 DIAGNOSIS — D519 Vitamin B12 deficiency anemia, unspecified: Secondary | ICD-10-CM | POA: Diagnosis not present

## 2016-12-10 DIAGNOSIS — R42 Dizziness and giddiness: Secondary | ICD-10-CM | POA: Diagnosis not present

## 2016-12-10 DIAGNOSIS — D508 Other iron deficiency anemias: Secondary | ICD-10-CM

## 2016-12-10 DIAGNOSIS — R002 Palpitations: Secondary | ICD-10-CM | POA: Diagnosis not present

## 2016-12-10 DIAGNOSIS — Z90721 Acquired absence of ovaries, unilateral: Secondary | ICD-10-CM | POA: Diagnosis not present

## 2016-12-10 DIAGNOSIS — R0600 Dyspnea, unspecified: Secondary | ICD-10-CM | POA: Diagnosis not present

## 2016-12-10 DIAGNOSIS — D500 Iron deficiency anemia secondary to blood loss (chronic): Secondary | ICD-10-CM | POA: Diagnosis present

## 2016-12-10 DIAGNOSIS — Z881 Allergy status to other antibiotic agents status: Secondary | ICD-10-CM | POA: Diagnosis not present

## 2016-12-10 DIAGNOSIS — Z79899 Other long term (current) drug therapy: Secondary | ICD-10-CM | POA: Diagnosis not present

## 2016-12-10 DIAGNOSIS — G43809 Other migraine, not intractable, without status migrainosus: Secondary | ICD-10-CM | POA: Diagnosis present

## 2016-12-10 DIAGNOSIS — T384X5A Adverse effect of oral contraceptives, initial encounter: Secondary | ICD-10-CM | POA: Diagnosis present

## 2016-12-10 DIAGNOSIS — F329 Major depressive disorder, single episode, unspecified: Secondary | ICD-10-CM | POA: Diagnosis present

## 2016-12-10 DIAGNOSIS — E860 Dehydration: Secondary | ICD-10-CM | POA: Diagnosis present

## 2016-12-10 DIAGNOSIS — Z6841 Body Mass Index (BMI) 40.0 and over, adult: Secondary | ICD-10-CM | POA: Diagnosis not present

## 2016-12-10 DIAGNOSIS — Z91040 Latex allergy status: Secondary | ICD-10-CM | POA: Diagnosis not present

## 2016-12-10 DIAGNOSIS — Z888 Allergy status to other drugs, medicaments and biological substances status: Secondary | ICD-10-CM | POA: Diagnosis not present

## 2016-12-10 DIAGNOSIS — R000 Tachycardia, unspecified: Secondary | ICD-10-CM

## 2016-12-10 DIAGNOSIS — D75839 Thrombocytosis, unspecified: Secondary | ICD-10-CM

## 2016-12-10 DIAGNOSIS — M1712 Unilateral primary osteoarthritis, left knee: Secondary | ICD-10-CM | POA: Diagnosis present

## 2016-12-10 DIAGNOSIS — I5032 Chronic diastolic (congestive) heart failure: Secondary | ICD-10-CM | POA: Diagnosis present

## 2016-12-10 DIAGNOSIS — D72829 Elevated white blood cell count, unspecified: Secondary | ICD-10-CM | POA: Diagnosis not present

## 2016-12-10 DIAGNOSIS — R0682 Tachypnea, not elsewhere classified: Secondary | ICD-10-CM

## 2016-12-10 LAB — HIV ANTIBODY (ROUTINE TESTING W REFLEX): HIV Screen 4th Generation wRfx: NONREACTIVE

## 2016-12-10 MED ORDER — IOPAMIDOL (ISOVUE-370) INJECTION 76%
INTRAVENOUS | Status: AC
  Filled 2016-12-10: qty 100

## 2016-12-10 MED ORDER — GI COCKTAIL ~~LOC~~
30.0000 mL | Freq: Three times a day (TID) | ORAL | Status: DC | PRN
  Administered 2016-12-10: 30 mL via ORAL
  Filled 2016-12-10 (×3): qty 30

## 2016-12-10 MED ORDER — VITAMIN B-12 1000 MCG PO TABS
1000.0000 ug | ORAL_TABLET | Freq: Every day | ORAL | Status: DC
  Administered 2016-12-11 – 2016-12-12 (×2): 1000 ug via ORAL
  Filled 2016-12-10 (×2): qty 1

## 2016-12-10 MED ORDER — PANTOPRAZOLE SODIUM 40 MG PO TBEC
40.0000 mg | DELAYED_RELEASE_TABLET | Freq: Every day | ORAL | Status: DC
  Administered 2016-12-10 – 2016-12-12 (×3): 40 mg via ORAL
  Filled 2016-12-10 (×3): qty 1

## 2016-12-10 MED ORDER — IOPAMIDOL (ISOVUE-370) INJECTION 76%
100.0000 mL | Freq: Once | INTRAVENOUS | Status: AC | PRN
  Administered 2016-12-10: 100 mL via INTRAVENOUS

## 2016-12-10 NOTE — Progress Notes (Signed)
PROGRESS NOTE    Tanya [name]   QTM:[id number]  DOB: June 18, 1981  DOA: 12/08/2016 PCP: Reginia Forts, MD   Brief Narrative:  Tanya [name] is a 36 y.o. female with medical history significant of migraine headaches, depression, anxiety, anemia, arthritis, morbid obesity, who presents with dizziness palpitation, numbness in both feet and bilateral leg pain.  Pt states that her dizziness started suddenly after taking shower 4 days ago. Per pt, dizziness is exacerbated by position change of both her body and head. She sees spots in both eyes, but no vision loss. She has mild headache, but no neck rigidity. No ear ringing or hearing loss. She states that started having numbness in both feet and bilateral whole leg pain and mild leg weakness since yesterday. No slurred speech or facial droop. She stated that she had back injury 6 month ago and has intermittent lower back pain.   She also reports palpitation, but denies chest pain, SOB, fever, chills, cough. She has nausea, no vomiting, diarrhea or abdominal pain. Patient states that she took Norethindrone Acetate-Ethinyl Estradiol pill recently due to heavy menstrual period, but stopped taking it at the end of February. Pt denies symptoms of UTI. No recent viral infection. She states that she dose not have hx of hypertension, but had one episode of elevated blood pressure up to 200/105 yesterday. Patient was seen in ED on Friday, and treated for dehydration.    Subjective: Still feeling dizzy when standing; also with chest pain, tachypnea, chest discomfort and SOB feeling. No further HA's, no nausea, no vomiting.  Assessment & Plan:  Vertigo with tachycardia (SVT) on standing, chest discomfort and tachypnea/SOB  - due to use of Oral contraceptives D-dimer checked and neg; given continue symptoms will performed CTA -2-D echo also ordered (to further evaluate cause for SVT) -neg troponin -MRI brain checked and is normal -Vestibular eval not  reporting vertigo and patient symptoms not improving with meclizine -no longer orthostatic  -will decrease IVF's rate  Orthostatic hypotension: on admission  -resolved with IVF's -repeat orthostatic changes neg now  Numbness in feet -Lumbar MRI and brain MRI unrevealing -check B12 level - low normal- give s/c replacement for 2 days and start daily oral B12  Leukocytosis and thrombocytosis -likely due to hemoconcentration - improving with IVF -no source of infection seen  Pain in legs -Tylenol QID as needed for pain -B12 repletion initiated -will also replete potassium which is low    Morbid obesity  Body mass index is 52.91 kg/m. -low calorie diet and exercise discussed with patient -most likely have component of OSA/OHS    Migraine - no headache at this time -continue topamax     Anemia: with component of low normal B12 and low MCV - Hb chronically 10-11 (patient with heavy menstrual periods) -will discharge on iron supplement -started on B12 repletion as well  DVT prophylaxis: Lovenox Code Status: Full code Family Communication: husband at bedside Disposition Plan: continue to follow on telemetry; will check CT angio and 2-D echo. Base on results will discussed with cardiology service.  Consultants:    none  Procedures:    see below for x-ray reports  Antimicrobials:  Anti-infectives    None     Objective: Vitals:   12/09/16 2140 12/10/16 0632 12/10/16 0800 12/10/16 0822  BP: 109/71 (!) 102/57 (!) 142/106   Pulse: 94 72 91   Resp: 17 16    Temp: 98.7 F (37.1 C) 97.6 F (36.4 C)    TempSrc: Oral  Oral    SpO2: 92% 97%  100%  Weight:      Height:        Intake/Output Summary (Last 24 hours) at 12/10/16 1350 Last data filed at 12/10/16 0500  Gross per 24 hour  Intake              500 ml  Output              425 ml  Net               75 ml   Filed Weights   12/08/16 1535  Weight: 127 kg (280 lb)    Examination: General exam: Appears  uncomfortable, reporting chest discomfort, SOB and tachypnea. Patient also with SVT appreciated on telemetry with minimal exertion. HEENT: PERRLA, oral mucosa moist, no sclera icterus or thrush Respiratory system: Clear to auscultation. Patient was Tachypneic and seen to have slightly labour breathing Cardiovascular system: S1 & S2 heard, tachycardic.  No murmurs, no rubs  Gastrointestinal system: Abdomen soft, non-tender, nondistended. Normal bowel sound. No organomegaly Central nervous system: Alert and oriented. No focal neurological deficits.- subjective numbness in b/l feet Extremities: No cyanosis, clubbing or edema- calves are tender to touch Skin: No rashes or ulcers Psychiatry:  Calmed, able to follow commands and denying SI or hallucinations.   Data Reviewed: I have personally reviewed following labs and imaging studies  CBC:  Recent Labs Lab 12/05/16 1945 12/08/16 1555 12/09/16 0455  WBC 10.4 15.3* 10.6*  HGB 12.3 11.8* 10.3*  HCT 39.1 37.8 33.0*  MCV 71.2* 70.3* 71.1*  PLT 538* 597* 503*   Basic Metabolic Panel:  Recent Labs Lab 12/05/16 1945 12/08/16 1555 12/09/16 0455  NA 133* 139 138  K 3.9 3.7 3.4*  CL 103 110 112*  CO2 21* 22 20*  GLUCOSE 105* 98 92  BUN 15 19 16   CREATININE 0.77 0.79 0.73  CALCIUM 9.1 8.9 8.3*   GFR: Estimated Creatinine Clearance: 119.7 mL/min (by C-G formula based on SCr of 0.73 mg/dL).  Cardiac Enzymes:  Recent Labs Lab 12/08/16 1555 12/09/16 0034 12/09/16 0455  CKTOTAL 41  --   --   TROPONINI  --  <0.03 <0.03   CBG:  Recent Labs Lab 12/08/16 1540 12/08/16 1859  GLUCAP 79 80   Thyroid Function Tests:  Recent Labs  12/09/16 0455  TSH 2.646   Anemia Panel:  Recent Labs  12/09/16 0820  VITAMINB12 337   Urine analysis:    Component Value Date/Time   COLORURINE YELLOW 12/08/2016 1538   APPEARANCEUR HAZY (A) 12/08/2016 1538   LABSPEC 1.019 12/08/2016 1538   PHURINE 5.0 12/08/2016 1538   GLUCOSEU  NEGATIVE 12/08/2016 1538   HGBUR MODERATE (A) 12/08/2016 1538   BILIRUBINUR NEGATIVE 12/08/2016 1538   KETONESUR NEGATIVE 12/08/2016 1538   PROTEINUR NEGATIVE 12/08/2016 1538   UROBILINOGEN 1.0 06/09/2014 1714   NITRITE NEGATIVE 12/08/2016 1538   LEUKOCYTESUR NEGATIVE 12/08/2016 1538    Radiology Studies: Ct Angio Chest Pe W Or Wo Contrast  Result Date: 12/10/2016 CLINICAL DATA:  36 year old female with a history of vertigo and shortness of breath. EXAM: CT ANGIOGRAPHY CHEST WITH CONTRAST TECHNIQUE: Multidetector CT imaging of the chest was performed using the standard protocol during bolus administration of intravenous contrast. Multiplanar CT image reconstructions and MIPs were obtained to evaluate the vascular anatomy. CONTRAST:  100 cc Isovue 370 COMPARISON:  No prior CT chest FINDINGS: Cardiovascular: Heart: No cardiomegaly. No pericardial fluid/thickening. No significant coronary calcifications. Aorta: Unremarkable course,  caliber, contour of the thoracic aorta. No aneurysm or dissection flap. No periaortic fluid. Pulmonary arteries: Timing of the contrast bolus somewhat limits evaluation of the distal pulmonary arteries, however, there are no central, lobar, segmental or proximal subsegmental filling defects identified. Of note, the pulmonary artery diameters arch generous at the outer third of the lung, larger and then the accompanying bronchi. Diameter of the main pulmonary artery measures 2.9 cm. Mediastinum/Nodes: Mediastinal lymph nodes are present, none of which are enlarged by CT size criteria. Unremarkable appearance of the thoracic esophagus. Unremarkable appearance of the thoracic inlet and thyroid. Lungs/Pleura: Central airways are clear. No pleural effusion. No confluent left-sided airspace disease. No pneumothorax. Upper Abdomen: Cholecystectomy Musculoskeletal: No displaced fracture. Degenerative changes of the spine. Review of the MIP images confirms the above findings. IMPRESSION:  Study is negative for pulmonary emboli. There is subtle enlargement of the bilateral distal pulmonary arteries as compared to the accompanying bronchi at the outer 1/3 of the lungs. This can be seen with changes of early pulmonary hypertension, and referral for pulmonary evaluation may be considered. Electronically Signed   By: Corrie Mckusick D.O.   On: 12/10/2016 14:03   Mr Brain Wo Contrast  Result Date: 12/08/2016 CLINICAL DATA:  36 y/o  F; recurrent headaches. EXAM: MRI HEAD WITHOUT CONTRAST TECHNIQUE: Multiplanar, multiecho pulse sequences of the brain and surrounding structures were obtained without intravenous contrast. COMPARISON:  06/04/2014 CT head.  10/05/2007 MRI of the brain. FINDINGS: Brain: No acute infarction, hemorrhage, hydrocephalus, extra-axial collection or mass lesion. Punctate foci of T2 FLAIR hyperintensity in right frontal subcortical white matter and right posterior temporal subcortical white matter. Vascular: Normal flow voids. Skull and upper cervical spine: Normal marrow signal. Sinuses/Orbits: Mild maxillary sinus mucosal thickening. No abnormal signal of mastoid air cells. Orbits are unremarkable. Other: None. IMPRESSION: 1. No acute intracranial abnormality. 2. Two punctate T2 FLAIR hyperintense foci in white matter of unlikely clinical significance, possibly migraine headache associated. 3. Mild maxillary sinus mucosal thickening. Electronically Signed   By: Kristine Garbe M.D.   On: 12/08/2016 20:53   Mr Lumbar Spine W Wo Contrast  Result Date: 12/09/2016 CLINICAL DATA:  Dizziness. Bilateral lower extremity weakness and pain. Numbness in both feet. EXAM: MRI LUMBAR SPINE WITHOUT AND WITH CONTRAST TECHNIQUE: Multiplanar and multiecho pulse sequences of the lumbar spine were obtained without and with intravenous contrast. CONTRAST:  20 ml MULTIHANCE GADOBENATE DIMEGLUMINE 529 MG/ML IV SOLN COMPARISON:  CT abdomen and pelvis 06/10/2014. FINDINGS: Segmentation:  Standard.  Alignment:  Maintained. Vertebrae: Height and signal are normal. Hemangioma in the posterior left ilium is incidentally noted. Conus medullaris: Extends to the L1-2: Level and appears normal. Paraspinal and other soft tissues: There is some subcutaneous edema in the fatty soft tissues of the back likely due to dependent change. Imaged intra-abdominal contents appear normal. Disc levels: The T12-L1 to L4-5 levels are normal. L5-S1: Very shallow central protrusion. The central canal and foramina are widely patent. IMPRESSION: Shallow central protrusion L5-S1 without central canal or foraminal stenosis. The lumbar spine is otherwise normal in appearance. No finding to explain the patient's symptoms. Electronically Signed   By: Inge Rise M.D.   On: 12/09/2016 09:22      Scheduled Meds: . cyanocobalamin  1,000 mcg Subcutaneous Daily  . enoxaparin (LOVENOX) injection  40 mg Subcutaneous Daily  . iopamidol      . meclizine  25 mg Oral TID  . multivitamin-iron-minerals-folic acid  1 tablet Oral Daily  . pantoprazole  40 mg Oral Q1200  . topiramate  200 mg Oral BID   Continuous Infusions: . sodium chloride 75 mL/hr at 12/10/16 1307     LOS: 0 days    Time spent in minutes: [place], MD [facility] Pager: [phone] [email or web address] Password [email or web address] 12/10/2016, 1:50 PM

## 2016-12-10 NOTE — Progress Notes (Signed)
Patient refusing to wear SCD's.

## 2016-12-10 NOTE — Progress Notes (Signed)
Patient complaining of chest pressure and double vision.  BP 142/106. Notified Dr. Dyann Kief of situation and new orders received

## 2016-12-10 NOTE — Progress Notes (Signed)
Patient in bathroom taking a wash off with assistance from husband.  Heart rate increased to 150's advised patient to use bedside commode to rest  she refused.  Patient very hesitant to receive recommendations from staff.  Advised patient it was not safe to continue staying in restroom with elevated heart rate patient became upset that I came into restroom when heart rate was 160 to advise her to get back to bed.  Patient assisted back to room

## 2016-12-11 ENCOUNTER — Inpatient Hospital Stay (HOSPITAL_COMMUNITY): Payer: 59

## 2016-12-11 DIAGNOSIS — R0600 Dyspnea, unspecified: Secondary | ICD-10-CM

## 2016-12-11 DIAGNOSIS — I471 Supraventricular tachycardia: Principal | ICD-10-CM

## 2016-12-11 DIAGNOSIS — R42 Dizziness and giddiness: Secondary | ICD-10-CM

## 2016-12-11 DIAGNOSIS — R000 Tachycardia, unspecified: Secondary | ICD-10-CM

## 2016-12-11 LAB — BASIC METABOLIC PANEL
Anion gap: 7 (ref 5–15)
BUN: 14 mg/dL (ref 6–20)
CHLORIDE: 111 mmol/L (ref 101–111)
CO2: 23 mmol/L (ref 22–32)
CREATININE: 0.81 mg/dL (ref 0.44–1.00)
Calcium: 8.9 mg/dL (ref 8.9–10.3)
GFR calc Af Amer: >60 mL/min (ref >60)
GFR calc non Af Amer: >60 mL/min (ref >60)
Glucose, Bld: 100 mg/dL — ABNORMAL HIGH (ref 65–99)
POTASSIUM: 3.6 mmol/L (ref 3.5–5.1)
SODIUM: 141 mmol/L (ref 135–145)

## 2016-12-11 LAB — CBC
HEMATOCRIT: 35.1 % — AB (ref 36.0–46.0)
Hemoglobin: 10.8 g/dL — ABNORMAL LOW (ref 12.0–15.0)
MCH: 22.1 pg — AB (ref 26.0–34.0)
MCHC: 30.8 g/dL (ref 30.0–36.0)
MCV: 71.9 fL — AB (ref 78.0–100.0)
PLATELETS: 477 10*3/uL — AB (ref 150–400)
RBC: 4.88 MIL/uL (ref 3.87–5.11)
RDW: 15.8 % — AB (ref 11.5–15.5)
WBC: 8.5 10*3/uL (ref 4.0–10.5)

## 2016-12-11 LAB — ECHOCARDIOGRAM COMPLETE
Height: 61 in
Weight: 4480 oz

## 2016-12-11 MED ORDER — METOPROLOL TARTRATE 25 MG PO TABS
25.0000 mg | ORAL_TABLET | Freq: Two times a day (BID) | ORAL | Status: DC
  Administered 2016-12-11 – 2016-12-12 (×3): 25 mg via ORAL
  Filled 2016-12-11 (×3): qty 1

## 2016-12-11 NOTE — Progress Notes (Signed)
  Echocardiogram 2D Echocardiogram has been performed.  Bobbye Charleston 12/11/2016, 1:44 PM

## 2016-12-11 NOTE — Consult Note (Signed)
   [facility] CM Inpatient Consult   12/11/2016  PIEDAD STANDIFORD [birth year] [id number]    Telephone call to speak with [name] on behalf of Link to [facility] Care Management program for [facility] employees/dependents with [facility] insurance. Discussed Link to [employer]. Denies any current Link to Wellness needs. However, she indicates she has had some blood pressure problems as of late. 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 Reports she is still having tests and not sure of discharge. Made [name] aware that Probation officer will stop by room if she is still inpatient tomorrow since Probation officer is at different [employer] today. Confirmed best contact number as [phone] for post discharge call. Appreciative of call.    Marthenia Rolling, MSN-Ed, RN,BSN [facility] Liaison [phone]

## 2016-12-11 NOTE — Progress Notes (Signed)
PT Cancellation Note  Patient Details Name: Tanya [name] MRN: [id number] DOB: [birth year]   Cancelled Treatment:    Reason Eval/Treat Not Completed: Other (comment) Checked back on pt, she was standing at doorway and ambulating in room without difficulty.  Pt with further medical evaluation/tests pending.  She still has outpatient vestibular PT referral form if symptoms persist upon d/c.  PT to sign off at this time.   Alley Neils,KATHrine E 12/11/2016, 2:54 PM Carmelia Bake, PT, DPT 12/11/2016 Pager: [phone]

## 2016-12-11 NOTE — Progress Notes (Signed)
PROGRESS NOTE    Tanya [name]   DVV:[id number]  DOB: 09-30-1981  DOA: 12/08/2016 PCP: Reginia Forts, MD   Brief Narrative:  Tanya [name] is a 36 y.o. female with medical history significant of migraine headaches, depression, anxiety, anemia, arthritis, morbid obesity, who presents with dizziness palpitation, numbness in both feet and bilateral leg pain.  Pt states that her dizziness started suddenly after taking shower 4 days ago. Per pt, dizziness is exacerbated by position change of both her body and head. She sees spots in both eyes, but no vision loss. She has mild headache, but no neck rigidity. No ear ringing or hearing loss. She states that started having numbness in both feet and bilateral whole leg pain and mild leg weakness since yesterday. No slurred speech or facial droop. She stated that she had back injury 6 month ago and has intermittent lower back pain.   She also reports palpitation, but denies chest pain, SOB, fever, chills, cough. She has nausea, no vomiting, diarrhea or abdominal pain. Patient states that she took Norethindrone Acetate-Ethinyl Estradiol pill recently due to heavy menstrual period, but stopped taking it at the end of February. Pt denies symptoms of UTI. No recent viral infection. She states that she dose not have hx of hypertension, but had one episode of elevated blood pressure up to 200/105 yesterday. Patient was seen in ED on Friday, and treated for dehydration.    Subjective: Still feeling dizzy when standing; also with chest pain, tachypnea, chest discomfort and SOB feeling. No further HA's, no nausea, no vomiting.  Assessment & Plan:  Vertigo with tachycardia (SVT) on standing, chest discomfort and tachypnea/SOB  - due to use of Oral contraceptives D-dimer checked and neg; given continue symptoms  performed CTA, which was neg for PE. -2-D echo ordered (to further evaluate cause for SVT); normal EF, no wall motion abnormalities. -neg  troponin -MRI brain checked and is normal -Vestibular eval not reporting vertigo and patient symptoms not improving with meclizine -no longer orthostatic  -will decrease IVF's rate -started on metoprolol for SVT  Orthostatic hypotension: on admission  -resolved with IVF's -repeat orthostatic changes neg now  Numbness in feet -Lumbar MRI and brain MRI unrevealing -check B12 level - low normal- give s/c replacement for 2 days and start daily oral B12  Leukocytosis and thrombocytosis -likely due to hemoconcentration - improving with IVF -no source of infection seen  Pain in legs -Tylenol QID as needed for pain -B12 repletion initiated -will also replete potassium which is low    Morbid obesity  Body mass index is 52.91 kg/m. -low calorie diet and exercise discussed with patient -most likely have component of OSA/OHS    Migraine - no headache at this time -continue topamax     Anemia: with component of low normal B12 and low MCV - Hb chronically 10-11 (patient with heavy menstrual periods) -will discharge on iron supplement -started on B12 repletion as well    Chronic diastolic HF -low sodium diet and weight loss recommended  -started on low dose metoprolol BID -daily weights -strict I's and O's    DVT prophylaxis: Lovenox Code Status: Full code Family Communication: husband at bedside Disposition Plan: continue to follow on telemetry; follow response to metoprolol; no PE on CTA, 2-D echo without wall motion abnormalities, grade 1 diastolic dysfunction and high ventricular filling pressures. Will follow cardiology rec's.  Consultants:   Cardiology   Procedures:    see below for x-ray reports  CTA: neg for  PE. Positive enlargement of pulmonary arteries, suggesting pulmonary HTN  2-D echo - Left ventricle: The cavity size was normal. Wall thickness was   increased in a pattern of mild LVH. Systolic function was normal.   The estimated ejection fraction was in  the range of 60% to 65%.   Wall motion was normal; there were no regional wall motion   abnormalities. Doppler parameters are consistent with high   ventricular filling pressure.  Impressions: - Normal LV systolic function; mild LVH; grade 1 diastolic   function.  Antimicrobials:  Anti-infectives    None     Objective: Vitals:   12/10/16 2110 12/10/16 2300 12/11/16 0439 12/11/16 1500  BP: (!) 144/96 (!) 116/55 124/79 (!) 153/98  Pulse: 100 80 81 82  Resp: 20  18 18   Temp: 97.4 F (36.3 C)  97.9 F (36.6 C) 97.8 F (36.6 C)  TempSrc: Oral  Oral Oral  SpO2: 100%  98% 100%  Weight:      Height:        Intake/Output Summary (Last 24 hours) at 12/11/16 1801 Last data filed at 12/11/16 1400  Gross per 24 hour  Intake             1980 ml  Output                0 ml  Net             1980 ml   Filed Weights   12/08/16 1535  Weight: 127 kg (280 lb)    Examination: General exam: Appears ok today; less uncomfortable overall, but still reporting chest discomfort, SOB and tachypnea (especially with exertion). Patient also with SVT appreciated on telemetry once again today while ambulating to restroom. HEENT: PERRLA, oral mucosa moist, no sclera icterus or thrush Respiratory system: Clear to auscultation. Patient was Tachypneic and seen to have slightly labour breathing Cardiovascular system: S1 & S2 heard, tachycardic.  No murmurs, no rubs  Gastrointestinal system: Abdomen soft, non-tender, nondistended. Normal bowel sound. No organomegaly Central nervous system: Alert and oriented. No focal neurological deficits.- subjective numbness in b/l feet Extremities: No cyanosis, clubbing or edema- calves/thighs are tender to touch Skin: No rashes or ulcers Psychiatry:  Calmed, able to follow commands and denying SI or hallucinations. Appreciate interest and work up provided.   Data Reviewed: I have personally reviewed following labs and imaging studies  CBC:  Recent Labs Lab  12/05/16 1945 12/08/16 1555 12/09/16 0455 12/11/16 0526  WBC 10.4 15.3* 10.6* 8.5  HGB 12.3 11.8* 10.3* 10.8*  HCT 39.1 37.8 33.0* 35.1*  MCV 71.2* 70.3* 71.1* 71.9*  PLT 538* 597* 448* 734*   Basic Metabolic Panel:  Recent Labs Lab 12/05/16 1945 12/08/16 1555 12/09/16 0455 12/11/16 0526  NA 133* 139 138 141  K 3.9 3.7 3.4* 3.6  CL 103 110 112* 111  CO2 21* 22 20* 23  GLUCOSE 105* 98 92 100*  BUN 15 19 16 14   CREATININE 0.77 0.79 0.73 0.81  CALCIUM 9.1 8.9 8.3* 8.9   GFR: Estimated Creatinine Clearance: 118.2 mL/min (by C-G formula based on SCr of 0.81 mg/dL).  Cardiac Enzymes:  Recent Labs Lab 12/08/16 1555 12/09/16 0034 12/09/16 0455  CKTOTAL 41  --   --   TROPONINI  --  <0.03 <0.03   CBG:  Recent Labs Lab 12/08/16 1540 12/08/16 1859  GLUCAP 79 80   Thyroid Function Tests:  Recent Labs  12/09/16 0455  TSH 2.646   Anemia Panel:  Recent Labs  12/09/16 0820  VITAMINB12 337   Urine analysis:    Component Value Date/Time   COLORURINE YELLOW 12/08/2016 1538   APPEARANCEUR HAZY (A) 12/08/2016 1538   LABSPEC 1.019 12/08/2016 1538   PHURINE 5.0 12/08/2016 1538   GLUCOSEU NEGATIVE 12/08/2016 1538   HGBUR MODERATE (A) 12/08/2016 1538   BILIRUBINUR NEGATIVE 12/08/2016 1538   KETONESUR NEGATIVE 12/08/2016 1538   PROTEINUR NEGATIVE 12/08/2016 1538   UROBILINOGEN 1.0 06/09/2014 1714   NITRITE NEGATIVE 12/08/2016 1538   LEUKOCYTESUR NEGATIVE 12/08/2016 1538    Radiology Studies: Ct Angio Chest Pe W Or Wo Contrast  Result Date: 12/10/2016 CLINICAL DATA:  36 year old female with a history of vertigo and shortness of breath. EXAM: CT ANGIOGRAPHY CHEST WITH CONTRAST TECHNIQUE: Multidetector CT imaging of the chest was performed using the standard protocol during bolus administration of intravenous contrast. Multiplanar CT image reconstructions and MIPs were obtained to evaluate the vascular anatomy. CONTRAST:  100 cc Isovue 370 COMPARISON:  No prior CT  chest FINDINGS: Cardiovascular: Heart: No cardiomegaly. No pericardial fluid/thickening. No significant coronary calcifications. Aorta: Unremarkable course, caliber, contour of the thoracic aorta. No aneurysm or dissection flap. No periaortic fluid. Pulmonary arteries: Timing of the contrast bolus somewhat limits evaluation of the distal pulmonary arteries, however, there are no central, lobar, segmental or proximal subsegmental filling defects identified. Of note, the pulmonary artery diameters arch generous at the outer third of the lung, larger and then the accompanying bronchi. Diameter of the main pulmonary artery measures 2.9 cm. Mediastinum/Nodes: Mediastinal lymph nodes are present, none of which are enlarged by CT size criteria. Unremarkable appearance of the thoracic esophagus. Unremarkable appearance of the thoracic inlet and thyroid. Lungs/Pleura: Central airways are clear. No pleural effusion. No confluent left-sided airspace disease. No pneumothorax. Upper Abdomen: Cholecystectomy Musculoskeletal: No displaced fracture. Degenerative changes of the spine. Review of the MIP images confirms the above findings. IMPRESSION: Study is negative for pulmonary emboli. There is subtle enlargement of the bilateral distal pulmonary arteries as compared to the accompanying bronchi at the outer 1/3 of the lungs. This can be seen with changes of early pulmonary hypertension, and referral for pulmonary evaluation may be considered. Electronically Signed   By: Corrie Mckusick D.O.   On: 12/10/2016 14:03      Scheduled Meds: . enoxaparin (LOVENOX) injection  40 mg Subcutaneous Daily  . meclizine  25 mg Oral TID  . metoprolol tartrate  25 mg Oral BID  . multivitamin-iron-minerals-folic acid  1 tablet Oral Daily  . pantoprazole  40 mg Oral Q1200  . topiramate  200 mg Oral BID  . vitamin B-12  1,000 mcg Oral Daily   Continuous Infusions: . sodium chloride 75 mL/hr at 12/11/16 1155     LOS: 1 day    Time  spent in minutes: 25 minutes    Barton Dubois, MD [facility] Pager: [phone] [email or web address] Password [email or web address] 12/11/2016, 6:01 PM

## 2016-12-11 NOTE — Consult Note (Signed)
Patient ID: Tanya [name] MRN: [id number], DOB/AGE: Nov 26, 1980   Admit date: 12/08/2016   Reason for Consult: SVT Requesting MD: Dr. Dyann Kief, Internal Medicine    Primary Physician: Reginia Forts, MD Primary Cardiologist: New (Dr. Acie Fredrickson)  Pt. Profile:  36 y/o female with no prior cardiac history. PMH is notable for migraine headaches, depression, anxiety, anemia, menorrhagia, arthritis and morbid obesity. She presented to the [facility] ED on 12/08/16 with positional dizziness and tachycardia. Found to be orthostatic and admitted for IV hydration. She has had issues with  PSVT, for which cardiology has been consulted.   Problem List  Past Medical History:  Diagnosis Date  . Anemia   . Anxiety   . Arthritis    L knee OA  . Depression    history of depression in 12/13/10 with death of mother  . Migraine     Past Surgical History:  Procedure Laterality Date  . CESAREAN SECTION    . CHOLECYSTECTOMY    . HAND SURGERY    . KNEE SURGERY    . LAPAROSCOPIC UNILATERAL SALPINGECTOMY Left 06/10/2014   Procedure: LAPAROSCOPIC Left SALPINGECTOMY, removal of ectopic pregnancy;  Surgeon: Guss Bunde, MD;  Location: [facility];  Service: Gynecology;  Laterality: Left;  . TUBAL LIGATION    . WISDOM TOOTH EXTRACTION       Allergies  Allergies  Allergen Reactions  . Imitrex [Sumatriptan] Shortness Of Breath  . Zomig [Zolmitriptan] Shortness Of Breath  . Latex Hives and Swelling  . Sulfa Antibiotics Other (See Comments)    Unknown. Since childhood.     HPI  36 y/o female with no prior cardiac history. PMH is notable for migraine headaches, depression, anxiety, anemia, arthritis, menorrhagia and morbid obesity. She presented to the [facility] ED on 12/08/16 with positional dizziness and tachycardia. Found to be orthostatic and admitted for IV hydration. She has had issues with  PSVT, for which cardiology has been consulted.   Pt notes that her symptoms started last Friday on 12/05/16. She has never had an  issue like this before. She has had severe dizziness, tachy palpitations, intermittent visual disturbances bilaterally (black spots), intermittent SSCP described as pressure and near syncope. She denies frank syncope. Symptoms are worse with positional changes. When she goes from lying/sitting>>>standing, her heart rate increases, she has palpitations and feels like she is going to pass out. She denies any recent vomiting or diarrhea. She reports normal PO intake prior to development of symptoms. No excess caffeine. She notes h/o heavy periods. Her LMP was last week. She does not take supplemental Fe. No known heart disease in any first degree relatives.   She was admitted by IM. Brain MRI was negative. Vestibular eval not reporting vertigo and patient symptoms not improving with meclizine. PE has been ruled out by CT scan. Scan was also negative for any abnormal cardiac findings. No cardiomegaly. No pericardiac fluid/thicking and no significant coronary calcifications. No aortic pathology. There was noted however to be subtle enlargement of the bilateral distal pulmonary arteries as compared to the accompanying bronchi at the outer 1/3 of the lungs. 2D echo has been completed with results pending.This can be seen with changes of early pulmonary hypertension. CBC shows chronic anemia. Her Hgb on admit was 12.3, now at 10.8. SCr/BUN has remained normal, at 0.81/14 respectively today. Electrolytes WNL today. B12 WNL.  Troponins are negative x 3. EKG on 12/08/16 showed sinus tach with rate of 100 bpm. No ischemia. On telemetry, she has been observed  to have PSVT with rates as high as the 160s. She states her BPs have been higher than normal as well. Her on prescription medications are Topamax for migraine headaches as well as OCPs.      Home Medications  Prior to Admission medications   Medication Sig Start Date End Date Taking? Authorizing Provider  acetaminophen (TYLENOL) 500 MG tablet Take 1,000 mg by mouth  every 6 (six) hours as needed.   Yes Historical Provider, MD  flintstones complete (FLINTSTONES) 60 MG chewable tablet Chew 2 tablets by mouth daily.   Yes Historical Provider, MD  topiramate (TOPAMAX) 100 MG tablet Take 200 mg by mouth 2 (two) times daily. 08/20/15  Yes Historical Provider, MD  Norethindrone Acetate-Ethinyl Estradiol (JUNEL,LOESTRIN,MICROGESTIN) 1.5-30 MG-MCG tablet Take 1 tablet by mouth daily. Patient not taking: Reported on 12/05/2016 11/06/16   Wardell [place], [place]  . enoxaparin (LOVENOX) injection  40 mg Subcutaneous Daily  . meclizine  25 mg Oral TID  . multivitamin-iron-minerals-folic acid  1 tablet Oral Daily  . pantoprazole  40 mg Oral Q1200  . topiramate  200 mg Oral BID  . vitamin B-12  1,000 mcg Oral Daily    Family History  Family History  Problem Relation Age of Onset  . Breast cancer Mother 17  . Cancer Mother   . Prostate cancer Father 96  . Breast cancer Maternal Aunt     dx in her 62s; BRCA neg  . Prostate cancer Paternal Uncle   . Prostate cancer Paternal Grandfather   . Ovarian cancer Sister 40    found during her pregnancy  . Prostate cancer Paternal Uncle   . Prostate cancer Paternal Uncle   . Cancer Other   . Stroke Other   . Diabetes Other   . Coronary artery disease Other     Social History  Social History   Social History  . Marital status: Married    Spouse name: N/A  . Number of children: 2  . Years of education: N/A   Occupational History  . nurse tech [place]   Social History Main Topics  . Smoking status: Never Smoker  . Smokeless tobacco: Never Used  . Alcohol use Yes     Comment: socially  . Drug use: No  . Sexual activity: Not on file   Other Topics Concern  . Not on file   Social History Narrative   Marital status: married x 6 years; second marriage      Children: 2 children (20, 57); 1 stepgrandaughter; 1 stepdaughter      Lives: with husband, one son      Employment: Fish farm manager since 2017; fifth floor surgical floor; third shift 3 twelve hour shifts      Tobacco: none      Alcohol: none      Drugs; none      Exercise: none     Review of Systems General:  No chills, fever, night sweats or weight changes.  Cardiovascular:  No chest pain, dyspnea on exertion, edema, orthopnea, palpitations, paroxysmal nocturnal dyspnea. Dermatological: No rash, lesions/masses Respiratory: No cough, dyspnea Urologic: No hematuria, dysuria Abdominal:   No nausea, vomiting, diarrhea, bright red blood per rectum, melena, or hematemesis Neurologic:  No visual changes, wkns, changes in mental status. All other systems reviewed and are otherwise negative except as noted above.  Physical Exam  Blood pressure 124/79, pulse 81, temperature 97.9 F (36.6 C), temperature source Oral, resp.  rate 18, height '5\' 1"'  (1.549 m), weight 280 lb (127 kg), last menstrual period 12/04/2016, SpO2 98 %.  General: Pleasant, NAD Psych: Normal affect. Neuro: Alert and oriented X 3. Moves all extremities spontaneously. HEENT: Normal  Neck: Supple without bruits or JVD. Lungs:  Resp regular and unlabored, CTA. Heart: RRR no s3, s4, or murmurs. Abdomen: Soft, non-tender, non-distended, BS + x 4.  Extremities: No clubbing, cyanosis or edema. DP/PT/Radials 2+ and equal bilaterally.  Labs  Troponin (Point of Care Test) No results for input(s): TROPIPOC in the last 72 hours.  Recent Labs  12/08/16 1555 12/09/16 0034 12/09/16 0455  CKTOTAL 41  --   --   TROPONINI  --  <0.03 <0.03   Lab Results  Component Value Date   WBC 8.5 12/11/2016   HGB 10.8 (L) 12/11/2016   HCT 35.1 (L) 12/11/2016   MCV 71.9 (L) 12/11/2016   PLT 477 (H) 12/11/2016    Recent Labs Lab 12/11/16 0526  NA 141  K 3.6  CL 111  CO2 23  BUN 14  CREATININE 0.81  CALCIUM 8.9  GLUCOSE 100*   Lab Results  Component Value Date   CHOL (H) 10/05/2007    235        ATP III CLASSIFICATION:  <200     mg/dL    Desirable  200-239  mg/dL   Borderline High  >=240    mg/dL   High   HDL 33 (L) 10/05/2007   LDLCALC (H) 10/05/2007    171        Total Cholesterol/HDL:CHD Risk Coronary Heart Disease Risk Table                     Men   Women  1/2 Average Risk   3.4   3.3   TRIG 153 (H) 10/05/2007   Lab Results  Component Value Date   DDIMER 0.37 12/09/2016     Radiology/Studies  Dg Chest 2 View  Result Date: 11/21/2016 CLINICAL DATA:  Preop bariatric surgery. EXAM: CHEST  2 VIEW COMPARISON:  06/27/2012 FINDINGS: The heart size and mediastinal contours are within normal limits. Both lungs are clear. The visualized skeletal structures are unremarkable. IMPRESSION: No active cardiopulmonary disease. Electronically Signed   By: Rolm Baptise M.D.   On: 11/21/2016 09:12   Ct Angio Chest Pe W Or Wo Contrast  Result Date: 12/10/2016 CLINICAL DATA:  36 year old female with a history of vertigo and shortness of breath. EXAM: CT ANGIOGRAPHY CHEST WITH CONTRAST TECHNIQUE: Multidetector CT imaging of the chest was performed using the standard protocol during bolus administration of intravenous contrast. Multiplanar CT image reconstructions and MIPs were obtained to evaluate the vascular anatomy. CONTRAST:  100 cc Isovue 370 COMPARISON:  No prior CT chest FINDINGS: Cardiovascular: Heart: No cardiomegaly. No pericardial fluid/thickening. No significant coronary calcifications. Aorta: Unremarkable course, caliber, contour of the thoracic aorta. No aneurysm or dissection flap. No periaortic fluid. Pulmonary arteries: Timing of the contrast bolus somewhat limits evaluation of the distal pulmonary arteries, however, there are no central, lobar, segmental or proximal subsegmental filling defects identified. Of note, the pulmonary artery diameters arch generous at the outer third of the lung, larger and then the accompanying bronchi. Diameter of the main pulmonary artery measures 2.9 cm. Mediastinum/Nodes: Mediastinal lymph  nodes are present, none of which are enlarged by CT size criteria. Unremarkable appearance of the thoracic esophagus. Unremarkable appearance of the thoracic inlet and thyroid. Lungs/Pleura: Central airways are clear. No pleural  effusion. No confluent left-sided airspace disease. No pneumothorax. Upper Abdomen: Cholecystectomy Musculoskeletal: No displaced fracture. Degenerative changes of the spine. Review of the MIP images confirms the above findings. IMPRESSION: Study is negative for pulmonary emboli. There is subtle enlargement of the bilateral distal pulmonary arteries as compared to the accompanying bronchi at the outer 1/3 of the lungs. This can be seen with changes of early pulmonary hypertension, and referral for pulmonary evaluation may be considered. Electronically Signed   By: Corrie Mckusick D.O.   On: 12/10/2016 14:03   Mr Brain Wo Contrast  Result Date: 12/08/2016 CLINICAL DATA:  36 y/o  F; recurrent headaches. EXAM: MRI HEAD WITHOUT CONTRAST TECHNIQUE: Multiplanar, multiecho pulse sequences of the brain and surrounding structures were obtained without intravenous contrast. COMPARISON:  06/04/2014 CT head.  10/05/2007 MRI of the brain. FINDINGS: Brain: No acute infarction, hemorrhage, hydrocephalus, extra-axial collection or mass lesion. Punctate foci of T2 FLAIR hyperintensity in right frontal subcortical white matter and right posterior temporal subcortical white matter. Vascular: Normal flow voids. Skull and upper cervical spine: Normal marrow signal. Sinuses/Orbits: Mild maxillary sinus mucosal thickening. No abnormal signal of mastoid air cells. Orbits are unremarkable. Other: None. IMPRESSION: 1. No acute intracranial abnormality. 2. Two punctate T2 FLAIR hyperintense foci in white matter of unlikely clinical significance, possibly migraine headache associated. 3. Mild maxillary sinus mucosal thickening. Electronically Signed   By: Kristine Garbe M.D.   On: 12/08/2016 20:53   Mr  Lumbar Spine W Wo Contrast  Result Date: 12/09/2016 CLINICAL DATA:  Dizziness. Bilateral lower extremity weakness and pain. Numbness in both feet. EXAM: MRI LUMBAR SPINE WITHOUT AND WITH CONTRAST TECHNIQUE: Multiplanar and multiecho pulse sequences of the lumbar spine were obtained without and with intravenous contrast. CONTRAST:  20 ml MULTIHANCE GADOBENATE DIMEGLUMINE 529 MG/ML IV SOLN COMPARISON:  CT abdomen and pelvis 06/10/2014. FINDINGS: Segmentation:  Standard. Alignment:  Maintained. Vertebrae: Height and signal are normal. Hemangioma in the posterior left ilium is incidentally noted. Conus medullaris: Extends to the L1-2: Level and appears normal. Paraspinal and other soft tissues: There is some subcutaneous edema in the fatty soft tissues of the back likely due to dependent change. Imaged intra-abdominal contents appear normal. Disc levels: The T12-L1 to L4-5 levels are normal. L5-S1: Very shallow central protrusion. The central canal and foramina are widely patent. IMPRESSION: Shallow central protrusion L5-S1 without central canal or foraminal stenosis. The lumbar spine is otherwise normal in appearance. No finding to explain the patient's symptoms. Electronically Signed   By: Inge Rise M.D.   On: 12/09/2016 09:22   Dg Duanne Limerick  W/kub  Result Date: 11/21/2016 CLINICAL DATA:  Morbid obesity.  Preoperative anatomic evaluation. EXAM: UPPER GI SERIES WITH KUB TECHNIQUE: After obtaining a scout radiograph a routine upper GI series was performed using thin barium FLUOROSCOPY TIME:  Fluoroscopy Time:  1 minutes Radiation Exposure Index (if provided by the fluoroscopic device): 16.1 mGy Number of Acquired Spot Images: 3 COMPARISON:  None. FINDINGS: Abbreviated exam performed in this young patient with this indication. Oblique pharyngeal imaging is normal.  No obstruction or aspiration. Normal distensibility and motility of the esophagus. No stricture or reflux noted. No evidence of mucosal lesion. Normal  shape and distensibility of the stomach. No hiatal hernia. No evidence of fold thickening. Normal duodenal morphology and fold pattern. IMPRESSION: Normal upper GI. Electronically Signed   By: Monte Fantasia M.D.   On: 11/21/2016 09:49    ECG  EKG on 12/08/16 showed sinus tach with rate of  100 bpm. No ischemia. - personally reviewed  Telemetry   NSR currently, PSVT noted with rates as high as the 160s. - personally reviewed    Echocardiogram - pending   ASSESSMENT AND PLAN  Principal Problem:   Dizziness Active Problems:   Morbid obesity (HCC)   Migraine   Anemia   Leukocytosis   Numbness in feet   Tachycardia   Thrombocytosis (HCC)   Chest discomfort   SOB (shortness of breath)   SVT (supraventricular tachycardia) ([place])   Tachypnea   36 y/o female with no prior cardiac history. PMH is notable for migraine headaches, depression, anxiety, anemia, arthritis and morbid obesity. She presented to the [facility] ED on 12/08/16 with positional dizziness and tachycardia. Found to be orthostatic and admitted for IV hydration. She has had issues with  PSVT, with rates as high as the 160s, for which cardiology has been consulted.    1. PSVT/ Sinus Tachycardia : PE ruled out by CT scan. Cyclic troponins are negative. TSH is normal. She has chronic anemia 2/2 menorrhagia, however doubt the degree is  severe encough to cause her recent symptoms. Hgb is 10.8. 2D echo pending. We will review echo to assess for the presence of structural heart disease. She continues to have symptoms despite hydration with IVFs.  SCr/BUN are both WNL. Recommend addition of a BB for rate control. We will add metoprolol 25 mg BID. We also encouraged weight loss as obesity may cause a higher degree of exertional tachycardia. She may also be a risk for OSA. We recommend that she undergo w/u for this. If she continues to have breakthrough PSVT while on BB, we will need to refer to EP for EP study/ SVT ablation.    Signed, Lyda Jester, PA-C 12/11/2016, 1:45 PM   Attending Note:   The patient was seen and examined.  Agree with assessment and plan as noted above.  Changes made to the above note as needed.  Patient seen and independently examined with Lyda Jester, PA .   We discussed all aspects of the encounter. I agree with the assessment and plan as stated above.  1. SVT :   The patient had a documented episode of what looks like SVT this am I cannot completely rule out very fast sinus tach.  Will start metoprolol If she continues to have these episodes, she may need an ablation  2. Sinus tach:   Most of her episodes are due to sinus tach. This is multifactorial - morbid obesity, deconditioning, probable sleep apnea, poor PO appetite recently .  I've discussed the importance of weight loss with her. She will need to lose weight or else she will have ongoing problems going forward.  She has dizziness that is related to exertion.   This may just be an issue with obesity  3. Possible pulmonary HTN:   Echo pending . The CT showed enlarged Pulmonary arteries. This could be caused by OSA or from obesity hypoventilation  - stressed weight loss to her.  Needs sleep study    I have spent a total of 40 minutes with patient reviewing hospital  notes , telemetry, EKGs, labs and examining patient as well as establishing an assessment and plan that was discussed with the patient. > 50% of time was spent in direct patient care.    Thayer Headings, Brooke Bonito., MD, [facility] 12/11/2016, 3:05 PM [address]  [place] Pager [phone]

## 2016-12-12 DIAGNOSIS — D519 Vitamin B12 deficiency anemia, unspecified: Secondary | ICD-10-CM

## 2016-12-12 MED ORDER — METOPROLOL TARTRATE 25 MG PO TABS: 25.0000 mg | ORAL_TABLET | Freq: Two times a day (BID) | ORAL | 1 refills | Status: DC

## 2016-12-12 MED ORDER — MECLIZINE HCL 25 MG PO TABS: 25.0000 mg | ORAL_TABLET | Freq: Three times a day (TID) | ORAL | 0 refills | Status: DC | PRN

## 2016-12-12 MED ORDER — PROMETHAZINE HCL 25 MG/ML IJ SOLN
12.5000 mg | Freq: Once | INTRAMUSCULAR | Status: AC
  Administered 2016-12-12: 12.5 mg via INTRAVENOUS
  Filled 2016-12-12: qty 1

## 2016-12-12 MED ORDER — PANTOPRAZOLE SODIUM 40 MG PO TBEC: 40.0000 mg | DELAYED_RELEASE_TABLET | Freq: Every day | ORAL | 1 refills | Status: DC

## 2016-12-12 MED ORDER — BISACODYL 5 MG PO TBEC
10.0000 mg | DELAYED_RELEASE_TABLET | Freq: Once | ORAL | Status: AC
  Administered 2016-12-12: 10 mg via ORAL
  Filled 2016-12-12: qty 2

## 2016-12-12 MED ORDER — CYANOCOBALAMIN 1000 MCG PO TABS: 1000.0000 ug | ORAL_TABLET | Freq: Every day | ORAL | 1 refills | Status: DC

## 2016-12-12 NOTE — Discharge Summary (Signed)
Physician Discharge Summary  Tanya [name] RKY:[id number] DOB: [birth year] DOA: 12/08/2016  PCP: Reginia Forts, MD  Admit date: 12/08/2016 Discharge date: 12/12/2016  Time spent: 35 minutes  Recommendations for Outpatient Follow-up:  1. Repeat BMET to follow electrolytes and renal function  2. Please arrange sleep study for patient 3. Reassess HR and titrate metoprolol as needed for better rate control; if needed patient can follow up with Dr. Acie Fredrickson as an outpatient. 4. Needs imperative weight loss  Discharge Diagnoses:  Principal Problem:   Vertigo Active Problems:   Morbid obesity (HCC)   Migraine   Anemia due to vitamin B12 deficiency   Leukocytosis   Numbness in feet   Tachycardia   Thrombocytosis (HCC)   Chest discomfort   SOB (shortness of breath)   SVT (supraventricular tachycardia) ([place])   Tachypnea   Discharge Condition: stable and improved. Discharge home with instructions to follow up with PCP in 2 weeks.  Diet recommendation: low sodium and low calorie diet   Filed Weights   12/08/16 1535 12/12/16 0414  Weight: 127 kg (280 lb) 129 kg (284 lb 6.4 oz)    History of present illness:  Tanya [name] a 36 y.o.femalewith medical history significant of migraine headaches, depression, anxiety, anemia, arthritis, morbid obesity, who presents with dizziness palpitation,numbness in both feet and bilateral leg pain.  Pt states that her dizziness started suddenly after taking shower 4 days ago. Per pt, dizziness is exacerbated by position change of both her body and head. She sees spots in both eyes, but no vision loss. She has mild headache, but no neck rigidity. No ear ringing or hearing loss. She states that started having numbness in both feet and bilateral whole leg pain and mild leg weakness since yesterday. Noslurred speech or facial droop. She stated that she had back injury 6 month ago and has intermittent lower back pain.   She also reports palpitation,  but denies chest pain, SOB, fever, chills, cough. She has nausea, no vomiting, diarrhea or abdominal pain. Patient states that she took Norethindrone Acetate-Ethinyl Estradiol pill recently due to heavy menstrual period, but stopped taking it at the end of February. Pt denies symptoms of UTI. Norecent viral infection. She states that she dose not have hx of hypertension, but had one episode of elevated blood pressure up to 200/105 yesterday.   Hospital Course:  Vertigo with tachycardia (SVT) on standing, chest discomfort and tachypnea/SOB  - due to use of Oral contraceptives D-dimer checked and neg; given continue symptoms  performed CTA, which was neg for PE. -2-D echo ordered (to further evaluate cause for SVT); normal EF, no wall motion abnormalities; grade 1 diastolic HF. -neg troponin -MRI brain checked and was normal -Vestibular eval not reporting vertigo and patient symptoms not improving with meclizine -no longer orthostatic  -encourage to maintain adequate hydration  -per cardiology rec's started on metoprolol for SVT; will need further titration as needed base on HR fluctuation. Significant improvement in her symptoms with initiation of metoprolol.  Orthostatic hypotension: on admission  -resolved with IVF's -repeat orthostatic changes neg at discharge  Numbness in feet -Lumbar MRI and brain MRI unrevealing -check B12 level - low normal- give s/c replacement for 2 days and start daily oral B12  Leukocytosis and thrombocytosis -likely due to hemoconcentration - improving with IVF -no source of infection seen  Pain in legs -Tylenol QID as needed for pain -B12 repletion initiated -electrolytes WNL at discharge    Morbid obesity  Body mass index is  52.91 kg/m. -low calorie diet and exercise discussed with patient -most likely have component of OSA/OHS; will benefit of outpatient sleep apnea test    Migraine - no headache at this time -continue topamax      Anemia: with component of low normal B12 and low MCV - Hb chronically 10-11 (patient with heavy menstrual periods) -will discharge on MV with iron  -started on B12 repletion as well    Chronic diastolic HF -low sodium diet and weight loss recommended  -started on low dose metoprolol BID -instructed to check daily weights    Procedures:   see below for x-ray reports  CTA: neg for PE. Positive enlargement of pulmonary arteries, suggesting pulmonary HTN  2-D echo - Left ventricle: The cavity size was normal. Wall thickness was increased in a pattern of mild LVH. Systolic function was normal. The estimated ejection fraction was in the range of 60% to 65%. Wall motion was normal; there were no regional wall motion abnormalities. Doppler parameters are consistent with high ventricular filling pressure.  Impressions: - Normal LV systolic function; mild LVH; grade 1 diastolic function.  Consultations:  Cardiology   Discharge Exam: Vitals:   12/12/16 0223 12/12/16 0414  BP: (!) 135/98 121/83  Pulse: 75 69  Resp:  18  Temp: 97.6 F (36.4 C) 97.6 F (36.4 C)    General exam: Appears ok today; less uncomfortable overall, but still reporting chest discomfort, SOB and tachypnea (especially with exertion). Patient also with SVT appreciated on telemetry once again today while ambulating to restroom. HEENT: PERRLA, oral mucosa moist, no sclera icterus or thrush Respiratory system: Clear to auscultation. Patient was Tachypneic and seen to have slightly labour breathing Cardiovascular system: S1 & S2 heard, tachycardic.  No murmurs, no rubs  Gastrointestinal system: Abdomen soft, non-tender, nondistended. Normal bowel sound. No organomegaly Central nervous system: Alert and oriented. No focal neurological deficits.- subjective numbness in b/l feet Extremities: No cyanosis, clubbing or edema- calves/thighs are tender to touch (but improved overall) Skin: No rashes or  ulcers  Discharge Instructions   Discharge Instructions    AMB Referral to [facility]    Complete by:  As directed    Please assign [employer] member for post discharge call. Currently at [facility]. Please see liaison notes. Thanks. Marthenia Rolling, [place], [facility] [phone]   Reason for consult:  Please assign UMR member for post discharge call   Expected date of contact:  1-3 days (reserved for hospital discharges)   Discharge instructions    Complete by:  As directed    Maintain adequate hydration  Follow low calorie diet and increase physical activity Watch your sodium intake and limited to less than 2.5 gram daily  Arrange follow up with PCP in 2 weeks  Take medications as prescribed     Current Discharge Medication List    START taking these medications   Details  meclizine (ANTIVERT) 25 MG tablet Take 1 tablet (25 mg total) by mouth 3 (three) times daily as needed for dizziness. Qty: 30 tablet, Refills: 0    metoprolol tartrate (LOPRESSOR) 25 MG tablet Take 1 tablet (25 mg total) by mouth 2 (two) times daily. Qty: 60 tablet, Refills: 1    pantoprazole (PROTONIX) 40 MG tablet Take 1 tablet (40 mg total) by mouth daily at 12 noon. Qty: 30 tablet, Refills: 1    vitamin B-12 1000 MCG tablet Take 1 tablet (1,000 mcg total) by mouth daily. Qty: 30 tablet, Refills: 1  CONTINUE these medications which have NOT CHANGED   Details  acetaminophen (TYLENOL) 500 MG tablet Take 1,000 mg by mouth every 6 (six) hours as needed.    flintstones complete (FLINTSTONES) 60 MG chewable tablet Chew 2 tablets by mouth daily.    topiramate (TOPAMAX) 100 MG tablet Take 200 mg by mouth 2 (two) times daily.    Norethindrone Acetate-Ethinyl Estradiol (JUNEL,LOESTRIN,MICROGESTIN) 1.5-30 MG-MCG tablet Take 1 tablet by mouth daily. Qty: 1 Package, Refills: 11       Allergies  Allergen Reactions  . Imitrex [Sumatriptan] Shortness Of Breath  . Zomig  [Zolmitriptan] Shortness Of Breath  . Latex Hives and Swelling  . Sulfa Antibiotics Other (See Comments)    Unknown. Since childhood.    Follow-up Information    SMITH,KRISTI, MD. Schedule an appointment as soon as possible for a visit in 2 week(s).   Specialty:  Family Medicine Contact information: [place] [address] [phone]           The results of significant diagnostics from this hospitalization (including imaging, microbiology, ancillary and laboratory) are listed below for reference.    Significant Diagnostic Studies: Dg Chest 2 View  Result Date: 11/21/2016 CLINICAL DATA:  Preop bariatric surgery. EXAM: CHEST  2 VIEW COMPARISON:  06/27/2012 FINDINGS: The heart size and mediastinal contours are within normal limits. Both lungs are clear. The visualized skeletal structures are unremarkable. IMPRESSION: No active cardiopulmonary disease. Electronically Signed   By: Rolm Baptise M.D.   On: 11/21/2016 09:12   Ct Angio Chest Pe W Or Wo Contrast  Result Date: 12/10/2016 CLINICAL DATA:  36 year old female with a history of vertigo and shortness of breath. EXAM: CT ANGIOGRAPHY CHEST WITH CONTRAST TECHNIQUE: Multidetector CT imaging of the chest was performed using the standard protocol during bolus administration of intravenous contrast. Multiplanar CT image reconstructions and MIPs were obtained to evaluate the vascular anatomy. CONTRAST:  100 cc Isovue 370 COMPARISON:  No prior CT chest FINDINGS: Cardiovascular: Heart: No cardiomegaly. No pericardial fluid/thickening. No significant coronary calcifications. Aorta: Unremarkable course, caliber, contour of the thoracic aorta. No aneurysm or dissection flap. No periaortic fluid. Pulmonary arteries: Timing of the contrast bolus somewhat limits evaluation of the distal pulmonary arteries, however, there are no central, lobar, segmental or proximal subsegmental filling defects identified. Of note, the pulmonary artery  diameters arch generous at the outer third of the lung, larger and then the accompanying bronchi. Diameter of the main pulmonary artery measures 2.9 cm. Mediastinum/Nodes: Mediastinal lymph nodes are present, none of which are enlarged by CT size criteria. Unremarkable appearance of the thoracic esophagus. Unremarkable appearance of the thoracic inlet and thyroid. Lungs/Pleura: Central airways are clear. No pleural effusion. No confluent left-sided airspace disease. No pneumothorax. Upper Abdomen: Cholecystectomy Musculoskeletal: No displaced fracture. Degenerative changes of the spine. Review of the MIP images confirms the above findings. IMPRESSION: Study is negative for pulmonary emboli. There is subtle enlargement of the bilateral distal pulmonary arteries as compared to the accompanying bronchi at the outer 1/3 of the lungs. This can be seen with changes of early pulmonary hypertension, and referral for pulmonary evaluation may be considered. Electronically Signed   By: Corrie Mckusick D.O.   On: 12/10/2016 14:03   Mr Brain Wo Contrast  Result Date: 12/08/2016 CLINICAL DATA:  36 y/o  F; recurrent headaches. EXAM: MRI HEAD WITHOUT CONTRAST TECHNIQUE: Multiplanar, multiecho pulse sequences of the brain and surrounding structures were obtained without intravenous contrast. COMPARISON:  06/04/2014 CT head.  10/05/2007  MRI of the brain. FINDINGS: Brain: No acute infarction, hemorrhage, hydrocephalus, extra-axial collection or mass lesion. Punctate foci of T2 FLAIR hyperintensity in right frontal subcortical white matter and right posterior temporal subcortical white matter. Vascular: Normal flow voids. Skull and upper cervical spine: Normal marrow signal. Sinuses/Orbits: Mild maxillary sinus mucosal thickening. No abnormal signal of mastoid air cells. Orbits are unremarkable. Other: None. IMPRESSION: 1. No acute intracranial abnormality. 2. Two punctate T2 FLAIR hyperintense foci in white matter of unlikely  clinical significance, possibly migraine headache associated. 3. Mild maxillary sinus mucosal thickening. Electronically Signed   By: Kristine Garbe M.D.   On: 12/08/2016 20:53   Mr Lumbar Spine W Wo Contrast  Result Date: 12/09/2016 CLINICAL DATA:  Dizziness. Bilateral lower extremity weakness and pain. Numbness in both feet. EXAM: MRI LUMBAR SPINE WITHOUT AND WITH CONTRAST TECHNIQUE: Multiplanar and multiecho pulse sequences of the lumbar spine were obtained without and with intravenous contrast. CONTRAST:  20 ml MULTIHANCE GADOBENATE DIMEGLUMINE 529 MG/ML IV SOLN COMPARISON:  CT abdomen and pelvis 06/10/2014. FINDINGS: Segmentation:  Standard. Alignment:  Maintained. Vertebrae: Height and signal are normal. Hemangioma in the posterior left ilium is incidentally noted. Conus medullaris: Extends to the L1-2: Level and appears normal. Paraspinal and other soft tissues: There is some subcutaneous edema in the fatty soft tissues of the back likely due to dependent change. Imaged intra-abdominal contents appear normal. Disc levels: The T12-L1 to L4-5 levels are normal. L5-S1: Very shallow central protrusion. The central canal and foramina are widely patent. IMPRESSION: Shallow central protrusion L5-S1 without central canal or foraminal stenosis. The lumbar spine is otherwise normal in appearance. No finding to explain the patient's symptoms. Electronically Signed   By: Inge Rise M.D.   On: 12/09/2016 09:22   Dg Duanne Limerick  W/kub  Result Date: 11/21/2016 CLINICAL DATA:  Morbid obesity.  Preoperative anatomic evaluation. EXAM: UPPER GI SERIES WITH KUB TECHNIQUE: After obtaining a scout radiograph a routine upper GI series was performed using thin barium FLUOROSCOPY TIME:  Fluoroscopy Time:  1 minutes Radiation Exposure Index (if provided by the fluoroscopic device): 16.1 mGy Number of Acquired Spot Images: 3 COMPARISON:  None. FINDINGS: Abbreviated exam performed in this young patient with this  indication. Oblique pharyngeal imaging is normal.  No obstruction or aspiration. Normal distensibility and motility of the esophagus. No stricture or reflux noted. No evidence of mucosal lesion. Normal shape and distensibility of the stomach. No hiatal hernia. No evidence of fold thickening. Normal duodenal morphology and fold pattern. IMPRESSION: Normal upper GI. Electronically Signed   By: Monte Fantasia M.D.   On: 11/21/2016 09:49    Microbiology: No results found for this or any previous visit (from the past 240 hour(s)).   Labs: Basic Metabolic Panel:  Recent Labs Lab 12/05/16 1945 12/08/16 1555 12/09/16 0455 12/11/16 0526  NA 133* 139 138 141  K 3.9 3.7 3.4* 3.6  CL 103 110 112* 111  CO2 21* 22 20* 23  GLUCOSE 105* 98 92 100*  BUN 15 19 16 14   CREATININE 0.77 0.79 0.73 0.81  CALCIUM 9.1 8.9 8.3* 8.9   CBC:  Recent Labs Lab 12/05/16 1945 12/08/16 1555 12/09/16 0455 12/11/16 0526  WBC 10.4 15.3* 10.6* 8.5  HGB 12.3 11.8* 10.3* 10.8*  HCT 39.1 37.8 33.0* 35.1*  MCV 71.2* 70.3* 71.1* 71.9*  PLT 538* 597* 448* 477*   Cardiac Enzymes:  Recent Labs Lab 12/08/16 1555 12/09/16 0034 12/09/16 0455  CKTOTAL 41  --   --  TROPONINI  --  <0.03 <0.03   CBG:  Recent Labs Lab 12/08/16 1540 12/08/16 1859  GLUCAP 79 80    Signed:  Barton Dubois MD.  [facility] 12/12/2016, 11:10 AM

## 2016-12-12 NOTE — Consult Note (Signed)
   [facility] CM Inpatient Consult   12/12/2016  EMILE RINGGENBERG 1981/08/27 [id number]   Came to visit Mrs. Schoonmaker prior to hospital discharge on behalf of [employer] [facility] Management program for [facility] employees/dependents with [facility] insurance. Spoke with her via phone yesterday. Provided [employer] and contact information. Reminded her that she will receive post hospital discharge call. Appreciative of the visit.    Marthenia Rolling, MSN-Ed, RN,BSN [facility] Liaison [phone]

## 2016-12-12 NOTE — Progress Notes (Addendum)
Progress Note  Patient Name: Tanya [name] Date of Encounter: 12/12/2016  Primary Cardiologist: New, Nahser  Subjective    36 yo with hx of morbid obesity . Admitted with dizziness and tachycardia  We added metoprolol last night HR has slowed  Echo is essentially normal - normal LV systolic function.  Grade 1 diastolic dysfunction  No pulmonary HTN  Inpatient Medications    Scheduled Meds: . enoxaparin (LOVENOX) injection  40 mg Subcutaneous Daily  . meclizine  25 mg Oral TID  . metoprolol tartrate  25 mg Oral BID  . multivitamin-iron-minerals-folic acid  1 tablet Oral Daily  . pantoprazole  40 mg Oral Q1200  . topiramate  200 mg Oral BID  . vitamin B-12  1,000 mcg Oral Daily   Continuous Infusions: . sodium chloride 75 mL/hr at 12/11/16 2357   PRN Meds: acetaminophen, gi cocktail, hydrALAZINE, ondansetron **OR** ondansetron (ZOFRAN) IV, oxyCODONE-acetaminophen   Vital Signs    Vitals:   12/11/16 1500 12/11/16 2103 12/12/16 0223 12/12/16 0414  BP: (!) 153/98 110/73 (!) 135/98 121/83  Pulse: 82 75 75 69  Resp: 18 18  18   Temp: 97.8 F (36.6 C) 97.6 F (36.4 C) 97.6 F (36.4 C) 97.6 F (36.4 C)  TempSrc: Oral Oral Oral Oral  SpO2: 100% 99%  100%  Weight:    284 lb 6.4 oz (129 kg)  Height:        Intake/Output Summary (Last 24 hours) at 12/12/16 0738 Last data filed at 12/12/16 0600  Gross per 24 hour  Intake             2280 ml  Output              850 ml  Net             1430 ml   Filed Weights   12/08/16 1535 12/12/16 0414  Weight: 280 lb (127 kg) 284 lb 6.4 oz (129 kg)    Telemetry    NSR  , sinus tach early this am  - Personally Reviewed  ECG     Physical Exam   GEN: No acute distress.  Morbid obesity  Neck: No JVD Cardiac: RRR, no murmurs, rubs, or gallops.  Respiratory: Clear to auscultation bilaterally. GI: Soft, nontender, non-distended  MS: No edema; No deformity. Neuro:  Nonfocal  Psych: Normal affect   Labs     Chemistry Recent Labs Lab 12/08/16 1555 12/09/16 0455 12/11/16 0526  NA 139 138 141  K 3.7 3.4* 3.6  CL 110 112* 111  CO2 22 20* 23  GLUCOSE 98 92 100*  BUN 19 16 14   CREATININE 0.79 0.73 0.81  CALCIUM 8.9 8.3* 8.9  GFRNONAA >60 >60 >60  GFRAA >60 >60 >60  ANIONGAP 7 6 7      Hematology Recent Labs Lab 12/08/16 1555 12/09/16 0455 12/11/16 0526  WBC 15.3* 10.6* 8.5  RBC 5.38* 4.64 4.88  HGB 11.8* 10.3* 10.8*  HCT 37.8 33.0* 35.1*  MCV 70.3* 71.1* 71.9*  MCH 21.9* 22.2* 22.1*  MCHC 31.2 31.2 30.8  RDW 15.5 15.8* 15.8*  PLT 597* 448* 477*    Cardiac Enzymes Recent Labs Lab 12/09/16 0034 12/09/16 0455  TROPONINI <0.03 <0.03   No results for input(s): TROPIPOC in the last 168 hours.   BNPNo results for input(s): BNP, PROBNP in the last 168 hours.   DDimer  Recent Labs Lab 12/09/16 0027  DDIMER 0.37     Radiology    Ct Angio  Chest Pe W Or Wo Contrast  Result Date: 12/10/2016 CLINICAL DATA:  36 year old female with a history of vertigo and shortness of breath. EXAM: CT ANGIOGRAPHY CHEST WITH CONTRAST TECHNIQUE: Multidetector CT imaging of the chest was performed using the standard protocol during bolus administration of intravenous contrast. Multiplanar CT image reconstructions and MIPs were obtained to evaluate the vascular anatomy. CONTRAST:  100 cc Isovue 370 COMPARISON:  No prior CT chest FINDINGS: Cardiovascular: Heart: No cardiomegaly. No pericardial fluid/thickening. No significant coronary calcifications. Aorta: Unremarkable course, caliber, contour of the thoracic aorta. No aneurysm or dissection flap. No periaortic fluid. Pulmonary arteries: Timing of the contrast bolus somewhat limits evaluation of the distal pulmonary arteries, however, there are no central, lobar, segmental or proximal subsegmental filling defects identified. Of note, the pulmonary artery diameters arch generous at the outer third of the lung, larger and then the accompanying bronchi.  Diameter of the main pulmonary artery measures 2.9 cm. Mediastinum/Nodes: Mediastinal lymph nodes are present, none of which are enlarged by CT size criteria. Unremarkable appearance of the thoracic esophagus. Unremarkable appearance of the thoracic inlet and thyroid. Lungs/Pleura: Central airways are clear. No pleural effusion. No confluent left-sided airspace disease. No pneumothorax. Upper Abdomen: Cholecystectomy Musculoskeletal: No displaced fracture. Degenerative changes of the spine. Review of the MIP images confirms the above findings. IMPRESSION: Study is negative for pulmonary emboli. There is subtle enlargement of the bilateral distal pulmonary arteries as compared to the accompanying bronchi at the outer 1/3 of the lungs. This can be seen with changes of early pulmonary hypertension, and referral for pulmonary evaluation may be considered. Electronically Signed   By: Corrie Mckusick D.O.   On: 12/10/2016 14:03    Cardiac Studies     Patient Profile     36 y.o. female  With dizziness and tachycardia   Assessment & Plan    1. Dizziness / tachycardia:   Has improved dramatically on metoprolol This may all be sinus tach which is secondary to her morbid obesity, general deconditioning Echo is essentially normal  - has grade 1 DD - which is not unusual given her obesity .   Ive advised weight loss and a regular exercise program   She should follow up with her primary MD They should be able to write for the metoprolol If not, we can see her in the office yearly to continue the metoprolol   Will sign off. Call for questions   Signed, Mertie Moores, MD  12/12/2016, 7:38 AM

## 2016-12-16 ENCOUNTER — Other Ambulatory Visit: Payer: Self-pay | Admitting: *Deleted

## 2016-12-16 ENCOUNTER — Encounter: Payer: Self-pay | Admitting: *Deleted

## 2016-12-16 NOTE — Patient Outreach (Addendum)
[place]) Care Management  12/16/2016 Tanya [name] 12-04-1980 [id number]   Subjective:  Telephone call to patient's home / mobile number, spoke with patient, HIPAA verified, and requested RNCM call her back on land line. Telephone call to patient's land line, spoke with patient, and HIPAA verified.   Discussed [facility] Care Management UMR Transition of care follow up, patient voices understanding, and is in agreement to follow up. Patient states she is doing fine, still having fast heart rate same as it has been since hospitalization, and states she is planning to discuss with primary MD's office today, when she calls to schedule hospital follow up appointment.  Discussed the following [employer] employee benefits:  Hospital indemnity supplemental insurance and [facility] outpatient pharmacy.  Patient voices understanding, states she will follow up to transition her medications from local pharmacy, and follow up with [employer] to file claims for hospital indemnity benefit.  Patient verbally given contact number for [facility] [phone]) and [facility] [phone]).    Patient states she is planning to return to work after 12/18/16 and is aware of [employer] if needed.  Patient states she does not have any transition of care, care coordination, disease management, disease monitoring, transportation, community resource, or pharmacy needs at this time.  States she is very appreciative of the follow up call and is in agreement to receive [place] [facility] information.  Medication review completed, patient not taking Protonix and she will discuss with primary during hospital follow up visit.  Outpatient Encounter Prescriptions as of 12/16/2016  Medication Sig Note  . acetaminophen (TYLENOL) 500 MG tablet Take 1,000 mg by mouth every 6 (six) hours as needed.   . flintstones complete (FLINTSTONES) 60 MG chewable tablet Chew 2 tablets by mouth daily.   . meclizine  (ANTIVERT) 25 MG tablet Take 1 tablet (25 mg total) by mouth 3 (three) times daily as needed for dizziness.   . metoprolol tartrate (LOPRESSOR) 25 MG tablet Take 1 tablet (25 mg total) by mouth 2 (two) times daily.   Marland Kitchen topiramate (TOPAMAX) 100 MG tablet Take 200 mg by mouth 2 (two) times daily.   . vitamin B-12 1000 MCG tablet Take 1 tablet (1,000 mcg total) by mouth daily.   . Norethindrone Acetate-Ethinyl Estradiol (JUNEL,LOESTRIN,MICROGESTIN) 1.5-30 MG-MCG tablet Take 1 tablet by mouth daily. (Patient not taking: Reported on 12/05/2016)   . pantoprazole (PROTONIX) 40 MG tablet Take 1 tablet (40 mg total) by mouth daily at 12 noon. (Patient not taking: Reported on 12/16/2016) 12/16/2016: States she is not taking does not have any issues with stomach, will follow up with primary to confirm.    No facility-administered encounter medications on file as of 12/16/2016.    Objective: Per chart review, patient hospitalized 12/08/16 - 12/12/16 for Vertigo and SVT (supraventricular tachycardia).   Assessment: Received UMR Transition of care referral on 12/11/16.   Transition of care follow up completed, no care management needs, and will proceed with case closure.  Plan: RNCM will send patient successful outreach letter, [facility] pamphlet, and magnet. RNCM will send case closure due to follow up completed / no care management needs request to [facility].   Tanya [name] H. Annia Friendly, BSN, [place] Management [facility] Telephonic CM Phone: [phone] Fax: [phone]

## 2016-12-18 DIAGNOSIS — D259 Leiomyoma of uterus, unspecified: Secondary | ICD-10-CM | POA: Diagnosis not present

## 2016-12-18 DIAGNOSIS — Z6841 Body Mass Index (BMI) 40.0 and over, adult: Secondary | ICD-10-CM | POA: Diagnosis not present

## 2016-12-18 DIAGNOSIS — N920 Excessive and frequent menstruation with regular cycle: Secondary | ICD-10-CM | POA: Diagnosis not present

## 2016-12-18 DIAGNOSIS — Z8489 Family history of other specified conditions: Secondary | ICD-10-CM | POA: Diagnosis not present

## 2016-12-18 DIAGNOSIS — Z124 Encounter for screening for malignant neoplasm of cervix: Secondary | ICD-10-CM | POA: Diagnosis not present

## 2016-12-18 DIAGNOSIS — Z842 Family history of other diseases of the genitourinary system: Secondary | ICD-10-CM | POA: Diagnosis not present

## 2016-12-18 DIAGNOSIS — D649 Anemia, unspecified: Secondary | ICD-10-CM | POA: Diagnosis not present

## 2016-12-18 DIAGNOSIS — Z01419 Encounter for gynecological examination (general) (routine) without abnormal findings: Secondary | ICD-10-CM | POA: Diagnosis not present

## 2016-12-18 DIAGNOSIS — N946 Dysmenorrhea, unspecified: Secondary | ICD-10-CM | POA: Diagnosis not present

## 2016-12-20 ENCOUNTER — Other Ambulatory Visit: Payer: Self-pay | Admitting: Radiology

## 2016-12-20 ENCOUNTER — Ambulatory Visit (INDEPENDENT_AMBULATORY_CARE_PROVIDER_SITE_OTHER): Payer: 59 | Admitting: Family Medicine

## 2016-12-20 VITALS — BP 118/80 | HR 91 | Temp 98.5°F | Resp 17 | Ht 61.0 in | Wt 286.2 lb

## 2016-12-20 DIAGNOSIS — R0602 Shortness of breath: Secondary | ICD-10-CM

## 2016-12-20 DIAGNOSIS — D518 Other vitamin B12 deficiency anemias: Secondary | ICD-10-CM

## 2016-12-20 DIAGNOSIS — G43709 Chronic migraine without aura, not intractable, without status migrainosus: Secondary | ICD-10-CM

## 2016-12-20 DIAGNOSIS — D473 Essential (hemorrhagic) thrombocythemia: Secondary | ICD-10-CM | POA: Diagnosis not present

## 2016-12-20 DIAGNOSIS — R200 Anesthesia of skin: Secondary | ICD-10-CM | POA: Diagnosis not present

## 2016-12-20 DIAGNOSIS — R0682 Tachypnea, not elsewhere classified: Secondary | ICD-10-CM | POA: Diagnosis not present

## 2016-12-20 DIAGNOSIS — I471 Supraventricular tachycardia: Secondary | ICD-10-CM

## 2016-12-20 DIAGNOSIS — R0789 Other chest pain: Secondary | ICD-10-CM

## 2016-12-20 DIAGNOSIS — R000 Tachycardia, unspecified: Secondary | ICD-10-CM

## 2016-12-20 DIAGNOSIS — D649 Anemia, unspecified: Secondary | ICD-10-CM | POA: Diagnosis not present

## 2016-12-20 DIAGNOSIS — D75839 Thrombocytosis, unspecified: Secondary | ICD-10-CM

## 2016-12-20 LAB — POCT URINALYSIS DIP (MANUAL ENTRY)
BILIRUBIN UA: NEGATIVE
Bilirubin, UA: NEGATIVE
Blood, UA: NEGATIVE
Glucose, UA: NEGATIVE
LEUKOCYTES UA: NEGATIVE
Nitrite, UA: NEGATIVE
PROTEIN UA: NEGATIVE
Spec Grav, UA: 1.02 (ref 1.030–1.035)
UROBILINOGEN UA: 0.2 (ref ?–2.0)
pH, UA: 6 (ref 5.0–8.0)

## 2016-12-20 MED ORDER — TOPIRAMATE 100 MG PO TABS
200.0000 mg | ORAL_TABLET | Freq: Two times a day (BID) | ORAL | 1 refills | Status: DC
Start: 2016-12-20 — End: 2018-01-14

## 2016-12-20 MED ORDER — METOPROLOL TARTRATE 25 MG PO TABS: 25.0000 mg | ORAL_TABLET | Freq: Three times a day (TID) | ORAL | 1 refills | Status: DC

## 2016-12-20 NOTE — Progress Notes (Signed)
Subjective:    Patient ID: Tanya [name], female    DOB: November 29, 1980, 36 y.o.   MRN: [id number]  12/20/2016  Hospitalization Follow-up (Hypertension)   HPI This 36 y.o. female presents for [name] for recent admission for hypertension.  Here are the details of hospitalization noted in discharge summary:  Admit date: 12/08/2016 Discharge date: 12/12/2016  Time spent: 35 minutes  Recommendations for Outpatient Follow-up:  1. Repeat BMET to follow electrolytes and renal function  2. Please arrange sleep study for patient 3. Reassess HR and titrate metoprolol as needed for better rate control; if needed patient can follow up with Dr. Acie Fredrickson as an outpatient. 4. Needs imperative weight loss  Discharge Diagnoses:  Principal Problem:   Vertigo Active Problems:   Morbid obesity (HCC)   Migraine   Anemia due to vitamin B12 deficiency   Leukocytosis   Numbness in feet   Tachycardia   Thrombocytosis (HCC)   Chest discomfort   SOB (shortness of breath)   SVT (supraventricular tachycardia) ([place])   Tachypnea   Discharge Condition: stable and improved. Discharge home with instructions to follow up with PCP in 2 weeks.  Diet recommendation: low sodium and low calorie diet       Filed Weights   12/08/16 1535 12/12/16 0414  Weight: 127 kg (280 lb) 129 kg (284 lb 6.4 oz)    History of present illness:  Tanya [name] a 36 y.o.femalewith medical history significant of migraine headaches, depression, anxiety, anemia, arthritis, morbid obesity, who presents with dizziness palpitation,numbness in both feet and bilateral leg pain.  Pt states that her dizziness started suddenly after taking shower 4 days ago. Per pt, dizziness is exacerbated by position change of both her body and head. She sees spots in both eyes, but no vision loss. She has mild headache, but no neck rigidity. No ear ringing or hearing loss. She states that started having numbness in both  feet and bilateral whole leg pain and mild leg weakness since yesterday. Noslurred speech or facial droop. She stated that she had back injury 6 month ago and has intermittent lower back pain.   She also reports palpitation, but denies chest pain, SOB, fever, chills, cough. She has nausea, no vomiting, diarrhea or abdominal pain. Patient states that she took Norethindrone Acetate-Ethinyl Estradiol pill recently due to heavy menstrual period, but stopped taking it at the end of February. Pt denies symptoms of UTI. Norecent viral infection. She states that she dose not have hx of hypertension, but had one episode of elevated blood pressure up to 200/105 yesterday.   Hospital Course:  Vertigo with tachycardia (SVT) on standing, chest discomfort and tachypnea/SOB  - due to use of Oral contraceptives D-dimer checked and neg; given continue symptoms performed CTA, which was neg for PE. -2-D echo ordered (to further evaluate cause for SVT); normal EF, no wall motion abnormalities; grade 1 diastolic HF. -neg troponin -MRI brain checked and was normal -Vestibular eval not reporting vertigo and patient symptoms not improving with meclizine -no longer orthostatic  -encourage to maintain adequate hydration  -per cardiology rec's started on metoprolol for SVT; will need further titration as needed base on HR fluctuation. Significant improvement in her symptoms with initiation of metoprolol.  Orthostatic hypotension: on admission  -resolved with IVF's -repeat orthostatic changes neg at discharge  Numbness in feet -Lumbar MRI and brain MRI unrevealing -check B12 level - low normal- give s/c replacement for 2 days and start daily oral B12  Leukocytosis and thrombocytosis -likely due to hemoconcentration - improving with IVF -no source of infection seen  Pain in legs -Tylenol QID as needed for pain -B12 repletion initiated -electrolytes WNL at discharge  Morbid obesity  Body mass index  is 52.91 kg/m. -low calorie diet and exercise discussed with patient -most likely have component of OSA/OHS; will benefit of outpatient sleep apnea test  Migraine - no headache at this time -continue topamax   Anemia: with component of low normal B12 and low MCV - Hb chronically 10-11 (patient with heavy menstrual periods) -will discharge on MV with iron  -started on B12 repletion as well  Chronic diastolic HF -low sodium diet and weight loss recommended  -started on low dose metoprolol BID -instructed to check daily weights   Procedures:  see below for x-ray reports  CTA: neg for PE. Positive enlargement of pulmonary arteries, suggesting pulmonary HTN  2-D echo - Left ventricle: The cavity size was normal. Wall thickness was increased in a pattern of mild LVH. Systolic function was normal. The estimated ejection fraction was in the range of 60% to 65%. Wall motion was normal; there were no regional wall motion abnormalities. Doppler parameters are consistent with high ventricular filling pressure.  Impressions: - Normal LV systolic function; mild LVH; grade 1 diastolic function.  Consultations:  Cardiology   On day of admission, getting ready for work; got dizzy and seeing spots. Laid down and tried to get to work; had [place] call [place] side of [place]. Gave ivf and discharged; continued to suffer with tachycardia and SOB.  Husband called 28 and transported to ED.  In ED, diagnosed with dehydration.  Then had another ED doctor who diagnosed with vertigo which pt was not convinced of diagnosis.  Pt requested second opinion.  Saw Barton Dubois, MD.     Heart rate increased to 170 with ambulation.  s/p CT angio negative; s/p MRI brain and lumbar spine.  Started on medication.  Continues to experience palpitations.  Checking pulse at home; resting HR 90; after shower 120. Blood pressure is better.  Weight has a lot to do with it.  Also started on Protonix  but did not fill.  B12 vitamin due to vitamin B12 deficiency.  Feet stil numb.  Leg cramps are better.  Still taking dizzy pills twice daily.  At home before ED 200104.  Since discharge, running 130s.   Has not been checking BP much.  s/p B12 injection during admission.  Whole foot is numb B.     s/p gynecology consultation; stopped OCP; scheduled for endometrial ablation.    Review of Systems  Constitutional: Negative for chills, diaphoresis, fatigue and fever.  Eyes: Negative for visual disturbance.  Respiratory: Positive for shortness of breath. Negative for cough.   Cardiovascular: Positive for palpitations. Negative for chest pain and leg swelling.  Gastrointestinal: Negative for abdominal pain, constipation, diarrhea, nausea and vomiting.  Endocrine: Negative for cold intolerance, heat intolerance, polydipsia, polyphagia and polyuria.  Neurological: Positive for dizziness and numbness. Negative for tremors, seizures, syncope, facial asymmetry, speech difficulty, weakness, light-headedness and headaches.  Psychiatric/Behavioral: Negative for dysphoric mood and sleep disturbance. The patient is not nervous/anxious.     Past Medical History:  Diagnosis Date  . Anemia   . Anxiety   . Arthritis    L knee OA  . Depression    history of depression in 2010/12/25 with death of mother  . Migraine    Past Surgical History:  Procedure Laterality Date  .  CESAREAN SECTION    . CHOLECYSTECTOMY    . HAND SURGERY    . KNEE SURGERY    . LAPAROSCOPIC UNILATERAL SALPINGECTOMY Left 06/10/2014   Procedure: LAPAROSCOPIC Left SALPINGECTOMY, removal of ectopic pregnancy;  Surgeon: Guss Bunde, MD;  Location: [facility];  Service: Gynecology;  Laterality: Left;  . TUBAL LIGATION    . WISDOM TOOTH EXTRACTION     Allergies  Allergen Reactions  . Imitrex [Sumatriptan] Shortness Of Breath  . Zomig [Zolmitriptan] Shortness Of Breath  . Latex Hives and Swelling  . Sulfa Antibiotics Other (See Comments)     Unknown. Since childhood.     Social History   Social History  . Marital status: Married    Spouse name: N/A  . Number of children: 2  . Years of education: N/A   Occupational History  . nurse tech [employer]   Social History Main Topics  . Smoking status: Never Smoker  . Smokeless tobacco: Never Used  . Alcohol use Yes     Comment: socially  . Drug use: No  . Sexual activity: Not on file   Other Topics Concern  . Not on file   Social History Narrative   Marital status: married x 6 years; second marriage      Children: 2 children (20, 51); 1 stepgrandaughter; 1 stepdaughter      Lives: with husband, one son      Employment: Chartered certified accountant since 2017; fifth floor surgical floor; third shift 3 twelve hour shifts      Tobacco: none      Alcohol: none      Drugs; none      Exercise: none   Family History  Problem Relation Age of Onset  . Breast cancer Mother 65  . Cancer Mother   . Prostate cancer Father 70  . Breast cancer Maternal Aunt     dx in her 45s; BRCA neg  . Prostate cancer Paternal Uncle   . Prostate cancer Paternal Grandfather   . Ovarian cancer Sister 26    found during her pregnancy  . Prostate cancer Paternal Uncle   . Prostate cancer Paternal Uncle   . Cancer Other   . Stroke Other   . Diabetes Other   . Coronary artery disease Other        Objective:    BP 118/80   Pulse 91   Temp 98.5 F (36.9 C) (Oral)   Resp 17   Ht '5\' 1"'  (1.549 m)   Wt 286 lb 3.2 oz (129.8 kg)   LMP 12/04/2016 (Approximate)   SpO2 98%   BMI 54.08 kg/m  Physical Exam  Constitutional: She is oriented to person, place, and time. She appears well-developed and well-nourished. No distress.  HENT:  Head: Normocephalic and atraumatic.  Right Ear: External ear normal.  Left Ear: External ear normal.  Nose: Nose normal.  Mouth/Throat: Oropharynx is clear and moist.  Eyes: Conjunctivae and EOM are normal. Pupils are equal, round, and reactive to light.    Neck: Normal range of motion. Neck supple. Carotid bruit is not present. No thyromegaly present.  Cardiovascular: Normal rate, regular rhythm, normal heart sounds and intact distal pulses.  Exam reveals no gallop and no friction rub.   No murmur heard. Pulmonary/Chest: Effort normal and breath sounds normal. She has no wheezes. She has no rales.  Abdominal: Soft. Bowel sounds are normal. She exhibits no distension and no mass. There is no tenderness. There is no  rebound and no guarding.  Lymphadenopathy:    She has no cervical adenopathy.  Neurological: She is alert and oriented to person, place, and time. No cranial nerve deficit. She exhibits normal muscle tone. Coordination normal.  Skin: Skin is warm and dry. No rash noted. She is not diaphoretic. No erythema. No pallor.  Psychiatric: She has a normal mood and affect. Her behavior is normal. Judgment and thought content normal.        Assessment & Plan:   1. Tachycardia   2. Numbness in feet   3. Other vitamin B12 deficiency anemia   4. Tachypnea   5. Chest discomfort   6. SVT (supraventricular tachycardia) ([place])   7. Chronic migraine without aura without status migrainosus, not intractable   8. Thrombocytosis ([place])   9. Morbid obesity ([place])   10. SOB (shortness of breath)    -new onset tachycardia with DOE, dizziness, and paresthesias.  s/p admission with excellent and thorough work up.  Hospital records and lab results and imaging results reviewed in detail during visit. -clinically feeling better but continues to suffer with DOE and tachycardia; documented SVT during admission; increase Metoprolol dose to tid.  -repeat labs in office as well today.  -refer for sleep study. -scheduled to undergo gastric sleeve for obesity in upcoming two months. -taking vitamin B12 supplement with improvement in paresthesias in legs. -refill of Topamax provided; originally prescribed by [facility] neurology for migraines.   Orders Placed  This Encounter  Procedures  . CBC with Differential/Platelet  . Comprehensive metabolic panel  . Ambulatory referral to Sleep Studies    Referral Priority:   Routine    Referral Type:   Consultation    Referral Reason:   Specialty Services Required    Number of Visits Requested:   1  . POCT urinalysis dipstick  . EKG 12-Lead   Meds ordered this encounter  Medications  . topiramate (TOPAMAX) 100 MG tablet    Sig: Take 2 tablets (200 mg total) by mouth 2 (two) times daily.    Dispense:  360 tablet    Refill:  1  . metoprolol tartrate (LOPRESSOR) 25 MG tablet    Sig: Take 1 tablet (25 mg total) by mouth 3 (three) times daily.    Dispense:  90 tablet    Refill:  1    Return in about 2 weeks (around 01/03/2017) for recheck.   Anyelin Mogle Elayne Guerin, M.D. Primary Care at [facility] [address] [place], [place]  [address] [phone] phone [phone] fax

## 2016-12-20 NOTE — Patient Instructions (Signed)
     IF you received an x-ray today, you will receive an invoice from [facility]. Please contact [facility] at [phone] with questions or concerns regarding your invoice.   IF you received labwork today, you will receive an invoice from [facility]. Please contact [facility] at [phone] with questions or concerns regarding your invoice.   Our billing staff will not be able to assist you with questions regarding bills from these companies.  You will be contacted with the lab results as soon as they are available. The fastest way to get your results is to activate your My Chart account. Instructions are located on the last page of this paperwork. If you have not heard from us regarding the results in 2 weeks, please contact this office.     

## 2016-12-21 LAB — CBC WITH DIFFERENTIAL/PLATELET
Basophils Absolute: 0 10*3/uL (ref 0.0–0.2)
Basos: 0 %
EOS (ABSOLUTE): 0.2 10*3/uL (ref 0.0–0.4)
EOS: 2 %
HEMATOCRIT: 37.1 % (ref 34.0–46.6)
Hemoglobin: 11.2 g/dL (ref 11.1–15.9)
Immature Grans (Abs): 0 10*3/uL (ref 0.0–0.1)
Immature Granulocytes: 0 %
LYMPHS ABS: 2.5 10*3/uL (ref 0.7–3.1)
Lymphs: 27 %
MCH: 22.2 pg — ABNORMAL LOW (ref 26.6–33.0)
MCHC: 30.2 g/dL — AB (ref 31.5–35.7)
MCV: 74 fL — AB (ref 79–97)
MONOS ABS: 0.5 10*3/uL (ref 0.1–0.9)
Monocytes: 6 %
Neutrophils Absolute: 5.9 10*3/uL (ref 1.4–7.0)
Neutrophils: 65 %
Platelets: 501 10*3/uL — ABNORMAL HIGH (ref 150–379)
RBC: 5.04 x10E6/uL (ref 3.77–5.28)
RDW: 16.7 % — AB (ref 12.3–15.4)
WBC: 9.1 10*3/uL (ref 3.4–10.8)

## 2016-12-21 LAB — COMPREHENSIVE METABOLIC PANEL
ALBUMIN: 3.9 g/dL (ref 3.5–5.5)
ALT: 21 IU/L (ref 0–32)
AST: 16 IU/L (ref 0–40)
Albumin/Globulin Ratio: 1.4 (ref 1.2–2.2)
Alkaline Phosphatase: 52 IU/L (ref 39–117)
BUN/Creatinine Ratio: 18 (ref 9–23)
BUN: 13 mg/dL (ref 6–20)
Bilirubin Total: <0.2 mg/dL (ref 0.0–1.2)
CHLORIDE: 105 mmol/L (ref 96–106)
CO2: 19 mmol/L (ref 18–29)
CREATININE: 0.74 mg/dL (ref 0.57–1.00)
Calcium: 9.1 mg/dL (ref 8.7–10.2)
GFR calc non Af Amer: 103 mL/min/{1.73_m2} (ref >59)
GFR, EST AFRICAN AMERICAN: 119 mL/min/{1.73_m2} (ref >59)
Globulin, Total: 2.7 g/dL (ref 1.5–4.5)
Glucose: 95 mg/dL (ref 65–99)
Potassium: 4.4 mmol/L (ref 3.5–5.2)
Sodium: 141 mmol/L (ref 134–144)
TOTAL PROTEIN: 6.6 g/dL (ref 6.0–8.5)

## 2016-12-22 ENCOUNTER — Encounter: Payer: Self-pay | Admitting: Family Medicine

## 2016-12-23 ENCOUNTER — Telehealth: Payer: Self-pay | Admitting: Family Medicine

## 2016-12-23 NOTE — Telephone Encounter (Signed)
Patient needs FMLA forms completed by Dr [name], she sent an email to you stating this was for a condition that she was seen for at the hospital so I was not sure if this was something you would complete her FMLA for. I highlighted everything that needs to be addressed and I will place the forms in your box on 12/23/16.  Please return them to the FMLA/Disability box at the [employer] checkout desk within 5-7 business days. Thank you!

## 2016-12-25 NOTE — Telephone Encounter (Signed)
Patient came by stating that Dr [name] had texted her about bringing in her FMLA forms, the forms have been filled out and placed in your box since 12/23/16. I moved them to the top of the stack so you could see them.  Patient took her blank copies back home with her since there is already a copy here.

## 2016-12-26 LAB — IRON AND TIBC
Iron Saturation: 7 % — CL (ref 15–55)
Iron: 27 ug/dL (ref 27–159)
Total Iron Binding Capacity: 393 ug/dL (ref 250–450)
UIBC: 366 ug/dL (ref 131–425)

## 2016-12-26 LAB — SPECIMEN STATUS REPORT

## 2016-12-29 NOTE — Telephone Encounter (Signed)
FMLA paperwork completed; will drop off at [facility] desk this afternoon when in the office.

## 2016-12-29 NOTE — Telephone Encounter (Signed)
Paperwork scanned and faxed to [employer] on 12/29/16 °

## 2016-12-30 ENCOUNTER — Ambulatory Visit (INDEPENDENT_AMBULATORY_CARE_PROVIDER_SITE_OTHER): Payer: 59 | Admitting: Psychiatry

## 2016-12-30 DIAGNOSIS — F509 Eating disorder, unspecified: Secondary | ICD-10-CM

## 2016-12-31 ENCOUNTER — Ambulatory Visit (INDEPENDENT_AMBULATORY_CARE_PROVIDER_SITE_OTHER): Payer: 59 | Admitting: Psychiatry

## 2016-12-31 DIAGNOSIS — F509 Eating disorder, unspecified: Secondary | ICD-10-CM | POA: Diagnosis not present

## 2017-01-02 ENCOUNTER — Ambulatory Visit (INDEPENDENT_AMBULATORY_CARE_PROVIDER_SITE_OTHER): Payer: 59 | Admitting: Family Medicine

## 2017-01-02 VITALS — BP 119/79 | HR 83 | Temp 98.0°F | Resp 18 | Ht 62.0 in | Wt 284.0 lb

## 2017-01-02 DIAGNOSIS — R0602 Shortness of breath: Secondary | ICD-10-CM | POA: Diagnosis not present

## 2017-01-02 DIAGNOSIS — R200 Anesthesia of skin: Secondary | ICD-10-CM

## 2017-01-02 DIAGNOSIS — R000 Tachycardia, unspecified: Secondary | ICD-10-CM

## 2017-01-02 DIAGNOSIS — E538 Deficiency of other specified B group vitamins: Secondary | ICD-10-CM

## 2017-01-02 DIAGNOSIS — D500 Iron deficiency anemia secondary to blood loss (chronic): Secondary | ICD-10-CM

## 2017-01-02 DIAGNOSIS — I471 Supraventricular tachycardia: Secondary | ICD-10-CM | POA: Diagnosis not present

## 2017-01-02 MED ORDER — METOPROLOL TARTRATE 25 MG PO TABS: 50.0000 mg | ORAL_TABLET | Freq: Two times a day (BID) | ORAL | 1 refills | Status: DC

## 2017-01-02 MED ORDER — CYANOCOBALAMIN 1000 MCG/ML IJ SOLN
1000.0000 ug | INTRAMUSCULAR | Status: DC
  Administered 2017-01-02: 1000 ug via INTRAMUSCULAR

## 2017-01-02 NOTE — Progress Notes (Signed)
Subjective:    Patient ID: Tanya [name], female    DOB: 10/23/1980, 36 y.o.   MRN: [id number]  01/02/2017  Follow-up   HPI This 37 y.o. female presents for two week follow-up for SVT/tachycardia, dizziness, DOE.  Management changes at last visit increased to Metoprolol 93m tid.  Blood pressure is 130s.    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  Working, heart rate is up; with sitting, heart rate goes down.  Heart rate is 80-90s.  Will stay in lower 80s.  At work, upper 90s.  Walking into work from parking deck, heart rate in 115s.  Starting walking two days per week; heart rate goes up at 115s.  Loop is one mile; walks one mile but must stop several times. Does not understand, why everything has changed.  Has cut out soda.  Does not check heart rate while sitting on couch.  Taking Metoprolol one every morning and two at night.  No horrible episodes.  Numbness and tingling in feet still present; feet hurt like crazy.  Taking iron per [place]; one daily.  Not constipated.    Surgery scheduled 02/17/17; doing sleeve.  Immunization History  Administered Date(s) Administered  . Influenza-Unspecified 07/06/2016   BP Readings from Last 3 Encounters:  01/30/17 114/68  01/21/17 116/78  01/12/17 140/86   Wt Readings from Last 3 Encounters:  01/30/17 280 lb 2 oz (127.1 kg)  01/21/17 280 lb 9.6 oz (127.3 kg)  01/12/17 280 lb (127 kg)    Review of Systems  Constitutional: Negative for chills, diaphoresis, fatigue and fever.  Eyes: Negative for visual disturbance.  Respiratory: Positive for shortness of breath. Negative for cough.   Cardiovascular: Positive for palpitations. Negative for chest pain and leg swelling.  Gastrointestinal: Negative for abdominal pain, constipation, diarrhea, nausea and vomiting.  Endocrine: Negative for cold intolerance, heat intolerance, polydipsia, polyphagia and polyuria.  Musculoskeletal: Positive for arthralgias.    Neurological: Positive for numbness. Negative for dizziness, tremors, seizures, syncope, facial asymmetry, speech difficulty, weakness, light-headedness and headaches.    Past Medical History:  Diagnosis Date  . Anemia   . Anxiety   . Arthritis    L knee OA  . Depression    history of depression in 203/05/12with death of mother  . Hypertension   . Migraine    Past Surgical History:  Procedure Laterality Date  . CESAREAN SECTION    . CHOLECYSTECTOMY    . HAND SURGERY    . KNEE SURGERY    . LAPAROSCOPIC UNILATERAL SALPINGECTOMY Left 06/10/2014   Procedure: LAPAROSCOPIC Left SALPINGECTOMY, removal of ectopic pregnancy;  Surgeon: KGuss Bunde MD;  [facility];  Service: Gynecology;  Laterality: Left;  . TUBAL LIGATION    . WISDOM TOOTH EXTRACTION     Allergies  Allergen Reactions  . Imitrex [Sumatriptan] Shortness Of Breath  . Zomig [Zolmitriptan] Shortness Of Breath  . Latex Hives and Swelling  . Sulfa Antibiotics Other (See Comments)    Unknown. Since childhood.     Social History   Social History  . Marital status: Married    Spouse name: N/A  . Number of children: 2  . Years of education: N/A   Occupational History  . nurse tech [employer]  Social History Main Topics  . Smoking status: Never Smoker  . Smokeless tobacco: Never Used  . Alcohol use Yes     Comment: socially  .  Drug use: No  . Sexual activity: Not on file   Other Topics Concern  . Not on file   Social History Narrative   Marital status: married x 6 years; second marriage      Children: 2 children (20, 8); 1 stepgrandaughter; 1 stepdaughter      Lives: with husband, one son      Employment: Chartered certified accountant since 2017; fifth floor surgical floor; third shift 3 twelve hour shifts      Tobacco: none      Alcohol: none      Drugs; none      Exercise: none   Family History  Problem Relation Age of Onset  . Breast cancer Mother 25  . Cancer Mother   . Prostate cancer Father  37  . Breast cancer Maternal Aunt     dx in her 31s; BRCA neg  . Prostate cancer Paternal Uncle   . Prostate cancer Paternal Grandfather   . Ovarian cancer Sister 68    found during her pregnancy  . Prostate cancer Paternal Uncle   . Prostate cancer Paternal Uncle   . Cancer Other   . Stroke Other   . Diabetes Other   . Coronary artery disease Other        Objective:    BP 119/79   Pulse 83   Temp 98 F (36.7 C) (Oral)   Resp 18   Ht _0  (1.575 m)   Wt 284 lb (128.8 kg)   LMP 12/15/2016   SpO2 100%   BMI 51.94 kg/m  Physical Exam  Constitutional: She is oriented to person, place, and time. She appears well-developed and well-nourished. No distress.  HENT:  Head: Normocephalic and atraumatic.  Right Ear: External ear normal.  Left Ear: External ear normal.  Nose: Nose normal.  Mouth/Throat: Oropharynx is clear and moist.  Eyes: Conjunctivae and EOM are normal. Pupils are equal, round, and reactive to light.  Neck: Normal range of motion. Neck supple. Carotid bruit is not present. No thyromegaly present.  Cardiovascular: Normal rate, regular rhythm, normal heart sounds and intact distal pulses.  Exam reveals no gallop and no friction rub.   No murmur heard. Pulmonary/Chest: Effort normal and breath sounds normal. She has no wheezes. She has no rales.  Abdominal: Soft. Bowel sounds are normal. She exhibits no distension and no mass. There is no tenderness. There is no rebound and no guarding.  Musculoskeletal:       Right foot: There is tenderness and bony tenderness. There is normal range of motion, no swelling, normal capillary refill, no crepitus, no deformity and no laceration.       Left foot: There is tenderness and bony tenderness. There is normal range of motion, no swelling, normal capillary refill, no crepitus, no deformity and no laceration.  Lymphadenopathy:    She has no cervical adenopathy.  Neurological: She is alert and oriented to person, place, and  time. No cranial nerve deficit.  Skin: Skin is warm and dry. No rash noted. She is not diaphoretic. No erythema. No pallor.  Psychiatric: She has a normal mood and affect. Her behavior is normal.   Results for orders placed or performed in visit on 12/20/16  CBC with Differential/Platelet  Result Value Ref Range   WBC 9.1 3.4 - 10.8 x10E3/uL   RBC 5.04 3.77 - 5.28 x10E6/uL   Hemoglobin 11.2 11.1 - 15.9 g/dL   Hematocrit 37.1 34.0 - 46.6 %   MCV 74 (L) 79 -  97 fL   MCH 22.2 (L) 26.6 - 33.0 pg   MCHC 30.2 (L) 31.5 - 35.7 g/dL   RDW 16.7 (H) 12.3 - 15.4 %   Platelets 501 (H) 150 - 379 x10E3/uL   Neutrophils 65 Not Estab. %   Lymphs 27 Not Estab. %   Monocytes 6 Not Estab. %   Eos 2 Not Estab. %   Basos 0 Not Estab. %   Neutrophils Absolute 5.9 1.4 - 7.0 x10E3/uL   Lymphocytes Absolute 2.5 0.7 - 3.1 x10E3/uL   Monocytes Absolute 0.5 0.1 - 0.9 x10E3/uL   EOS (ABSOLUTE) 0.2 0.0 - 0.4 x10E3/uL   Basophils Absolute 0.0 0.0 - 0.2 x10E3/uL   Immature Granulocytes 0 Not Estab. %   Immature Grans (Abs) 0.0 0.0 - 0.1 x10E3/uL  Comprehensive metabolic panel  Result Value Ref Range   Glucose 95 65 - 99 mg/dL   BUN 13 6 - 20 mg/dL   Creatinine, Ser 0.74 0.57 - 1.00 mg/dL   GFR calc non Af Amer 103 >59 mL/min/1.73   GFR calc Af Amer 119 >59 mL/min/1.73   BUN/Creatinine Ratio 18 9 - 23   Sodium 141 134 - 144 mmol/L   Potassium 4.4 3.5 - 5.2 mmol/L   Chloride 105 96 - 106 mmol/L   CO2 19 18 - 29 mmol/L   Calcium 9.1 8.7 - 10.2 mg/dL   Total Protein 6.6 6.0 - 8.5 g/dL   Albumin 3.9 3.5 - 5.5 g/dL   Globulin, Total 2.7 1.5 - 4.5 g/dL   Albumin/Globulin Ratio 1.4 1.2 - 2.2   Bilirubin Total <0.2 0.0 - 1.2 mg/dL   Alkaline Phosphatase 52 39 - 117 IU/L   AST 16 0 - 40 IU/L   ALT 21 0 - 32 IU/L  Iron and TIBC  Result Value Ref Range   Total Iron Binding Capacity 393 250 - 450 ug/dL   UIBC 366 131 - 425 ug/dL   Iron 27 27 - 159 ug/dL   Iron Saturation 7 (LL) 15 - 55 %  Specimen status  report  Result Value Ref Range   specimen status report Comment   POCT urinalysis dipstick  Result Value Ref Range   Color, UA yellow yellow   Clarity, UA clear clear   Glucose, UA negative negative   Bilirubin, UA negative negative   Ketones, POC UA negative negative   Spec Grav, UA 1.020 1.030 - 1.035   Blood, UA negative negative   pH, UA 6.0 5.0 - 8.0   Protein Ur, POC negative negative   Urobilinogen, UA 0.2 Negative - 2.0   Nitrite, UA Negative Negative   Leukocytes, UA Negative Negative       Assessment & Plan:   1. Tachycardia   2. Vitamin B12 deficiency   3. Paroxysmal supraventricular tachycardia ([place])   4. Morbid obesity ([place])   5. Numbness in feet   6. SOB (shortness of breath)   7. Iron deficiency anemia due to chronic blood loss    -improving yet persistent; increase Metoprolol to 54m bid.  RTC one month. -new onset iron deficiency; recently started iron per gynecology. Increase supplement to bid with upcoming surgery.  -numbness still present in feet; continue to take Vitamin B12 supplement.  -scheduled for gastric sleeve in upcoming two months.     Orders Placed This Encounter  Procedures  . EKG 12-Lead   Meds ordered this encounter  Medications  . IRON PO    Sig: Take 65 mg by  mouth 2 (two) times daily.   . cyanocobalamin ((VITAMIN B-12)) injection 1,000 mcg  . metoprolol tartrate (LOPRESSOR) 25 MG tablet    Sig: Take 2 tablets (50 mg total) by mouth 2 (two) times daily.    Dispense:  360 tablet    Refill:  1    Return in about 4 weeks (around 01/30/2017) for recheck blood pressure, heart rate.   Joeli Fenner Elayne Guerin, M.D. Primary [facility] at [facility] [address] [place], [place]  [address] [phone] phone [phone] fax

## 2017-01-02 NOTE — Patient Instructions (Addendum)
Increase iron supplement to two tablets daily; one tablet twice daily. Continue Vitamin B12 vitamin daily. Continue Flinstone vitamin. Increase Metoprolol to two tablets twice daily. Monitor blood pressure and pulse at rest; I want pulse greater than 55; I want blood pressure above 110/55.     IF you received an x-ray today, you will receive an invoice from [facility]. Please contact [facility] at [phone] with questions or concerns regarding your invoice.   IF you received labwork today, you will receive an invoice from [facility]. Please contact [facility] at [phone] with questions or concerns regarding your invoice.   Our billing staff will not be able to assist you with questions regarding bills from these companies.  You will be contacted with the lab results as soon as they are available. The fastest way to get your results is to activate your My Chart account. Instructions are located on the last page of this paperwork. If you have not heard from [place] regarding the results in 2 weeks, please contact this office.

## 2017-01-05 ENCOUNTER — Encounter: Payer: 59 | Attending: Surgery | Admitting: Skilled Nursing Facility1

## 2017-01-05 ENCOUNTER — Encounter: Payer: Self-pay | Admitting: Skilled Nursing Facility1

## 2017-01-05 DIAGNOSIS — Z6841 Body Mass Index (BMI) 40.0 and over, adult: Secondary | ICD-10-CM | POA: Diagnosis not present

## 2017-01-05 DIAGNOSIS — Z713 Dietary counseling and surveillance: Secondary | ICD-10-CM | POA: Insufficient documentation

## 2017-01-05 MED FILL — PANTOPRAZOLE SOD DR 40 MG T: 40 | 30 days supply | Qty: 30 | Fill #0

## 2017-01-05 MED FILL — LANSOPRAZOL-AMOXICIL-CLARIT: 14 days supply | Qty: 112 | Fill #0

## 2017-01-05 MED FILL — METOPROLOL TARTRATE 25 MG T: 25 | 90 days supply | Qty: 360 | Fill #0

## 2017-01-05 NOTE — Progress Notes (Signed)
  Pre-Operative Nutrition Class:  Appt start time: 4142   End time:  1830.  Patient was seen on 01/05/2017 for Pre-Operative Bariatric Surgery Education at [facility].   Surgery date:  Surgery type:  Start weight at [facility]: 289 Weight today: 284  [place]AN[place]  BODY COMP RESULTS  01/05/2017   BMI (kg/m^2) 53.7   Fat Mass (lbs) 154   Fat Free Mass (lbs) 130   Total Body Water (lbs) 97.2   Samples given per MNT protocol. Patient educated on appropriate usage: Bariatric Advantage Calcium Citrate Lot #[id number] Exp: 01/28/2017  Premier Shake Lot # [id number] Exp: 08/08/2017  Opurity Multivitamin: Lot#: [id number]: 12/2017  The following the learning objectives were met by the patient during this course:  Identify Pre-Op Dietary Goals and will begin 2 weeks pre-operatively  Identify appropriate sources of fluids and proteins   State protein recommendations and appropriate sources pre and post-operatively  Identify Post-Operative Dietary Goals and will follow for 2 weeks post-operatively  Identify appropriate multivitamin and calcium sources  Describe the need for physical activity post-operatively and will follow MD recommendations  State when to call healthcare provider regarding medication questions or post-operative complications  Handouts given during class include:  Pre-Op Bariatric Surgery Diet Handout  Protein Shake Handout  Post-Op Bariatric Surgery Nutrition Handout  [facility]ELT Program Information Flyer  Support Group Information Flyer  [facility] Bariatric Supplements Price List  Follow[facility]Up Plan: [facility] per MD.

## 2017-01-12 ENCOUNTER — Encounter: Payer: Self-pay | Admitting: Neurology

## 2017-01-12 ENCOUNTER — Ambulatory Visit (INDEPENDENT_AMBULATORY_CARE_PROVIDER_SITE_OTHER): Payer: 59 | Admitting: Neurology

## 2017-01-12 VITALS — BP 140/86 | HR 76 | Resp 20 | Ht 61.0 in | Wt 280.0 lb

## 2017-01-12 DIAGNOSIS — Z6841 Body Mass Index (BMI) 40.0 and over, adult: Secondary | ICD-10-CM | POA: Diagnosis not present

## 2017-01-12 DIAGNOSIS — R351 Nocturia: Secondary | ICD-10-CM | POA: Diagnosis not present

## 2017-01-12 DIAGNOSIS — G4726 Circadian rhythm sleep disorder, shift work type: Secondary | ICD-10-CM | POA: Diagnosis not present

## 2017-01-12 DIAGNOSIS — R0683 Snoring: Secondary | ICD-10-CM | POA: Diagnosis not present

## 2017-01-12 DIAGNOSIS — R400 Somnolence: Secondary | ICD-10-CM

## 2017-01-12 NOTE — Patient Instructions (Signed)

## 2017-01-12 NOTE — Progress Notes (Signed)
Subjective:    Tanya [name] ID: Tanya Tanya [name] is a 36 y.o. female.  HPI     [name] Age, MD, PhD [facility] [address] [place], [place] [address]  Dear Dr. [name],   I saw your Tanya [name], Tanya Tanya [name], upon your kind request in my neurologic clinic today for initial consultation of her sleep disorder, concern for obstructive sleep apnea. Tanya Tanya [name] is unaccompanied today. As you know, Tanya Tanya [name] is a 36 year old right-handed woman with an underlying medical history of anxiety, depression, osteoarthritis, migraine headaches, anemia and morbid obesity, who reports snoring and excessive daytime somnolence. I reviewed your office note from 01/02/2017. She had a sleep study several years ago with neg. Results for OSA, per her verbal report. She does not recall where she had Tanya test.  She is in Tanya process of being evaluated for bariatric surgery. Her Epworth sleepiness score is 12 out of 24 today, her fatigue score is 42 out of 63 today. She lives at home with her husband and son. She has 2 children. She is a nonsmoker and does not drink alcohol or use illicit drugs and does not utilize caffeine on a daily basis. 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 Bedtime typically is a 30 a.m. and she wakes up around 4 PM. She sleeps at night when she can. Bedtime for nighttime sleep is 10 PM and she wakes up around 9 AM and if she has to work that night she also takes a nap before going into work. She has discontinued caffeine in Tanya past couple of months. She was recently started on metoprolol for SVT. She reports nocturia about 2-3 times on an average night. She denies morning headaches and feels that her migraines are under control on Topamax. She has no family history of OSA.   Her Past Medical History Is Significant For: Past Medical History:  Diagnosis Date  . Anemia   .  Anxiety   . Arthritis    L knee OA  . Depression    history of depression in 12-10-2010 with death of mother  . Migraine     Her Past Surgical History Is Significant For: Past Surgical History:  Procedure Laterality Date  . CESAREAN SECTION    . CHOLECYSTECTOMY    . HAND SURGERY    . KNEE SURGERY    . LAPAROSCOPIC UNILATERAL SALPINGECTOMY Left 06/10/2014   Procedure: LAPAROSCOPIC Left SALPINGECTOMY, removal of ectopic pregnancy;  Surgeon: Guss Bunde, MD;  Location: [facility];  Service: Gynecology;  Laterality: Left;  . TUBAL LIGATION    . WISDOM TOOTH EXTRACTION      Her Family History Is Significant For: Family History  Problem Relation Age of Onset  . Breast cancer Mother 45  . Cancer Mother   . Prostate cancer Father 74  . Breast cancer Maternal Aunt     dx in her 13s; BRCA neg  . Prostate cancer Paternal Uncle   . Prostate cancer Paternal Grandfather   . Ovarian cancer Sister 66    found during her pregnancy  . Prostate cancer Paternal Uncle   . Prostate cancer Paternal Uncle   . Cancer Other   . Stroke Other   . Diabetes Other   . Coronary artery disease Other     Her Social History Is Significant For: Social History   Social History  . Marital  status: Married    Spouse name: N/A  . Number of children: 2  . Years of education: N/A   Occupational History  . nurse tech [employer]   Social History Main Topics  . Smoking status: Never Smoker  . Smokeless tobacco: Never Used  . Alcohol use Yes     Comment: socially  . Drug use: No  . Sexual activity: Not Asked   Other Topics Concern  . None   Social History Narrative   Marital status: married x 6 years; second marriage      Children: 2 children (53, 23); 1 stepgrandaughter; 1 stepdaughter      Lives: with husband, one son      Employment: Chartered certified accountant since 2017; fifth floor surgical floor; third shift 3 twelve hour shifts      Tobacco: none      Alcohol: none      Drugs; none       Exercise: none    Her Allergies Are:  Allergies  Allergen Reactions  . Imitrex [Sumatriptan] Shortness Of Breath  . Zomig [Zolmitriptan] Shortness Of Breath  . Latex Hives and Swelling  . Sulfa Antibiotics Other (See Comments)    Unknown. Since childhood.   :   Her Current Medications Are:  Outpatient Encounter Prescriptions as of 01/12/2017  Medication Sig  . acetaminophen (TYLENOL) 500 MG tablet Take 1,000 mg by mouth every 6 (six) hours as needed.  Marland Kitchen amoxicillin-clarithromycin-lansoprazole (PREVPAC) combo pack   . flintstones complete (FLINTSTONES) 60 MG chewable tablet Chew 2 tablets by mouth daily.  . IRON PO Take 65 mg by mouth daily.  . meclizine (ANTIVERT) 25 MG tablet Take 1 tablet (25 mg total) by mouth 3 (three) times daily as needed for dizziness.  . metoprolol tartrate (LOPRESSOR) 25 MG tablet Take 2 tablets (50 mg total) by mouth 2 (two) times daily.  Marland Kitchen topiramate (TOPAMAX) 100 MG tablet Take 2 tablets (200 mg total) by mouth 2 (two) times daily.  . vitamin B-12 1000 MCG tablet Take 1 tablet (1,000 mcg total) by mouth daily.  . [DISCONTINUED] Norethindrone Acetate-Ethinyl Estradiol (JUNEL,LOESTRIN,MICROGESTIN) 1.5-30 MG-MCG tablet Take 1 tablet by mouth daily. (Tanya [name] not taking: Reported on 12/05/2016)  . [DISCONTINUED] pantoprazole (PROTONIX) 40 MG tablet Take 1 tablet (40 mg total) by mouth daily at 12 noon. (Tanya [name] not taking: Reported on 01/02/2017)   Facility-Administered Encounter Medications as of 01/12/2017  Medication  . cyanocobalamin ((VITAMIN B-12)) injection 1,000 mcg  :  Review of Systems:  Out of a complete 14 point review of systems, all are reviewed and negative with Tanya exception of these symptoms as listed below: Review of Systems  Neurological:       Pt presents today to discuss her sleep. Pt has had a sleep study 6-7 years ago. Pt will be undergoing bariatric surgery soon. Pt does endorse snoring.  Epworth Sleepiness Scale 0= would never doze 1=  slight chance of dozing 2= moderate chance of dozing 3= high chance of dozing  Sitting and reading: 2 Watching TV: 3 Sitting inactive in a public place (ex. Theater or meeting): 2 As a passenger in a car for an hour without a break: 1 Lying down to rest in Tanya afternoon: 2 Sitting and talking to someone: 0 Sitting quietly after lunch (no alcohol): 2 In a car, while stopped in traffic: 0 Total: 12     Objective:  Neurologic Exam  Physical Exam Physical Examination:   Vitals:   01/12/17 2841  BP: 140/86  Pulse: 76  Resp: 20    General Examination: Tanya Tanya [name] is a very pleasant 36 y.o. female in no acute distress. She appears well-developed and well-nourished and well groomed.   HEENT: Normocephalic, atraumatic, pupils are equal, round and reactive to light and accommodation. Extraocular tracking is good without limitation to gaze excursion or nystagmus noted. Normal smooth pursuit is noted. Hearing is grossly intact. Face is symmetric with normal facial animation and normal facial sensation. Speech is clear with no dysarthria noted. There is no hypophonia. There is no lip, neck/head, jaw or voice tremor. Neck is supple with full range of passive and active motion. There are no carotid bruits on auscultation. Oropharynx exam reveals: mild mouth dryness, good dental hygiene and mild airway crowding, due to thicker uvula and tonsils in place, 1+ b/l. Mallampati is class I. Tongue protrudes centrally and palate elevates symmetrically. Neck size is 16.25 inches. She has a Mild overbite.    Chest: Clear to auscultation without wheezing, rhonchi or crackles noted.  Heart: S1+S2+0, regular and normal without murmurs, rubs or gallops noted.   Abdomen: Soft, non-tender and non-distended with normal bowel sounds appreciated on auscultation.  Extremities: There is no pitting edema in Tanya distal lower extremities bilaterally. Pedal pulses are intact.  Skin: Warm and dry without trophic  changes noted. There are no varicose veins.  Musculoskeletal: exam reveals no obvious joint deformities, tenderness or joint swelling or erythema.   Neurologically:  Mental status: Tanya Tanya [name] is awake, alert and oriented in all 4 spheres. Her immediate and remote memory, attention, language skills and fund of knowledge are appropriate. There is no evidence of aphasia, agnosia, apraxia or anomia. Speech is clear with normal prosody and enunciation. Thought process is linear. Mood is normal and affect is normal.  Cranial nerves II - XII are as described above under HEENT exam. In addition: shoulder shrug is normal with equal shoulder height noted. Motor exam: Normal bulk, strength and tone is noted. There is no drift, tremor or rebound. Romberg is negative. Reflexes are 1+ throughout. Fine motor skills and coordination: intact with normal finger taps, normal hand movements, normal rapid alternating patting, normal foot taps and normal foot agility.  Cerebellar testing: No dysmetria or intention tremor on finger to nose testing. Heel to shin is slightly difficult for Tanya left secondary to knee discomfort.  Sensory exam: intact to light touch in Tanya upper and lower extremities.  Gait, station and balance: She stands easily. No veering to one side is noted. No leaning to one side is noted. Posture is age-appropriate and stance is narrow based. Gait shows normal stride length and normal pace. No problems turning are noted. Tandem walk is unremarkable.      Assessment and Plan:  In summary, Tanya Tanya [name] is a very pleasant 36 y.o.-year old female with an underlying medical history of anxiety, depression, osteoarthritis, migraine headaches, anemia and morbid obesity, whose history and physical exam are concerning for obstructive sleep apnea (OSA). A confounding factor causing her daytime somnolence is her third shift work schedule and therefore also shifting sleep schedule.  I had a long chat with Tanya  Tanya [name] about my findings and Tanya diagnosis of OSA, its prognosis and treatment options. We talked about medical treatments, surgical interventions and non-pharmacological approaches. I explained in particular Tanya risks and ramifications of untreated moderate to severe OSA, especially with respect to developing cardiovascular disease down Tanya Road, including congestive heart failure, difficult to treat hypertension, cardiac arrhythmias,  or stroke. Even type 2 diabetes has, in part, been linked to untreated OSA. Symptoms of untreated OSA include daytime sleepiness, memory problems, mood irritability and mood disorder such as depression and anxiety, lack of energy, as well as recurrent headaches, especially morning headaches. We talked about trying to maintain a  healthy lifestyle in general, as well as Tanya importance of weight control. I encouraged Tanya Tanya [name] to eat healthy, exercise daily and keep well hydrated, to keep a scheduled bedtime and wake time routine, to not skip any meals and eat healthy snacks in between meals. I advised Tanya Tanya [name] not to drive when feeling sleepy. Especially in light of her upcoming bariatric surgery, OSA should be excluded and treated when found.  I recommended Tanya following at this time: sleep study with potential positive airway pressure titration. (We will score hypopneas at 3%).   I explained Tanya sleep test procedure to Tanya Tanya [name] and also outlined possible surgical and non-surgical treatment options of OSA, including Tanya use of a custom-made dental device (which would require a referral to a specialist dentist or oral surgeon), upper airway surgical options, such as pillar implants, radiofrequency surgery, tongue base surgery, and UPPP (which would involve a referral to an ENT surgeon). Rarely, jaw surgery such as mandibular advancement may be considered.  I also explained Tanya CPAP treatment option to Tanya Tanya [name], who indicated that she would be willing to try CPAP if Tanya  need arises. I explained Tanya importance of being compliant with PAP treatment, not only for insurance purposes but primarily to improve Her symptoms, and for Tanya Tanya [name]'s long term health benefit, including to reduce Her cardiovascular risks. I answered all her questions today and Tanya Tanya [name] was in agreement. I would like to see her back after Tanya sleep study is completed and encouraged her to call with any interim questions, concerns, problems or updates.   Thank you very much for allowing me to participate in Tanya care of this nice Tanya [name]. If I can be of any further assistance to you please do not hesitate to call me at [phone].  Sincerely,   [name] Age, MD, PhD

## 2017-01-21 ENCOUNTER — Encounter: Payer: Self-pay | Admitting: Family Medicine

## 2017-01-21 ENCOUNTER — Ambulatory Visit (INDEPENDENT_AMBULATORY_CARE_PROVIDER_SITE_OTHER): Payer: 59 | Admitting: Family Medicine

## 2017-01-21 VITALS — BP 116/78 | HR 72 | Temp 98.0°F | Resp 16 | Ht 61.0 in | Wt 280.6 lb

## 2017-01-21 DIAGNOSIS — I471 Supraventricular tachycardia: Secondary | ICD-10-CM | POA: Diagnosis not present

## 2017-01-21 DIAGNOSIS — G43709 Chronic migraine without aura, not intractable, without status migrainosus: Secondary | ICD-10-CM

## 2017-01-21 DIAGNOSIS — R0682 Tachypnea, not elsewhere classified: Secondary | ICD-10-CM | POA: Diagnosis not present

## 2017-01-21 DIAGNOSIS — R000 Tachycardia, unspecified: Secondary | ICD-10-CM

## 2017-01-21 DIAGNOSIS — D518 Other vitamin B12 deficiency anemias: Secondary | ICD-10-CM | POA: Diagnosis not present

## 2017-01-21 DIAGNOSIS — D500 Iron deficiency anemia secondary to blood loss (chronic): Secondary | ICD-10-CM

## 2017-01-21 DIAGNOSIS — R200 Anesthesia of skin: Secondary | ICD-10-CM

## 2017-01-21 NOTE — Patient Instructions (Addendum)
IF you received an x-ray today, you will receive an invoice from [facility]. Please contact [facility] at [phone] with questions or concerns regarding your invoice.   IF you received labwork today, you will receive an invoice from [facility]. Please contact [facility] at [phone] with questions or concerns regarding your invoice.   Our billing staff will not be able to assist you with questions regarding bills from these companies.  You will be contacted with the lab results as soon as they are available. The fastest way to get your results is to activate your My Chart account. Instructions are located on the last page of this paperwork. If you have not heard from [place] regarding the results in 2 weeks, please contact this office.      Plantar Fasciitis Rehab Ask your health care provider which exercises are safe for you. Do exercises exactly as told by your health care provider and adjust them as directed. It is normal to feel mild stretching, pulling, tightness, or discomfort as you do these exercises, but you should stop right away if you feel sudden pain or your pain gets worse. Do not begin these exercises until told by your health care provider. Stretching and range of motion exercises These exercises warm up your muscles and joints and improve the movement and flexibility of your foot. These exercises also help to relieve pain. Exercise A: Plantar fascia stretch   1. Sit with your left / right leg crossed over your opposite knee. 2. Hold your heel with one hand with that thumb near your arch. With your other hand, hold your toes and gently pull them back toward the top of your foot. You should feel a stretch on the bottom of your toes or your foot or both. 3. Hold this stretch for__________ seconds. 4. Slowly release your toes and return to the starting position. Repeat __________ times. Complete this exercise __________ times a day. Exercise B: Gastroc,  standing   1. Stand with your hands against a wall. 2. Extend your left / right leg behind you, and bend your front knee slightly. 3. Keeping your heels on the floor and keeping your back knee straight, shift your weight toward the wall without arching your back. You should feel a gentle stretch in your left / right calf. 4. Hold this position for __________ seconds. Repeat __________ times. Complete this exercise __________ times a day. Exercise C: Soleus, standing  1. Stand with your hands against a wall. 2. Extend your left / right leg behind you, and bend your front knee slightly. 3. Keeping your heels on the floor, bend your back knee and slightly shift your weight over the back leg. You should feel a gentle stretch deep in your calf. 4. Hold this position for __________ seconds. Repeat __________ times. Complete this exercise __________ times a day. Exercise D: Gastrocsoleus, standing  1. Stand with the ball of your left / right foot on a step. The ball of your foot is on the walking surface, right under your toes. 2. Keep your other foot firmly on the same step. 3. Hold onto the wall or a railing for balance. 4. Slowly lift your other foot, allowing your body weight to press your heel down over the edge of the step. You should feel a stretch in your left / right calf. 5. Hold this position for __________ seconds. 6. Return both feet to the step. 7. Repeat this exercise with a slight bend in your left /  right knee. Repeat __________ times with your left / right knee straight and __________ times with your left / right knee bent. Complete this exercise __________ times a day. Balance exercise This exercise builds your balance and strength control of your arch to help take pressure off your plantar fascia. Exercise E: Single leg stand  1. Without shoes, stand near a railing or in a doorway. You may hold onto the railing or door frame as needed. 2. Stand on your left / right foot. Keep  your big toe down on the floor and try to keep your arch lifted. Do not let your foot roll inward. 3. Hold this position for __________ seconds. 4. If this exercise is too easy, you can try it with your eyes closed or while standing on a pillow. Repeat __________ times. Complete this exercise __________ times a day. This information is not intended to replace advice given to you by your health care provider. Make sure you discuss any questions you have with your health care provider. Document Released: 09/22/2005 Document Revised: 05/27/2016 Document Reviewed: 08/06/2015 Elsevier Interactive Patient Education  2017 [employer].

## 2017-01-21 NOTE — Progress Notes (Signed)
Subjective:    Patient ID: Tanya [name], female    DOB: 1981/04/11, 36 y.o.   MRN: [id number]  01/21/2017  Follow-up (patient states her feet hurt worse now); Medication check (patient is having no issues with the new dose of meds; sometimes dry mouth); and Immunizations (discussed with patient about Tdap)   HPI This 36 y.o. female presents for one month follow-up of SVT/tachycardia, dizziness, DOE, paresthesias in feet.  Feet are way worse since returning to work. After work, feet hurt really badly. Heel hurts really badly; top of L foot hurts really bad.  No swelling.  No unusual back pain; chronic back pain.  Tingling and numb.  Pain is in heels.  Bought inserts for tennis shoes without improvement.   Heel pain is the worst.  Shoes are comfortable; shoes are new.  Just started wearing sandals.  Walking barefooted at home; takes shoes off as soon as gets in car.   Increased iron to bid.   Taking Metoprolol 2 bid.  No more dizziness.  No further numbness in legs; that occurred only in hospital.  Sometimes hands also go numb; can shake off and goes away.  Has undergone carpal tunnel surgery B.   Has been taking anything; thought about Ibuprofen.    Scheduled for gastric sleeve.  Goal weight loss not sure; goal is 150 pounds.   Has been on Topamax for six years; Rondall Allegra at [employer].  Took forever to get to right dose.  Current dose for two years.  Review of Systems  Constitutional: Negative for chills, diaphoresis, fatigue and fever.  Eyes: Negative for visual disturbance.  Respiratory: Negative for cough and shortness of breath.   Cardiovascular: Negative for chest pain, palpitations and leg swelling.  Gastrointestinal: Negative for abdominal pain, constipation, diarrhea, nausea and vomiting.  Endocrine: Negative for cold intolerance, heat intolerance, polydipsia, polyphagia and polyuria.  Musculoskeletal: Positive for arthralgias, back pain and gait problem.  Neurological:  Positive for numbness. Negative for dizziness, tremors, seizures, syncope, facial asymmetry, speech difficulty, weakness, light-headedness and headaches.    Past Medical History:  Diagnosis Date  . Anemia   . Anxiety   . Arthritis    L knee OA  . Depression    history of depression in 12-25-10 with death of mother  . Hypertension   . Migraine    Past Surgical History:  Procedure Laterality Date  . CESAREAN SECTION    . CHOLECYSTECTOMY    . HAND SURGERY    . KNEE SURGERY    . LAPAROSCOPIC GASTRIC SLEEVE RESECTION N/A 02/17/2017   Procedure: LAPAROSCOPIC GASTRIC SLEEVE RESECTION, UPPER ENDO;  Surgeon: Johnathan Hausen, MD;  Location: [facility];  Service: General;  Laterality: N/A;  . LAPAROSCOPIC UNILATERAL SALPINGECTOMY Left 06/10/2014   Procedure: LAPAROSCOPIC Left SALPINGECTOMY, removal of ectopic pregnancy;  Surgeon: Guss Bunde, MD;  Location: [facility];  Service: Gynecology;  Laterality: Left;  . TUBAL LIGATION    . WISDOM TOOTH EXTRACTION     Allergies  Allergen Reactions  . Imitrex [Sumatriptan] Shortness Of Breath  . Zomig [Zolmitriptan] Shortness Of Breath  . Latex Hives and Swelling  . Sulfa Antibiotics Other (See Comments)    Unknown. Since childhood.     Social History   Social History  . Marital status: Married    Spouse name: N/A  . Number of children: 2  . Years of education: N/A   Occupational History  . nurse tech [employer]   Social History  Main Topics  . Smoking status: Never Smoker  . Smokeless tobacco: Never Used  . Alcohol use Yes     Comment: socially  . Drug use: No  . Sexual activity: Not on file   Other Topics Concern  . Not on file   Social History Narrative   Marital status: married x 6 years; second marriage      Children: 2 children (20, 73); 1 stepgrandaughter; 1 stepdaughter      Lives: with husband, one son      Employment: Chartered certified accountant since 2017; fifth floor surgical floor; third shift 3 twelve hour shifts       Tobacco: none      Alcohol: none      Drugs; none      Exercise: none   Family History  Problem Relation Age of Onset  . Breast cancer Mother 79  . Cancer Mother   . Prostate cancer Father 22  . Breast cancer Maternal Aunt        dx in her 76s; BRCA neg  . Prostate cancer Paternal Uncle   . Prostate cancer Paternal Grandfather   . Ovarian cancer Sister 38       found during her pregnancy  . Prostate cancer Paternal Uncle   . Prostate cancer Paternal Uncle   . Cancer Other   . Stroke Other   . Diabetes Other   . Coronary artery disease Other        Objective:    BP 116/78   Pulse 72   Temp 98 F (36.7 C) (Oral)   Resp 16   Ht '5\' 1"'  (1.549 m)   Wt 280 lb 9.6 oz (127.3 kg)   LMP 12/15/2016   SpO2 98%   BMI 53.02 kg/m  Physical Exam  Constitutional: She is oriented to person, place, and time. She appears well-developed and well-nourished. No distress.  HENT:  Head: Normocephalic and atraumatic.  Right Ear: External ear normal.  Left Ear: External ear normal.  Nose: Nose normal.  Mouth/Throat: Oropharynx is clear and moist.  Eyes: Conjunctivae and EOM are normal. Pupils are equal, round, and reactive to light.  Neck: Normal range of motion. Neck supple. Carotid bruit is not present. No thyromegaly present.  Cardiovascular: Normal rate, regular rhythm, normal heart sounds and intact distal pulses.  Exam reveals no gallop and no friction rub.   No murmur heard. Pulmonary/Chest: Effort normal and breath sounds normal. She has no wheezes. She has no rales.  Abdominal: Soft. Bowel sounds are normal. She exhibits no distension and no mass. There is no tenderness. There is no rebound and no guarding.  Musculoskeletal:       Right foot: There is tenderness and bony tenderness. There is normal range of motion, no swelling, normal capillary refill, no crepitus, no deformity and no laceration.       Left foot: There is tenderness and bony tenderness. There is normal range of  motion, no swelling, normal capillary refill, no crepitus, no deformity and no laceration.  Lymphadenopathy:    She has no cervical adenopathy.  Neurological: She is alert and oriented to person, place, and time. No cranial nerve deficit.  Skin: Skin is warm and dry. No rash noted. She is not diaphoretic. No erythema. No pallor.  Psychiatric: She has a normal mood and affect. Her behavior is normal.        Assessment & Plan:   1. Tachycardia   2. Tachypnea   3. Numbness in feet  4. Morbid obesity ([place])   5. Iron deficiency anemia due to chronic blood loss   6. Other vitamin B12 deficiency anemia   7. SVT (supraventricular tachycardia) ([place])   8. Chronic migraine without aura without status migrainosus, not intractable    -improved tachycardia and DOE with current dose of Metoprolol; no changes to management at this time. -moderate pain in B feet at end of shift; consistent with metatarsalgia versus plantar fasciitis; will be undergoing gastric sleeve in upcoming month; will have a few weeks off of work; consider referral to podiatry if pain persists Home exercise program provided; avoided walking barefoot at home. -continue iron supplement and vitamins in preparation for upcoming surgery. -paresthesias may be secondary to high dose Topamax.  Monitor closely.   No orders of the defined types were placed in this encounter.  No orders of the defined types were placed in this encounter.   Return in about 6 weeks (around 03/04/2017) for recheck.   Kristi Elayne Guerin, M.D. Primary Care at [facility] [address] [place], [place]  [address] [phone] phone [phone] fax

## 2017-01-27 NOTE — Progress Notes (Signed)
01-02-17 (EPIC) EKG 12-11-16 (EPIC) ECHO 11-21-16 (EPIC) CXR

## 2017-01-28 ENCOUNTER — Ambulatory Visit: Payer: Self-pay | Admitting: Surgery

## 2017-01-28 MED FILL — TOPIRAMATE 100 MG TABLET: 100 | 90 days supply | Qty: 360 | Fill #0

## 2017-01-28 NOTE — Patient Instructions (Addendum)
Tanya [name]  01/28/2017   Your procedure is scheduled on: 02-17-17  Report to [place]  elevators to 3rd floor to [place] at [phone].    Call this number if you have problems the morning of surgery [phone]    Remember: ONLY 1 PERSON MAY GO WITH YOU TO SHORT STAY TO GET  READY MORNING OF YOUR SURGERY.  Do not eat food or drink liquids :After Midnight.     Take these medicines the morning of surgery with A SIP OF WATER: Metoprolol (Lopresssor), Topomax (Topiramate)                                You may not have any metal on your body including hair pins and              piercings  Do not wear jewelry, make-up, lotions, powders or perfumes, deodorant             Do not wear nail polish.  Do not shave  48 hours prior to surgery.              Do not bring valuables to the hospital. [place].  Contacts, dentures or bridgework may not be worn into surgery.  Leave suitcase in the car. After surgery it may be brought to your room.     Special Instructions: Cough and Deep Breathing Exercises              Please read over the following fact sheets you were given: _____________________________________________________________________             [facility] - Preparing for Surgery Before surgery, you can play an important role.  Because skin is not sterile, your skin needs to be as free of germs as possible.  You can reduce the number of germs on your skin by washing with CHG (chlorahexidine gluconate) soap before surgery.  CHG is an antiseptic cleaner which kills germs and bonds with the skin to continue killing germs even after washing. Please DO NOT use if you have an allergy to CHG or antibacterial soaps.  If your skin becomes reddened/irritated stop using the CHG and inform your nurse when you arrive at Short Stay. Do not shave (including legs and underarms) for at  least 48 hours prior to the first CHG shower.  You may shave your face/neck. Please follow these instructions carefully:  1.  Shower with CHG Soap the night before surgery and the  morning of Surgery.  2.  If you choose to wash your hair, wash your hair first as usual with your  normal  shampoo.  3.  After you shampoo, rinse your hair and body thoroughly to remove the  shampoo.                           4.  Use CHG as you would any other liquid soap.  You can apply chg directly  to the skin and wash                       Gently with a scrungie or clean washcloth.  5.  Apply the  CHG Soap to your body ONLY FROM THE NECK DOWN.   Do not use on face/ open                           Wound or open sores. Avoid contact with eyes, ears mouth and genitals (private parts).                       Wash face,  Genitals (private parts) with your normal soap.             6.  Wash thoroughly, paying special attention to the area where your surgery  will be performed.  7.  Thoroughly rinse your body with warm water from the neck down.  8.  DO NOT shower/wash with your normal soap after using and rinsing off  the CHG Soap.                9.  Pat yourself dry with a clean towel.            10.  Wear clean pajamas.            11.  Place clean sheets on your bed the night of your first shower and do not  sleep with pets. Day of Surgery : Do not apply any lotions/deodorants the morning of surgery.  Please wear clean clothes to the hospital/surgery center.  FAILURE TO FOLLOW THESE INSTRUCTIONS MAY RESULT IN THE CANCELLATION OF YOUR SURGERY PATIENT SIGNATURE_________________________________  NURSE SIGNATURE__________________________________  ________________________________________________________________________

## 2017-01-30 ENCOUNTER — Encounter (HOSPITAL_COMMUNITY)
Admission: RE | Admit: 2017-01-30 | Discharge: 2017-01-30 | Disposition: A | Discharge status: Home / Self Care | Payer: 59 | Source: Ambulatory Visit | Attending: Surgery | Admitting: Surgery

## 2017-01-30 ENCOUNTER — Encounter (HOSPITAL_COMMUNITY): Payer: Self-pay

## 2017-01-30 DIAGNOSIS — Z01812 Encounter for preprocedural laboratory examination: Secondary | ICD-10-CM | POA: Diagnosis not present

## 2017-01-30 DIAGNOSIS — Z6841 Body Mass Index (BMI) 40.0 and over, adult: Secondary | ICD-10-CM | POA: Insufficient documentation

## 2017-01-30 HISTORY — DX: Essential (primary) hypertension: I10

## 2017-01-30 LAB — CBC WITH DIFFERENTIAL/PLATELET
BASOS ABS: 0 10*3/uL (ref 0.0–0.1)
BASOS PCT: 0 %
Eosinophils Absolute: 0.1 10*3/uL (ref 0.0–0.7)
Eosinophils Relative: 1 %
HEMATOCRIT: 41.1 % (ref 36.0–46.0)
Hemoglobin: 13.5 g/dL (ref 12.0–15.0)
Lymphocytes Relative: 19 %
Lymphs Abs: 2.2 10*3/uL (ref 0.7–4.0)
MCH: 25.2 pg — ABNORMAL LOW (ref 26.0–34.0)
MCHC: 32.8 g/dL (ref 30.0–36.0)
MCV: 76.8 fL — ABNORMAL LOW (ref 78.0–100.0)
MONO ABS: 0.7 10*3/uL (ref 0.1–1.0)
Monocytes Relative: 6 %
NEUTROS ABS: 8.1 10*3/uL — AB (ref 1.7–7.7)
Neutrophils Relative %: 74 %
PLATELETS: 381 10*3/uL (ref 150–400)
RBC: 5.35 MIL/uL — ABNORMAL HIGH (ref 3.87–5.11)
RDW: 18.9 % — AB (ref 11.5–15.5)
WBC: 11.1 10*3/uL — ABNORMAL HIGH (ref 4.0–10.5)

## 2017-01-30 LAB — COMPREHENSIVE METABOLIC PANEL
ALBUMIN: 4.1 g/dL (ref 3.5–5.0)
ALT: 30 U/L (ref 14–54)
AST: 28 U/L (ref 15–41)
Alkaline Phosphatase: 47 U/L (ref 38–126)
Anion gap: 8 (ref 5–15)
BILIRUBIN TOTAL: 0.8 mg/dL (ref 0.3–1.2)
BUN: 23 mg/dL — AB (ref 6–20)
CHLORIDE: 108 mmol/L (ref 101–111)
CO2: 22 mmol/L (ref 22–32)
Calcium: 9.3 mg/dL (ref 8.9–10.3)
Creatinine, Ser: 0.83 mg/dL (ref 0.44–1.00)
GFR calc Af Amer: >60 mL/min (ref >60)
GFR calc non Af Amer: >60 mL/min (ref >60)
GLUCOSE: 100 mg/dL — AB (ref 65–99)
POTASSIUM: 4.4 mmol/L (ref 3.5–5.1)
SODIUM: 138 mmol/L (ref 135–145)
Total Protein: 7.4 g/dL (ref 6.5–8.1)

## 2017-01-30 NOTE — Progress Notes (Signed)
Pt indicated at PAT visit that she is scheduled to start her pre-op diet for her surgical procedure on 02-03-17.

## 2017-02-01 DIAGNOSIS — D500 Iron deficiency anemia secondary to blood loss (chronic): Secondary | ICD-10-CM | POA: Insufficient documentation

## 2017-02-02 ENCOUNTER — Ambulatory Visit (INDEPENDENT_AMBULATORY_CARE_PROVIDER_SITE_OTHER): Payer: 59 | Admitting: Neurology

## 2017-02-02 DIAGNOSIS — G4720 Circadian rhythm sleep disorder, unspecified type: Secondary | ICD-10-CM

## 2017-02-02 DIAGNOSIS — G4730 Sleep apnea, unspecified: Secondary | ICD-10-CM

## 2017-02-02 DIAGNOSIS — R0683 Snoring: Secondary | ICD-10-CM

## 2017-02-03 DIAGNOSIS — E669 Obesity, unspecified: Secondary | ICD-10-CM

## 2017-02-03 HISTORY — DX: Obesity, unspecified: E66.9

## 2017-02-05 ENCOUNTER — Encounter: Payer: Self-pay | Admitting: Neurology

## 2017-02-05 NOTE — Procedures (Signed)
PATIENT'S NAME:  Tanya [name], Tanya [name] DOB:      1980/10/31      MR#:    [id number]     DATE OF RECORDING: 02/02/2017 REFERRING M.D.:  Fidela Salisbury, MD Study Performed:   Baseline Polysomnogram HISTORY: 36 year old woman with a history of anxiety, depression, osteoarthritis, migraine headaches, anemia and morbid obesity, who reports snoring and excessive daytime somnolence. She is in the process of being evaluated for bariatric surgery. Epworth Sleepiness Score isAw 14/24 points. The patient's weight 280 pounds with a height of 61 (inches), resulting in a BMI of 52.9 kg/m2. The patient's neck circumference measured 16.2 inches.  CURRENT MEDICATIONS: Tylenol, Prevpac, Flinstones, Iron, Antivert, Lopressor, Topamax.   PROCEDURE:  This is a multichannel digital polysomnogram utilizing the Somnostar 11.2 system.  Electrodes and sensors were applied and monitored per AASM Specifications.   EEG, EOG, Chin and Limb EMG, were sampled at 200 Hz.  ECG, Snore and Nasal Pressure, Thermal Airflow, Respiratory Effort, CPAP Flow and Pressure, Oximetry was sampled at 50 Hz. Digital video and audio were recorded.      BASELINE STUDY  Lights Out was at 21:56 and Lights On at 05:06.  Total recording time (TRT) was 430 minutes, with a total sleep time (TST) of  266.5 minutes.   The patient's sleep latency was 111 minutes, which is markedly delayed (she reported usually sleeping on her back).  REM latency was 70 minutes.  The sleep efficiency was 62. %, which is reduced.     SLEEP ARCHITECTURE: WASO (Wake after sleep onset) was 51 minutes with mild sleep fragmentation noted and one longer period of wakefulness.  There were 11 minutes in Stage N1, 179 minutes Stage N2, 51.5 minutes Stage N3 and 25 minutes in Stage REM.  The percentage of Stage N1 was 4.1%, Stage N2 was 67.2%, which is increased, Stage N3 was 19.3%, which is normal, and Stage R (REM sleep) was 9.4%, which is reduced.   The arousals were noted as: 7 were  spontaneous, 3 were associated with PLMs, 2 were associated with respiratory events.    Audio and video analysis did not show any abnormal or unusual movements, behaviors, phonations or vocalizations.  The patient took 2 bathroom breaks. Mild to moderate snoring was noted. The EKG was in keeping with normal sinus rhythm (NSR).  RESPIRATORY ANALYSIS:  There were a total of 13 respiratory events:  0 obstructive apneas, 0 central apneas and 0 mixed apneas with a total of 0 apneas and an apnea index (AI) of 0 /hour. There were 13 hypopneas with a hypopnea index of 2.9 /hour. The patient also had 0 respiratory event related arousals (RERAs).      The total APNEA/HYPOPNEA INDEX (AHI) was 2.9/hour and the total RESPIRATORY DISTURBANCE INDEX was 2.9 /hour.  5 events occurred in REM sleep and 16 events in NREM. The REM AHI was 12 /hour, versus a non-REM AHI of 2.. The patient spent 159 minutes of total sleep time in the supine position and 108 minutes in non-supine.. The supine AHI was 3.4 versus a non-supine AHI of 2.2.  OXYGEN SATURATION & C02:  The Wake baseline 02 saturation was 96%, with the lowest being 90%. Time spent below 89% saturation equaled 0 minutes.   PERIODIC LIMB MOVEMENTS: The patient had a total of 17 Periodic Limb Movements.  The Periodic Limb Movement (PLM) index was 3.8 and the PLM Arousal index was .7/hour.  Post-study, the patient indicated that sleep was worse than usual.  IMPRESSION:  1. Primary Snoring 2. Dysfunctions associated with sleep stages or arousal from sleep  RECOMMENDATIONS:  1. This study does not demonstrate any significant obstructive or central sleep disordered breathing. There was evidence of mild, REM sleep related OSA, for which CPAP therapy is not warranted; mild to moderate snoring was noted. Avoidance of the supine sleep position and weight loss will likely help the snoring and mild REM related OSA.  2. This study shows sleep fragmentation and abnormal  sleep stage percentages; these are nonspecific findings and per se do not signify an intrinsic sleep disorder or a cause for the patient's sleep-related symptoms. Causes include (but are not limited to) the first night effect of the sleep study, circadian rhythm disturbances, medication effect or an underlying mood disorder or medical problem.  3. The patient should be cautioned not to drive, work at heights, or operate dangerous or heavy equipment when tired or sleepy. Review and reiteration of good sleep hygiene measures should be pursued with any patient. 4. The patient will be offered a follow-up appointment at [facility] for discussion of the test results and further management strategies. The referring provider will be notified of the test results.  I certify that I have reviewed the entire raw data recording prior to the issuance of this report in accordance with the Standards of Accreditation of the American Academy of Sleep Medicine (AASM)    [name] Age, MD, PhD Diplomat, American Board of Psychiatry and Neurology (Neurology and Sleep Medicine)

## 2017-02-05 NOTE — Progress Notes (Signed)
Patient referred by Dr. [name], PCP, seen by me on 01/12/17, diagnostic PSG on 02/02/17.   Please call and notify the patient that the recent sleep study did not show any significant obstructive sleep apnea. There was evidence of mild, REM sleep related OSA, for which CPAP therapy is not warranted; mild to moderate snoring was noted. Avoidance of the supine sleep position and weight loss will likely help the snoring and mild REM related OSA. Please inform patient that we can go over the details of the study during a follow up appointment; we can also play it by ear. Also, route or fax report to PCP and referring MD, if other than PCP.  Once you have spoken to patient, you can close this encounter.   Thanks,  [name] Age, MD, PhD [facility])

## 2017-02-06 ENCOUNTER — Other Ambulatory Visit: Payer: Self-pay

## 2017-02-06 NOTE — Patient Outreach (Signed)
[facility]) Care Management  02/06/2017  Tanya [name] [birth year] [id number]   Subjective: none.  Objective: per chart review-patient with morbid obesity due to excess calories Scheduled for Laparoscopic Gastric Sleeve resection with upper endoscopy on 02/17/17.  Assessment:  Received [facility] Pre-surgical call referral on 02/05/17. Telephone call to patient's home / mobile number, no answer. HIPPA compliant voice message left. Pre-surgical call pending patient contact.   Plan: RNCM will call patient for 2nd telephone outreach attempt within the week if no return call.  Thea Silversmith, RN, MSN, Will Coordinator Cell: [phone]

## 2017-02-09 ENCOUNTER — Telehealth: Payer: Self-pay

## 2017-02-09 NOTE — Telephone Encounter (Signed)
-----   Message from [name] Age, MD sent at 02/05/2017  8:24 AM EDT ----- Patient referred by Dr. [name], PCP, seen by me on 01/12/17, diagnostic PSG on 02/02/17.   Please call and notify the patient that the recent sleep study did not show any significant obstructive sleep apnea. There was evidence of mild, REM sleep related OSA, for which CPAP therapy is not warranted; mild to moderate snoring was noted. Avoidance of the supine sleep position and weight loss will likely help the snoring and mild REM related OSA. Please inform patient that we can go over the details of the study during a follow up appointment; we can also play it by ear. Also, route or fax report to PCP and referring MD, if other than PCP.  Once you have spoken to patient, you can close this encounter.   Thanks,  [name] Age, MD, PhD [facility])

## 2017-02-09 NOTE — Telephone Encounter (Signed)
LM for patient to call back for results

## 2017-02-09 NOTE — Telephone Encounter (Signed)
Patient called back and I was able to give results and recommendations. She voiced understanding and did not want to make an appointment at this time. She is aware to call us back if needed. I have sent a copy to PCP.

## 2017-02-11 ENCOUNTER — Ambulatory Visit: Payer: Self-pay | Admitting: Surgery

## 2017-02-11 NOTE — H&P (Signed)
Chief Complaint:  Morbid obesity  History of Present Illness: The patient is a 36 year old female who presents for a bariatric surgery evaluation. 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 She knows a lot about postop sleeve patients and has decided that she wants a sleeve gastrectomy for weight loss. Her obesity began at age 64 after she was pregnant and gained 60 lbs with pregnancy. She has tried numerous diets and medical treatments including diet pills with limited success.   She denies GERD. She has had prior lap chole and single oopherectomy for tubal pregnancy. Her BMI is 52 and her weight is 286 lbs and she is 5'2".  Her primary is Dr. Reginia Forts.    She was hospitalized at [facility] for PAT-she says was related to her weight.  She has lost to 275 today.     Past Medical History:  Diagnosis Date  . Anemia   . Anxiety   . Arthritis    L knee OA  . Depression    history of depression in 20-Nov-2010 with death of mother  . Hypertension   . Migraine     Past Surgical History:  Procedure Laterality Date  . CESAREAN SECTION    . CHOLECYSTECTOMY    . HAND SURGERY    . KNEE SURGERY    . LAPAROSCOPIC UNILATERAL SALPINGECTOMY Left 06/10/2014   Procedure: LAPAROSCOPIC Left SALPINGECTOMY, removal of ectopic pregnancy;  Surgeon: Guss Bunde, MD;  Location: [facility];  Service: Gynecology;  Laterality: Left;  . TUBAL LIGATION    . WISDOM TOOTH EXTRACTION      Current Outpatient Prescriptions  Medication Sig Dispense Refill  . Biotin 10000 MCG TABS Take 1 tablet by mouth daily.    . Cyanocobalamin 1500 MCG TBCR Take 1,500 mcg by mouth daily.    . flintstones complete (FLINTSTONES) 60 MG chewable tablet Chew 2 tablets by mouth daily.    . IRON PO Take 65 mg by mouth 2 (two) times daily.     . meclizine (ANTIVERT) 25 MG tablet Take 1 tablet (25 mg total) by mouth 3 (three) times daily as needed for dizziness. (Patient not taking: Reported on 01/27/2017) 30 tablet 0  . metoprolol tartrate  (LOPRESSOR) 25 MG tablet Take 2 tablets (50 mg total) by mouth 2 (two) times daily. 360 tablet 1  . topiramate (TOPAMAX) 100 MG tablet Take 2 tablets (200 mg total) by mouth 2 (two) times daily. 360 tablet 1  . vitamin B-12 1000 MCG tablet Take 1 tablet (1,000 mcg total) by mouth daily. (Patient not taking: Reported on 01/30/2017) 30 tablet 1   Current Facility-Administered Medications  Medication Dose Route Frequency Provider Last Rate Last Dose  . cyanocobalamin ((VITAMIN B-12)) injection 1,000 mcg  1,000 mcg Intramuscular Q30 days Wardell Honour, MD   1,000 mcg at 01/02/17 0940   Imitrex [sumatriptan]; Zomig [zolmitriptan]; Latex; and Sulfa antibiotics Family History  Problem Relation Age of Onset  . Breast cancer Mother 32  . Cancer Mother   . Prostate cancer Father 30  . Breast cancer Maternal Aunt     dx in her 65s; BRCA neg  . Prostate cancer Paternal Uncle   . Prostate cancer Paternal Grandfather   . Ovarian cancer Sister 14    found during her pregnancy  . Prostate cancer Paternal Uncle   . Prostate cancer Paternal Uncle   . Cancer Other   . Stroke Other   . Diabetes Other   . Coronary  artery disease Other    Social History:   reports that she has never smoked. She has never used smokeless tobacco. She reports that she drinks alcohol. She reports that she does not use drugs.   REVIEW OF SYSTEMS : Negative except for see problem list  Physical Exam:   There were no vitals taken for this visit. There is no height or weight on file to calculate BMI.  Gen:  WDWN WF NAD  Neurological: Alert and oriented to person, place, and time. Motor and sensory function is grossly intact  Head: Normocephalic and atraumatic.  Eyes: Conjunctivae are normal. Pupils are equal, round, and reactive to light. No scleral icterus.  Neck: Normal range of motion. Neck supple. No tracheal deviation or thyromegaly present.  Cardiovascular:  SR without murmurs or gallops.  No carotid  bruits Breast:  Not examined Respiratory: Effort normal.  No respiratory distress. No chest wall tenderness. Breath sounds normal.  No wheezes, rales or rhonchi.  Abdomen:  nontender GU:  Not examined Musculoskeletal: Normal range of motion. Extremities are nontender. No cyanosis, edema or clubbing noted Lymphadenopathy: No cervical, preauricular, postauricular or axillary adenopathy is present Skin: Skin is warm and dry. No rash noted. No diaphoresis. No erythema. No pallor. Pscyh: Normal mood and affect. Behavior is normal. Judgment and thought content normal.   LABORATORY RESULTS: No results found for this or any previous visit (from the past 48 hour(s)).   RADIOLOGY RESULTS: No results found.  Problem List: Patient Active Problem List   Diagnosis Date Noted  . Iron deficiency anemia due to chronic blood loss 02/01/2017  . Tachycardia 12/10/2016  . Thrombocytosis ([place]) 12/10/2016  . Chest discomfort   . SOB (shortness of breath)   . SVT (supraventricular tachycardia) ([place])   . Tachypnea   . Numbness in feet 12/09/2016  . Vertigo 12/08/2016  . Migraine 12/08/2016  . Anemia due to vitamin B12 deficiency 12/08/2016  . Leukocytosis 12/08/2016  . Menometrorrhagia 12/05/2016  . Morbid obesity ([place]) 12/05/2016  . Chronic migraine without aura without status migrainosus, not intractable 12/05/2016  . Ruptured left tubal ectopic pregnancy causing hemoperitoneum 06/10/2014    Assessment & Plan: Morbid obesity-informed consent for sleeve gastrectomy for BMI > 50.      Matt B. Hassell Done, MD, [facility] [phone] beeper [phone]  02/11/2017 9:28 AM

## 2017-02-12 ENCOUNTER — Other Ambulatory Visit: Payer: Self-pay

## 2017-02-12 NOTE — Patient Outreach (Signed)
[place] [facility]) Care Management  02/12/2017  KAYLI BEAL 1981-02-06 [id number]  Subjective: none.  Objective: Per chart review, patient scheduled for Laparoscopic Gastric Sleeve resection on 02/17/17. History of migraine, vertigo, iron deficiency anemia, ventricular arythmia.  Asessment:  Received [facility] Pre-surgical call referral on 01/29/17. Telephone call to patient's mobile number. HIPPA compliant voice message left. Person answering home number indicated that RNCM was dialing the wrong number.  Pre-surgical call pending patient contact.   Plan: RNCM will call patient for 3rd telephone outreach attempt within the week if no return call.  Thea Silversmith, RN, MSN, [place] Coordinator Cell: [phone]

## 2017-02-13 ENCOUNTER — Other Ambulatory Visit: Payer: Self-pay

## 2017-02-13 MED FILL — oxyCODONE HCL 5 MG/5ML SOLN: 5 | 2 days supply | Qty: 120 | Fill #0

## 2017-02-13 MED FILL — PANTOPRAZOLE SOD DR 40 MG T: 40 | 30 days supply | Qty: 30 | Fill #0

## 2017-02-13 NOTE — Patient Outreach (Signed)
[place] [facility]) Care Management  02/13/2017  Tanya [name] Jan 31, 1981 [id number]  Subjective: none.  Objective: Per chart review, patient scheduled for Laparoscopic Gastric Sleeve resection on 02/17/17. History of migraine, vertigo, iron deficiency anemia, ventricular arythmia.  Asessment:  Received [facility] Pre-surgical call referral on 01/29/17. Telephone call to patient's mobile number. HIPPA compliant voice message left. Pre-surgical call pending patient contact.   This is the 3rd attempt to reach client.  Plan: Await return call. Writer will update RNCM for post procedure call.   Thea Silversmith, RN, MSN, [place] Coordinator Cell: [phone]

## 2017-02-16 MED FILL — ONDANSETRON ODT 4 MG TABLET: 4 | 5 days supply | Qty: 20 | Fill #0

## 2017-02-16 NOTE — Anesthesia Preprocedure Evaluation (Addendum)
Anesthesia Evaluation  Patient identified by MRN, date of birth, ID band Patient awake    Reviewed: Allergy & Precautions, H&P , NPO status , Patient's Chart, lab work & pertinent test results, reviewed documented beta blocker date and time   History of Anesthesia Complications Negative for: history of anesthetic complications  Airway Mallampati: II  TM Distance: >3 FB Neck ROM: Full    Dental no notable dental hx. (+) Dental Advisory Given   Pulmonary neg pulmonary ROS,    Pulmonary exam normal breath sounds clear to auscultation       Cardiovascular hypertension, Pt. on medications and Pt. on home beta blockers Normal cardiovascular exam Rhythm:Regular Rate:Normal     Neuro/Psych  Headaches, PSYCHIATRIC DISORDERS Anxiety Depression negative neurological ROS     GI/Hepatic negative GI ROS, Neg liver ROS,   Endo/Other  Hyperthyroidism Morbid obesity  Renal/GU negative Renal ROS  negative genitourinary   Musculoskeletal negative musculoskeletal ROS (+)   Abdominal   Peds negative pediatric ROS (+)  Hematology negative hematology ROS (+)   Anesthesia Other Findings   Reproductive/Obstetrics (+) Pregnancy                            Anesthesia Physical  Anesthesia Plan  ASA: III and emergent  Anesthesia Plan: General   Post-op Pain Management:    Induction: Intravenous  Airway Management Planned: Oral ETT  Additional Equipment:   Intra-op Plan:   Post-operative Plan: Extubation in OR  Informed Consent: I have reviewed the patients History and Physical, chart, labs and discussed the procedure including the risks, benefits and alternatives for the proposed anesthesia with the patient or authorized representative who has indicated his/her understanding and acceptance.   Dental advisory given  Plan Discussed with: Anesthesiologist and CRNA  Anesthesia Plan Comments: (ERAS  Protocol)       Anesthesia Quick Evaluation

## 2017-02-17 ENCOUNTER — Encounter (HOSPITAL_COMMUNITY)
Admission: RE | Disposition: A | Discharge status: Home / Self Care | Payer: Self-pay | Source: Ambulatory Visit | Attending: Surgery

## 2017-02-17 ENCOUNTER — Inpatient Hospital Stay (HOSPITAL_COMMUNITY): Payer: 59 | Admitting: Certified Registered Nurse Anesthetist

## 2017-02-17 ENCOUNTER — Inpatient Hospital Stay (HOSPITAL_COMMUNITY)
Admission: RE | Admit: 2017-02-17 | Discharge: 2017-02-20 | DRG: 621 | Disposition: A | Discharge status: Home / Self Care | Payer: 59 | Source: Ambulatory Visit | Attending: Surgery | Admitting: Surgery

## 2017-02-17 ENCOUNTER — Encounter (HOSPITAL_COMMUNITY): Payer: Self-pay | Admitting: *Deleted

## 2017-02-17 DIAGNOSIS — G43909 Migraine, unspecified, not intractable, without status migrainosus: Secondary | ICD-10-CM | POA: Diagnosis present

## 2017-02-17 DIAGNOSIS — I10 Essential (primary) hypertension: Secondary | ICD-10-CM | POA: Diagnosis present

## 2017-02-17 DIAGNOSIS — E0590 Thyrotoxicosis, unspecified without thyrotoxic crisis or storm: Secondary | ICD-10-CM | POA: Diagnosis present

## 2017-02-17 DIAGNOSIS — Z90721 Acquired absence of ovaries, unilateral: Secondary | ICD-10-CM | POA: Diagnosis not present

## 2017-02-17 DIAGNOSIS — Z91040 Latex allergy status: Secondary | ICD-10-CM

## 2017-02-17 DIAGNOSIS — M1712 Unilateral primary osteoarthritis, left knee: Secondary | ICD-10-CM | POA: Diagnosis present

## 2017-02-17 DIAGNOSIS — Z888 Allergy status to other drugs, medicaments and biological substances status: Secondary | ICD-10-CM | POA: Diagnosis not present

## 2017-02-17 DIAGNOSIS — Z79899 Other long term (current) drug therapy: Secondary | ICD-10-CM | POA: Diagnosis not present

## 2017-02-17 DIAGNOSIS — M549 Dorsalgia, unspecified: Secondary | ICD-10-CM | POA: Diagnosis not present

## 2017-02-17 DIAGNOSIS — Z6841 Body Mass Index (BMI) 40.0 and over, adult: Secondary | ICD-10-CM | POA: Diagnosis not present

## 2017-02-17 DIAGNOSIS — Z9884 Bariatric surgery status: Secondary | ICD-10-CM

## 2017-02-17 DIAGNOSIS — F329 Major depressive disorder, single episode, unspecified: Secondary | ICD-10-CM | POA: Diagnosis present

## 2017-02-17 DIAGNOSIS — M545 Low back pain: Secondary | ICD-10-CM | POA: Diagnosis present

## 2017-02-17 DIAGNOSIS — Z882 Allergy status to sulfonamides status: Secondary | ICD-10-CM

## 2017-02-17 DIAGNOSIS — R030 Elevated blood-pressure reading, without diagnosis of hypertension: Secondary | ICD-10-CM | POA: Diagnosis not present

## 2017-02-17 DIAGNOSIS — E785 Hyperlipidemia, unspecified: Secondary | ICD-10-CM | POA: Diagnosis not present

## 2017-02-17 DIAGNOSIS — F419 Anxiety disorder, unspecified: Secondary | ICD-10-CM | POA: Diagnosis present

## 2017-02-17 DIAGNOSIS — K2950 Unspecified chronic gastritis without bleeding: Secondary | ICD-10-CM | POA: Diagnosis not present

## 2017-02-17 HISTORY — PX: LAPAROSCOPIC GASTRIC SLEEVE RESECTION: SHX5895

## 2017-02-17 LAB — CBC
HCT: 36.8 % (ref 36.0–46.0)
Hemoglobin: 11.9 g/dL — ABNORMAL LOW (ref 12.0–15.0)
MCH: 25.2 pg — ABNORMAL LOW (ref 26.0–34.0)
MCHC: 32.3 g/dL (ref 30.0–36.0)
MCV: 78 fL (ref 78.0–100.0)
Platelets: 378 10*3/uL (ref 150–400)
RBC: 4.72 MIL/uL (ref 3.87–5.11)
RDW: 17.8 % — AB (ref 11.5–15.5)
WBC: 12.1 10*3/uL — ABNORMAL HIGH (ref 4.0–10.5)

## 2017-02-17 LAB — CREATININE, SERUM: CREATININE: 0.72 mg/dL (ref 0.44–1.00)

## 2017-02-17 LAB — PREGNANCY, URINE: Preg Test, Ur: NEGATIVE

## 2017-02-17 SURGERY — GASTRECTOMY, SLEEVE, LAPAROSCOPIC
Anesthesia: General

## 2017-02-17 MED ORDER — GABAPENTIN 300 MG PO CAPS
300.0000 mg | ORAL_CAPSULE | ORAL | Status: AC
  Administered 2017-02-17: 300 mg via ORAL
  Filled 2017-02-17: qty 1

## 2017-02-17 MED ORDER — SCOPOLAMINE 1 MG/3DAYS TD PT72
1.0000 | MEDICATED_PATCH | TRANSDERMAL | Status: DC
  Administered 2017-02-17: 1.5 mg via TRANSDERMAL
  Filled 2017-02-17: qty 1

## 2017-02-17 MED ORDER — KETAMINE HCL 10 MG/ML IJ SOLN
INTRAMUSCULAR | Status: AC
  Filled 2017-02-17: qty 1

## 2017-02-17 MED ORDER — DIPHENHYDRAMINE HCL 50 MG/ML IJ SOLN
INTRAMUSCULAR | Status: AC
  Filled 2017-02-17: qty 1

## 2017-02-17 MED ORDER — HEPARIN SODIUM (PORCINE) 5000 UNIT/ML IJ SOLN
5000.0000 [IU] | INTRAMUSCULAR | Status: AC
  Administered 2017-02-17: 5000 [IU] via SUBCUTANEOUS
  Filled 2017-02-17: qty 1

## 2017-02-17 MED ORDER — PROPOFOL 10 MG/ML IV BOLUS
INTRAVENOUS | Status: AC
  Filled 2017-02-17: qty 20

## 2017-02-17 MED ORDER — ACETAMINOPHEN 160 MG/5ML PO SOLN: 325.0000 mg | ORAL | Status: DC | PRN

## 2017-02-17 MED ORDER — DEXAMETHASONE SODIUM PHOSPHATE 10 MG/ML IJ SOLN
INTRAMUSCULAR | Status: AC
  Filled 2017-02-17: qty 1

## 2017-02-17 MED ORDER — ONDANSETRON HCL 4 MG/2ML IJ SOLN
INTRAMUSCULAR | Status: DC | PRN
  Administered 2017-02-17: 4 mg via INTRAVENOUS

## 2017-02-17 MED ORDER — FENTANYL CITRATE (PF) 250 MCG/5ML IJ SOLN
INTRAMUSCULAR | Status: AC
  Filled 2017-02-17: qty 5

## 2017-02-17 MED ORDER — CHLORHEXIDINE GLUCONATE 0.12 % MT SOLN
15.0000 mL | Freq: Two times a day (BID) | OROMUCOSAL | Status: DC
  Administered 2017-02-17 – 2017-02-19 (×4): 15 mL via OROMUCOSAL
  Filled 2017-02-17 (×4): qty 15

## 2017-02-17 MED ORDER — LIDOCAINE 2% (20 MG/ML) 5 ML SYRINGE
INTRAMUSCULAR | Status: DC | PRN
  Administered 2017-02-17: 1.5 mg/kg/h via INTRAVENOUS

## 2017-02-17 MED ORDER — PANTOPRAZOLE SODIUM 40 MG IV SOLR
40.0000 mg | Freq: Every day | INTRAVENOUS | Status: DC
  Administered 2017-02-17 – 2017-02-19 (×3): 40 mg via INTRAVENOUS
  Filled 2017-02-17 (×3): qty 40

## 2017-02-17 MED ORDER — DIPHENHYDRAMINE HCL 50 MG/ML IJ SOLN
INTRAMUSCULAR | Status: DC | PRN
  Administered 2017-02-17: 12.5 mg via INTRAVENOUS

## 2017-02-17 MED ORDER — DEXAMETHASONE SODIUM PHOSPHATE 4 MG/ML IJ SOLN
4.0000 mg | INTRAMUSCULAR | Status: AC
  Administered 2017-02-17 (×2): 10 mg via INTRAVENOUS

## 2017-02-17 MED ORDER — ROCURONIUM BROMIDE 50 MG/5ML IV SOSY
PREFILLED_SYRINGE | INTRAVENOUS | Status: DC | PRN
  Administered 2017-02-17: 10 mg via INTRAVENOUS
  Administered 2017-02-17: 80 mg via INTRAVENOUS
  Administered 2017-02-17: 10 mg via INTRAVENOUS

## 2017-02-17 MED ORDER — LACTATED RINGERS IV SOLN
INTRAVENOUS | Status: DC | PRN
  Administered 2017-02-17: 07:00:00 via INTRAVENOUS

## 2017-02-17 MED ORDER — OXYCODONE HCL 5 MG/5ML PO SOLN
5.0000 mg | ORAL | Status: DC | PRN
  Administered 2017-02-19 – 2017-02-20 (×3): 5 mg via ORAL
  Filled 2017-02-17 (×3): qty 5

## 2017-02-17 MED ORDER — KETAMINE HCL 10 MG/ML IJ SOLN
INTRAMUSCULAR | Status: DC | PRN
  Administered 2017-02-17: 25 mg via INTRAVENOUS

## 2017-02-17 MED ORDER — LIDOCAINE 2% (20 MG/ML) 5 ML SYRINGE
INTRAMUSCULAR | Status: AC
  Filled 2017-02-17: qty 5

## 2017-02-17 MED ORDER — CELECOXIB 200 MG PO CAPS
400.0000 mg | ORAL_CAPSULE | ORAL | Status: AC
  Administered 2017-02-17: 400 mg via ORAL
  Filled 2017-02-17: qty 2

## 2017-02-17 MED ORDER — ALBUMIN HUMAN 5 % IV SOLN
INTRAVENOUS | Status: AC
  Filled 2017-02-17: qty 250

## 2017-02-17 MED ORDER — ONDANSETRON HCL 4 MG/2ML IJ SOLN
4.0000 mg | INTRAMUSCULAR | Status: DC | PRN
  Administered 2017-02-17 – 2017-02-19 (×8): 4 mg via INTRAVENOUS
  Filled 2017-02-17 (×8): qty 2

## 2017-02-17 MED ORDER — ALBUMIN HUMAN 5 % IV SOLN
INTRAVENOUS | Status: DC | PRN
  Administered 2017-02-17: 09:00:00 via INTRAVENOUS

## 2017-02-17 MED ORDER — SUGAMMADEX SODIUM 200 MG/2ML IV SOLN
INTRAVENOUS | Status: DC | PRN
  Administered 2017-02-17: 500 mg via INTRAVENOUS

## 2017-02-17 MED ORDER — ORAL CARE MOUTH RINSE
15.0000 mL | Freq: Two times a day (BID) | OROMUCOSAL | Status: DC
  Administered 2017-02-17 (×2): 15 mL via OROMUCOSAL

## 2017-02-17 MED ORDER — PROPOFOL 10 MG/ML IV BOLUS
INTRAVENOUS | Status: DC | PRN
  Administered 2017-02-17: 200 mg via INTRAVENOUS

## 2017-02-17 MED ORDER — SODIUM CHLORIDE 0.9 % IJ SOLN
INTRAMUSCULAR | Status: AC
  Filled 2017-02-17: qty 10

## 2017-02-17 MED ORDER — CEFOTETAN DISODIUM-DEXTROSE 2-2.08 GM-% IV SOLR
INTRAVENOUS | Status: AC
  Filled 2017-02-17: qty 50

## 2017-02-17 MED ORDER — SUCCINYLCHOLINE CHLORIDE 200 MG/10ML IV SOSY
PREFILLED_SYRINGE | INTRAVENOUS | Status: AC
  Filled 2017-02-17: qty 10

## 2017-02-17 MED ORDER — EPHEDRINE 5 MG/ML INJ
INTRAVENOUS | Status: AC
  Filled 2017-02-17: qty 10

## 2017-02-17 MED ORDER — FENTANYL CITRATE (PF) 100 MCG/2ML IJ SOLN
INTRAMUSCULAR | Status: DC | PRN
  Administered 2017-02-17: 50 ug via INTRAVENOUS
  Administered 2017-02-17 (×2): 25 ug via INTRAVENOUS

## 2017-02-17 MED ORDER — LIDOCAINE 2% (20 MG/ML) 5 ML SYRINGE
INTRAMUSCULAR | Status: DC | PRN
  Administered 2017-02-17: 80 mg via INTRAVENOUS

## 2017-02-17 MED ORDER — LACTATED RINGERS IR SOLN
Status: DC | PRN
  Administered 2017-02-17: 1000 mL

## 2017-02-17 MED ORDER — ROCURONIUM BROMIDE 50 MG/5ML IV SOSY
PREFILLED_SYRINGE | INTRAVENOUS | Status: AC
  Filled 2017-02-17: qty 5

## 2017-02-17 MED ORDER — PHENYLEPHRINE 40 MCG/ML (10ML) SYRINGE FOR IV PUSH (FOR BLOOD PRESSURE SUPPORT)
PREFILLED_SYRINGE | INTRAVENOUS | Status: AC
  Filled 2017-02-17: qty 10

## 2017-02-17 MED ORDER — APREPITANT 40 MG PO CAPS
40.0000 mg | ORAL_CAPSULE | ORAL | Status: AC
  Administered 2017-02-17: 40 mg via ORAL
  Filled 2017-02-17: qty 1

## 2017-02-17 MED ORDER — HEPARIN SODIUM (PORCINE) 5000 UNIT/ML IJ SOLN
5000.0000 [IU] | Freq: Three times a day (TID) | INTRAMUSCULAR | Status: DC
  Administered 2017-02-17 – 2017-02-20 (×9): 5000 [IU] via SUBCUTANEOUS
  Filled 2017-02-17 (×9): qty 1

## 2017-02-17 MED ORDER — SUGAMMADEX SODIUM 500 MG/5ML IV SOLN
INTRAVENOUS | Status: AC
  Filled 2017-02-17: qty 5

## 2017-02-17 MED ORDER — BUPIVACAINE LIPOSOME 1.3 % IJ SUSP
20.0000 mL | Freq: Once | INTRAMUSCULAR | Status: AC
  Administered 2017-02-17: 20 mL
  Filled 2017-02-17: qty 20

## 2017-02-17 MED ORDER — PROMETHAZINE HCL 25 MG/ML IJ SOLN: 6.2500 mg | INTRAMUSCULAR | Status: DC | PRN

## 2017-02-17 MED ORDER — HYDROMORPHONE HCL 1 MG/ML IJ SOLN
0.2500 mg | INTRAMUSCULAR | Status: DC | PRN
  Administered 2017-02-17 (×2): 0.25 mg via INTRAVENOUS

## 2017-02-17 MED ORDER — KCL IN DEXTROSE-NACL 20-5-0.45 MEQ/L-%-% IV SOLN
INTRAVENOUS | Status: DC
  Administered 2017-02-17 – 2017-02-18 (×3): via INTRAVENOUS
  Administered 2017-02-18: 1000 mL via INTRAVENOUS
  Administered 2017-02-19 (×2): via INTRAVENOUS
  Administered 2017-02-20: 1000 mL via INTRAVENOUS
  Filled 2017-02-17 (×10): qty 1000

## 2017-02-17 MED ORDER — PHENYLEPHRINE 40 MCG/ML (10ML) SYRINGE FOR IV PUSH (FOR BLOOD PRESSURE SUPPORT)
PREFILLED_SYRINGE | INTRAVENOUS | Status: DC | PRN
  Administered 2017-02-17 (×5): 80 ug via INTRAVENOUS

## 2017-02-17 MED ORDER — SODIUM CHLORIDE 0.9 % IJ SOLN
INTRAMUSCULAR | Status: DC | PRN
  Administered 2017-02-17: 10 mL

## 2017-02-17 MED ORDER — CEFOTETAN DISODIUM-DEXTROSE 2-2.08 GM-% IV SOLR
2.0000 g | INTRAVENOUS | Status: AC
  Administered 2017-02-17: 2 g via INTRAVENOUS

## 2017-02-17 MED ORDER — MORPHINE SULFATE (PF) 4 MG/ML IV SOLN
1.0000 mg | INTRAVENOUS | Status: DC | PRN
  Administered 2017-02-17: 1 mg via INTRAVENOUS
  Administered 2017-02-17: 3 mg via INTRAVENOUS
  Administered 2017-02-17: 2 mg via INTRAVENOUS
  Filled 2017-02-17 (×3): qty 1

## 2017-02-17 MED ORDER — LACTATED RINGERS IV SOLN
INTRAVENOUS | Status: DC
  Administered 2017-02-17: 10:00:00 via INTRAVENOUS

## 2017-02-17 MED ORDER — PREMIER PROTEIN SHAKE
2.0000 [oz_av] | ORAL | Status: DC
  Administered 2017-02-18 – 2017-02-20 (×17): 2 [oz_av] via ORAL

## 2017-02-17 MED ORDER — HYDROMORPHONE HCL 1 MG/ML IJ SOLN
INTRAMUSCULAR | Status: AC
  Filled 2017-02-17: qty 0.5

## 2017-02-17 MED ORDER — MIDAZOLAM HCL 5 MG/5ML IJ SOLN
INTRAMUSCULAR | Status: DC | PRN
  Administered 2017-02-17: 2 mg via INTRAVENOUS

## 2017-02-17 MED ORDER — ACETAMINOPHEN 325 MG PO TABS: 650.0000 mg | ORAL_TABLET | ORAL | Status: DC | PRN

## 2017-02-17 MED ORDER — ACETAMINOPHEN 500 MG PO TABS
1000.0000 mg | ORAL_TABLET | ORAL | Status: AC
  Administered 2017-02-17: 1000 mg via ORAL
  Filled 2017-02-17: qty 2

## 2017-02-17 MED ORDER — MIDAZOLAM HCL 2 MG/2ML IJ SOLN
INTRAMUSCULAR | Status: AC
  Filled 2017-02-17: qty 2

## 2017-02-17 MED ORDER — HYDROMORPHONE HCL 1 MG/ML IJ SOLN
1.0000 mg | INTRAMUSCULAR | Status: DC | PRN
  Administered 2017-02-17 – 2017-02-19 (×7): 1 mg via INTRAVENOUS
  Filled 2017-02-17 (×6): qty 1

## 2017-02-17 MED ORDER — ONDANSETRON HCL 4 MG/2ML IJ SOLN
INTRAMUSCULAR | Status: AC
  Filled 2017-02-17: qty 2

## 2017-02-17 SURGICAL SUPPLY — 47 items
ADH SKN CLS APL DERMABOND .7 (GAUZE/BANDAGES/DRESSINGS) ×1
APPLICATOR COTTON TIP 6IN STRL (MISCELLANEOUS) IMPLANT
BAG SPEC RTRVL 10 TROC 200 (ENDOMECHANICALS) ×1
BLADE SURG 15 STRL LF DISP TIS (BLADE) ×1 IMPLANT
BLADE SURG 15 STRL SS (BLADE) ×3
CABLE HIGH FREQUENCY MONO STRZ (ELECTRODE) ×3 IMPLANT
DERMABOND ADVANCED (GAUZE/BANDAGES/DRESSINGS) ×2
DERMABOND ADVANCED .7 DNX12 (GAUZE/BANDAGES/DRESSINGS) IMPLANT
DEVICE TROCAR PUNCTURE CLOSURE (ENDOMECHANICALS) ×3 IMPLANT
ELECT REM PT RETURN 15FT ADLT (MISCELLANEOUS) ×3 IMPLANT
GLOVE BIOGEL M 8.0 STRL (GLOVE) ×3 IMPLANT
GOWN STRL REUS W/TWL XL LVL3 (GOWN DISPOSABLE) ×12 IMPLANT
HANDLE STAPLE EGIA 4 XL (STAPLE) ×3 IMPLANT
HOVERMATT SINGLE USE (MISCELLANEOUS) ×3 IMPLANT
KIT BASIN OR (CUSTOM PROCEDURE TRAY) ×3 IMPLANT
MARKER SKIN DUAL TIP RULER LAB (MISCELLANEOUS) ×3 IMPLANT
NDL SPNL 22GX3.5 QUINCKE BK (NEEDLE) ×1 IMPLANT
NEEDLE SPNL 22GX3.5 QUINCKE BK (NEEDLE) ×3 IMPLANT
PACK UNIVERSAL I (CUSTOM PROCEDURE TRAY) ×3 IMPLANT
POUCH RETRIEVAL ECOSAC 10 (ENDOMECHANICALS) IMPLANT
POUCH RETRIEVAL ECOSAC 10MM (ENDOMECHANICALS) ×2
RELOAD TRI 45 ART MED THCK BLK (STAPLE) ×3 IMPLANT
RELOAD TRI 60 ART MED THCK BLK (STAPLE) ×5 IMPLANT
RELOAD TRI 60 ART MED THCK PUR (STAPLE) ×6 IMPLANT
SCISSORS LAP 5X45 EPIX DISP (ENDOMECHANICALS) ×2 IMPLANT
SET IRRIG TUBING LAPAROSCOPIC (IRRIGATION / IRRIGATOR) ×3 IMPLANT
SHEARS HARMONIC ACE PLUS 45CM (MISCELLANEOUS) ×3 IMPLANT
SLEEVE ADV FIXATION 5X100MM (TROCAR) ×6 IMPLANT
SLEEVE GASTRECTOMY 36FR VISIGI (MISCELLANEOUS) ×3 IMPLANT
SOLUTION ANTI FOG 6CC (MISCELLANEOUS) ×3 IMPLANT
SPONGE LAP 18X18 X RAY DECT (DISPOSABLE) ×3 IMPLANT
STAPLER VISISTAT 35W (STAPLE) ×3 IMPLANT
SUT MNCRL AB 4-0 PS2 18 (SUTURE) ×2 IMPLANT
SUT VIC AB 4-0 SH 18 (SUTURE) ×3 IMPLANT
SUT VICRYL 0 TIES 12 18 (SUTURE) ×3 IMPLANT
SYR 10ML ECCENTRIC (SYRINGE) ×3 IMPLANT
SYR 20CC LL (SYRINGE) ×3 IMPLANT
SYR 50ML LL SCALE MARK (SYRINGE) ×3 IMPLANT
TOWEL OR 17X26 10 PK STRL BLUE (TOWEL DISPOSABLE) ×6 IMPLANT
TOWEL OR NON WOVEN STRL DISP B (DISPOSABLE) ×3 IMPLANT
TROCAR ADV FIXATION 5X100MM (TROCAR) ×3 IMPLANT
TROCAR BLADELESS 15MM (ENDOMECHANICALS) ×3 IMPLANT
TROCAR BLADELESS OPT 5 100 (ENDOMECHANICALS) ×3 IMPLANT
TUBING CONNECTING 10 (TUBING) ×2 IMPLANT
TUBING CONNECTING 10' (TUBING) ×1
TUBING ENDO SMARTCAP (MISCELLANEOUS) ×3 IMPLANT
TUBING INSUF HEATED (TUBING) ×3 IMPLANT

## 2017-02-17 NOTE — Transfer of Care (Signed)
Immediate Anesthesia Transfer of Care Note  Patient: Tanya [name]  Procedure(s) Performed: Procedure(s): LAPAROSCOPIC GASTRIC SLEEVE RESECTION, UPPER ENDO (N/A)  Patient Location: PACU  Anesthesia Type:General  Level of Consciousness: Patient easily awoken, sedated, comfortable, cooperative, following commands, responds to stimulation.   Airway & Oxygen Therapy: Patient spontaneously breathing, ventilating well, oxygen via simple oxygen mask.  Post-op Assessment: Report given to PACU RN, vital signs reviewed and stable, moving all extremities.   Post vital signs: Reviewed and stable.  Complications: No apparent anesthesia complications  Last Vitals:  Vitals:   02/17/17 0523  BP: 112/69  Pulse: 62  Resp: 16  Temp: 36.8 C    Last Pain:  Vitals:   02/17/17 0523  TempSrc: Oral      Patients Stated Pain Goal: 3 (94/07/68 0881)  Complications: No apparent anesthesia complications

## 2017-02-17 NOTE — Interval H&P Note (Signed)
History and Physical Interval Note:  02/17/2017 7:11 AM  Tanya [name]  has presented today for surgery, with the diagnosis of Morbid Obesity, HTN, Hyperlipidemia, Low Back Pain  The various methods of treatment have been discussed with the patient and family. After consideration of risks, benefits and other options for treatment, the patient has consented to  Procedure(s): LAPAROSCOPIC GASTRIC SLEEVE RESECTION, UPPER ENDO (N/A) as a surgical intervention .  The patient's history has been reviewed, patient examined, no change in status, stable for surgery.  I have reviewed the patient's chart and labs.  Questions were answered to the patient's satisfaction.     Elva Breaker B

## 2017-02-17 NOTE — Op Note (Signed)
Preoperative diagnosis: laparoscopic sleeve gastrectomy  Postoperative diagnosis: Same   Procedure: Upper endoscopy   Surgeon: Clovis Riley, M.D.  Anesthesia: Gen.   Indications for procedure: This patient was undergoing a laparoscopic sleeve gastrectomy.   Description of procedure: The endoscopy was placed in the mouth and into the oropharynx and under endoscopic vision it was advanced to the esophagogastric junction. The pouch was insufflated and no bleeding or bubbles were seen. The GEJ was identified at 42cm from the teeth.  No bleeding or leaks were detected. The scope was withdrawn without difficulty.   Clovis Riley, M.D. General, Bariatric, & Minimally Invasive Surgery [facility]

## 2017-02-17 NOTE — Op Note (Signed)
Surgeon: Kaylyn Lim, MD, FACS  Asst:  Romana Juniper, MD  Anes:  General endotracheal  Procedure: Laparoscopic sleeve gastrectomy and upper endoscopy  Diagnosis: Morbid obesity BMI > 50   Complications: None noted  EBL:   5 cc  Description of Procedure:  The patient was take to OR 2 and given general anesthesia.  The abdomen was prepped with PCMX and draped sterilely.  A timeout was performed.  Access to the abdomen was achieved with a 5 mm Optiview through the left upper quadrant without difficulty.  Following insufflation, the state of the abdomen was found to be free of adhesions.  The ViSiGi 36Fr tube was inserted to deflate the stomach and was pulled back into the esophagus.  No hiatal hernia defect was seen.    The pylorus was identified and we measured 5 cm back and marked the antrum.  At that point we began dissection to take down the greater curvature of the stomach using the Harmonic scalpel.  This dissection was taken all the way up to the left crus.  Posterior attachments of the stomach were also taken down.    The ViSiGi tube was then passed into the antrum and suction applied so that it was snug along the lessor curvature.  The "crow's foot" or incisura was identified.  The sleeve gastrectomy was begun using the [employer] stapler beginning with a 4.5 cm black load with TRS followed by a 6 cm black load with TRS followed by multiple firings of 6 cm purple loads with TRS.  When the sleeve was complete the tube was taken off suction and insufflated briefly.  The tube was withdrawn.  Upper endoscopy was then performed by Dr. Kae Heller.     The specimen was extracted through the 15 trocar site.  Wounds were infiltrated with Exparel and closed with 4-0 Monocryl.  The 15 mm trocar site was placed obliquely and was closed with a single 0 vicryl using the Endoclose.    Matt B. Hassell Done, [place], [facility], [place]

## 2017-02-17 NOTE — H&P (View-Only) (Signed)
Chief Complaint:  Morbid obesity  History of Present Illness: The patient is a 36 year old female who presents for a bariatric surgery evaluation. 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 Her obesity began at age 14 after she was pregnant and gained 60 lbs with pregnancy. She has tried numerous diets and medical treatments including diet pills with limited success.   She denies GERD. She has had prior lap chole and single oopherectomy for tubal pregnancy. Her BMI is 52 and her weight is 286 lbs and she is 5'2".  Her primary is Dr. Kristi Smith.    She was hospitalized at [employer] for PAT-she says was related to her weight.  She has lost to 275 today.     Past Medical History:  Diagnosis Date  . Anemia   . Anxiety   . Arthritis    L knee OA  . Depression    history of depression in 2012 with death of mother  . Hypertension   . Migraine     Past Surgical History:  Procedure Laterality Date  . CESAREAN SECTION    . CHOLECYSTECTOMY    . HAND SURGERY    . KNEE SURGERY    . LAPAROSCOPIC UNILATERAL SALPINGECTOMY Left 06/10/2014   Procedure: LAPAROSCOPIC Left SALPINGECTOMY, removal of ectopic pregnancy;  Surgeon: Kelly H Leggett, MD;  Location: [facility];  Service: Gynecology;  Laterality: Left;  . TUBAL LIGATION    . WISDOM TOOTH EXTRACTION      Current Outpatient Prescriptions  Medication Sig Dispense Refill  . Biotin 10000 MCG TABS Take 1 tablet by mouth daily.    . Cyanocobalamin 1500 MCG TBCR Take 1,500 mcg by mouth daily.    . flintstones complete (FLINTSTONES) 60 MG chewable tablet Chew 2 tablets by mouth daily.    . IRON PO Take 65 mg by mouth 2 (two) times daily.     . meclizine (ANTIVERT) 25 MG tablet Take 1 tablet (25 mg total) by mouth 3 (three) times daily as needed for dizziness. (Patient not taking: Reported on 01/27/2017) 30 tablet 0  . metoprolol tartrate  (LOPRESSOR) 25 MG tablet Take 2 tablets (50 mg total) by mouth 2 (two) times daily. 360 tablet 1  . topiramate (TOPAMAX) 100 MG tablet Take 2 tablets (200 mg total) by mouth 2 (two) times daily. 360 tablet 1  . vitamin B-12 1000 MCG tablet Take 1 tablet (1,000 mcg total) by mouth daily. (Patient not taking: Reported on 01/30/2017) 30 tablet 1   Current Facility-Administered Medications  Medication Dose Route Frequency Provider Last Rate Last Dose  . cyanocobalamin ((VITAMIN B-12)) injection 1,000 mcg  1,000 mcg Intramuscular Q30 days Smith, Kristi M, MD   1,000 mcg at 01/02/17 0940   Imitrex [sumatriptan]; Zomig [zolmitriptan]; Latex; and Sulfa antibiotics Family History  Problem Relation Age of Onset  . Breast cancer Mother 57  . Cancer Mother   . Prostate cancer Father 63  . Breast cancer Maternal Aunt     dx in her 40s; BRCA neg  . Prostate cancer Paternal Uncle   . Prostate cancer Paternal Grandfather   . Ovarian cancer Sister 27    found during her pregnancy  . Prostate cancer Paternal Uncle   . Prostate cancer Paternal Uncle   . Cancer Other   . Stroke Other   . Diabetes Other   . Coronary   artery disease Other    Social History:   reports that she has never smoked. She has never used smokeless tobacco. She reports that she drinks alcohol. She reports that she does not use drugs.   REVIEW OF SYSTEMS : Negative except for see problem list  Physical Exam:   There were no vitals taken for this visit. There is no height or weight on file to calculate BMI.  Gen:  WDWN WF NAD  Neurological: Alert and oriented to person, place, and time. Motor and sensory function is grossly intact  Head: Normocephalic and atraumatic.  Eyes: Conjunctivae are normal. Pupils are equal, round, and reactive to light. No scleral icterus.  Neck: Normal range of motion. Neck supple. No tracheal deviation or thyromegaly present.  Cardiovascular:  SR without murmurs or gallops.  No carotid  bruits Breast:  Not examined Respiratory: Effort normal.  No respiratory distress. No chest wall tenderness. Breath sounds normal.  No wheezes, rales or rhonchi.  Abdomen:  nontender GU:  Not examined Musculoskeletal: Normal range of motion. Extremities are nontender. No cyanosis, edema or clubbing noted Lymphadenopathy: No cervical, preauricular, postauricular or axillary adenopathy is present Skin: Skin is warm and dry. No rash noted. No diaphoresis. No erythema. No pallor. Pscyh: Normal mood and affect. Behavior is normal. Judgment and thought content normal.   LABORATORY RESULTS: No results found for this or any previous visit (from the past 48 hour(s)).   RADIOLOGY RESULTS: No results found.  Problem List: Patient Active Problem List   Diagnosis Date Noted  . Iron deficiency anemia due to chronic blood loss 02/01/2017  . Tachycardia 12/10/2016  . Thrombocytosis ([facility]) 12/10/2016  . Chest discomfort   . SOB (shortness of breath)   . SVT (supraventricular tachycardia) ([facility])   . Tachypnea   . Numbness in feet 12/09/2016  . Vertigo 12/08/2016  . Migraine 12/08/2016  . Anemia due to vitamin B12 deficiency 12/08/2016  . Leukocytosis 12/08/2016  . Menometrorrhagia 12/05/2016  . Morbid obesity ([facility]) 12/05/2016  . Chronic migraine without aura without status migrainosus, not intractable 12/05/2016  . Ruptured left tubal ectopic pregnancy causing hemoperitoneum 06/10/2014    Assessment & Plan: Morbid obesity-informed consent for sleeve gastrectomy for BMI > 50.      Matt B. Chucky Homes, MD, FACS  [facility] [phone] beeper [phone]  02/11/2017 9:28 AM     

## 2017-02-17 NOTE — Anesthesia Postprocedure Evaluation (Addendum)
Anesthesia Post Note  Patient: Tanya [name]  Procedure(s) Performed: Procedure(s) (LRB): LAPAROSCOPIC GASTRIC SLEEVE RESECTION, UPPER ENDO (N/A)  Patient location during evaluation: PACU Anesthesia Type: General Level of consciousness: sedated Pain management: pain level controlled Vital Signs Assessment: post-procedure vital signs reviewed and stable Respiratory status: spontaneous breathing and respiratory function stable Cardiovascular status: stable Anesthetic complications: no       Last Vitals:  Vitals:   02/17/17 1000 02/17/17 1045  BP: 131/81 130/74  Pulse: (!) 55 (!) 52  Resp: (!) 23 (!) 22  Temp:      Last Pain:  Vitals:   02/17/17 1045  TempSrc:   PainSc: Asleep                 Conard Alvira DANIEL

## 2017-02-17 NOTE — Anesthesia Procedure Notes (Signed)
Procedure Name: Intubation Date/Time: 02/17/2017 7:50 AM Performed by: Heide Scales C Patient Re-evaluated:Patient Re-evaluated prior to inductionOxygen Delivery Method: Circle system utilized Preoxygenation: Pre-oxygenation with 100% oxygen Intubation Type: IV induction Ventilation: Oral airway inserted - appropriate to patient size Laryngoscope Size: Mac and 3 Grade View: Grade II Tube type: Oral Tube size: 7.0 mm Airway Equipment and Method: Stylet Placement Confirmation: ETT inserted through vocal cords under direct vision,  positive ETCO2 and breath sounds checked- equal and bilateral Secured at: 22 cm Tube secured with: Tape Comments: Patient ramped with wedge ramp positioning device.

## 2017-02-17 NOTE — Discharge Instructions (Signed)

## 2017-02-18 LAB — CBC WITH DIFFERENTIAL/PLATELET
BASOS PCT: 0 %
Basophils Absolute: 0 10*3/uL (ref 0.0–0.1)
Eosinophils Absolute: 0 10*3/uL (ref 0.0–0.7)
Eosinophils Relative: 0 %
HEMATOCRIT: 37.6 % (ref 36.0–46.0)
Hemoglobin: 12 g/dL (ref 12.0–15.0)
LYMPHS PCT: 13 %
Lymphs Abs: 2.2 10*3/uL (ref 0.7–4.0)
MCH: 25.3 pg — ABNORMAL LOW (ref 26.0–34.0)
MCHC: 31.9 g/dL (ref 30.0–36.0)
MCV: 79.2 fL (ref 78.0–100.0)
MONO ABS: 1.2 10*3/uL — AB (ref 0.1–1.0)
MONOS PCT: 7 %
NEUTROS ABS: 12.9 10*3/uL — AB (ref 1.7–7.7)
NEUTROS PCT: 80 %
Platelets: 413 10*3/uL — ABNORMAL HIGH (ref 150–400)
RBC: 4.75 MIL/uL (ref 3.87–5.11)
RDW: 18.4 % — ABNORMAL HIGH (ref 11.5–15.5)
WBC: 16.3 10*3/uL — ABNORMAL HIGH (ref 4.0–10.5)

## 2017-02-18 MED ORDER — PROMETHAZINE HCL 25 MG/ML IJ SOLN
6.2500 mg | Freq: Four times a day (QID) | INTRAMUSCULAR | Status: DC | PRN
  Administered 2017-02-18: 6.25 mg via INTRAVENOUS
  Administered 2017-02-18 – 2017-02-19 (×2): 12.5 mg via INTRAVENOUS
  Administered 2017-02-19: 6.25 mg via INTRAVENOUS
  Administered 2017-02-19: 12.5 mg via INTRAVENOUS
  Administered 2017-02-19: 6.25 mg via INTRAVENOUS
  Administered 2017-02-20: 12.5 mg via INTRAVENOUS
  Filled 2017-02-18 (×7): qty 1

## 2017-02-18 NOTE — Progress Notes (Signed)
Patient ID: Tanya [name], female   DOB: 06-29-1981, 36 y.o.   MRN: [id number] [facility] Surgery Progress Note:   1 Day Post-Op  Subjective: Mental status is clear.  Having some nausea with water this morning Objective: Vital signs in last 24 hours: Temp:  [97.3 F (36.3 C)-98.2 F (36.8 C)] 98.1 F (36.7 C) (05/16 0059) Pulse Rate:  [50-79] 74 (05/16 0518) Resp:  [16-26] 18 (05/16 0518) BP: (109-153)/(57-84) 109/57 (05/16 0518) SpO2:  [95 %-100 %] 95 % (05/16 0518) Weight:  [123.8 kg (273 lb)] 123.8 kg (273 lb) (05/16 0514)  Intake/Output from previous day: 05/15 0701 - 05/16 0700 In: 2080 [P.O.:180; I.V.:1650; IV Piggyback:250] Out: 2375 [Urine:2350; Blood:25] Intake/Output this shift: Total I/O In: -  Out: 150 [Urine:150]  Physical Exam: Work of breathing is normal.  Sore   Lab Results:  Results for orders placed or performed during the hospital encounter of 02/17/17 (from the past 48 hour(s))  Pregnancy, urine     Status: None   Collection Time: 02/17/17  5:44 AM  Result Value Ref Range   Preg Test, Ur NEGATIVE NEGATIVE    Comment:        THE SENSITIVITY OF THIS METHODOLOGY IS >20 mIU/mL.   CBC     Status: Abnormal   Collection Time: 02/17/17  7:49 PM  Result Value Ref Range   WBC 12.1 (H) 4.0 - 10.5 K/uL   RBC 4.72 3.87 - 5.11 MIL/uL   Hemoglobin 11.9 (L) 12.0 - 15.0 g/dL   HCT 36.8 36.0 - 46.0 %   MCV 78.0 78.0 - 100.0 fL   MCH 25.2 (L) 26.0 - 34.0 pg   MCHC 32.3 30.0 - 36.0 g/dL   RDW 17.8 (H) 11.5 - 15.5 %   Platelets 378 150 - 400 K/uL  Creatinine, serum     Status: None   Collection Time: 02/17/17  7:49 PM  Result Value Ref Range   Creatinine, Ser 0.72 0.44 - 1.00 mg/dL   GFR calc non Af Amer >60 >60 mL/min   GFR calc Af Amer >60 >60 mL/min    Comment: (NOTE) The eGFR has been calculated using the CKD EPI equation. This calculation has not been validated in all clinical situations. eGFR's persistently <60 mL/min signify possible Chronic  Kidney Disease.   CBC WITH DIFFERENTIAL     Status: Abnormal   Collection Time: 02/18/17  4:54 AM  Result Value Ref Range   WBC 16.3 (H) 4.0 - 10.5 K/uL   RBC 4.75 3.87 - 5.11 MIL/uL   Hemoglobin 12.0 12.0 - 15.0 g/dL   HCT 37.6 36.0 - 46.0 %   MCV 79.2 78.0 - 100.0 fL   MCH 25.3 (L) 26.0 - 34.0 pg   MCHC 31.9 30.0 - 36.0 g/dL   RDW 18.4 (H) 11.5 - 15.5 %   Platelets 413 (H) 150 - 400 K/uL   Neutrophils Relative % 80 %   Neutro Abs 12.9 (H) 1.7 - 7.7 K/uL   Lymphocytes Relative 13 %   Lymphs Abs 2.2 0.7 - 4.0 K/uL   Monocytes Relative 7 %   Monocytes Absolute 1.2 (H) 0.1 - 1.0 K/uL   Eosinophils Relative 0 %   Eosinophils Absolute 0.0 0.0 - 0.7 K/uL   Basophils Relative 0 %   Basophils Absolute 0.0 0.0 - 0.1 K/uL    Radiology/Results: No results found.  Anti-infectives: Anti-infectives    Start     Dose/Rate Route Frequency Ordered Stop   02/17/17 [phone]  cefoTEtan in Dextrose 5% (CEFOTAN) 2-2.08 GM-% IVPB    Comments:  Virgia Land   : cabinet override      02/17/17 5672 02/17/17 0755   02/17/17 0544  cefoTEtan in Dextrose 5% (CEFOTAN) IVPB 2 g     2 g Intravenous On call to O.R. 02/17/17 0544 02/17/17 0755      Assessment/Plan: Problem List: Patient Active Problem List   Diagnosis Date Noted  . S/P laparoscopic sleeve gastrectomy May 2018 02/17/2017  . Iron deficiency anemia due to chronic blood loss 02/01/2017  . Tachycardia 12/10/2016  . Thrombocytosis ([place]) 12/10/2016  . Chest discomfort   . SOB (shortness of breath)   . SVT (supraventricular tachycardia) ([place])   . Tachypnea   . Numbness in feet 12/09/2016  . Vertigo 12/08/2016  . Migraine 12/08/2016  . Anemia due to vitamin B12 deficiency 12/08/2016  . Leukocytosis 12/08/2016  . Menometrorrhagia 12/05/2016  . Morbid obesity ([place]) 12/05/2016  . Chronic migraine without aura without status migrainosus, not intractable 12/05/2016  . Ruptured left tubal ectopic pregnancy causing hemoperitoneum 06/10/2014     Progress slower.  Will stay another day 1 Day Post-Op    LOS: 1 day   Matt B. Hassell Done, MD, [facility] [phone] beeper [phone]  02/18/2017 8:46 AM

## 2017-02-18 NOTE — Progress Notes (Signed)
Patient alert and oriented, Post op day 1.  Provided support and encouragement.  Encouraged pulmonary toilet, ambulation and small sips of liquids.  All questions answered.  Will continue to monitor. 

## 2017-02-18 NOTE — Progress Notes (Signed)
Patient with continued nausea.  Slow progression with clear fluids.  Phenergan ordered.  Patient mobilized with continued instruction on IS.  Husband at bedside

## 2017-02-18 NOTE — Plan of Care (Signed)
Problem: Food- and Nutrition-Related Knowledge Deficit (NB-1.1) Goal: Nutrition education Formal process to instruct or train a patient/client in a skill or to impart knowledge to help patients/clients voluntarily manage or modify food choices and eating behavior to maintain or improve health. Outcome: Completed/Met Date Met: 02/18/17 Nutrition Education Note  Received consult for diet education per DROP protocol.   Discussed 2 week post op diet with pt. Emphasized that liquids must be non carbonated, non caffeinated, and sugar free. Fluid goals discussed. Pt to follow up with outpatient bariatric RD for further diet progression after 2 weeks. Multivitamins and minerals also reviewed. Teach back method used, pt expressed understanding, expect good compliance.   Diet: First 2 Weeks  You will see the nutritionist about two (2) weeks after your surgery. The nutritionist will increase the types of foods you can eat if you are handling liquids well:  If you have severe vomiting or nausea and cannot handle clear liquids lasting longer than 1 day, call your surgeon  Protein Shake  Drink at least 2 ounces of shake 5-6 times per day  Each serving of protein shakes (usually 8 - 12 ounces) should have a minimum of:  15 grams of protein  And no more than 5 grams of carbohydrate  Goal for protein each day:  Men = 80 grams per day  Women = 60 grams per day  Protein powder may be added to fluids such as non-fat milk or Lactaid milk or Soy milk (limit to 35 grams added protein powder per serving)   Hydration  Slowly increase the amount of water and other clear liquids as tolerated (See Acceptable Fluids)  Slowly increase the amount of protein shake as tolerated  Sip fluids slowly and throughout the day  May use sugar substitutes in small amounts (no more than 6 - 8 packets per day; i.e. Splenda)   Fluid Goal  The first goal is to drink at least 8 ounces of protein shake/drink per day (or as directed  by the nutritionist); some examples of protein shakes are Premier Protein, Johnson [employer] Johnson, AMR Corporation, EAS Edge HP, and Unjury. See handout from pre-op Bariatric Education Class:  Slowly increase the amount of protein shake you drink as tolerated  You may find it easier to slowly sip shakes throughout the day  It is important to get your proteins in first  Your fluid goal is to drink 64 - 100 ounces of fluid daily  It may take a few weeks to build up to this  32 oz (or more) should be clear liquids  And  32 oz (or more) should be full liquids (see below for examples)  Liquids should not contain sugar, caffeine, or carbonation   Clear Liquids:  Water or Sugar-free flavored water (i.e. Fruit H2O, Propel)  Decaffeinated coffee or tea (sugar-free)  Crystal Lite, Wyler's Lite, Minute Maid Lite  Sugar-free Jell-O  Bouillon or broth  Sugar-free Popsicle: *Less than 20 calories each; Limit 1 per day   Full Liquids:  Protein Shakes/Drinks + 2 choices per day of other full liquids  Full liquids must be:  No More Than 12 grams of Carbs per serving  No More Than 3 grams of Fat per serving  Strained low-fat cream soup  Non-Fat milk  Fat-free Lactaid Milk  Sugar-free yogurt (Dannon Lite & Fit, [place] yogurt, Oikos Zero)   Clayton Bibles, MS, RD, LDN Pager: [phone] After Hours Pager: [phone]

## 2017-02-18 NOTE — Progress Notes (Signed)
Patient reached 12 ounces of clear liquid fluid with help of antiemetics.  Still vague nausea. Vital signs stable.  Patient offered chicken soup unjury after last antiemetic. Discussed patient progress with bedside RN.

## 2017-02-19 LAB — CBC WITH DIFFERENTIAL/PLATELET
Basophils Absolute: 0 10*3/uL (ref 0.0–0.1)
Basophils Relative: 0 %
EOS ABS: 0.1 10*3/uL (ref 0.0–0.7)
Eosinophils Relative: 1 %
HEMATOCRIT: 37.3 % (ref 36.0–46.0)
HEMOGLOBIN: 11.7 g/dL — AB (ref 12.0–15.0)
LYMPHS ABS: 2.1 10*3/uL (ref 0.7–4.0)
Lymphocytes Relative: 20 %
MCH: 25 pg — AB (ref 26.0–34.0)
MCHC: 31.4 g/dL (ref 30.0–36.0)
MCV: 79.7 fL (ref 78.0–100.0)
Monocytes Absolute: 0.7 10*3/uL (ref 0.1–1.0)
Monocytes Relative: 6 %
Neutro Abs: 7.9 10*3/uL — ABNORMAL HIGH (ref 1.7–7.7)
Neutrophils Relative %: 73 %
Platelets: 355 10*3/uL (ref 150–400)
RBC: 4.68 MIL/uL (ref 3.87–5.11)
RDW: 18.4 % — ABNORMAL HIGH (ref 11.5–15.5)
WBC: 10.7 10*3/uL — ABNORMAL HIGH (ref 4.0–10.5)

## 2017-02-19 MED ORDER — THIAMINE HCL 100 MG/ML IJ SOLN
Freq: Once | INTRAVENOUS | Status: AC
  Administered 2017-02-19: 10:00:00 via INTRAVENOUS
  Filled 2017-02-19: qty 1000

## 2017-02-19 NOTE — Consult Note (Signed)
   [facility] CM Inpatient Consult   02/19/2017  Tanya [name] 1981/03/07 [id number]     Came to visit [name] on behalf of [place] to Wellness program for [employer] employees/dependents with [facility] insurance.   She was not feeling well upon writer's visit. Therefore, encounter was brief.   Discussed post hospital discharge call. She is agreeable. [name] is familiar with [employer] to Wellness program. Denies having any [employer] to Wellness needs.    Marthenia Rolling, MSN-Ed, RN,BSN [facility] Liaison [phone]

## 2017-02-19 NOTE — Progress Notes (Signed)
Patient alert and oriented, Post op day 2.  Provided support and encouragement.  Encouraged pulmonary toilet, ambulation and small sips of liquids. Changed to unjury with water to see if changes with nausea. All questions answered.  Will continue to monitor.

## 2017-02-19 NOTE — Progress Notes (Signed)
Tolerating protein shakes with antiemetics. Ambulating in hallway. Pain controlled.  Increased nausea  Continues to be issue  with movement.

## 2017-02-19 NOTE — Progress Notes (Signed)
Patient ID: Tanya [name], female   DOB: May 31, 1981, 36 y.o.   MRN: [id number] [facility] Surgery Progress Note:   2 Days Post-Op  Subjective: Mental status is clear.  Remains very nauseated despite phenergan and zofran Objective: Vital signs in last 24 hours: Temp:  [97.8 F (36.6 C)-99.2 F (37.3 C)] 99.2 F (37.3 C) (05/17 0529) Pulse Rate:  [55-75] 62 (05/17 0529) Resp:  [16-18] 16 (05/17 0529) BP: (123-152)/(71-78) 148/72 (05/17 0529) SpO2:  [97 %-100 %] 100 % (05/17 0529) Weight:  [129.4 kg (285 lb 3.2 oz)] 129.4 kg (285 lb 3.2 oz) (05/17 0529)  Intake/Output from previous day: 05/16 0701 - 05/17 0700 In: 2395 [P.O.:240; I.V.:2155] Out: 1750 [Urine:1750] Intake/Output this shift: Total I/O In: -  Out: 600 [Urine:600]  Physical Exam: Work of breathing is not labored; VSS;  Nausea increased with movement  Lab Results:  Results for orders placed or performed during the hospital encounter of 02/17/17 (from the past 48 hour(s))  CBC     Status: Abnormal   Collection Time: 02/17/17  7:49 PM  Result Value Ref Range   WBC 12.1 (H) 4.0 - 10.5 K/uL   RBC 4.72 3.87 - 5.11 MIL/uL   Hemoglobin 11.9 (L) 12.0 - 15.0 g/dL   HCT 36.8 36.0 - 46.0 %   MCV 78.0 78.0 - 100.0 fL   MCH 25.2 (L) 26.0 - 34.0 pg   MCHC 32.3 30.0 - 36.0 g/dL   RDW 17.8 (H) 11.5 - 15.5 %   Platelets 378 150 - 400 K/uL  Creatinine, serum     Status: None   Collection Time: 02/17/17  7:49 PM  Result Value Ref Range   Creatinine, Ser 0.72 0.44 - 1.00 mg/dL   GFR calc non Af Amer >60 >60 mL/min   GFR calc Af Amer >60 >60 mL/min    Comment: (NOTE) The eGFR has been calculated using the CKD EPI equation. This calculation has not been validated in all clinical situations. eGFR's persistently <60 mL/min signify possible Chronic Kidney Disease.   CBC WITH DIFFERENTIAL     Status: Abnormal   Collection Time: 02/18/17  4:54 AM  Result Value Ref Range   WBC 16.3 (H) 4.0 - 10.5 K/uL   RBC 4.75 3.87 -  5.11 MIL/uL   Hemoglobin 12.0 12.0 - 15.0 g/dL   HCT 37.6 36.0 - 46.0 %   MCV 79.2 78.0 - 100.0 fL   MCH 25.3 (L) 26.0 - 34.0 pg   MCHC 31.9 30.0 - 36.0 g/dL   RDW 18.4 (H) 11.5 - 15.5 %   Platelets 413 (H) 150 - 400 K/uL   Neutrophils Relative % 80 %   Neutro Abs 12.9 (H) 1.7 - 7.7 K/uL   Lymphocytes Relative 13 %   Lymphs Abs 2.2 0.7 - 4.0 K/uL   Monocytes Relative 7 %   Monocytes Absolute 1.2 (H) 0.1 - 1.0 K/uL   Eosinophils Relative 0 %   Eosinophils Absolute 0.0 0.0 - 0.7 K/uL   Basophils Relative 0 %   Basophils Absolute 0.0 0.0 - 0.1 K/uL  CBC with Differential     Status: Abnormal   Collection Time: 02/19/17  4:39 AM  Result Value Ref Range   WBC 10.7 (H) 4.0 - 10.5 K/uL   RBC 4.68 3.87 - 5.11 MIL/uL   Hemoglobin 11.7 (L) 12.0 - 15.0 g/dL   HCT 37.3 36.0 - 46.0 %   MCV 79.7 78.0 - 100.0 fL   MCH 25.0 (L) 26.0 -  34.0 pg   MCHC 31.4 30.0 - 36.0 g/dL   RDW 18.4 (H) 11.5 - 15.5 %   Platelets 355 150 - 400 K/uL   Neutrophils Relative % 73 %   Neutro Abs 7.9 (H) 1.7 - 7.7 K/uL   Lymphocytes Relative 20 %   Lymphs Abs 2.1 0.7 - 4.0 K/uL   Monocytes Relative 6 %   Monocytes Absolute 0.7 0.1 - 1.0 K/uL   Eosinophils Relative 1 %   Eosinophils Absolute 0.1 0.0 - 0.7 K/uL   Basophils Relative 0 %   Basophils Absolute 0.0 0.0 - 0.1 K/uL    Radiology/Results: No results found.  Anti-infectives: Anti-infectives    Start     Dose/Rate Route Frequency Ordered Stop   02/17/17 0649  cefoTEtan in Dextrose 5% (CEFOTAN) 2-2.08 GM-% IVPB    Comments:  Virgia Land   : cabinet override      02/17/17 7900 02/17/17 0755   02/17/17 0544  cefoTEtan in Dextrose 5% (CEFOTAN) IVPB 2 g     2 g Intravenous On call to O.R. 02/17/17 0544 02/17/17 0755      Assessment/Plan: Problem List: Patient Active Problem List   Diagnosis Date Noted  . S/P laparoscopic sleeve gastrectomy May 2018 02/17/2017  . Iron deficiency anemia due to chronic blood loss 02/01/2017  . Tachycardia  12/10/2016  . Thrombocytosis ([place]) 12/10/2016  . Chest discomfort   . SOB (shortness of breath)   . SVT (supraventricular tachycardia) ([place])   . Tachypnea   . Numbness in feet 12/09/2016  . Vertigo 12/08/2016  . Migraine 12/08/2016  . Anemia due to vitamin B12 deficiency 12/08/2016  . Leukocytosis 12/08/2016  . Menometrorrhagia 12/05/2016  . Morbid obesity ([place]) 12/05/2016  . Chronic migraine without aura without status migrainosus, not intractable 12/05/2016  . Ruptured left tubal ectopic pregnancy causing hemoperitoneum 06/10/2014    WBC decreased today.  Nausea remains.  Continue IV support.  Doubt able to go home today.   2 Days Post-Op    LOS: 2 days   Matt B. Hassell Done, MD, [facility] [phone] beeper [phone]  02/19/2017 8:15 AM

## 2017-02-20 NOTE — Progress Notes (Signed)
Discharge instructions reviewed with patient, patient's vital signs are stable, incision is within normal limits, patient is tolerating her bariatric shakes and is up ambulating around the station, passing flatus, questions and concerns answered Neta Mends 3:51 PM 02-20-2017

## 2017-02-20 NOTE — Discharge Summary (Signed)
Physician Discharge Summary  Patient ID: Tanya [name] MRN: [id number] DOB/AGE: [birth year] 36 y.o.  Admit date: 02/17/2017 Discharge date: 02/20/2017  Admission Diagnoses:  Morbid obesity   Discharge Diagnoses:  same  Principal Problem:   S/P laparoscopic sleeve gastrectomy May 2018   Surgery:  Laparoscopic sleeve gastrectomy   Discharged Condition: improved  Hospital Course:   Had surgery.  PD 1 was nauseated with movement.  Pd2 was slow to take full liquids and nausea getting better.  Ready for discharge on PD 3.    Consults: none  Significant Diagnostic Studies: none    Discharge Exam: Blood pressure (!) 158/95, pulse 63, temperature 98.7 F (37.1 C), temperature source Oral, resp. rate 18, height 5' 1.5" (1.562 m), weight 129.4 kg (285 lb 3.2 oz), last menstrual period 02/10/2017, SpO2 100 %. Incisions OK  Disposition: 01-Home or Self Care  Discharge Instructions    AMB Referral to [place] Management    Complete by:  As directed    Please assign UMR member for post discharge call. Currently at [facility]. Thanks and please call with questions. Marthenia Rolling, [place], RN,BSN-THN [facility] Liaison-[phone]   Reason for consult:  Please assign UMR member for post discharge call   Expected date of contact:  1-3 days (reserved for hospital discharges)   Ambulate hourly while awake    Complete by:  As directed    Call MD for:  difficulty breathing, headache or visual disturbances    Complete by:  As directed    Call MD for:  persistant dizziness or light-headedness    Complete by:  As directed    Call MD for:  persistant nausea and vomiting    Complete by:  As directed    Call MD for:  redness, tenderness, or signs of infection (pain, swelling, redness, odor or green/yellow discharge around incision site)    Complete by:  As directed    Call MD for:  severe uncontrolled pain    Complete by:  As directed    Call MD for:  temperature >101 F    Complete  by:  As directed    Diet bariatric full liquid    Complete by:  As directed    Incentive spirometry    Complete by:  As directed    Perform hourly while awake     Allergies as of 02/20/2017      Reactions   Imitrex [sumatriptan] Shortness Of Breath   Zomig [zolmitriptan] Shortness Of Breath   Latex Hives, Swelling   Sulfa Antibiotics Other (See Comments)   Unknown. Since childhood.       Medication List    TAKE these medications   Biotin 10000 MCG Tabs Take 1 tablet by mouth daily.   Cyanocobalamin 1500 MCG Tbcr Take 1,500 mcg by mouth daily.   cyanocobalamin 1000 MCG tablet Take 1 tablet (1,000 mcg total) by mouth daily.   flintstones complete 60 MG chewable tablet Chew 2 tablets by mouth daily.   IRON PO Take 65 mg by mouth 2 (two) times daily.   meclizine 25 MG tablet Commonly known as:  ANTIVERT Take 1 tablet (25 mg total) by mouth 3 (three) times daily as needed for dizziness.   metoprolol tartrate 25 MG tablet Commonly known as:  LOPRESSOR Take 2 tablets (50 mg total) by mouth 2 (two) times daily. Notes to patient:  Monitor Blood Pressure Daily and keep a log for primary care physician.  You may need to make changes  to your medications with rapid weight loss.     topiramate 100 MG tablet Commonly known as:  TOPAMAX Take 2 tablets (200 mg total) by mouth 2 (two) times daily.      Follow-up Information    Johnathan Hausen, MD. Go on 03/11/2017.   Specialty:  General Surgery Why:  at [address] information: [address] [place] [address] [phone]        Johnathan Hausen, MD Follow up.   Specialty:  General Surgery Contact information: [place] [address] [phone]           Signed: Pedro Earls 02/20/2017, 10:03 AM

## 2017-02-20 NOTE — Progress Notes (Signed)
Patient alert and oriented, pain is controlled. Patient is tolerating fluids, advanced to protein shake today, patient is tolerating well.  Reviewed Gastric sleeve discharge instructions with patient and patient is able to articulate understanding.  Provided information on BELT program, Support Group and [facility] outpatient pharmacy. All questions answered, will continue to monitor.  

## 2017-02-23 ENCOUNTER — Other Ambulatory Visit: Payer: Self-pay

## 2017-02-23 NOTE — Patient Outreach (Signed)
[facility]) Care Management  02/23/2017  Tanya [name] [birth year] [id number]   Transition of care call attempt.  Member had laparoscopic sleeve gastrectomy on 02/17/17 and was discharged on 02/20/17. No answer and message left. RNCM will attempt to contact member tomorrow 02/24/17 if call not returned. Peter Garter RN, [facility] Care Management Coordinator-Link to [place] Management [phone]

## 2017-02-24 ENCOUNTER — Other Ambulatory Visit: Payer: Self-pay | Admitting: *Deleted

## 2017-02-24 NOTE — Patient Outreach (Addendum)
[place] [facility]) Care Management  02/24/2017  Tanya [name] 02-21-1981 [id number]   Subjective: Telephone call to patient's home  / mobile number, no answer, left HIPAA compliant voicemail message, and requested call back.   Objective: Per chart review, patient hospitalized 02/17/17 -02/20/17 for morbid obesity.    Status post  Laparoscopic sleeve gastrectomy and upper endoscopy on 02/17/17. Patient also hospitalized 12/08/16 - 12/12/16 for Vertigo and SVT (supraventricular tachycardia).   [facility] Care Management attempted preoperative call times 3 and last attempt on 02/13/17.    [facility] Care Management completed transition on care follow up call on 12/16/16.    Assessment: Received UMR Transition of care referral on 02/20/17.  Transition of care follow up pending patient contact.   Plan: RNCM will call patient for 3rd telephone outreach attempt, transition of care follow up, within 10 business days if no return call.   Tanya [name] H. Annia Friendly, BSN, [place] Management [facility] Telephonic CM Phone: [phone] Fax: [phone]

## 2017-02-25 ENCOUNTER — Other Ambulatory Visit: Payer: Self-pay | Admitting: *Deleted

## 2017-02-25 ENCOUNTER — Encounter: Payer: Self-pay | Admitting: *Deleted

## 2017-02-25 NOTE — Patient Outreach (Addendum)
[place] [facility]) Care Management  02/25/2017  Tanya [name] 03-11-1981 [id number]   Subjective: Telephone call to patient's home  / mobile number, no answer, left HIPAA compliant voicemail message, and requested call back. Telephone call to patient's alternate home phone number [phone]), female answering phone states this is a business phone,  Ms. Carvey does not own this phone, and  requested no return call.   Request sent to [facility] [place] Management to remove the following contact number [phone]) from patient's demographics.   Objective: Per chart review, patient hospitalized 02/17/17 -02/20/17 for morbid obesity.    Status post Laparoscopic sleeve gastrectomy and upper endoscopy on 02/17/17. Patient also hospitalized 12/08/16 - 12/12/16 for Vertigo and SVT (supraventricular tachycardia).   [facility] Care Management attempted preoperative call times 3 and last attempt on 02/13/17.    [facility] Care Management completed transition on care follow up call on 12/16/16.    Assessment: Received UMR Transition of care referral on 02/20/17. Transition of care follow up pending patient contact.   Plan: RNCM will send unsuccessful outreach  letter, [facility] pamphlet, and proceed with case closure, within 10 business days if no return call.   Emsley Custer H. Annia Friendly, BSN, [place] Management [facility] Telephonic CM Phone: [phone] Fax: [phone]

## 2017-02-26 ENCOUNTER — Telehealth (HOSPITAL_COMMUNITY): Payer: Self-pay

## 2017-02-26 NOTE — Telephone Encounter (Signed)
  Left voicemail with contact information provided.  Unable to answer following questions at this time   Made discharge phone call to patient. Asking the following questions.    1. Do you have someone to care for you now that you are home?   2. Are you having pain now that is not relieved by your pain medication?   3. Are you able to drink the recommended daily amount of fluids (48 ounces minimum/day) and protein (60-80 grams/day) as prescribed by the dietitian or nutritional counselor?   4. Are you taking the vitamins and minerals as prescribed?   5. Do you have the "on call" number to contact your surgeon if you have a problem or question?   6. Are your incisions free of redness, swelling or drainage? (If steri strips, address that these can fall off, shower as tolerated)  7. Have your bowels moved since your surgery?  If not, are you passing gas?   8. Are you up and walking 3-4 times per day?   9. Were you provided your discharge medications before your surgery or before you were discharged from the hospital and are you taking them without problem?

## 2017-03-03 ENCOUNTER — Encounter: Payer: 59 | Attending: Surgery | Admitting: Skilled Nursing Facility1

## 2017-03-03 DIAGNOSIS — Z6841 Body Mass Index (BMI) 40.0 and over, adult: Secondary | ICD-10-CM | POA: Diagnosis not present

## 2017-03-03 DIAGNOSIS — Z713 Dietary counseling and surveillance: Secondary | ICD-10-CM | POA: Diagnosis not present

## 2017-03-04 ENCOUNTER — Encounter: Payer: Self-pay | Admitting: Skilled Nursing Facility1

## 2017-03-04 NOTE — Progress Notes (Signed)
Bariatric Class:  Appt start time: 1530 end time:  1630.  2 Week Post-Operative Nutrition Class  Patient was seen on 03/03/2017 for Post-Operative Nutrition education at [facility].   Surgery date: 02/17/2017 Surgery type: Sleeve Gastrectomy  Start weight at [facility]: 289 Weight today: 257.8  TANITA  BODY COMP RESULTS  01/05/2017 03/04/2017   BMI (kg/m^2) 53.7 48.7   Fat Mass (lbs) 154 133.6   Fat Free Mass (lbs) 130 124.2   Total Body Water (lbs) 97.2 92    The following the learning objectives were met by the patient during this course:  Identifies Phase 3A (Soft, High Proteins) Dietary Goals and will begin from 2 weeks post-operatively to 2 months post-operatively  Identifies appropriate sources of fluids and proteins   States protein recommendations and appropriate sources post-operatively  Identifies the need for appropriate texture modifications, mastication, and bite sizes when consuming solids  Identifies appropriate multivitamin and calcium sources post-operatively  Describes the need for physical activity post-operatively and will follow MD recommendations  States when to call healthcare provider regarding medication questions or post-operative complications  Handouts given during class include:  Phase 3A: Soft, High Protein Diet Handout  Follow-Up Plan: Patient will follow-up at [facility] in 6 weeks for 2 month post-op nutrition visit for diet advancement per MD.

## 2017-03-07 NOTE — Addendum Note (Signed)
Addendum  created 03/07/17 1006 by Duane Boston, MD   Sign clinical note

## 2017-03-11 ENCOUNTER — Ambulatory Visit: Payer: 59 | Admitting: Family Medicine

## 2017-03-13 ENCOUNTER — Encounter: Payer: Self-pay | Admitting: Family Medicine

## 2017-03-13 ENCOUNTER — Ambulatory Visit (INDEPENDENT_AMBULATORY_CARE_PROVIDER_SITE_OTHER): Payer: 59 | Admitting: Family Medicine

## 2017-03-13 ENCOUNTER — Other Ambulatory Visit: Payer: Self-pay | Admitting: *Deleted

## 2017-03-13 VITALS — BP 102/67 | HR 74 | Temp 98.3°F | Resp 17 | Ht 62.0 in | Wt 253.0 lb

## 2017-03-13 DIAGNOSIS — R0682 Tachypnea, not elsewhere classified: Secondary | ICD-10-CM | POA: Diagnosis not present

## 2017-03-13 DIAGNOSIS — I471 Supraventricular tachycardia: Secondary | ICD-10-CM | POA: Diagnosis not present

## 2017-03-13 DIAGNOSIS — D518 Other vitamin B12 deficiency anemias: Secondary | ICD-10-CM

## 2017-03-13 DIAGNOSIS — G43709 Chronic migraine without aura, not intractable, without status migrainosus: Secondary | ICD-10-CM | POA: Diagnosis not present

## 2017-03-13 DIAGNOSIS — R200 Anesthesia of skin: Secondary | ICD-10-CM | POA: Diagnosis not present

## 2017-03-13 DIAGNOSIS — D500 Iron deficiency anemia secondary to blood loss (chronic): Secondary | ICD-10-CM

## 2017-03-13 DIAGNOSIS — R000 Tachycardia, unspecified: Secondary | ICD-10-CM

## 2017-03-13 DIAGNOSIS — Z9884 Bariatric surgery status: Secondary | ICD-10-CM | POA: Diagnosis not present

## 2017-03-13 DIAGNOSIS — N921 Excessive and frequent menstruation with irregular cycle: Secondary | ICD-10-CM

## 2017-03-13 NOTE — Progress Notes (Signed)
Subjective:    Patient ID: Tanya [name], female    DOB: [birth year], 36 y.o.   MRN: [id number]  03/13/2017  Follow-up (HBP) and Hypertension   HPI This 36 y.o. female presents for evaluation of hypertension. Admission weight 270; before surgery to now, has lost 33 pounds.  Surgery date 02/17/17.   Since surgery, lost 16 pounds in three weeks.  Goes back to work 03/30/17.  Was constipated; taking Miralax without much improvement.  Taking Colace generic with some improvement. Taking Colace as needed.  Saw surgeon yesterday; happy with progress.  No dizziness; +fatigue which is normal.  Had more energy at discharge.  Had to stay two extra days due to nausea. Nutritionist on 03/03/17; will follow-up in six weeks.  B: one egg. Water is main thing; no soda in four months. Snack: tries.  Cheese stick  Lunch: tuna with fat free mayo with pickle juice but no pickles Snack: cheese stick Supper: frozen chicken breast skinless, navy beans. Snack: rarely Drinking 64 ounces of water. Really not hungry. Can eat [employer] chili.  Home BP readings running 126/68-102/55. Heart rate ranging 60-80s.  No 100s.  Plantar fasciitis and tingling are both gone.   Migraines: none in two months; has been maintained on this dose of Topamax for two years.   Wt Readings from Last 3 Encounters:  03/13/17 253 lb (114.8 kg)  03/04/17 257 lb 12.8 oz (116.9 kg)  02/20/17 270 lb 3.2 oz (122.6 kg)   BP Readings from Last 3 Encounters:  03/13/17 102/67  02/20/17 (!) 161/82  01/30/17 114/68     Review of Systems  Constitutional: Negative for chills, diaphoresis, fatigue and fever.  Eyes: Negative for visual disturbance.  Respiratory: Negative for cough and shortness of breath.   Cardiovascular: Negative for chest pain, palpitations and leg swelling.  Gastrointestinal: Negative for abdominal pain, constipation, diarrhea, nausea and vomiting.  Endocrine: Negative for cold intolerance, heat intolerance,  polydipsia, polyphagia and polyuria.  Neurological: Negative for dizziness, tremors, seizures, syncope, facial asymmetry, speech difficulty, weakness, light-headedness, numbness and headaches.    Past Medical History:  Diagnosis Date  . Anemia   . Anxiety   . Arthritis    L knee OA  . Depression    history of depression in 2010-12-23 with death of mother  . Hypertension   . Migraine    Past Surgical History:  Procedure Laterality Date  . CESAREAN SECTION    . CHOLECYSTECTOMY    . HAND SURGERY    . KNEE SURGERY    . LAPAROSCOPIC GASTRIC SLEEVE RESECTION N/A 02/17/2017   Procedure: LAPAROSCOPIC GASTRIC SLEEVE RESECTION, UPPER ENDO;  Surgeon: Johnathan Hausen, MD;  Location: [facility];  Service: General;  Laterality: N/A;  . LAPAROSCOPIC UNILATERAL SALPINGECTOMY Left 06/10/2014   Procedure: LAPAROSCOPIC Left SALPINGECTOMY, removal of ectopic pregnancy;  Surgeon: Guss Bunde, MD;  Location: [facility];  Service: Gynecology;  Laterality: Left;  . TUBAL LIGATION    . WISDOM TOOTH EXTRACTION     Allergies  Allergen Reactions  . Imitrex [Sumatriptan] Shortness Of Breath  . Zomig [Zolmitriptan] Shortness Of Breath  . Latex Hives and Swelling  . Sulfa Antibiotics Other (See Comments)    Unknown. Since childhood.     Social History   Social History  . Marital status: Married    Spouse name: N/A  . Number of children: 2  . Years of education: N/A   Occupational History  . nurse tech [employer]  History Main Topics  . Smoking status: Never Smoker  . Smokeless tobacco: Never Used  . Alcohol use Yes     Comment: socially  . Drug use: No  . Sexual activity: No   Other Topics Concern  . Not on file   Social History Narrative   Marital status: married x 6 years; second marriage      Children: 2 children (20, 44); 1 stepgrandaughter; 1 stepdaughter      Lives: with husband, one son      Employment: Chartered certified accountant since 2017; fifth floor surgical floor; third shift 3  twelve hour shifts      Tobacco: none      Alcohol: none      Drugs; none      Exercise: none   Family History  Problem Relation Age of Onset  . Breast cancer Mother 22  . Cancer Mother   . Prostate cancer Father 41  . Breast cancer Maternal Aunt        dx in her 27s; BRCA neg  . Prostate cancer Paternal Uncle   . Prostate cancer Paternal Grandfather   . Ovarian cancer Sister 42       found during her pregnancy  . Prostate cancer Paternal Uncle   . Prostate cancer Paternal Uncle   . Cancer Other   . Stroke Other   . Diabetes Other   . Coronary artery disease Other        Objective:    BP 102/67   Pulse 74   Temp 98.3 F (36.8 C) (Oral)   Resp 17   Ht '5\' 2"'  (1.575 m)   Wt 253 lb (114.8 kg)   LMP 03/13/2017 (Approximate)   SpO2 98%   BMI 46.27 kg/m  Physical Exam  Constitutional: She is oriented to person, place, and time. She appears well-developed and well-nourished. No distress.  HENT:  Head: Normocephalic and atraumatic.  Right Ear: External ear normal.  Left Ear: External ear normal.  Nose: Nose normal.  Mouth/Throat: Oropharynx is clear and moist.  Eyes: Conjunctivae and EOM are normal. Pupils are equal, round, and reactive to light.  Neck: Normal range of motion. Neck supple. Carotid bruit is not present. No thyromegaly present.  Cardiovascular: Normal rate, regular rhythm, normal heart sounds and intact distal pulses.  Exam reveals no gallop and no friction rub.   No murmur heard. Pulmonary/Chest: Effort normal and breath sounds normal. She has no wheezes. She has no rales.  Abdominal: Soft. Bowel sounds are normal. She exhibits no distension and no mass. There is no tenderness. There is no rebound and no guarding.  Lymphadenopathy:    She has no cervical adenopathy.  Neurological: She is alert and oriented to person, place, and time. No cranial nerve deficit.  Skin: Skin is warm and dry. No rash noted. She is not diaphoretic. No erythema. No pallor.    Psychiatric: She has a normal mood and affect. Her behavior is normal.        Assessment & Plan:   1. Tachycardia   2. Iron deficiency anemia due to chronic blood loss   3. Tachypnea   4. Numbness in feet   5. Other vitamin B12 deficiency anemia   6. Menometrorrhagia   7. S/P laparoscopic sleeve gastrectomy May 2018   8. SVT (supraventricular tachycardia) ([place])   9. Chronic migraine without aura without status migrainosus, not intractable   10. Morbid obesity ([place])    -improving in tachycardia and hypertension s/p gastric  sleeve; decrease Metoprolol 89m to one bid.  -H/H stable; decrease iron supplementation to bid during menses only. -plantar fasciitis has resolved since being out of work during post-operative period.  -paresthesias have also resolved in post-operative period.  -consider weaning Topamax dose for migraine prevention in three months. -tolerating bariatric diet well at this time; increase water intake to help decrease frequency of constipation.  No orders of the defined types were placed in this encounter.  Meds ordered this encounter  Medications  . Multiple Vitamins-Minerals (MULTIVITAMIN WITH MINERALS) tablet    Sig: Take 1 tablet by mouth daily.  . calcium carbonate 1250 MG capsule    Sig: Take 1,250 mg by mouth 2 (two) times daily with a meal.    Return in about 6 weeks (around 04/24/2017) for recheck high blood pressure.   Karisma Meiser MElayne Guerin M.D. [facility]  [phone] [phone]

## 2017-03-13 NOTE — Patient Outreach (Signed)
[facility]) Care Management  03/13/2017  RYANN PAULI 07/12/1981 [id number]   No response from patient outreach attempts will proceed with case closure.   Objective: Per chart review, patient hospitalized 02/17/17 -02/20/17 for morbid obesity. Status post Laparoscopic sleeve gastrectomy and upper endoscopy on 02/17/17. Patient also hospitalized 12/08/16 - 12/12/16 for Vertigo and SVT (supraventricular tachycardia). [facility] Care Management attempted preoperative call times 3 and last attempt on 02/13/17. [facility] Care Management completed transition on care follow up call on 12/16/16.    Assessment: Received [facility] Transition of care referral on 02/20/17. Transition of care follow up not completed due to unable to contact patient and will proceed with case closure.    Plan:.RNCM will send case closure due to unable to contact request to [facility] Management. RNCM will send case closure due to unable to contact request to [facility].    Saleemah Mollenhauer H. Annia Friendly, BSN, [place] Management [facility] Telephonic CM Phone: [phone] Fax: [phone]

## 2017-03-13 NOTE — Patient Instructions (Addendum)
  Decrease Metoprolol 25mg  to ONE twice daily. Change iron to two tablets daily with period only.   IF you received an x-ray today, you will receive an invoice from [facility]. Please contact [facility] at [phone] with questions or concerns regarding your invoice.   IF you received labwork today, you will receive an invoice from [facility]. Please contact [facility] at [phone] with questions or concerns regarding your invoice.   Our billing staff will not be able to assist you with questions regarding bills from these companies.  You will be contacted with the lab results as soon as they are available. The fastest way to get your results is to activate your My Chart account. Instructions are located on the last page of this paperwork. If you have not heard from [place] regarding the results in 2 weeks, please contact this office.

## 2017-03-23 ENCOUNTER — Other Ambulatory Visit: Payer: Self-pay | Admitting: Family Medicine

## 2017-03-23 ENCOUNTER — Other Ambulatory Visit: Payer: Self-pay | Admitting: Obstetrics and Gynecology

## 2017-03-23 DIAGNOSIS — Z1231 Encounter for screening mammogram for malignant neoplasm of breast: Secondary | ICD-10-CM

## 2017-04-02 ENCOUNTER — Other Ambulatory Visit (HOSPITAL_COMMUNITY): Payer: Self-pay | Admitting: Surgery

## 2017-04-02 DIAGNOSIS — Z9884 Bariatric surgery status: Secondary | ICD-10-CM

## 2017-04-03 ENCOUNTER — Ambulatory Visit
Admission: RE | Admit: 2017-04-03 | Discharge: 2017-04-03 | Disposition: A | Discharge status: Home / Self Care | Payer: 59 | Source: Ambulatory Visit | Attending: Family Medicine | Admitting: Family Medicine

## 2017-04-03 DIAGNOSIS — Z1231 Encounter for screening mammogram for malignant neoplasm of breast: Secondary | ICD-10-CM | POA: Diagnosis not present

## 2017-04-04 ENCOUNTER — Emergency Department (HOSPITAL_COMMUNITY)
Admission: EM | Admit: 2017-04-04 | Discharge: 2017-04-04 | Disposition: A | Discharge status: Home / Self Care | Payer: 59 | Attending: Emergency Medicine | Admitting: Emergency Medicine

## 2017-04-04 ENCOUNTER — Encounter (HOSPITAL_COMMUNITY): Payer: Self-pay | Admitting: Emergency Medicine

## 2017-04-04 ENCOUNTER — Emergency Department (HOSPITAL_COMMUNITY): Payer: 59

## 2017-04-04 DIAGNOSIS — I10 Essential (primary) hypertension: Secondary | ICD-10-CM | POA: Diagnosis not present

## 2017-04-04 DIAGNOSIS — Z91040 Latex allergy status: Secondary | ICD-10-CM | POA: Diagnosis not present

## 2017-04-04 DIAGNOSIS — R1033 Periumbilical pain: Secondary | ICD-10-CM | POA: Insufficient documentation

## 2017-04-04 DIAGNOSIS — Z79899 Other long term (current) drug therapy: Secondary | ICD-10-CM | POA: Diagnosis not present

## 2017-04-04 DIAGNOSIS — R109 Unspecified abdominal pain: Secondary | ICD-10-CM | POA: Diagnosis not present

## 2017-04-04 LAB — URINALYSIS, ROUTINE W REFLEX MICROSCOPIC
Bilirubin Urine: NEGATIVE
Glucose, UA: NEGATIVE mg/dL
KETONES UR: 20 mg/dL — AB
NITRITE: NEGATIVE
PROTEIN: NEGATIVE mg/dL
Specific Gravity, Urine: 1.006 (ref 1.005–1.030)
pH: 6 (ref 5.0–8.0)

## 2017-04-04 LAB — CBC WITH DIFFERENTIAL/PLATELET
Basophils Absolute: 0 10*3/uL (ref 0.0–0.1)
Basophils Relative: 0 %
EOS ABS: 0.2 10*3/uL (ref 0.0–0.7)
EOS PCT: 2 %
HCT: 39 % (ref 36.0–46.0)
Hemoglobin: 13 g/dL (ref 12.0–15.0)
LYMPHS ABS: 1.8 10*3/uL (ref 0.7–4.0)
Lymphocytes Relative: 23 %
MCH: 26.3 pg (ref 26.0–34.0)
MCHC: 33.3 g/dL (ref 30.0–36.0)
MCV: 78.9 fL (ref 78.0–100.0)
MONOS PCT: 7 %
Monocytes Absolute: 0.6 10*3/uL (ref 0.1–1.0)
Neutro Abs: 5.3 10*3/uL (ref 1.7–7.7)
Neutrophils Relative %: 68 %
Platelets: 316 10*3/uL (ref 150–400)
RBC: 4.94 MIL/uL (ref 3.87–5.11)
RDW: 15.1 % (ref 11.5–15.5)
WBC: 7.9 10*3/uL (ref 4.0–10.5)

## 2017-04-04 LAB — COMPREHENSIVE METABOLIC PANEL
ALT: 40 U/L (ref 14–54)
ANION GAP: 9 (ref 5–15)
AST: 23 U/L (ref 15–41)
Albumin: 4.1 g/dL (ref 3.5–5.0)
Alkaline Phosphatase: 42 U/L (ref 38–126)
BUN: 13 mg/dL (ref 6–20)
CHLORIDE: 110 mmol/L (ref 101–111)
CO2: 22 mmol/L (ref 22–32)
Calcium: 9.3 mg/dL (ref 8.9–10.3)
Creatinine, Ser: 0.68 mg/dL (ref 0.44–1.00)
GFR calc non Af Amer: >60 mL/min (ref >60)
Glucose, Bld: 93 mg/dL (ref 65–99)
POTASSIUM: 3.5 mmol/L (ref 3.5–5.1)
SODIUM: 141 mmol/L (ref 135–145)
Total Bilirubin: 0.4 mg/dL (ref 0.3–1.2)
Total Protein: 7.2 g/dL (ref 6.5–8.1)

## 2017-04-04 LAB — LIPASE, BLOOD: Lipase: 22 U/L (ref 11–51)

## 2017-04-04 LAB — I-STAT BETA HCG BLOOD, ED (MC, WL, AP ONLY): I-stat hCG, quantitative: <5 m[IU]/mL (ref <5)

## 2017-04-04 MED ORDER — IOPAMIDOL (ISOVUE-300) INJECTION 61%
INTRAVENOUS | Status: AC
  Filled 2017-04-04: qty 30

## 2017-04-04 MED ORDER — METOCLOPRAMIDE HCL 5 MG/ML IJ SOLN
10.0000 mg | Freq: Once | INTRAMUSCULAR | Status: AC
  Administered 2017-04-04: 10 mg via INTRAVENOUS
  Filled 2017-04-04: qty 2

## 2017-04-04 MED ORDER — DEXTROSE 5 % IV SOLN
1.0000 g | Freq: Once | INTRAVENOUS | Status: AC
  Administered 2017-04-04: 1 g via INTRAVENOUS
  Filled 2017-04-04: qty 10

## 2017-04-04 MED ORDER — FENTANYL CITRATE (PF) 100 MCG/2ML IJ SOLN
25.0000 ug | Freq: Once | INTRAMUSCULAR | Status: AC
  Administered 2017-04-04: 25 ug via INTRAVENOUS
  Filled 2017-04-04: qty 2

## 2017-04-04 MED ORDER — PROMETHAZINE HCL 25 MG/ML IJ SOLN
12.5000 mg | Freq: Once | INTRAMUSCULAR | Status: AC
  Administered 2017-04-04: 12.5 mg via INTRAVENOUS
  Filled 2017-04-04: qty 1

## 2017-04-04 MED ORDER — IOPAMIDOL (ISOVUE-300) INJECTION 61%
100.0000 mL | Freq: Once | INTRAVENOUS | Status: AC | PRN
  Administered 2017-04-04: 100 mL via INTRAVENOUS

## 2017-04-04 MED ORDER — METOCLOPRAMIDE HCL 10 MG PO TABS: 10.0000 mg | ORAL_TABLET | Freq: Four times a day (QID) | ORAL | 0 refills | Status: DC | PRN

## 2017-04-04 MED ORDER — SUCRALFATE 1 GM/10ML PO SUSP
1.0000 g | Freq: Three times a day (TID) | ORAL | Status: DC
  Administered 2017-04-04: 1 g via ORAL
  Filled 2017-04-04: qty 10

## 2017-04-04 MED ORDER — ONDANSETRON HCL 4 MG/2ML IJ SOLN
4.0000 mg | Freq: Once | INTRAMUSCULAR | Status: AC
  Administered 2017-04-04: 4 mg via INTRAVENOUS
  Filled 2017-04-04: qty 2

## 2017-04-04 MED ORDER — IOPAMIDOL (ISOVUE-300) INJECTION 61%
INTRAVENOUS | Status: AC
  Filled 2017-04-04: qty 100

## 2017-04-04 MED ORDER — CEPHALEXIN 500 MG PO CAPS: 500.0000 mg | ORAL_CAPSULE | Freq: Four times a day (QID) | ORAL | 0 refills | Status: DC

## 2017-04-04 MED ORDER — FENTANYL CITRATE (PF) 100 MCG/2ML IJ SOLN
50.0000 ug | Freq: Once | INTRAMUSCULAR | Status: AC
  Administered 2017-04-04: 50 ug via INTRAVENOUS
  Filled 2017-04-04: qty 2

## 2017-04-04 MED ORDER — IOPAMIDOL (ISOVUE-300) INJECTION 61%
30.0000 mL | Freq: Once | INTRAVENOUS | Status: AC | PRN
  Administered 2017-04-04: 30 mL via ORAL

## 2017-04-04 NOTE — ED Notes (Signed)
Bed: ZP68 Expected date:  Expected time:  Means of arrival:  Comments: Pt in room

## 2017-04-04 NOTE — ED Provider Notes (Signed)
[facility] DEPT Provider Note   CSN: [id number] Arrival date & time: 04/04/17  0711     History   Chief Complaint Chief Complaint  Patient presents with  . Abdominal Pain    HPI Tanya [name] is a 36 y.o. female.   Abdominal Pain   This is a new problem. The current episode started 3 to 5 hours ago. The problem occurs constantly. The problem has not changed since onset.The pain is associated with a previous surgery. The pain is located in the periumbilical region. The quality of the pain is sharp. The pain is moderate. Associated symptoms include nausea (for 2 weeks, progressively worsening). Pertinent negatives include anorexia, fever and melena. Nothing aggravates the symptoms. Nothing relieves the symptoms. Past workup does not include GI consult. Her past medical history does not include ulcerative colitis.    Past Medical History:  Diagnosis Date  . Anemia   . Anxiety   . Arthritis    L knee OA  . Depression    history of depression in 11/23/10 with death of mother  . Hypertension   . Migraine     Patient Active Problem List   Diagnosis Date Noted  . S/P laparoscopic sleeve gastrectomy May 2018 02/17/2017  . Iron deficiency anemia due to chronic blood loss 02/01/2017  . Tachycardia 12/10/2016  . Thrombocytosis ([place]) 12/10/2016  . Chest discomfort   . SOB (shortness of breath)   . SVT (supraventricular tachycardia) ([place])   . Tachypnea   . Numbness in feet 12/09/2016  . Vertigo 12/08/2016  . Anemia due to vitamin B12 deficiency 12/08/2016  . Leukocytosis 12/08/2016  . Menometrorrhagia 12/05/2016  . Morbid obesity ([place]) 12/05/2016  . Chronic migraine without aura without status migrainosus, not intractable 12/05/2016  . Ruptured left tubal ectopic pregnancy causing hemoperitoneum 06/10/2014    Past Surgical History:  Procedure Laterality Date  . CESAREAN SECTION    . CHOLECYSTECTOMY    . HAND SURGERY    . KNEE SURGERY    . LAPAROSCOPIC GASTRIC SLEEVE  RESECTION N/A 02/17/2017   Procedure: LAPAROSCOPIC GASTRIC SLEEVE RESECTION, UPPER ENDO;  Surgeon: Johnathan Hausen, MD;  Location: [facility];  Service: General;  Laterality: N/A;  . LAPAROSCOPIC UNILATERAL SALPINGECTOMY Left 06/10/2014   Procedure: LAPAROSCOPIC Left SALPINGECTOMY, removal of ectopic pregnancy;  Surgeon: Guss Bunde, MD;  Location: [facility];  Service: Gynecology;  Laterality: Left;  . TUBAL LIGATION    . WISDOM TOOTH EXTRACTION      OB History    Gravida Para Term Preterm AB Living   _0 SAB TAB Ectopic Multiple Live Births           2       Home Medications    Prior to Admission medications   Medication Sig Start Date End Date Taking? Authorizing Provider  Biotin 10000 MCG TABS Take 1 tablet by mouth daily.   Yes [provider]  calcium carbonate 1250 MG capsule Take 1,250 mg by mouth 2 (two) times daily with a meal.   Yes [provider]  IRON PO Take 65 mg by mouth 2 (two) times daily.    Yes [provider]  metoprolol tartrate (LOPRESSOR) 25 MG tablet Take 2 tablets (50 mg total) by mouth 2 (two) times daily. Patient taking differently: Take 25 mg by mouth 2 (two) times daily.  01/02/17  Yes Wardell Honour, MD  Multiple Vitamins-Minerals (MULTIVITAMIN WITH MINERALS) tablet Take 1  tablet by mouth daily.   Yes [provider]  ondansetron (ZOFRAN-ODT) 4 MG disintegrating tablet Take 4 mg by mouth daily as needed. 02/16/17  Yes [provider]  pantoprazole (PROTONIX) 40 MG tablet Take 40 mg by mouth daily. 02/13/17  Yes [provider]  topiramate (TOPAMAX) 100 MG tablet Take 2 tablets (200 mg total) by mouth 2 (two) times daily. 12/20/16  Yes Wardell Honour, MD  vitamin B-12 1000 MCG tablet Take 1 tablet (1,000 mcg total) by mouth daily. 12/13/16  Yes Barton Dubois, MD  cephALEXin (KEFLEX) 500 MG capsule Take 1 capsule (500 mg total) by mouth 4 (four) times daily. 04/04/17   Emlyn Maves, Corene Cornea, MD  metoCLOPramide  (REGLAN) 10 MG tablet Take 1 tablet (10 mg total) by mouth every 6 (six) hours as needed for nausea (nausea/headache). 04/04/17   Ardene Remley, Corene Cornea, MD    Family History Family History  Problem Relation Age of Onset  . Breast cancer Mother 33  . Cancer Mother   . Prostate cancer Father 5  . Breast cancer Maternal Aunt        dx in her 17s; BRCA neg  . Prostate cancer Paternal Uncle   . Prostate cancer Paternal Grandfather   . Ovarian cancer Sister 69       found during her pregnancy  . Prostate cancer Paternal Uncle   . Prostate cancer Paternal Uncle   . Cancer Other   . Stroke Other   . Diabetes Other   . Coronary artery disease Other     Social History Social History  Substance Use Topics  . Smoking status: Never Smoker  . Smokeless tobacco: Never Used  . Alcohol use Yes     Comment: socially     Allergies   Imitrex [sumatriptan]; Zomig [zolmitriptan]; Latex; and Sulfa antibiotics   Review of Systems Review of Systems  Constitutional: Negative for fever.  Gastrointestinal: Positive for abdominal pain and nausea (for 2 weeks, progressively worsening). Negative for anorexia and melena.  All other systems reviewed and are negative.    Physical Exam Updated Vital Signs BP (!) 98/59 (BP Location: Left Arm)   Pulse (!) 53   Temp 97.8 F (36.6 C) (Oral)   Resp 20   Ht 5' 1.75" (1.568 m)   Wt 112 kg (247 lb)   LMP 03/13/2017 (Exact Date) Comment: HCG < 5 04-04-17  SpO2 100%   BMI 45.54 kg/m   Physical Exam  Constitutional: She is oriented to person, place, and time. She appears well-developed and well-nourished.  HENT:  Head: Normocephalic and atraumatic.  Eyes: Conjunctivae and EOM are normal.  Neck: Normal range of motion.  Cardiovascular: Normal rate and regular rhythm.   Pulmonary/Chest: Effort normal. No stridor. No respiratory distress.  Abdominal: Soft. She exhibits no distension. There is tenderness (mild periumbilical). There is no guarding.    Musculoskeletal: Normal range of motion. She exhibits no edema or deformity.  Neurological: She is alert and oriented to person, place, and time. No cranial nerve deficit. Coordination normal.  Skin: Skin is warm and dry.  Nursing note and vitals reviewed.    ED Treatments / Results  Labs (all labs ordered are listed, but only abnormal results are displayed) Labs Reviewed  URINALYSIS, ROUTINE W REFLEX MICROSCOPIC - Abnormal; Notable for the following:       Result Value   Hgb urine dipstick SMALL (*)    Ketones, ur 20 (*)    Leukocytes, UA SMALL (*)  Bacteria, UA FEW (*)    Squamous Epithelial / LPF 0-5 (*)    All other components within normal limits  URINE CULTURE  CBC WITH DIFFERENTIAL/PLATELET  COMPREHENSIVE METABOLIC PANEL  LIPASE, BLOOD  I-STAT BETA HCG BLOOD, ED ([facility], [facility], [facility] ONLY)    EKG  EKG Interpretation None       Radiology Ct Abdomen Pelvis W Contrast  Result Date: 04/04/2017 CLINICAL DATA:  Sudden onset of nausea and lower abdominal pain. Gastric sleeve procedure in May 2018. Evaluate for a leak. History of left salpingectomy. EXAM: CT ABDOMEN AND PELVIS WITH CONTRAST TECHNIQUE: Multidetector CT imaging of the abdomen and pelvis was performed using the standard protocol following bolus administration of intravenous contrast. CONTRAST:  68m ISOVUE-300 IOPAMIDOL (ISOVUE-300) INJECTION 61%, 1088mISOVUE-300 IOPAMIDOL (ISOVUE-300) INJECTION 61% COMPARISON:  06/10/2014 FINDINGS: Lower chest: Lung bases are clear.  No pleural effusions. Hepatobiliary: Stable focal fat in the anterior left hepatic lobe which has minimally changed. Otherwise, normal appearance of the liver and the portal venous system is patent. Gallbladder has been removed. No biliary dilatation. Pancreas: Normal appearance of the pancreas without inflammation or duct dilatation. Spleen: Normal appearance of spleen without enlargement. Adrenals/Urinary Tract: Normal adrenal glands. Urinary bladder is  normal. Normal appearance of both kidneys without hydronephrosis. No suspicious renal lesions. Stomach/Bowel: Postsurgical changes in stomach compatible with a gastric sleeve procedure. There is no inflammation or fluid surrounding the stomach. Normal appendix. No evidence for bowel dilatation, obstruction or focal bowel inflammation. Vascular/Lymphatic: Minimal atherosclerotic disease in the iliac arteries. Main visceral arteries are patent. Negative for an abdominal aortic aneurysm. No gross venous abnormalities. No lymph node enlargement in the abdomen or pelvis. Reproductive: Uterus has a slightly globular and nodular appearance. Uterus measures 12.4 cm in length. Findings are similar to the previous examination. Cannot exclude small fibroids or adenomyosis. 1.9 cm enhancing low-density structure in the right ovary probably represents a small corpus luteal cyst. Left ovary is unremarkable. Other: Trace fluid in the pelvis may be physiologic. Small umbilical hernia containing fat. Tiny supraumbilical hernia containing fat. Negative for free air. Musculoskeletal: No acute bone abnormality. IMPRESSION: No acute abnormality in the abdomen or pelvis. Postoperative changes in the stomach without complicating features. Trace fluid in the pelvis is likely physiologic. Electronically Signed   By: AdMarkus Daft.D.   On: 04/04/2017 10:15   Mm Digital Screening Bilateral  Result Date: 04/03/2017 CLINICAL DATA:  Screening. EXAM: DIGITAL SCREENING BILATERAL MAMMOGRAM WITH CAD COMPARISON:  Previous exam(s). ACR Breast Density Category b: There are scattered areas of fibroglandular density. FINDINGS: There are no findings suspicious for malignancy. Images were processed with CAD. IMPRESSION: No mammographic evidence of malignancy. A result letter of this screening mammogram will be mailed directly to the patient. RECOMMENDATION: Screening mammogram at age 36(Code:SM-B-40A) BI-RADS CATEGORY  1: Negative. Electronically  Signed   By: DaLajean Manes.D.   On: 04/03/2017 10:18    Procedures Procedures (including critical care time)  Medications Ordered in ED Medications  iopamidol (ISOVUE-300) 61 % injection (not administered)  iopamidol (ISOVUE-300) 61 % injection (not administered)  sucralfate (CARAFATE) 1 GM/10ML suspension 1 g (1 g Oral Given 04/04/17 1147)  promethazine (PHENERGAN) injection 12.5 mg (12.5 mg Intravenous Given 04/04/17 0823)  fentaNYL (SUBLIMAZE) injection 50 mcg (50 mcg Intravenous Given 04/04/17 0823)  iopamidol (ISOVUE-300) 61 % injection 30 mL (30 mLs Oral Contrast Given 04/04/17 0819)  iopamidol (ISOVUE-300) 61 % injection 100 mL (100 mLs Intravenous Contrast Given 04/04/17 0952)  fentaNYL (SUBLIMAZE) injection 25 mcg (25 mcg Intravenous Given 04/04/17 1054)  ondansetron (ZOFRAN) injection 4 mg (4 mg Intravenous Given 04/04/17 1054)  cefTRIAXone (ROCEPHIN) 1 g in dextrose 5 % 50 mL IVPB (0 g Intravenous Stopped 04/04/17 1309)  metoCLOPramide (REGLAN) injection 10 mg (10 mg Intravenous Given 04/04/17 1313)     Initial Impression / Assessment and Plan / ED Course  I have reviewed the triage vital signs and the nursing notes.  Pertinent labs & imaging results that were available during my care of the patient were reviewed by me and considered in my medical decision making (see chart for details).     H/o gastric sleeve. A couple weeks of nausea and now acute onset of sharp periumbilical abdominal pain.  I discussed with Dr.martin who recommends labs/ct and pain control.   Dr. Hassell Done saw and suggested carafate but that did not help at all with symptoms, patient did not want Rx.   I suggested checking an ECG 2/2 ongoing nausea with abdominal pain but patient refused and just wanted to go home.   Did have likely UTI on UA so will treat with keflex at home. pcp and GI follow up as needed for symptoms.   Final Clinical Impressions(s) / ED Diagnoses   Final diagnoses:  Periumbilical  abdominal pain    New Prescriptions Discharge Medication List as of 04/04/2017  1:41 PM    START taking these medications   Details  cephALEXin (KEFLEX) 500 MG capsule Take 1 capsule (500 mg total) by mouth 4 (four) times daily., Starting Sat 04/04/2017, Print    metoCLOPramide (REGLAN) 10 MG tablet Take 1 tablet (10 mg total) by mouth every 6 (six) hours as needed for nausea (nausea/headache)., Starting Sat 04/04/2017, Print         Deann Mclaine, Corene Cornea, MD 04/04/17 [phone]

## 2017-04-04 NOTE — ED Triage Notes (Addendum)
Pt reports having gastric sleeve May 15,2018. Pt reports sudden onset of nausea w/ lower abd pain. Pt sts that she has not had this pain before, only nausea. LBM was last night, pt reports diarrhea. Pt attempted to drink approx 1 hr PTA and worsened the pain. Pt is ambulatory w/o difficulty and in NAD. Pt has spoken with Dr Hassell Done and sts he will see her in ED

## 2017-04-06 LAB — URINE CULTURE

## 2017-04-06 MED FILL — CEPHALEXIN 500 MG CAPSULE: 500 | 5 days supply | Qty: 20 | Fill #0

## 2017-04-06 MED FILL — METOCLOPRAMIDE 10 MG TABLET: 10 | 4 days supply | Qty: 15 | Fill #0

## 2017-04-07 ENCOUNTER — Telehealth: Payer: Self-pay | Admitting: Emergency Medicine

## 2017-04-07 NOTE — Telephone Encounter (Signed)
Post ED Visit - Positive Culture Follow-up  Culture report reviewed by antimicrobial stewardship pharmacist:  []  Elenor Quinones, Pharm.D. []  Heide Guile, Pharm.D., BCPS AQ-ID []  Parks Neptune, Pharm.D., BCPS []  Alycia Rossetti, Pharm.D., BCPS []  [place], Pharm.D., BCPS, AAHIVP []  Legrand Como, Pharm.D., BCPS, AAHIVP []  Salome Arnt, PharmD, BCPS []  Dimitri Ped, PharmD, BCPS []  Vincenza Hews, PharmD, BCPS  Positive urine culture Treated with cephalexin, organism sensitive to the same and no further patient follow-up is required at this time.  [name] 04/07/2017, 4:12 PM

## 2017-04-13 ENCOUNTER — Ambulatory Visit (HOSPITAL_COMMUNITY): Payer: 59

## 2017-04-14 ENCOUNTER — Encounter: Payer: Self-pay | Admitting: Skilled Nursing Facility1

## 2017-04-14 ENCOUNTER — Encounter: Payer: 59 | Attending: Surgery | Admitting: Skilled Nursing Facility1

## 2017-04-14 DIAGNOSIS — Z713 Dietary counseling and surveillance: Secondary | ICD-10-CM | POA: Diagnosis not present

## 2017-04-14 DIAGNOSIS — Z6841 Body Mass Index (BMI) 40.0 and over, adult: Secondary | ICD-10-CM | POA: Insufficient documentation

## 2017-04-14 NOTE — Patient Instructions (Addendum)
-  Try alkaline water a pH of 8.8  -Try kefir or kefir cup in the yogurt aisle   -Read the label on your chicken to see how many chickens it takes to get to 21 grams of protein and how much of that are you eating   -Stick to 2 cheese sticks a day   -Eat all of your protein first then start in on your vegetables   -Look at your label for single digits of total fat and sugar   -Try the capsule for your multi if that does not work you can break open the capsule into yogurt

## 2017-04-14 NOTE — Progress Notes (Signed)
  Follow-up visit:  8 Weeks Post-Operative sleeve Surgery  Primary concerns today: Post-operative Bariatric Surgery Nutrition Management.  Pt states her surgeon sent her to the ED for a CAT scan due to severe nausea last week but she feels it is going away now. Pt states she works third shift which gets in the way of getting her protein in.  Pt states she sleeps well.  Pt states she is working on getting day shift. Pt states she wants to try halotop but decided she would wait until the next appointment to make sure she does not react to it. Pt states she cannot stand the celebrate multivitamin.  Pt states she wants to talk about fruits next time.   Surgery Date: 02/17/2017 Surgery type: Sleeve Gastrectomy  Start weight at [facility]: 289 Weight today: 257.8 Weight change: 19.2  TANITA  BODY COMP RESULTS  01/05/2017 03/04/2017 04/14/2017   BMI (kg/m^2) 53.7 48.7 43.6   Fat Mass (lbs) 154 133.6 115.2   Fat Free Mass (lbs) 130 124.2 123.4   Total Body Water (lbs) 97.2 92 90.4   24-hr recall: 3-4 cheesesticks a day B (AM): scrambled egg and cheesestick (10g) Snk (AM): cheesestick (10g) L (PM): chicken or egg or hamburger patty (21g) Snk (PM): protein shake half to a whole (30g) D (PM): chicken or egg or hamburger patty (21g) Snk (PM): cheesestick or boiled egg or greek yogurt (10g)  Fluid intake: water, sometimes with flavorings, powerade zero: 96 fluid ounces Estimated total protein intake: 100  Medications: lowered blood pressure medicine  Supplementation: celebrate multi and calcium   Using straws: no Drinking while eating: no Having you been chewing well: yes  Chewing/swallowing difficulties: no Changes in vision: no Changes to mood/headaches: no Hair loss/Changes to skin/Changes to nails: no Any difficulty focusing or concentrating: no Sweating: no Dizziness/Lightheaded: no Palpitations: no  Carbonated beverages: no N/V/D/C/GAS: nausea getting better, taking miralax: having a  movement about every 3 days  Abdominal Pain: no Dumping syndrome: no  Recent physical activity:  2 days a week: cardio and weight bearing for 60-90 minutes   Progress Towards Goal(s):  In progress.  Handouts given during visit include:  Ns veggies + protein   Nutritional Diagnosis:  [place]-3.3 Overweight/obesity related to past poor dietary habits and physical inactivity as evidenced by patient w/ recent sleeve surgery following dietary guidelines for continued weight loss.    Intervention:  Nutrition counseling. Dietitian educated the pt on advancing her diet to include non-starchy vegetables. Goals: -Try alkaline water a pH of 8.8 -Try kefir or kefir cup in the yogurt aisle  -Read the label on your chicken to see how many chickens it takes to get to 21 grams of protein and how much of that are you eating  -Stick to 2 cheese sticks a day  -Eat all of your protein first then start in on your vegetables  -Look at your label for single digits of total fat and sugar  -Try the capsule for your multi if that does not work you can break open the capsule into yogurt  Teaching Method Utilized:  Visual Auditory Hands on  Barriers to learning/adherence to lifestyle change: none identified   Demonstrated degree of understanding via:  Teach Back   Monitoring/Evaluation:  Dietary intake, exercise, lap band fills, and body weight.

## 2017-05-13 ENCOUNTER — Ambulatory Visit: Payer: 59 | Admitting: Family Medicine

## 2017-05-20 DIAGNOSIS — H52223 Regular astigmatism, bilateral: Secondary | ICD-10-CM | POA: Diagnosis not present

## 2017-05-22 ENCOUNTER — Ambulatory Visit (INDEPENDENT_AMBULATORY_CARE_PROVIDER_SITE_OTHER): Payer: 59 | Admitting: Family Medicine

## 2017-05-22 ENCOUNTER — Encounter: Payer: Self-pay | Admitting: Family Medicine

## 2017-05-22 VITALS — BP 116/74 | HR 68 | Temp 97.6°F | Resp 18 | Ht 62.0 in | Wt 226.6 lb

## 2017-05-22 DIAGNOSIS — Z9884 Bariatric surgery status: Secondary | ICD-10-CM

## 2017-05-22 DIAGNOSIS — G43709 Chronic migraine without aura, not intractable, without status migrainosus: Secondary | ICD-10-CM | POA: Diagnosis not present

## 2017-05-22 DIAGNOSIS — I471 Supraventricular tachycardia, unspecified: Secondary | ICD-10-CM

## 2017-05-22 DIAGNOSIS — N920 Excessive and frequent menstruation with regular cycle: Secondary | ICD-10-CM

## 2017-05-22 DIAGNOSIS — I10 Essential (primary) hypertension: Secondary | ICD-10-CM | POA: Diagnosis not present

## 2017-05-22 DIAGNOSIS — D500 Iron deficiency anemia secondary to blood loss (chronic): Secondary | ICD-10-CM

## 2017-05-22 DIAGNOSIS — D518 Other vitamin B12 deficiency anemias: Secondary | ICD-10-CM

## 2017-05-22 MED ORDER — DULOXETINE HCL 30 MG PO CPEP: 30.0000 mg | ORAL_CAPSULE | Freq: Every day | ORAL | 3 refills | Status: DC

## 2017-05-22 MED ORDER — METOPROLOL TARTRATE 25 MG PO TABS: 50.0000 mg | ORAL_TABLET | Freq: Every day | ORAL | 1 refills | Status: DC

## 2017-05-22 MED FILL — DULoxetine HCL 30 MG CPEP: 30 | 30 days supply | Qty: 30 | Fill #0

## 2017-05-22 NOTE — Progress Notes (Signed)
Subjective:    Patient ID: Tanya [name], female    DOB: 1981-10-01, 36 y.o.   MRN: [id number]  05/22/2017  Hypertension (pt states BP have about the same has it is today 116/74.) and Follow-up   HPI This 36 y.o. female presents for evaluation of hypertension, tachycardia, iron deficiency anemia, obesity s/p sleeve gastrectomy in 02/2017.  Initial weight 270 pounds. Blood pressures 110/80.  No lows; no systolics less than 998.  Checks BP with headaches and normal.   Getting dizzy when shaving legs and when bending over at work.  Checks BP and running 110/80.  Lots of stress going on; work and children.Having more migraines.  Taking Topamax.  Cannot decrease Topamax at this time. Requesting first shift.  Third shift is killing patient.  Two years of third shift.  Has lost 63 pounds in 13 weeks.  Goal weight 145-150 pounds.  Seeing surgeon in two weeks.  Taking vitamins daily; 2 MVI and 3 calcium forever.  Getting seven hours of sleep per night; will sleep 10-4 or 5.  Only sleeping 4-5 hours on work days.  Moderate stress but not severe.  Drink a little caffeine at work.  Half cup.  No food triggers.  Old food triggers were sugar.  No cheese; rare low fat cheese sticks.  Eats chicken all the time.  Onset one month ago.  Getting them almost every day.  Has missed work twice in past week.  Aura with migraine.    Horribly constipated; taking two stool softeners and miralax; having one bowel movement per week since gastric sleeve.    Menses really heavy; not taking iron with menses.  Needs ablation. Must pay off gastric sleeve before can have another procedure.  Going to gym 1-3 times per week.  BP Readings from Last 3 Encounters:  05/22/17 116/74  04/04/17 (!) 98/59  03/13/17 102/67   Wt Readings from Last 3 Encounters:  05/22/17 226 lb 9.6 oz (102.8 kg)  04/14/17 238 lb 9.6 oz (108.2 kg)  04/04/17 247 lb (112 kg)   Immunization History  Administered Date(s) Administered  .  Influenza-Unspecified 07/06/2016    Review of Systems  Constitutional: Negative for chills, diaphoresis, fatigue and fever.  Eyes: Negative for visual disturbance.  Respiratory: Negative for cough and shortness of breath.   Cardiovascular: Negative for chest pain, palpitations and leg swelling.  Gastrointestinal: Positive for constipation. Negative for abdominal pain, diarrhea, nausea and vomiting.  Endocrine: Negative for cold intolerance, heat intolerance, polydipsia, polyphagia and polyuria.  Genitourinary: Positive for menstrual problem.  Neurological: Positive for dizziness and headaches. Negative for tremors, seizures, syncope, facial asymmetry, speech difficulty, weakness, light-headedness and numbness.  Psychiatric/Behavioral: Negative for dysphoric mood, self-injury, sleep disturbance and suicidal ideas. The patient is nervous/anxious.     Past Medical History:  Diagnosis Date  . Anemia   . Anxiety   . Arthritis    L knee OA  . Depression    history of depression in 12/02/10 with death of mother  . Hypertension   . Migraine    Past Surgical History:  Procedure Laterality Date  . CESAREAN SECTION    . CHOLECYSTECTOMY    . HAND SURGERY    . KNEE SURGERY    . LAPAROSCOPIC GASTRIC SLEEVE RESECTION N/A 02/17/2017   Procedure: LAPAROSCOPIC GASTRIC SLEEVE RESECTION, UPPER ENDO;  Surgeon: Johnathan Hausen, MD;  Location: [facility];  Service: General;  Laterality: N/A;  . LAPAROSCOPIC UNILATERAL SALPINGECTOMY Left 06/10/2014   Procedure: LAPAROSCOPIC Left SALPINGECTOMY,  removal of ectopic pregnancy;  Surgeon: Guss Bunde, MD;  Location: [facility];  Service: Gynecology;  Laterality: Left;  . TUBAL LIGATION    . WISDOM TOOTH EXTRACTION     Allergies  Allergen Reactions  . Imitrex [Sumatriptan] Shortness Of Breath  . Zomig [Zolmitriptan] Shortness Of Breath  . Latex Hives and Swelling  . Sulfa Antibiotics Other (See Comments)    Unknown. Since childhood.     Social History    Social History  . Marital status: Married    Spouse name: N/A  . Number of children: 2  . Years of education: N/A   Occupational History  . nurse tech [employer]   Social History Main Topics  . Smoking status: Never Smoker  . Smokeless tobacco: Never Used  . Alcohol use Yes     Comment: socially  . Drug use: No  . Sexual activity: No   Other Topics Concern  . Not on file   Social History Narrative   Marital status: married x 6 years; second marriage      Children: 2 children (20, 75); 1 stepgrandaughter; 1 stepdaughter      Lives: with husband, one son      Employment: Chartered certified accountant since 2017; fifth floor surgical floor; third shift 3 twelve hour shifts      Tobacco: none      Alcohol: none      Drugs; none      Exercise: none   Family History  Problem Relation Age of Onset  . Breast cancer Mother 39  . Cancer Mother   . Prostate cancer Father 30  . Breast cancer Maternal Aunt        dx in her 81s; BRCA neg  . Prostate cancer Paternal Uncle   . Prostate cancer Paternal Grandfather   . Ovarian cancer Sister 65       found during her pregnancy  . Prostate cancer Paternal Uncle   . Prostate cancer Paternal Uncle   . Cancer Other   . Stroke Other   . Diabetes Other   . Coronary artery disease Other        Objective:    BP 116/74 (BP Location: Right Arm, Patient Position: Sitting, Cuff Size: Large)   Pulse 68   Temp 97.6 F (36.4 C) (Oral)   Resp 18   Ht '5\' 2"'$  (1.575 m)   Wt 226 lb 9.6 oz (102.8 kg)   LMP 05/13/2017   SpO2 95%   BMI 41.45 kg/m  Physical Exam  Constitutional: She is oriented to person, place, and time. She appears well-developed and well-nourished. No distress.  HENT:  Head: Normocephalic and atraumatic.  Right Ear: External ear normal.  Left Ear: External ear normal.  Nose: Nose normal.  Mouth/Throat: Oropharynx is clear and moist.  Eyes: Pupils are equal, round, and reactive to light. Conjunctivae and EOM are normal.   Neck: Normal range of motion. Neck supple. Carotid bruit is not present. No thyromegaly present.  Cardiovascular: Normal rate, regular rhythm, normal heart sounds and intact distal pulses.  Exam reveals no gallop and no friction rub.   No murmur heard. Pulmonary/Chest: Effort normal and breath sounds normal. She has no wheezes. She has no rales.  Abdominal: Soft. Bowel sounds are normal. She exhibits no distension and no mass. There is no tenderness. There is no rebound and no guarding.  Lymphadenopathy:    She has no cervical adenopathy.  Neurological: She is alert and oriented to  person, place, and time. No cranial nerve deficit. She exhibits normal muscle tone. Coordination normal.  Skin: Skin is warm and dry. No rash noted. She is not diaphoretic. No erythema. No pallor.  Psychiatric: She has a normal mood and affect. Her behavior is normal. Judgment and thought content normal.    No results found. Depression screen [facility] 2/9 05/22/2017 03/13/2017 01/21/2017 12/20/2016 11/06/2016  Decreased Interest 0 0 0 0 0  Down, Depressed, Hopeless 0 0 0 0 0  PHQ - 2 Score 0 0 0 0 0   Fall Risk  05/22/2017 03/13/2017 01/21/2017 12/20/2016 11/06/2016  Falls in the past year? _0         Assessment & Plan:   1. Essential hypertension, benign   2. Chronic migraine without aura without status migrainosus, not intractable   3. SVT (supraventricular tachycardia) (HCC)   4. Other vitamin B12 deficiency anemia   5. Menorrhagia with regular cycle   6. Iron deficiency anemia due to chronic blood loss   7. S/P laparoscopic sleeve gastrectomy May 2018   8. Morbid obesity ([place])    -migraines have recently worsened due to family stressors; add Cymbalta 16m daily for migraine prevention and treatment of underlying anxiety due to stress.  Continue Topamax and Metoprolol. -decrease Metoprolol to one daily due to orthostatic dizziness. Monitor BP. -obtain labs with dizziness orthostatic and menorrhagia; plans  to undergo uterine ablation in future. -recommend increasing water intake and stool softeners for constipation.  -continue vitamin supplementation per general surgery. -increase exercise for stress management and weight loss.   Orders Placed This Encounter  Procedures  . CBC with Differential/Platelet  . Comprehensive metabolic panel   Meds ordered this encounter  Medications  . DULoxetine (CYMBALTA) 30 MG capsule    Sig: Take 1 capsule (30 mg total) by mouth daily.    Dispense:  30 capsule    Refill:  3  . metoprolol tartrate (LOPRESSOR) 25 MG tablet    Sig: Take 2 tablets (50 mg total) by mouth daily.    Dispense:  360 tablet    Refill:  1    Return in about 2 months (around 07/22/2017) for recheck migraines, blood pressure.   Jacoria Keiffer MElayne Guerin M.D. [facility] [place]  [phone] ([phone]

## 2017-05-22 NOTE — Patient Instructions (Addendum)
Decrease Metoprolol to one daily. Start Cymbalta for stress and migraine prevention. Continue Topamax at current dose.  Migraine Headache A migraine headache is an intense, throbbing pain on one side or both sides of the head. Migraines may also cause other symptoms, such as nausea, vomiting, and sensitivity to light and noise. What are the causes? Doing or taking certain things may also trigger migraines, such as:  Alcohol.  Smoking.  Medicines, such as: ? Medicine used to treat chest pain (nitroglycerine). ? Birth control pills. ? Estrogen pills. ? Certain blood pressure medicines.  Aged cheeses, chocolate, or caffeine.  Foods or drinks that contain nitrates, glutamate, aspartame, or tyramine.  Physical activity.  Other things that may trigger a migraine include:  Menstruation.  Pregnancy.  Hunger.  Stress, lack of sleep, too much sleep, or fatigue.  Weather changes.  What increases the risk? The following factors may make you more likely to experience migraine headaches:  Age. Risk increases with age.  Family history of migraine headaches.  Being Caucasian.  Depression and anxiety.  Obesity.  Being a woman.  Having a hole in the heart (patent foramen ovale) or other heart problems.  What are the signs or symptoms? The main symptom of this condition is pulsating or throbbing pain. Pain may:  Happen in any area of the head, such as on one side or both sides.  Interfere with daily activities.  Get worse with physical activity.  Get worse with exposure to bright lights or loud noises.  Other symptoms may include:  Nausea.  Vomiting.  Dizziness.  General sensitivity to bright lights, loud noises, or smells.  Before you get a migraine, you may get warning signs that a migraine is developing (aura). An aura may include:  Seeing flashing lights or having blind spots.  Seeing bright spots, halos, or zigzag lines.  Having tunnel vision or  blurred vision.  Having numbness or a tingling feeling.  Having trouble talking.  Having muscle weakness.  How is this diagnosed? A migraine headache can be diagnosed based on:  Your symptoms.  A physical exam.  Tests, such as CT scan or MRI of the head. These imaging tests can help rule out other causes of headaches.  Taking fluid from the spine (lumbar puncture) and analyzing it (cerebrospinal fluid analysis, or CSF analysis).  How is this treated? A migraine headache is usually treated with medicines that:  Relieve pain.  Relieve nausea.  Prevent migraines from coming back.  Treatment may also include:  Acupuncture.  Lifestyle changes like avoiding foods that trigger migraines.  Follow these instructions at home: Medicines  Take over-the-counter and prescription medicines only as told by your health care provider.  Do not drive or use heavy machinery while taking prescription pain medicine.  To prevent or treat constipation while you are taking prescription pain medicine, your health care provider may recommend that you: ? Drink enough fluid to keep your urine clear or pale yellow. ? Take over-the-counter or prescription medicines. ? Eat foods that are high in fiber, such as fresh fruits and vegetables, whole grains, and beans. ? Limit foods that are high in fat and processed sugars, such as fried and sweet foods. Lifestyle  Avoid alcohol use.  Do not use any products that contain nicotine or tobacco, such as cigarettes and e-cigarettes. If you need help quitting, ask your health care provider.  Get at least 8 hours of sleep every night.  Limit your stress. General instructions   Keep a  journal to find out what may trigger your migraine headaches. For example, write down: ? What you eat and drink. ? How much sleep you get. ? Any change to your diet or medicines.  If you have a migraine: ? Avoid things that make your symptoms worse, such as bright  lights. ? It may help to lie down in a dark, quiet room. ? Do not drive or use heavy machinery. ? Ask your health care provider what activities are safe for you while you are experiencing symptoms.  Keep all follow-up visits as told by your health care provider. This is important. Contact a health care provider if:  You develop symptoms that are different or more severe than your usual migraine symptoms. Get help right away if:  Your migraine becomes severe.  You have a fever.  You have a stiff neck.  You have vision loss.  Your muscles feel weak or like you cannot control them.  You start to lose your balance often.  You develop trouble walking.  You faint. This information is not intended to replace advice given to you by your health care provider. Make sure you discuss any questions you have with your health care provider. Document Released: 09/22/2005 Document Revised: 04/11/2016 Document Reviewed: 03/10/2016 Elsevier Interactive Patient Education  2017 [employer].   IF you received an x-ray today, you will receive an invoice from [facility]. Please contact [facility] at [phone] with questions or concerns regarding your invoice.   IF you received labwork today, you will receive an invoice from [facility]. Please contact [facility] at [phone] with questions or concerns regarding your invoice.   Our billing staff will not be able to assist you with questions regarding bills from these companies.  You will be contacted with the lab results as soon as they are available. The fastest way to get your results is to activate your My Chart account. Instructions are located on the last page of this paperwork. If you have not heard from [place] regarding the results in 2 weeks, please contact this office.

## 2017-05-23 LAB — COMPREHENSIVE METABOLIC PANEL
A/G RATIO: 1.8 (ref 1.2–2.2)
ALK PHOS: 47 IU/L (ref 39–117)
ALT: 32 IU/L (ref 0–32)
AST: 21 IU/L (ref 0–40)
Albumin: 4.2 g/dL (ref 3.5–5.5)
BILIRUBIN TOTAL: 0.2 mg/dL (ref 0.0–1.2)
BUN/Creatinine Ratio: 24 — ABNORMAL HIGH (ref 9–23)
BUN: 20 mg/dL (ref 6–20)
CHLORIDE: 107 mmol/L — AB (ref 96–106)
CO2: 19 mmol/L — ABNORMAL LOW (ref 20–29)
Calcium: 9.4 mg/dL (ref 8.7–10.2)
Creatinine, Ser: 0.84 mg/dL (ref 0.57–1.00)
GFR calc Af Amer: 102 mL/min/{1.73_m2} (ref >59)
GFR calc non Af Amer: 88 mL/min/{1.73_m2} (ref >59)
GLUCOSE: 91 mg/dL (ref 65–99)
Globulin, Total: 2.3 g/dL (ref 1.5–4.5)
POTASSIUM: 3.9 mmol/L (ref 3.5–5.2)
Sodium: 142 mmol/L (ref 134–144)
Total Protein: 6.5 g/dL (ref 6.0–8.5)

## 2017-05-23 LAB — CBC WITH DIFFERENTIAL/PLATELET
BASOS: 0 %
Basophils Absolute: 0 10*3/uL (ref 0.0–0.2)
EOS (ABSOLUTE): 0.1 10*3/uL (ref 0.0–0.4)
Eos: 1 %
Hematocrit: 38 % (ref 34.0–46.6)
Hemoglobin: 12.3 g/dL (ref 11.1–15.9)
IMMATURE GRANULOCYTES: 0 %
Immature Grans (Abs): 0 10*3/uL (ref 0.0–0.1)
Lymphocytes Absolute: 2.1 10*3/uL (ref 0.7–3.1)
Lymphs: 29 %
MCH: 26.6 pg (ref 26.6–33.0)
MCHC: 32.4 g/dL (ref 31.5–35.7)
MCV: 82 fL (ref 79–97)
Monocytes Absolute: 0.4 10*3/uL (ref 0.1–0.9)
Monocytes: 5 %
NEUTROS PCT: 65 %
Neutrophils Absolute: 4.6 10*3/uL (ref 1.4–7.0)
PLATELETS: 361 10*3/uL (ref 150–379)
RBC: 4.63 x10E6/uL (ref 3.77–5.28)
RDW: 14.9 % (ref 12.3–15.4)
WBC: 7.1 10*3/uL (ref 3.4–10.8)

## 2017-06-15 ENCOUNTER — Encounter: Payer: 59 | Attending: Surgery | Admitting: Skilled Nursing Facility1

## 2017-06-15 ENCOUNTER — Encounter: Payer: Self-pay | Admitting: Skilled Nursing Facility1

## 2017-06-15 DIAGNOSIS — Z713 Dietary counseling and surveillance: Secondary | ICD-10-CM | POA: Diagnosis not present

## 2017-06-15 DIAGNOSIS — Z6841 Body Mass Index (BMI) 40.0 and over, adult: Secondary | ICD-10-CM | POA: Insufficient documentation

## 2017-06-15 NOTE — Progress Notes (Signed)
sleeve Surgery  Primary concerns today: Post-operative Bariatric Surgery Nutrition Management. Pt states she is only taking one blood pressure pill now. Pt states she does not like the kefir. Pt states she is tired due to working 3rd shift but changing to first shift the 18th. Pt states she tried some watermelon which filled her up.  Pt wanted to add other foods, to discuss next time due to lack of room in her stomach currently.   Surgery Date: 02/17/2017 Surgery type: Sleeve Gastrectomy  Start weight at [facility]: 289 Weight today: 214.4 Weight change: 23.6  TANITA  BODY COMP RESULTS  01/05/2017 03/04/2017 04/14/2017 06/15/2017   BMI (kg/m^2) 53.7 48.7 43.6 39.2   Fat Mass (lbs) 154 133.6 115.2 98.4   Fat Free Mass (lbs) 130 124.2 123.4 116   Total Body Water (lbs) 97.2 92 90.4 84.2   24-hr recall: 3-4 cheesesticks a day B (AM): 2 scrambled egg  (14g) Snk (AM): greek yogurt (15g) L (PM): chicken or egg or hamburger patty (21g) sometimes some vegetable Snk (PM): protein bars or greek yogurt D (PM): chicken or egg or hamburger patty (21g) and butternut squash Snk (PM):   Fluid intake: water, sometimes with flavorings, powerade zero: 64-96 fluid ounces Estimated total protein intake: 100  Medications: 1 blood pressure medication  Supplementation: celebrate capsule multi and calcium   Using straws: no Drinking while eating: no Having you been chewing well: yes  Chewing/swallowing difficulties: no Changes in vision: no Changes to mood/headaches: no Hair loss/Changes to skin/Changes to nails: a little more shedding: pt thinks it is lack of biotin  Any difficulty focusing or concentrating: no Sweating: no Dizziness/Lightheaded: no Palpitations: no  Carbonated beverages: no N/V/D/C/GAS: bowel movements better now every couple days over 1 week Abdominal Pain: no Dumping syndrome: no  Recent physical activity: gym at work and treadmill/weights at home: 2-3 days a week for 60-90  minutes   Progress Towards Goal(s):  In progress.   Nutritional Diagnosis:  [place]-3.3 Overweight/obesity related to past poor dietary habits and physical inactivity as evidenced by patient w/ recent sleeve surgery following dietary guidelines for continued weight loss.    Intervention:  Nutrition counseling. Dietitian educated the pt on advancing her diet to include non-starchy vegetables. Goals: -Keep up the great work!  Teaching Method Utilized:  Visual Auditory Hands on  Barriers to learning/adherence to lifestyle change: none identified   Demonstrated degree of understanding via:  Teach Back   Monitoring/Evaluation:  Dietary intake, exercise, and body weight.

## 2017-06-16 ENCOUNTER — Ambulatory Visit: Payer: Self-pay | Admitting: Skilled Nursing Facility1

## 2017-06-18 ENCOUNTER — Encounter: Payer: Self-pay | Admitting: Family Medicine

## 2017-06-18 ENCOUNTER — Ambulatory Visit (INDEPENDENT_AMBULATORY_CARE_PROVIDER_SITE_OTHER): Payer: 59 | Admitting: Family Medicine

## 2017-06-18 VITALS — BP 122/81 | HR 111 | Temp 98.0°F | Resp 17 | Ht 62.0 in | Wt 211.6 lb

## 2017-06-18 DIAGNOSIS — J029 Acute pharyngitis, unspecified: Secondary | ICD-10-CM

## 2017-06-18 LAB — POCT RAPID STREP A (OFFICE): Rapid Strep A Screen: NEGATIVE

## 2017-06-18 MED ORDER — AZITHROMYCIN 250 MG PO TABS: ORAL_TABLET | ORAL | 0 refills | Status: DC

## 2017-06-18 NOTE — Progress Notes (Signed)
Chief Complaint  Patient presents with  . Sore Throat    onset: 06/17/17, red and swollen tonsils with white patches on them, hard to swallow , bodyaches and chills, no temps taken.  Tylenol last taken last night and throat lozengers for throat    HPI   Pt with history of red swollen tender glands with white patches that she can see  She also has pain with swallowing and myalgia She has chills but no fevers  She tired Tylenol OTC and lozenges to soothe her throat She is also drink plenty of water Her sick contacts are- she works in [employer] surgical unit   Past Medical History:  Diagnosis Date  . Anemia   . Anxiety   . Arthritis    L knee OA  . Depression    history of depression in 2011/01/09 with death of mother  . Hypertension   . Migraine     Current Outpatient Prescriptions  Medication Sig Dispense Refill  . Biotin 10000 MCG TABS Take 1 tablet by mouth daily.    . DULoxetine (CYMBALTA) 30 MG capsule Take 1 capsule (30 mg total) by mouth daily. 30 capsule 3  . metoprolol tartrate (LOPRESSOR) 25 MG tablet Take 2 tablets (50 mg total) by mouth daily. 360 tablet 1  . Multiple Vitamins-Minerals (MULTIVITAMIN WITH MINERALS) tablet Take 1 tablet by mouth daily.    Marland Kitchen topiramate (TOPAMAX) 100 MG tablet Take 2 tablets (200 mg total) by mouth 2 (two) times daily. 360 tablet 1  . vitamin B-12 1000 MCG tablet Take 1 tablet (1,000 mcg total) by mouth daily. 30 tablet 1  . azithromycin (ZITHROMAX) 250 MG tablet Take two tablets on day one then one tablet each day 6 tablet 0  . IRON PO Take 65 mg by mouth 2 (two) times daily.      Current Facility-Administered Medications  Medication Dose Route Frequency Provider Last Rate Last Dose  . cyanocobalamin ((VITAMIN B-12)) injection 1,000 mcg  1,000 mcg Intramuscular Q30 days Wardell Honour, MD   1,000 mcg at 01/02/17 0940    Allergies:  Allergies  Allergen Reactions  . Imitrex [Sumatriptan] Shortness Of Breath  . Zomig [Zolmitriptan] Shortness  Of Breath  . Latex Hives and Swelling  . Sulfa Antibiotics Other (See Comments)    Unknown. Since childhood.     Past Surgical History:  Procedure Laterality Date  . CESAREAN SECTION    . CHOLECYSTECTOMY    . HAND SURGERY    . KNEE SURGERY    . LAPAROSCOPIC GASTRIC SLEEVE RESECTION N/A 02/17/2017   Procedure: LAPAROSCOPIC GASTRIC SLEEVE RESECTION, UPPER ENDO;  Surgeon: Johnathan Hausen, MD;  Location: [facility];  Service: General;  Laterality: N/A;  . LAPAROSCOPIC UNILATERAL SALPINGECTOMY Left 06/10/2014   Procedure: LAPAROSCOPIC Left SALPINGECTOMY, removal of ectopic pregnancy;  Surgeon: Guss Bunde, MD;  Location: [facility];  Service: Gynecology;  Laterality: Left;  . TUBAL LIGATION    . WISDOM TOOTH EXTRACTION      Social History   Social History  . Marital status: Married    Spouse name: N/A  . Number of children: 2  . Years of education: N/A   Occupational History  . nurse tech [employer]   Social History Main Topics  . Smoking status: Never Smoker  . Smokeless tobacco: Never Used  . Alcohol use Yes     Comment: socially  . Drug use: No  . Sexual activity: No   Other Topics Concern  .  None   Social History Narrative   Marital status: married x 6 years; second marriage      Children: 2 children (78, 57); 1 stepgrandaughter; 1 stepdaughter      Lives: with husband, one son      Employment: Chartered certified accountant since 2017; fifth floor surgical floor; third shift 3 twelve hour shifts      Tobacco: none      Alcohol: none      Drugs; none      Exercise: none    ROS See hpi  Objective: Vitals:   06/18/17 1056  BP: 122/81  Pulse: (!) 111  Resp: 17  Temp: 98 F (36.7 C)  TempSrc: Oral  SpO2: 98%  Weight: 211 lb 9.6 oz (96 kg)  Height: 5\' 2"  (1.575 m)    Physical Exam General: alert, oriented, in NAD Head: normocephalic, atraumatic, no sinus tenderness Eyes: EOM intact, no scleral icterus or conjunctival injection Ears: TM clear bilaterally Nose:  mucosa nonerythematous, nonedematous Throat: no pharyngeal exudate, + erythema Lymph: no posterior auricular, submental or cervical lymph adenopathy Heart: normal rate, normal sinus rhythm, no murmurs Lungs: clear to auscultation bilaterally, no wheezing  Rapid strep negative  Assessment and Plan Tanya [name] was seen today for sore throat.  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

## 2017-06-18 NOTE — Patient Instructions (Addendum)
     IF you received an x-ray today, you will receive an invoice from [facility]. Please contact [facility] at [phone] with questions or concerns regarding your invoice.   IF you received labwork today, you will receive an invoice from [facility]. Please contact [facility] at [phone] with questions or concerns regarding your invoice.   Our billing staff will not be able to assist you with questions regarding bills from these companies.  You will be contacted with the lab results as soon as they are available. The fastest way to get your results is to activate your My Chart account. Instructions are located on the last page of this paperwork. If you have not heard from [place] regarding the results in 2 weeks, please contact this office.    Pharyngitis Pharyngitis is redness, pain, and swelling (inflammation) of your pharynx. What are the causes? Pharyngitis is usually caused by infection. Most of the time, these infections are from viruses (viral) and are part of a cold. However, sometimes pharyngitis is caused by bacteria (bacterial). Pharyngitis can also be caused by allergies. Viral pharyngitis may be spread from person to person by coughing, sneezing, and personal items or utensils (cups, forks, spoons, toothbrushes). Bacterial pharyngitis may be spread from person to person by more intimate contact, such as kissing. What are the signs or symptoms? Symptoms of pharyngitis include:  Sore throat.  Tiredness (fatigue).  Low-grade fever.  Headache.  Joint pain and muscle aches.  Skin rashes.  Swollen lymph nodes.  Plaque-like film on throat or tonsils (often seen with bacterial pharyngitis).  How is this diagnosed? Your health care provider will ask you questions about your illness and your symptoms. Your medical history, along with a physical exam, is often all that is needed to diagnose pharyngitis. Sometimes, a rapid strep test is done. Other lab tests may  also be done, depending on the suspected cause. How is this treated? Viral pharyngitis will usually get better in 3-4 days without the use of medicine. Bacterial pharyngitis is treated with medicines that kill germs (antibiotics). Follow these instructions at home:  Drink enough water and fluids to keep your urine clear or pale yellow.  Only take over-the-counter or prescription medicines as directed by your health care provider: ? If you are prescribed antibiotics, make sure you finish them even if you start to feel better. ? Do not take aspirin.  Get lots of rest.  Gargle with 8 oz of salt water ( tsp of salt per 1 qt of water) as often as every 1-2 hours to soothe your throat.  Throat lozenges (if you are not at risk for choking) or sprays may be used to soothe your throat. Contact a health care provider if:  You have large, tender lumps in your neck.  You have a rash.  You cough up green, yellow-brown, or bloody spit. Get help right away if:  Your neck becomes stiff.  You drool or are unable to swallow liquids.  You vomit or are unable to keep medicines or liquids down.  You have severe pain that does not go away with the use of recommended medicines.  You have trouble breathing (not caused by a stuffy nose). This information is not intended to replace advice given to you by your health care provider. Make sure you discuss any questions you have with your health care provider. Document Released: 09/22/2005 Document Revised: 02/28/2016 Document Reviewed: 05/30/2013 Elsevier Interactive Patient Education  2017 [employer].

## 2017-06-29 MED FILL — AZITHROMYCIN 250 MG TAB: 250 | 5 days supply | Qty: 6 | Fill #0

## 2017-07-15 ENCOUNTER — Ambulatory Visit (INDEPENDENT_AMBULATORY_CARE_PROVIDER_SITE_OTHER): Payer: 59 | Admitting: Family Medicine

## 2017-07-15 ENCOUNTER — Encounter: Payer: Self-pay | Admitting: Family Medicine

## 2017-07-15 VITALS — BP 112/70 | HR 93 | Temp 98.0°F | Resp 16 | Ht 62.6 in | Wt 204.0 lb

## 2017-07-15 DIAGNOSIS — Z23 Encounter for immunization: Secondary | ICD-10-CM | POA: Diagnosis not present

## 2017-07-15 DIAGNOSIS — K5901 Slow transit constipation: Secondary | ICD-10-CM | POA: Diagnosis not present

## 2017-07-15 DIAGNOSIS — K644 Residual hemorrhoidal skin tags: Secondary | ICD-10-CM

## 2017-07-15 DIAGNOSIS — R300 Dysuria: Secondary | ICD-10-CM | POA: Diagnosis not present

## 2017-07-15 LAB — POCT URINALYSIS DIP (MANUAL ENTRY)
BILIRUBIN UA: NEGATIVE
Glucose, UA: NEGATIVE mg/dL
Ketones, POC UA: NEGATIVE mg/dL
Leukocytes, UA: NEGATIVE
NITRITE UA: NEGATIVE
PH UA: 6.5 (ref 5.0–8.0)
Protein Ur, POC: NEGATIVE mg/dL
SPEC GRAV UA: 1.02 (ref 1.010–1.025)
UROBILINOGEN UA: 0.2 U/dL

## 2017-07-15 MED ORDER — AMOXICILLIN-POT CLAVULANATE 875-125 MG PO TABS: 1.0000 | ORAL_TABLET | Freq: Two times a day (BID) | ORAL | 0 refills | Status: DC

## 2017-07-15 MED ORDER — HYDROCORTISONE ACETATE 25 MG RE SUPP: 25.0000 mg | Freq: Two times a day (BID) | RECTAL | 0 refills | Status: DC

## 2017-07-15 MED FILL — AMOX TR-K CLV 875-125 MG TA: 875-125 | 7 days supply | Qty: 14 | Fill #0

## 2017-07-15 MED FILL — HYDROCORTISONE AC 25 MG SUP: 25 | 6 days supply | Qty: 12 | Fill #0

## 2017-07-15 NOTE — Progress Notes (Signed)
 Subjective:    Patient ID: Tanya [name], female    DOB: 05/29/1981, 36 y.o.   MRN: [id number]  07/15/2017  Dysuria (with frequency )    HPI This 36 y.o. female presents for evaluation urinary frequency, dysuria.  Three weeks with onset.  Working excessively.  Taking cranberry pills.  Dysuria with urination; when urinates, stomach hurts. Urinary frequency.  Nocturia x 2-3. Baseline x 1.  No fever/chills/sweats.  No nausea or vomiting. No significant back pain.  Suprapubic pain.  Constipation.  Urinary incontinence chronic with worsening; wearing panty liners.  No vaginal discharge; no vaginal pain; no vaginal itching.  Worse at work.    Has lost 83 pounds in five months.  Goal weight 150 pounds. Has not returned to [place].  Rectal swelling/protrusion/burning/itching; no bleeding.  Constipation. Generic Colace; Will take Miralax.    BP Readings from Last 3 Encounters:  07/15/17 112/70  06/18/17 122/81  05/22/17 116/74   Wt Readings from Last 3 Encounters:  07/15/17 204 lb (92.5 kg)  06/18/17 211 lb 9.6 oz (96 kg)  06/15/17 214 lb 6.4 oz (97.3 kg)   Immunization History  Administered Date(s) Administered  . Influenza-Unspecified 07/06/2016    Review of Systems  Constitutional: Negative for chills, diaphoresis, fatigue and fever.  Eyes: Negative for visual disturbance.  Respiratory: Negative for cough and shortness of breath.   Cardiovascular: Negative for chest pain, palpitations and leg swelling.  Gastrointestinal: Positive for constipation and rectal pain. Negative for abdominal distention, abdominal pain, anal bleeding, blood in stool, diarrhea, nausea and vomiting.  Endocrine: Negative for cold intolerance, heat intolerance, polydipsia, polyphagia and polyuria.  Genitourinary: Positive for dysuria, frequency and urgency. Negative for decreased urine volume, flank pain, genital sores, hematuria, menstrual problem, pelvic pain, vaginal bleeding, vaginal discharge and  vaginal pain.  Neurological: Negative for dizziness, tremors, seizures, syncope, facial asymmetry, speech difficulty, weakness, light-headedness, numbness and headaches.    Past Medical History:  Diagnosis Date  . Anemia   . Anxiety   . Arthritis    L knee OA  . Depression    history of depression in 2012 with death of mother  . Hypertension   . Migraine    Past Surgical History:  Procedure Laterality Date  . CESAREAN SECTION    . CHOLECYSTECTOMY    . HAND SURGERY    . KNEE SURGERY    . LAPAROSCOPIC GASTRIC SLEEVE RESECTION N/A 02/17/2017   Procedure: LAPAROSCOPIC GASTRIC SLEEVE RESECTION, UPPER ENDO;  Surgeon: Martin, Matthew, MD;  Location: [facility];  Service: General;  Laterality: N/A;  . LAPAROSCOPIC UNILATERAL SALPINGECTOMY Left 06/10/2014   Procedure: LAPAROSCOPIC Left SALPINGECTOMY, removal of ectopic pregnancy;  Surgeon: Kelly H Leggett, MD;  Location: [facility];  Service: Gynecology;  Laterality: Left;  . TUBAL LIGATION    . WISDOM TOOTH EXTRACTION     Allergies  Allergen Reactions  . Imitrex [Sumatriptan] Shortness Of Breath  . Zomig [Zolmitriptan] Shortness Of Breath  . Latex Hives and Swelling  . Sulfa Antibiotics Other (See Comments)    Unknown. Since childhood.    Current Outpatient Prescriptions on File Prior to Visit  Medication Sig Dispense Refill  . Biotin 10000 MCG TABS Take 1 tablet by mouth daily.    . metoprolol tartrate (LOPRESSOR) 25 MG tablet Take 2 tablets (50 mg total) by mouth daily. 360 tablet 1  . Multiple Vitamins-Minerals (MULTIVITAMIN WITH MINERALS) tablet Take 1 tablet by mouth daily.    . topiramate (TOPAMAX) 100 MG tablet Take 2   tablets (200 mg total) by mouth 2 (two) times daily. 360 tablet 1  . vitamin B-12 1000 MCG tablet Take 1 tablet (1,000 mcg total) by mouth daily. 30 tablet 1   Current Facility-Administered Medications on File Prior to Visit  Medication Dose Route Frequency Provider Last Rate Last Dose  . cyanocobalamin ((VITAMIN  B-12)) injection 1,000 mcg  1,000 mcg Intramuscular Q30 days ,  M, MD   1,000 mcg at 01/02/17 0940   Social History   Social History  . Marital status: Married    Spouse name: N/A  . Number of children: 2  . Years of education: N/A   Occupational History  . nurse tech [employer]   Social History Main Topics  . Smoking status: Never Smoker  . Smokeless tobacco: Never Used  . Alcohol use Yes     Comment: socially  . Drug use: No  . Sexual activity: No   Other Topics Concern  . Not on file   Social History Narrative   Marital status: married x 6 years; second marriage      Children: 2 children (20, 15); 1 stepgrandaughter; 1 stepdaughter      Lives: with husband, one son      Employment: nurse tech since 2017; fifth floor surgical floor; third shift 3 twelve hour shifts      Tobacco: none      Alcohol: none      Drugs; none      Exercise: none   Family History  Problem Relation Age of Onset  . Breast cancer Mother 57  . Cancer Mother   . Prostate cancer Father 63  . Breast cancer Maternal Aunt        dx in her 40s; BRCA neg  . Prostate cancer Paternal Uncle   . Prostate cancer Paternal Grandfather   . Ovarian cancer Sister 27       found during her pregnancy  . Prostate cancer Paternal Uncle   . Prostate cancer Paternal Uncle   . Cancer Other   . Stroke Other   . Diabetes Other   . Coronary artery disease Other        Objective:    BP 112/70   Pulse 93   Temp 98 F (36.7 C) (Oral)   Resp 16   Ht 5' 2.6" (1.59 m)   Wt 204 lb (92.5 kg)   LMP 06/18/2017   SpO2 98%   BMI 36.60 kg/m  Physical Exam  Constitutional: She is oriented to person, place, and time. She appears well-developed and well-nourished. No distress.  HENT:  Head: Normocephalic and atraumatic.  Eyes: Pupils are equal, round, and reactive to light. Conjunctivae are normal.  Neck: Normal range of motion. Neck supple.  Cardiovascular: Normal rate, regular rhythm  and normal heart sounds.  Exam reveals no gallop and no friction rub.   No murmur heard. Pulmonary/Chest: Effort normal and breath sounds normal. She has no wheezes. She has no rales.  Genitourinary:  Genitourinary Comments: REFUSES RECTAL EXAM DUE TO MENSES.  Neurological: She is alert and oriented to person, place, and time.  Skin: She is not diaphoretic.  Psychiatric: She has a normal mood and affect. Her behavior is normal.  Nursing note and vitals reviewed.  No results found. Depression screen PHQ 2/9 07/15/2017 06/18/2017 05/22/2017 03/13/2017 01/21/2017  Decreased Interest 0 0 0 0 0  Down, Depressed, Hopeless 0 0 0 0 0  PHQ - 2 Score 0 0 0 0 0     Fall Risk  07/15/2017 06/18/2017 05/22/2017 03/13/2017 01/21/2017  Falls in the past year? No No No No No   Results for orders placed or performed in visit on 07/15/17  POCT urinalysis dipstick  Result Value Ref Range   Color, UA yellow yellow   Clarity, UA cloudy (A) clear   Glucose, UA negative negative mg/dL   Bilirubin, UA negative negative   Ketones, POC UA negative negative mg/dL   Spec Grav, UA 1.020 1.010 - 1.025   Blood, UA large (A) negative   pH, UA 6.5 5.0 - 8.0   Protein Ur, POC negative negative mg/dL   Urobilinogen, UA 0.2 0.2 or 1.0 E.U./dL   Nitrite, UA Negative Negative   Leukocytes, UA Negative Negative        Assessment & Plan:   1. Dysuria   2. External hemorrhoid   3. Slow transit constipation   4. Need for prophylactic vaccination and inoculation against influenza     -New onset dysuria; send urine cx; treat with Augmentin. -New onset rectal itching/burning; consistent with hemorrhoids external; recent constipation due to bariatric surgery; recommend stool softeners; also provided with hydrocortisone suppositories; will warrant rectal exam at follow-up visit; pt refused today due to active menses.   Orders Placed This Encounter  Procedures  . Urine Culture    Order Specific Question:   Source     Answer:   clean catch  . Flu Vaccine QUAD 36+ mos IM  . POCT urinalysis dipstick   Meds ordered this encounter  Medications  . amoxicillin-clavulanate (AUGMENTIN) 875-125 MG tablet    Sig: Take 1 tablet by mouth 2 (two) times daily.    Dispense:  14 tablet    Refill:  0  . hydrocortisone (ANUSOL-HC) 25 MG suppository    Sig: Place 1 suppository (25 mg total) rectally 2 (two) times daily.    Dispense:  12 suppository    Refill:  0    No Follow-up on file.    Martin , M.D. [facility] [place]  [place] previously [facility] [address] [place], [place]  [address] [phone] phone [phone] fax  

## 2017-07-15 NOTE — Patient Instructions (Addendum)
   IF you received an x-ray today, you will receive an invoice from [facility]. Please contact [facility] at [phone] with questions or concerns regarding your invoice.   IF you received labwork today, you will receive an invoice from [facility]. Please contact [facility] at [phone] with questions or concerns regarding your invoice.   Our billing staff will not be able to assist you with questions regarding bills from these companies.  You will be contacted with the lab results as soon as they are available. The fastest way to get your results is to activate your My Chart account. Instructions are located on the last page of this paperwork. If you have not heard from us regarding the results in 2 weeks, please contact this office.     Urinary Tract Infection, Adult A urinary tract infection (UTI) is an infection of any part of the urinary tract, which includes the kidneys, ureters, bladder, and urethra. These organs make, store, and get rid of urine in the body. UTI can be a bladder infection (cystitis) or kidney infection (pyelonephritis). What are the causes? This infection may be caused by fungi, viruses, or bacteria. Bacteria are the most common cause of UTIs. This condition can also be caused by repeated incomplete emptying of the bladder during urination. What increases the risk? This condition is more likely to develop if:  You ignore your need to urinate or hold urine for long periods of time.  You do not empty your bladder completely during urination.  You wipe back to front after urinating or having a bowel movement, if you are female.  You are uncircumcised, if you are female.  You are constipated.  You have a urinary catheter that stays in place (indwelling).  You have a weak defense (immune) system.  You have a medical condition that affects your bowels, kidneys, or bladder.  You have diabetes.  You take antibiotic medicines frequently or  for long periods of time, and the antibiotics no longer work well against certain types of infections (antibiotic resistance).  You take medicines that irritate your urinary tract.  You are exposed to chemicals that irritate your urinary tract.  You are female. What are the signs or symptoms? Symptoms of this condition include:  Fever.  Frequent urination or passing small amounts of urine frequently.  Needing to urinate urgently.  Pain or burning with urination.  Urine that smells bad or unusual.  Cloudy urine.  Pain in the lower abdomen or back.  Trouble urinating.  Blood in the urine.  Vomiting or being less hungry than normal.  Diarrhea or abdominal pain.  Vaginal discharge, if you are female. How is this diagnosed? This condition is diagnosed with a medical history and physical exam. You will also need to provide a urine sample to test your urine. Other tests may be done, including:  Blood tests.  Sexually transmitted disease (STD) testing. If you have had more than one UTI, a cystoscopy or imaging studies may be done to determine the cause of the infections. How is this treated? Treatment for this condition often includes a combination of two or more of the following:  Antibiotic medicine.  Other medicines to treat less common causes of UTI.  Over-the-counter medicines to treat pain.  Drinking enough water to stay hydrated. Follow these instructions at home:  Take over-the-counter and prescription medicines only as told by your health care provider.  If you were prescribed an antibiotic, take it as told by your health care   provider. Do not stop taking the antibiotic even if you start to feel better.  Avoid alcohol, caffeine, tea, and carbonated beverages. They can irritate your bladder.  Drink enough fluid to keep your urine clear or pale yellow.  Keep all follow-up visits as told by your health care provider. This is important.  Make sure  to:  Empty your bladder often and completely. Do not hold urine for long periods of time.  Empty your bladder before and after sex.  Wipe from front to back after a bowel movement if you are female. Use each tissue one time when you wipe. Contact a health care provider if:  You have back pain.  You have a fever.  You feel nauseous or vomit.  Your symptoms do not get better after 3 days.  Your symptoms go away and then return. Get help right away if:  You have severe back pain or lower abdominal pain.  You are vomiting and cannot keep down any medicines or water. This information is not intended to replace advice given to you by your health care provider. Make sure you discuss any questions you have with your health care provider. Document Released: 07/02/2005 Document Revised: 03/05/2016 Document Reviewed: 08/13/2015 Elsevier Interactive Patient Education  2017 [employer]  

## 2017-07-16 LAB — URINE CULTURE

## 2017-07-22 ENCOUNTER — Ambulatory Visit: Payer: 59 | Admitting: Family Medicine

## 2017-07-30 ENCOUNTER — Encounter: Payer: Self-pay | Admitting: Family Medicine

## 2017-08-05 ENCOUNTER — Encounter: Payer: Self-pay | Admitting: Family Medicine

## 2017-08-08 ENCOUNTER — Encounter: Payer: Self-pay | Admitting: Family Medicine

## 2017-08-08 ENCOUNTER — Ambulatory Visit (INDEPENDENT_AMBULATORY_CARE_PROVIDER_SITE_OTHER): Payer: 59 | Admitting: Family Medicine

## 2017-08-08 VITALS — BP 108/68 | HR 71 | Temp 98.1°F | Resp 16 | Ht 62.21 in | Wt 198.0 lb

## 2017-08-08 DIAGNOSIS — K6289 Other specified diseases of anus and rectum: Secondary | ICD-10-CM | POA: Diagnosis not present

## 2017-08-08 DIAGNOSIS — K644 Residual hemorrhoidal skin tags: Secondary | ICD-10-CM

## 2017-08-08 DIAGNOSIS — R350 Frequency of micturition: Secondary | ICD-10-CM

## 2017-08-08 LAB — POCT WET + KOH PREP
TRICH BY WET PREP: ABSENT
YEAST BY KOH: ABSENT
Yeast by wet prep: ABSENT

## 2017-08-08 LAB — POCT URINALYSIS DIP (MANUAL ENTRY)
BILIRUBIN UA: NEGATIVE
BILIRUBIN UA: NEGATIVE mg/dL
GLUCOSE UA: NEGATIVE mg/dL
Leukocytes, UA: NEGATIVE
Nitrite, UA: NEGATIVE
Protein Ur, POC: NEGATIVE mg/dL
RBC UA: NEGATIVE
SPEC GRAV UA: 1.02 (ref 1.010–1.025)
Urobilinogen, UA: 0.2 E.U./dL
pH, UA: 6.5 (ref 5.0–8.0)

## 2017-08-08 MED ORDER — LIDOCAINE-HYDROCORTISONE ACE 2.8-0.55 % RE GEL: 1.0000 "application " | Freq: Two times a day (BID) | RECTAL | 0 refills | Status: DC

## 2017-08-08 MED ORDER — OXYBUTYNIN CHLORIDE ER 5 MG PO TB24: 5.0000 mg | ORAL_TABLET | Freq: Every day | ORAL | 1 refills | Status: DC

## 2017-08-08 MED ORDER — DILTIAZEM GEL 2 %: CUTANEOUS | 0 refills | Status: DC

## 2017-08-08 NOTE — Progress Notes (Signed)
Subjective:    Patient ID: Tanya [name], female    DOB: 06-11-1981, 36 y.o.   MRN: [id number]  08/08/2017  Urinary Frequency and Pelvic Pain (x 3 weeks )    HPI This 36 y.o. female presents for evaluation of 3-week duration of urinary frequency, dysuria, pelvic pain.  Denies fever, chills, sweats.  Denies nausea, vomiting, diarrhea, constipation.  Also having ongoing issues with external hemorrhoids causing severe pain and itching.  Suffering with severe pain with bowel movements.  Also having pain with intercourse.  Denies vaginal discharge or irregular vaginal bleeding.  Continues to suffer with heavy menses yet occurs at regular intervals.  Denies vaginal irritation or itching.  Denies abdominal pain.  Very frustrated with persistent symptoms.  Evaluated on July 15, 2018 for similar symptoms.  Urine culture negative at that visit.  Patient was prescribed Augmentin empirically while awaiting urine culture results.  Patient was advised to discontinue treatment due to the negative urine culture.  Patient also prescribed Anusol suppositories for external hemorrhoid treatment.  No improvement with Anusol suppositories.   BP Readings from Last 3 Encounters:  08/18/17 (!) 137/95  08/08/17 108/68  07/15/17 112/70   Wt Readings from Last 3 Encounters:  08/18/17 191 lb (86.6 kg)  08/08/17 198 lb (89.8 kg)  07/15/17 204 lb (92.5 kg)   Immunization History  Administered Date(s) Administered  . Influenza,inj,Quad PF,6+ Mos 07/15/2017  . Influenza-Unspecified 07/06/2016    Review of Systems  Constitutional: Negative for chills, diaphoresis, fatigue and fever.  Eyes: Negative for visual disturbance.  Respiratory: Negative for cough and shortness of breath.   Cardiovascular: Negative for chest pain, palpitations and leg swelling.  Gastrointestinal: Positive for rectal pain. Negative for abdominal distention, abdominal pain, anal bleeding, blood in stool, constipation, diarrhea, nausea  and vomiting.  Endocrine: Negative for cold intolerance, heat intolerance, polydipsia, polyphagia and polyuria.  Genitourinary: Positive for dyspareunia, dysuria, frequency, pelvic pain and urgency. Negative for decreased urine volume, difficulty urinating, enuresis, flank pain, genital sores, hematuria, menstrual problem, vaginal bleeding, vaginal discharge and vaginal pain.  Neurological: Negative for dizziness, tremors, seizures, syncope, facial asymmetry, speech difficulty, weakness, light-headedness, numbness and headaches.    Past Medical History:  Diagnosis Date  . Anemia   . Anxiety   . Arthritis    L knee OA  . Depression    history of depression in Dec 31, 2010 with death of mother  . Hypertension   . Migraine    Past Surgical History:  Procedure Laterality Date  . CESAREAN SECTION    . CHOLECYSTECTOMY    . HAND SURGERY    . KNEE SURGERY    . LAPAROSCOPIC GASTRIC SLEEVE RESECTION N/A 02/17/2017   Procedure: LAPAROSCOPIC GASTRIC SLEEVE RESECTION, UPPER ENDO;  Surgeon: Johnathan Hausen, MD;  Location: [facility];  Service: General;  Laterality: N/A;  . LAPAROSCOPIC UNILATERAL SALPINGECTOMY Left 06/10/2014   Procedure: LAPAROSCOPIC Left SALPINGECTOMY, removal of ectopic pregnancy;  Surgeon: Guss Bunde, MD;  Location: [facility];  Service: Gynecology;  Laterality: Left;  . TUBAL LIGATION    . WISDOM TOOTH EXTRACTION     Allergies  Allergen Reactions  . Imitrex [Sumatriptan] Shortness Of Breath  . Zomig [Zolmitriptan] Shortness Of Breath  . Latex Hives and Swelling  . Sulfa Antibiotics Other (See Comments)    Unknown. Since childhood.    Current Outpatient Medications on File Prior to Visit  Medication Sig Dispense Refill  . Biotin 10000 MCG TABS Take 1 tablet by mouth daily.    Marland Kitchen  metoprolol tartrate (LOPRESSOR) 25 MG tablet Take 2 tablets (50 mg total) by mouth daily. (Patient not taking: Reported on 08/18/2017) 360 tablet 1  . Multiple Vitamins-Minerals (MULTIVITAMIN WITH MINERALS)  tablet Take 1 tablet by mouth daily.    Marland Kitchen topiramate (TOPAMAX) 100 MG tablet Take 2 tablets (200 mg total) by mouth 2 (two) times daily. 360 tablet 1  . vitamin B-12 1000 MCG tablet Take 1 tablet (1,000 mcg total) by mouth daily. 30 tablet 1   Current Facility-Administered Medications on File Prior to Visit  Medication Dose Route Frequency Provider Last Rate Last Dose  . cyanocobalamin ((VITAMIN B-12)) injection 1,000 mcg  1,000 mcg Intramuscular Q30 days Wardell Honour, MD   1,000 mcg at 01/02/17 0940   Social History   Socioeconomic History  . Marital status: Married    Spouse name: Not on file  . Number of children: 2  . Years of education: Not on file  . Highest education level: Not on file  Social Needs  . Financial resource strain: Not on file  . Food insecurity - worry: Not on file  . Food insecurity - inability: Not on file  . Transportation needs - medical: Not on file  . Transportation needs - non-medical: Not on file  Occupational History  . Occupation: Research scientist (life sciences): [employer]  Tobacco Use  . Smoking status: Never Smoker  . Smokeless tobacco: Never Used  Substance and Sexual Activity  . Alcohol use: Yes    Comment: socially  . Drug use: No  . Sexual activity: No    Birth control/protection: None  Other Topics Concern  . Not on file  Social History Narrative   Marital status: married x 6 years; second marriage      Children: 2 children (20, 57); 1 stepgrandaughter; 1 stepdaughter      Lives: with husband, one son      Employment: Chartered certified accountant since 2017; fifth floor surgical floor; third shift 3 twelve hour shifts      Tobacco: none      Alcohol: none      Drugs; none      Exercise: none   Family History  Problem Relation Age of Onset  . Breast cancer Mother 54  . Cancer Mother   . Prostate cancer Father 57  . Breast cancer Maternal Aunt        dx in her 5s; BRCA neg  . Prostate cancer Paternal Uncle   . Prostate cancer Paternal  Grandfather   . Ovarian cancer Sister 55       found during her pregnancy  . Prostate cancer Paternal Uncle   . Prostate cancer Paternal Uncle   . Cancer Other   . Stroke Other   . Diabetes Other   . Coronary artery disease Other        Objective:    BP 108/68   Pulse 71   Temp 98.1 F (36.7 C) (Oral)   Resp 16   Ht 5' 2.21" (1.58 m)   Wt 198 lb (89.8 kg)   LMP 07/15/2017 (Approximate)   SpO2 100%   BMI 35.98 kg/m  Physical Exam  Constitutional: She is oriented to person, place, and time. She appears well-developed and well-nourished. No distress.  HENT:  Head: Normocephalic and atraumatic.  Right Ear: External ear normal.  Left Ear: External ear normal.  Nose: Nose normal.  Mouth/Throat: Oropharynx is clear and moist.  Eyes: Pupils are equal, round, and  reactive to light. Conjunctivae and EOM are normal.  Neck: Normal range of motion. Neck supple. Carotid bruit is not present. No thyromegaly present.  Cardiovascular: Normal rate, regular rhythm, normal heart sounds and intact distal pulses.  Exam reveals no gallop and no friction rub.   No murmur heard. Pulmonary/Chest: Effort normal and breath sounds normal. She has no wheezes. She has no rales.  Abdominal: Soft. Bowel sounds are normal. She exhibits no distension and no mass. There is no tenderness. There is no rebound and no guarding.  Genitourinary: Vagina normal. Rectal exam shows external hemorrhoid and tenderness. Rectal exam shows no fissure. There is no rash, tenderness or lesion on the right labia. There is no rash or lesion on the left labia. Uterus is tender. Cervix exhibits no motion tenderness, no discharge and no friability. Right adnexum displays no mass, no tenderness and no fullness. Left adnexum displays no mass, no tenderness and no fullness. No vaginal discharge found.  Genitourinary Comments: Non-tender, non-inflamed external hemorrhoid; +TTP with rectal exam inferiorly.  No fluctuance.    Lymphadenopathy:    She has no cervical adenopathy.  Neurological: She is alert and oriented to person, place, and time. No cranial nerve deficit.  Skin: Skin is warm and dry. No rash noted. She is not diaphoretic. No erythema. No pallor.  Psychiatric: She has a normal mood and affect. Her behavior is normal.   No results found. Depression screen [facility] 2/9 08/08/2017 07/15/2017 06/18/2017 05/22/2017 03/13/2017  Decreased Interest 0 0 0 0 0  Down, Depressed, Hopeless 0 0 0 0 0  PHQ - 2 Score 0 0 0 0 0   Fall Risk  08/08/2017 07/15/2017 06/18/2017 05/22/2017 03/13/2017  Falls in the past year? No No No No No        Assessment & Plan:   1. Frequency of urination   2. Rectal pain   3. External hemorrhoid    Persistent urinary area frequency despite treatment for urinary tract infection.  Urine culture negative at previous visit 1 month ago.  We will send repeat urine culture today however low likelihood of UTI at this time.  Patient suffering with worsening rectal plan pain since last visit.  There is a presence of an external hemorrhoid that is nontender and noninflamed on rectal exam.  Concern for anal fissure.  Currently no symptoms suggestive of rectal abscess as patient does not have fever chills sweats or fluctuance on rectal exam.  Continue Anusol suppositories and stool softeners.  We will add diltiazem gel for any rectal spasm that may be occurring.  Refer to gastroenterology as well.  If urinary frequency does not improve with treatment of rectal symptoms, will also warrant referral to urology.  Very likely that rectal inflammation and pain is affecting urination.  Will also treat with Lidococaine gel and oxybutynin.   Orders Placed This Encounter  Procedures  . Urine Culture  . Ambulatory referral to Gastroenterology    Referral Priority:   Routine    Referral Type:   Consultation    Referral Reason:   Specialty Services Required    Number of Visits Requested:   1  . POCT  urinalysis dipstick  . POCT Wet + KOH Prep   Meds ordered this encounter  Medications  . diltiazem 2 % GEL    Sig: Apply a pea-sized amount to the affected area 3 times daily.    Dispense:  30 g    Refill:  0  . Lidocaine-Hydrocortisone Ace 2.8-0.55 %  GEL    Sig: Place 1 application rectally 2 (two) times daily.    Dispense:  100 g    Refill:  0  . oxybutynin (DITROPAN-XL) 5 MG 24 hr tablet    Sig: Take 1 tablet (5 mg total) by mouth at bedtime.    Dispense:  30 tablet    Refill:  1    No Follow-up on file.   Bethani Brugger Elayne Guerin, M.D. Primary Care at [facility] [address] [place], [place]  [address] [phone] phone [phone] fax

## 2017-08-08 NOTE — Patient Instructions (Addendum)
IF you received an x-ray today, you will receive an invoice from [facility]. Please contact [facility] at [phone] with questions or concerns regarding your invoice.   IF you received labwork today, you will receive an invoice from [facility]. Please contact [facility] at [phone] with questions or concerns regarding your invoice.   Our billing staff will not be able to assist you with questions regarding bills from these companies.  You will be contacted with the lab results as soon as they are available. The fastest way to get your results is to activate your My Chart account. Instructions are located on the last page of this paperwork. If you have not heard from [place] regarding the results in 2 weeks, please contact this office.     Anal Fissure, Adult An anal fissure is a small tear or crack in the skin around the anus. Bleeding from a fissure usually stops on its own within a few minutes. However, bleeding will often occur again with each bowel movement until the crack heals. What are the causes? This condition may be caused by:  Passing large, hard stool (feces).  Frequent diarrhea.  Constipation.  Inflammatory bowel disease (Crohn disease or ulcerative colitis).  Infections.  Anal sex.  What are the signs or symptoms? Symptoms of this condition include:  Bleeding from the rectum.  Small amounts of blood seen on your stool, on toilet paper, or in the toilet after a bowel movement.  Painful bowel movements.  Itching or irritation around the anus.  How is this diagnosed? A health care provider may diagnose this condition by closely examining the anal area. An anal fissure can usually be seen with careful inspection. In some cases, a rectal exam may be performed, or a short tube (anoscope) may be used to examine the anal canal. How is this treated? Treatment for this condition may include:  Taking steps to avoid constipation. This may include  making changes to your diet, such as increasing your intake of fiber or fluid.  Taking fiber supplements. These supplements can soften your stool to help make bowel movements easier. Your health care provider may also prescribe a stool softener if your stool is often hard.  Taking sitz baths. This may help to heal the tear.  Using medicated creams or ointments. These may be prescribed to lessen discomfort.  Follow these instructions at home: Eating and drinking  Avoid foods that may be constipating, such as bananas and dairy products.  Drink enough fluid to keep your urine clear or pale yellow.  Maintain a diet that is high in fruits, whole grains, and vegetables. General instructions  Keep the anal area as clean and dry as possible.  Take sitz baths as told by your health care provider. Do not use soap in the sitz baths.  Take over-the-counter and prescription medicines only as told by your health care provider.  Use creams or ointments only as told by your health care provider.  Keep all follow-up visits as told by your health care provider. This is important. Contact a health care provider if:  You have more bleeding.  You have a fever.  You have diarrhea that is mixed with blood.  You continue to have pain.  Your problem is getting worse rather than better. This information is not intended to replace advice given to you by your health care provider. Make sure you discuss any questions you have with your health care provider. Document Released: 09/22/2005 Document Revised: 01/30/2016  Document Reviewed: 12/18/2014 Elsevier Interactive Patient Education  [employer].

## 2017-08-09 LAB — URINE CULTURE: ORGANISM ID, BACTERIA: NO GROWTH

## 2017-08-10 MED FILL — LIDOCAINE-HC 2.8-0.55% GEL: 2.8-0.55 | 30 days supply | Qty: 100 | Fill #0

## 2017-08-10 MED FILL — TOPIRAMATE 100 MG TABLET: 100 | 60 days supply | Qty: 240 | Fill #1

## 2017-08-18 ENCOUNTER — Ambulatory Visit: Payer: Self-pay | Admitting: *Deleted

## 2017-08-18 ENCOUNTER — Encounter (HOSPITAL_COMMUNITY): Payer: Self-pay | Admitting: Emergency Medicine

## 2017-08-18 ENCOUNTER — Emergency Department (HOSPITAL_COMMUNITY)
Admission: EM | Admit: 2017-08-18 | Discharge: 2017-08-19 | Disposition: A | Discharge status: Home / Self Care | Payer: 59 | Attending: Emergency Medicine | Admitting: Emergency Medicine

## 2017-08-18 DIAGNOSIS — Z79899 Other long term (current) drug therapy: Secondary | ICD-10-CM | POA: Diagnosis not present

## 2017-08-18 DIAGNOSIS — K644 Residual hemorrhoidal skin tags: Secondary | ICD-10-CM | POA: Diagnosis not present

## 2017-08-18 DIAGNOSIS — I10 Essential (primary) hypertension: Secondary | ICD-10-CM | POA: Diagnosis not present

## 2017-08-18 DIAGNOSIS — K6289 Other specified diseases of anus and rectum: Secondary | ICD-10-CM | POA: Diagnosis not present

## 2017-08-18 MED ORDER — HYDROCORTISONE ACETATE 25 MG RE SUPP: 25.0000 mg | Freq: Two times a day (BID) | RECTAL | 0 refills | Status: DC

## 2017-08-18 MED ORDER — IBUPROFEN 800 MG PO TABS
800.0000 mg | ORAL_TABLET | Freq: Once | ORAL | Status: DC
  Filled 2017-08-18: qty 1

## 2017-08-18 NOTE — Telephone Encounter (Signed)
   Reason for Disposition . SEVERE rectal pain (e.g., excruciating, unable to have a bowel movement)  Answer Assessment - Initial Assessment Questions 1. SYMPTOM:  "What's the main symptom you're concerned about?" (e.g., pain, itching, swelling, rash)     Hemorrhoids are hard and painful- patient states the medication is not helping- it burns 2. ONSET: "When did the ________  start?"     Patient had bowel movement this morning that started this severe pain 3. RECTAL PAIN: "Do you have any pain around your rectum?" "How bad is the pain?"  (Scale 1-10; or mild, moderate, severe)  - MILD (1-3): doesn't interfere with normal activities   - MODERATE (4-7): interferes with normal activities or awakens from sleep, limping   - SEVERE (8-10): excruciating pain, unable to have a bowel movement      Severe-10 4. RECTAL ITCHING: "Do you have any itching in this area?" "How bad is the itching?"  (Scale 1-10; or mild, moderate, severe)  - MILD - doesn't interfere with normal activities   - MODERATE-SEVERE: interferes with normal activities or awakens from sleep     no 5. CONSTIPATION: "Do you have constipation?" If so, "How bad is it?"     Patient uses stool softeners 6. CAUSE: "What do you think is causing the anus symptoms?"     hemorrhoids 7. OTHER SYMPTOMS: "Do you have any other symptoms?"  (e.g., rectal bleeding, abdominal pain, vomiting, fever)     Bleeding with bowel movement 8. PREGNANCY: "Is there any chance you are pregnant?" "When was your last menstrual period?"     N/a- BTL  Protocols used: RECTAL [facility]  Patient going to ED

## 2017-08-18 NOTE — ED Triage Notes (Signed)
Patient reports that was told to come to ED for possible thrombosis hemorrhoids due to pain and hardness. Patient reports that been severe pain since around 11am ish after having BM.  Patient takes stool softners. Patient saw PCP week ago and was given cream but reports that when she puts cream on she has extreme pain.

## 2017-08-18 NOTE — ED Provider Notes (Signed)
[facility] DEPT Provider Note   CSN: [id number] Arrival date & time: 08/18/17  4888     History   Chief Complaint Chief Complaint  Patient presents with  . Hemorrhoids    HPI Tanya [name] is a 36 y.o. female.  HPI   Tanya [name] is a 36yo female with a history of migraines, iron deficiency anemia, anxiety, depression, hypertension who presents the emergency department for evaluation of painful hemorrhoids.  She states that she has had intermittent pain with hemorrhoids for the last 2 months now.  Was seen by her primary care doctor last week who told her to take sitz baths and prescribed her lidocaine-hydrocortisone cream for symptomatic relief. She states that she tried topical lidocaine-hydrocortisone cream, but it burned and she subsequently stopped using it. She states that she takes daily stool softeners, has soft stools but continues to have pain.  Today her pain was acutely worsened after she had a bowel movement.  States that she now has 10/10 severity "throbbing" rectal pain. Denies fever, abdominal pain, N/V, diarrhea, dysuria, urinary frequency, vaginal discharge, hematochezia, melena, chest pain, shortness of breath.    Past Medical History:  Diagnosis Date  . Anemia   . Anxiety   . Arthritis    L knee OA  . Depression    history of depression in November 03, 2010 with death of mother  . Hypertension   . Migraine     Patient Active Problem List   Diagnosis Date Noted  . S/P laparoscopic sleeve gastrectomy May 2018 02/17/2017  . Iron deficiency anemia due to chronic blood loss 02/01/2017  . SVT (supraventricular tachycardia) ([place])   . Anemia due to vitamin B12 deficiency 12/08/2016  . Menometrorrhagia 12/05/2016  . Morbid obesity ([place]) 12/05/2016  . Chronic migraine without aura without status migrainosus, not intractable 12/05/2016  . Ruptured left tubal ectopic pregnancy causing hemoperitoneum 06/10/2014    Past Surgical History:    Procedure Laterality Date  . CESAREAN SECTION    . CHOLECYSTECTOMY    . HAND SURGERY    . KNEE SURGERY    . TUBAL LIGATION    . WISDOM TOOTH EXTRACTION      OB History    Gravida Para Term Preterm AB Living   _0 SAB TAB Ectopic Multiple Live Births           2       Home Medications    Prior to Admission medications   Medication Sig Start Date End Date Taking? Authorizing Provider  Biotin 10000 MCG TABS Take 1 tablet by mouth daily.   Yes [provider]  CALCIUM PO Take 1 tablet 3 (three) times daily by mouth.   Yes [provider]  Lidocaine-Hydrocortisone Ace 2.8-0.55 % GEL Place 1 application rectally 2 (two) times daily. 08/08/17  Yes Wardell Honour, MD  Multiple Vitamins-Minerals (MULTIVITAMIN WITH MINERALS) tablet Take 1 tablet by mouth daily.   Yes [provider]  oxybutynin (DITROPAN-XL) 5 MG 24 hr tablet Take 1 tablet (5 mg total) by mouth at bedtime. 08/08/17  Yes Wardell Honour, MD  topiramate (TOPAMAX) 100 MG tablet Take 2 tablets (200 mg total) by mouth 2 (two) times daily. 12/20/16  Yes Wardell Honour, MD  vitamin B-12 1000 MCG tablet Take 1 tablet (1,000 mcg total) by mouth daily. 12/13/16  Yes Barton Dubois, MD  azithromycin [facility]) 250 MG tablet  06/29/17   [provider]  diltiazem 2 % GEL Apply a pea-sized amount to the affected area 3 times daily. 08/08/17   Smith, Kristi M, MD  hydrocortisone (ANUSOL-HC) 25 MG suppository  07/15/17   [provider]  hydrocortisone (ANUSOL-HC) 25 MG suppository Place 1 suppository (25 mg total) 2 (two) times daily rectally. 08/18/17   Shrosbree, Emily J, PA-C  metoprolol tartrate (LOPRESSOR) 25 MG tablet Take 2 tablets (50 mg total) by mouth daily. Patient not taking: Reported on 08/18/2017 05/22/17   Smith, Kristi M, MD    Family History Family History  Problem Relation Age of Onset  . Breast cancer Mother 57  . Cancer Mother   . Prostate cancer Father 63  .  Breast cancer Maternal Aunt        dx in her 40s; BRCA neg  . Prostate cancer Paternal Uncle   . Prostate cancer Paternal Grandfather   . Ovarian cancer Sister 27       found during her pregnancy  . Prostate cancer Paternal Uncle   . Prostate cancer Paternal Uncle   . Cancer Other   . Stroke Other   . Diabetes Other   . Coronary artery disease Other     Social History Social History   Tobacco Use  . Smoking status: Never Smoker  . Smokeless tobacco: Never Used  Substance Use Topics  . Alcohol use: Yes    Comment: socially  . Drug use: No     Allergies   Imitrex [sumatriptan]; Zomig [zolmitriptan]; Latex; and Sulfa antibiotics   Review of Systems Review of Systems  Constitutional: Negative for chills, fatigue and fever.  HENT: Negative for congestion.   Respiratory: Negative for shortness of breath.   Cardiovascular: Negative for chest pain and palpitations.  Gastrointestinal: Positive for rectal pain. Negative for abdominal pain, constipation, diarrhea, nausea and vomiting.  Genitourinary: Negative for difficulty urinating and dysuria.  Musculoskeletal: Negative for arthralgias and gait problem.  Skin: Negative for wound.  Neurological: Negative for headaches.  Psychiatric/Behavioral: Negative for agitation.     Physical Exam Updated Vital Signs BP (!) 137/95 (BP Location: Right Arm)   Pulse 72   Temp 98.3 F (36.8 C) (Oral)   Resp 14   Ht 5' 2" (1.575 m)   Wt 86.6 kg (191 lb)   LMP 08/14/2017   SpO2 100%   BMI 34.93 kg/m   Physical Exam  Constitutional: She is oriented to person, place, and time. She appears well-developed and well-nourished.  Patient appears uncomfortable and anxious.   HENT:  Head: Normocephalic and atraumatic.  Mouth/Throat: Oropharynx is clear and moist. No oropharyngeal exudate.  Eyes: Conjunctivae are normal. Pupils are equal, round, and reactive to light. Right eye exhibits no discharge. Left eye exhibits no discharge.  Neck:  Normal range of motion. Neck supple.  Cardiovascular: Normal rate, regular rhythm and intact distal pulses. Exam reveals no friction rub.  No murmur heard. Pulmonary/Chest: Effort normal and breath sounds normal. No stridor. No respiratory distress. She has no wheezes. She has no rales.  Abdominal: Soft. Bowel sounds are normal. There is no tenderness. There is no guarding.  Genitourinary:  Genitourinary Comments: Chaperone present for exam. No gross blood noted on rectal exam, normal tone. External hemorrhoids present with tenderness over the left lateral tissue, no palpable thrombosed hemorrhoid. No fissure.  Musculoskeletal: Normal range of motion.  Lymphadenopathy:    She has no cervical adenopathy.  Neurological: She is alert and oriented to person, place, and time. Coordination normal.    Skin: Skin is warm and dry. Capillary refill takes less than 2 seconds.  Psychiatric: She has a normal mood and affect. Her behavior is normal.     ED Treatments / Results  Labs (all labs ordered are listed, but only abnormal results are displayed) Labs Reviewed - No data to display  EKG  EKG Interpretation None       Radiology No results found.  Procedures Procedures (including critical care time)  Medications Ordered in ED Medications  ibuprofen (ADVIL,MOTRIN) tablet 800 mg (800 mg Oral Refused 08/18/17 2256)     Initial Impression / Assessment and Plan / ED Course  I have reviewed the triage vital signs and the nursing notes.  Pertinent labs & imaging results that were available during my care of the patient were reviewed by me and considered in my medical decision making (see chart for details).    Patient presents with painful external hemorrhoids. She has tenderness over external hemorrhoids, no palpable thrombosed hemorrhoid on exam. Discussed this patient with Dr. Zammit who also saw the patient and agrees that there is no obvious thrombosed hemorrhoid requiring I&D. Will  have her follow up with general surgery for further management and evaluation. Have also prescribed a steroid suppository for symptomatic relief and have counseled patient on sitz baths. Her blood pressure was elevated, suspect that this is related to significant pain. Have counseled her to have this rechecked. Patient agrees and voices understanding. Dr. Zammit  agrees with above plan.   Final Clinical Impressions(s) / ED Diagnoses   Final diagnoses:  External hemorrhoids    ED Discharge Orders        Ordered    hydrocortisone (ANUSOL-HC) 25 MG suppository  2 times daily     08/18/17 2247       Shrosbree, Emily J, PA-C 08/19/17 0035    Zammit, Joseph, MD 08/19/17 1530  

## 2017-08-18 NOTE — Discharge Instructions (Addendum)
Your hemorrhoid is not in a place which can be excised in the emergency department. I have listed the information below to follow up with general surgery.  Please schedule an appointment for further evaluation and potential need for surgical intervention.  I have written you a prescription for a steroid suppository which you can use for symptomatic relief.  Please also take ibuprofen 800 mg every 6 hours for pain.  Please continue sitz baths at least twice a day  Continue to use stool softener.   Your blood pressure was elevated in the ER today, please have this rechecked at your primary care office.  Please return to the emergency department if you develop any new or worsening symptoms.

## 2017-08-26 ENCOUNTER — Encounter: Payer: Self-pay | Admitting: Gastroenterology

## 2017-09-04 ENCOUNTER — Telehealth: Payer: Self-pay

## 2017-09-04 ENCOUNTER — Ambulatory Visit: Payer: 59 | Admitting: Gastroenterology

## 2017-09-04 ENCOUNTER — Encounter: Payer: Self-pay | Admitting: Gastroenterology

## 2017-09-04 VITALS — BP 118/70 | HR 78 | Ht 62.0 in | Wt 189.0 lb

## 2017-09-04 DIAGNOSIS — K602 Anal fissure, unspecified: Secondary | ICD-10-CM | POA: Diagnosis not present

## 2017-09-04 DIAGNOSIS — K6289 Other specified diseases of anus and rectum: Secondary | ICD-10-CM | POA: Diagnosis not present

## 2017-09-04 DIAGNOSIS — K5900 Constipation, unspecified: Secondary | ICD-10-CM | POA: Diagnosis not present

## 2017-09-04 DIAGNOSIS — R109 Unspecified abdominal pain: Secondary | ICD-10-CM | POA: Insufficient documentation

## 2017-09-04 MED ORDER — TRAMADOL HCL 50 MG PO TABS: 50.0000 mg | ORAL_TABLET | Freq: Three times a day (TID) | ORAL | 0 refills | Status: DC | PRN

## 2017-09-04 MED ORDER — NONFORMULARY OR COMPOUNDED ITEM: 3 refills | Status: DC

## 2017-09-04 MED FILL — traMADol HCL 50 MG TABS: 50 | 6 days supply | Qty: 20 | Fill #0

## 2017-09-04 NOTE — Progress Notes (Signed)
Thank you for sending this case to me. I have reviewed the entire note, and the outlined plan seems appropriate.  It would probably help to have her apply the NTG and some lidocaine ointment to a glycerin suppository.  That can be a helpful way to deliver it due to the pain.  Please have nursing/MA call and recommend to her.  Wilfrid Lund, MD

## 2017-09-04 NOTE — Telephone Encounter (Signed)
Left message on patients voicemail stating to purchase glycerin suppositories to help deliver Nitro Gel to help with pain per Dr. Loletha [name] instructions.  I requested that patient call me back to confirm she received message.

## 2017-09-04 NOTE — Patient Instructions (Addendum)
If you are age 38 or older, your body mass index should be between 23-30. Your Body mass index is 34.57 kg/m. If this is out of the aforementioned range listed, please consider follow up with your Primary Care Provider.  If you are age 40 or younger, your body mass index should be between 19-25. Your Body mass index is 34.57 kg/m. If this is out of the aformentioned range listed, please consider follow up with your Primary Care Provider.   We have sent the following medications to your pharmacy for you to pick up at your convenience: [place].  You have been given Rectal Care Instructions.  Follow up with Alonza Bogus, PA-C on  September 18, 2017 at 9:30 am.  Thank you for choosing me and [facility].   Alonza Bogus, PA-C

## 2017-09-04 NOTE — Telephone Encounter (Signed)
-----   Message from Loralie Champagne, PA-C sent at 09/04/2017  4:24 PM EST ----- Please see Dr. Corena Pilgrim recommendation regarding application of the nitro gel on a glycerin suppository.  Please call and make her aware of this option.  Thank you,  Jess  ----- Message ----- From: Doran Stabler, MD Sent: 09/04/2017   3:33 PM To: Loralie Champagne, PA-C    ----- Message ----- From: Loralie Champagne, PA-C Sent: 09/04/2017  12:59 PM To: Doran Stabler, MD

## 2017-09-04 NOTE — Progress Notes (Addendum)
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  She is here today at the request of her PCP, Dr. Reginia Forts, for evaluation regarding rectal pain and bleeding.  She tells me that she had a gastric sleeve surgery for weight loss in May and since that time she has been dealing with some constipation on and off.  Says that she sometimes only has one BM per week but they are usually soft.  For the past 3 months she has been experiencing rectal/anal pain.  It has been worsening in severity.  Is a sharp, burning pain, especially with BM's but then hurts for a long time after BM's, etc.  She was given lidocaine cream by her PCP, but it just burned.  She called Dr. Hassell Done since he performed her surgery and he called in some diltiazem gel for her to use empirically for a fissure.  She says that she has been using it for a couple of weeks with no improvement, but has not been putting any up inside her anus.  Denies fever and chills.   Past Medical History:  Diagnosis Date  . Anemia   . Anxiety   . Arthritis    L knee OA  . Depression    history of depression in Jan 25, 2011 with death of mother  . Hypertension   . Migraine   . Obesity    has had gastric bypass   Past Surgical History:  Procedure Laterality Date  . CESAREAN SECTION     x 1  . CHOLECYSTECTOMY    . KNEE SURGERY Left   . LAPAROSCOPIC GASTRIC SLEEVE RESECTION N/A 02/17/2017   Procedure: LAPAROSCOPIC GASTRIC SLEEVE RESECTION, UPPER ENDO;  Surgeon: Johnathan Hausen, MD;  Location: [facility];  Service: General;  Laterality: N/A;  . LAPAROSCOPIC UNILATERAL SALPINGECTOMY Left 06/10/2014   Procedure: LAPAROSCOPIC Left SALPINGECTOMY, removal of ectopic pregnancy;  Surgeon: Guss Bunde, MD;  Location: [place] [facility];  Service: Gynecology;  Laterality: Left;  . TUBAL LIGATION    . WISDOM TOOTH EXTRACTION      reports that  has  never smoked. she has never used smokeless tobacco. She reports that she drinks alcohol. She reports that she does not use drugs. family history includes Breast cancer in her maternal aunt; Breast cancer (age of onset: 59) in her mother; Cancer in her mother and other; Coronary artery disease in her other; Diabetes in her other; Ovarian cancer (age of onset: 92) in her sister; Prostate cancer in her paternal grandfather, paternal uncle, paternal uncle, and paternal uncle; Prostate cancer (age of onset: 69) in her father; Stroke in her other. Allergies  Allergen Reactions  . Imitrex [Sumatriptan] Shortness Of Breath  . Zomig [Zolmitriptan] Shortness Of Breath  . Latex Hives and Swelling  . Sulfa Antibiotics Other (See Comments)    Unknown. Since childhood.       Outpatient Encounter Medications as of 09/04/2017  Medication Sig  . azithromycin (ZITHROMAX) 250 MG tablet   . Biotin 10000 MCG TABS Take 1 tablet by mouth daily.  Marland Kitchen CALCIUM PO Take 1 tablet 3 (three) times daily by mouth.  . diltiazem 2 % GEL Apply a pea-sized amount to the affected area 3 times daily.  Marland Kitchen docusate sodium (COLACE) 100 MG capsule Take 100 mg by mouth 2 (two) times daily.  . Multiple Vitamins-Minerals (MULTIVITAMIN WITH  MINERALS) tablet Take 1 tablet by mouth daily.  Marland Kitchen oxybutynin (DITROPAN-XL) 5 MG 24 hr tablet Take 1 tablet (5 mg total) by mouth at bedtime.  . topiramate (TOPAMAX) 100 MG tablet Take 2 tablets (200 mg total) by mouth 2 (two) times daily.  . vitamin B-12 1000 MCG tablet Take 1 tablet (1,000 mcg total) by mouth daily.  . [DISCONTINUED] hydrocortisone (ANUSOL-HC) 25 MG suppository   . [DISCONTINUED] hydrocortisone (ANUSOL-HC) 25 MG suppository Place 1 suppository (25 mg total) 2 (two) times daily rectally.  . [DISCONTINUED] Lidocaine-Hydrocortisone Ace 2.8-0.55 % GEL Place 1 application rectally 2 (two) times daily.  . [DISCONTINUED] metoprolol tartrate (LOPRESSOR) 25 MG tablet Take 2 tablets (50 mg  total) by mouth daily. (Patient not taking: Reported on 08/18/2017)   Facility-Administered Encounter Medications as of 09/04/2017  Medication  . cyanocobalamin ((VITAMIN B-12)) injection 1,000 mcg     REVIEW OF SYSTEMS  : All other systems reviewed and negative except where noted in the History of Present Illness.   PHYSICAL EXAM: BP 118/70   Pulse 78   Ht 5\' 2"  (1.575 m)   Wt 189 lb (85.7 kg)   LMP 08/14/2017   BMI 34.57 kg/m  General: Well developed white female in no acute distress Head: Normocephalic and atraumatic Eyes:  Sclerae anicteric, conjunctiva pink. Ears: Normal auditory acuity Lungs: Clear throughout to auscultation; no increased WOB. Heart: Regular rate and rhythm; no M/R/G. Abdomen: Soft, non-distended.  BS present.  Non-tender. Rectal:  External hemorrhoids noted, but inflamed or irritated.  DRE not tolerated well due to pain.  There was a lot of tenderness on the left. Musculoskeletal: Symmetrical with no gross deformities  Skin: No lesions on visible extremities Extremities: No edema  Neurological: Alert oriented x 4, grossly non-focal Psychological:  Alert and cooperative. Normal mood and affect  ASSESSMENT AND PLAN: *Rectal pain and bleeding:  Symptoms and exam very c/w anal fissure although one was noted definitely seen on exam today due to intolerance of thorough exam.  I am going to treat her with nitro gel 0.125% to be used BID for the next 8-10 weeks; needs to get the medication inside to the first knuckle.  Rectal care instructions reviewed.  I am going to give her some tramadol for pain, but advised her to use it sparingly as it can worsen constipation.  I am going to have her return in 2 weeks for an update and potentially try to re-examine.   *Constipation:  Need to keep her BM's soft.  Will start Miralax daily.   CC:  Wardell Honour, MD

## 2017-09-10 NOTE — Telephone Encounter (Signed)
Called patient today to make sure she received my message.  She states she did not receive it but hasn't picked up Ntiro Gel yet.  She will do that today and will purchase the glycerin suppositories.

## 2017-09-14 ENCOUNTER — Ambulatory Visit: Payer: Self-pay | Admitting: Skilled Nursing Facility1

## 2017-09-18 ENCOUNTER — Encounter: Payer: Self-pay | Admitting: Skilled Nursing Facility1

## 2017-09-18 ENCOUNTER — Ambulatory Visit: Payer: Self-pay | Admitting: Gastroenterology

## 2017-09-18 ENCOUNTER — Encounter: Payer: 59 | Attending: Surgery | Admitting: Skilled Nursing Facility1

## 2017-09-18 DIAGNOSIS — Z6841 Body Mass Index (BMI) 40.0 and over, adult: Secondary | ICD-10-CM | POA: Insufficient documentation

## 2017-09-18 DIAGNOSIS — Z713 Dietary counseling and surveillance: Secondary | ICD-10-CM | POA: Diagnosis not present

## 2017-09-18 NOTE — Progress Notes (Signed)
sleeve Surgery  Primary concerns today: Post-operative Bariatric Surgery Nutrition Management.  Pt states she has 1 bowel movement once to twice a week. Pt states she does not eat many cheese sticks anymore. Pt state she wants to try something different. Pt states she wants to add all carbohydrates to her diet: Dietitian educated the pt on this and pt still wanted to d all carbohydrates back in. Pt questioned when she can drink alcohol: Dietitian advised she wait until May but pt states she will drink it before then so Dietitian advised she was in a safe environment with people she trusted and have some food on her stomach first.   Surgery Date: 02/17/2017 Surgery type: Sleeve Gastrectomy  [facility] weight at [facility]: 289 Weight today: 184.6 Weight change: 29.8  TANITA  BODY COMP RESULTS  01/05/2017 03/04/2017 04/14/2017 06/15/2017 09/18/2017   BMI (kg/m^2) 53.7 48.7 43.6 39.2 33.8   Fat Mass (lbs) 154 133.6 115.2 98.4 69.8   Fat Free Mass (lbs) 130 124.2 123.4 116 114.8   Total Body Water (lbs) 97.2 92 90.4 84.2 82.2   24-hr recall: 3-4 cheesesticks a day B (AM): 2 scrambled egg  (14g) or veggie omelet Snk (AM): greek yogurt (15g) or celery with peanut butter powder L (PM): chicken or egg or hamburger patty (21g) sometimes some vegetable Snk (PM): protein bars or greek yogurt D (PM): chicken or egg or hamburger patty (21g) and butternut squash Snk (PM):   Fluid intake: regular coffee with premier protein as creamer, water, sometimes with flavorings, powerade zero: 64 fluid ounces Estimated total protein intake: 100  Medications: 1 blood pressure medication  Supplementation: celebrate capsule multi and calcium   Using straws: no Drinking while eating: no Having you been chewing well: yes  Chewing/swallowing difficulties: no Changes in vision: no Changes to mood/headaches: no Hair loss/Changes to skin/Changes to nails: a little more shedding: pt thinks it is lack of biotin  Any  difficulty focusing or concentrating: no Sweating: no Dizziness/Lightheaded: no Palpitations: no  Carbonated beverages: no N/V/D/C/GAS: bowel movements better now every couple days over 1 week Abdominal Pain: no Dumping syndrome: no  Recent physical activity: treadmill/weights at home: 2-3 days a week for 60-90 minutes   Progress Towards Goal(s):  In progress.   Nutritional Diagnosis:  [place]-3.3 Overweight/obesity related to past poor dietary habits and physical inactivity as evidenced by patient w/ recent sleeve surgery following dietary guidelines for continued weight loss.    Intervention:  Nutrition counseling. Dietitian educated the pt on advancing her diet to include carbohydrates.   Teaching Method Utilized:  Visual Auditory Hands on  Barriers to learning/adherence to lifestyle change: none identified   Demonstrated degree of understanding via:  Teach Back   Monitoring/Evaluation:  Dietary intake, exercise, and body weight.

## 2017-11-05 ENCOUNTER — Other Ambulatory Visit (HOSPITAL_COMMUNITY): Payer: Self-pay | Admitting: Surgery

## 2017-11-05 DIAGNOSIS — Z9884 Bariatric surgery status: Secondary | ICD-10-CM

## 2017-11-06 ENCOUNTER — Ambulatory Visit (HOSPITAL_COMMUNITY)
Admission: RE | Admit: 2017-11-06 | Discharge: 2017-11-06 | Disposition: A | Discharge status: Home / Self Care | Payer: 59 | Source: Ambulatory Visit | Attending: Surgery | Admitting: Surgery

## 2017-11-06 DIAGNOSIS — R140 Abdominal distension (gaseous): Secondary | ICD-10-CM | POA: Diagnosis not present

## 2017-11-06 DIAGNOSIS — Z9884 Bariatric surgery status: Secondary | ICD-10-CM | POA: Insufficient documentation

## 2017-11-11 ENCOUNTER — Other Ambulatory Visit: Payer: Self-pay | Admitting: Surgery

## 2017-11-11 ENCOUNTER — Other Ambulatory Visit (HOSPITAL_COMMUNITY): Payer: Self-pay | Admitting: Surgery

## 2017-11-11 DIAGNOSIS — Z9884 Bariatric surgery status: Secondary | ICD-10-CM

## 2017-11-13 ENCOUNTER — Ambulatory Visit (HOSPITAL_COMMUNITY)
Admission: RE | Admit: 2017-11-13 | Discharge: 2017-11-13 | Disposition: A | Discharge status: Home / Self Care | Payer: 59 | Source: Ambulatory Visit | Attending: Surgery | Admitting: Surgery

## 2017-11-13 DIAGNOSIS — Z9884 Bariatric surgery status: Secondary | ICD-10-CM | POA: Diagnosis not present

## 2017-11-13 DIAGNOSIS — R109 Unspecified abdominal pain: Secondary | ICD-10-CM | POA: Diagnosis not present

## 2017-11-13 MED ORDER — IOPAMIDOL (ISOVUE-300) INJECTION 61%
100.0000 mL | Freq: Once | INTRAVENOUS | Status: AC | PRN
  Administered 2017-11-13: 100 mL via INTRAVENOUS

## 2017-11-13 MED ORDER — IOPAMIDOL (ISOVUE-370) INJECTION 76%
INTRAVENOUS | Status: AC
  Filled 2017-11-13: qty 100

## 2017-11-13 MED ORDER — SODIUM CHLORIDE 0.9 % IJ SOLN
INTRAMUSCULAR | Status: AC
  Filled 2017-11-13: qty 50

## 2017-11-16 MED FILL — PANTOPRAZOLE SOD DR 40 MG T: 40 | 30 days supply | Qty: 30 | Fill #1

## 2017-11-18 ENCOUNTER — Encounter: Payer: Self-pay | Admitting: Family Medicine

## 2017-11-18 DIAGNOSIS — D259 Leiomyoma of uterus, unspecified: Secondary | ICD-10-CM | POA: Diagnosis not present

## 2017-11-18 DIAGNOSIS — R140 Abdominal distension (gaseous): Secondary | ICD-10-CM | POA: Diagnosis not present

## 2017-11-18 DIAGNOSIS — N920 Excessive and frequent menstruation with regular cycle: Secondary | ICD-10-CM | POA: Diagnosis not present

## 2017-11-18 DIAGNOSIS — N800 Endometriosis of uterus: Secondary | ICD-10-CM | POA: Diagnosis not present

## 2017-11-18 DIAGNOSIS — R102 Pelvic and perineal pain: Secondary | ICD-10-CM | POA: Diagnosis not present

## 2017-11-18 DIAGNOSIS — N946 Dysmenorrhea, unspecified: Secondary | ICD-10-CM | POA: Diagnosis not present

## 2017-11-19 ENCOUNTER — Encounter: Payer: Self-pay | Admitting: Physician Assistant

## 2017-11-19 ENCOUNTER — Other Ambulatory Visit: Payer: Self-pay

## 2017-11-19 ENCOUNTER — Ambulatory Visit (INDEPENDENT_AMBULATORY_CARE_PROVIDER_SITE_OTHER): Payer: 59 | Admitting: Physician Assistant

## 2017-11-19 VITALS — BP 132/78 | Resp 18 | Ht 62.4 in | Wt 175.4 lb

## 2017-11-19 DIAGNOSIS — R1084 Generalized abdominal pain: Secondary | ICD-10-CM | POA: Diagnosis not present

## 2017-11-19 DIAGNOSIS — K5900 Constipation, unspecified: Secondary | ICD-10-CM

## 2017-11-19 NOTE — Patient Instructions (Addendum)
I recommend buying over the counter fleet saline enemas and doing those per rectum every day as needed along with miralax and colace. Try to increase fluids where you can. I have placed a referral to GI but if your bowels start to move with this regime and you feel like you do not need to see them you can cancel the appointment. Please return if any symptoms worsen or you develop new concerning symptoms. Thank you for letting me participate in your health and well being.  Constipation, Adult Constipation is when a person:  Poops (has a bowel movement) fewer times in a week than normal.  Has a hard time pooping.  Has poop that is dry, hard, or bigger than normal.  Follow these instructions at home: Eating and drinking   Eat foods that have a lot of fiber, such as: ? Fresh fruits and vegetables. ? Whole grains. ? Beans.  Eat less of foods that are high in fat, low in fiber, or overly processed, such as: ? [place] fries. ? Hamburgers. ? Cookies. ? Candy. ? Soda.  Drink enough fluid to keep your pee (urine) clear or pale yellow. General instructions  Exercise regularly or as told by your doctor.  Go to the restroom when you feel like you need to poop. Do not hold it in.  Take over-the-counter and prescription medicines only as told by your doctor. These include any fiber supplements.  Do pelvic floor retraining exercises, such as: ? Doing deep breathing while relaxing your lower belly (abdomen). ? Relaxing your pelvic floor while pooping.  Watch your condition for any changes.  Keep all follow-up visits as told by your doctor. This is important. Contact a doctor if:  You have pain that gets worse.  You have a fever.  You have not pooped for 4 days.  You throw up (vomit).  You are not hungry.  You lose weight.  You are bleeding from the anus.  You have thin, pencil-like poop (stool). Get help right away if:  You have a fever, and your symptoms suddenly get  worse.  You leak poop or have blood in your poop.  Your belly feels hard or bigger than normal (is bloated).  You have very bad belly pain.  You feel dizzy or you faint. This information is not intended to replace advice given to you by your health care provider. Make sure you discuss any questions you have with your health care provider. Document Released: 03/10/2008 Document Revised: 04/11/2016 Document Reviewed: 03/12/2016 Elsevier Interactive Patient Education  2018 [employer].   IF you received an x-ray today, you will receive an invoice from [facility]. Please contact [facility] at [phone] with questions or concerns regarding your invoice.   IF you received labwork today, you will receive an invoice from [facility]. Please contact [facility] at [phone] with questions or concerns regarding your invoice.   Our billing staff will not be able to assist you with questions regarding bills from these companies.  You will be contacted with the lab results as soon as they are available. The fastest way to get your results is to activate your My Chart account. Instructions are located on the last page of this paperwork. If you have not heard from [place] regarding the results in 2 weeks, please contact this office.

## 2017-11-19 NOTE — Progress Notes (Signed)
Tanya [name]  MRN: [id number] DOB: 02/11/1981  Subjective:  Tanya [name] is a 37 yo female presenting with abdominal issues x months. Started having bloating 1.5 months ago. Thought it was related to bariatric surgery 9 months ago. Went to bariatric surgeon for this a couple of weeks ago and he did CT of abdomen, which showed some edematous appearance of uterus but otherwise normal. Bariatric surgeon recommended she go to her gynecologist. Saw gynecologist yesterday and notes she is aware that pt has uterine fibroids. They performed transvaginal US and blood work. Told pt that she was very constipated and needed to see her PCP for referral to GI. In terms of constipation, takes miralax and colace three times a day. Has BM 1-2 x a week. Last BM was 4-5 days ago. This has been going on since gastric sleeve was placed. Symptoms are generalized abdominal discomfort, especially after eating. Will also have nausea. Has vomited once last week. Denies fever, chills, melena, hematochezia, and hematemesis. In terms of diet, eats protein and vegetables. Cannot eat sugar/fruits due to gastric sleeve diet restrictions. Drinking maybe 8 oz of water a day, supposed to be drinking 64 oz but cannot due to the abdominal bloating sensation. Denies use of opioid medication.   Review of Systems  HENT: Negative for congestion.   Respiratory: Negative for cough and shortness of breath.   Genitourinary: Negative for difficulty urinating, dysuria, frequency, hematuria and vaginal discharge.  Neurological: Negative for dizziness and light-headedness.    Patient Active Problem List   Diagnosis Date Noted  . Rectal pain 09/04/2017  . Anal fissure 09/04/2017  . Constipation 09/04/2017  . S/P laparoscopic sleeve gastrectomy May 2018 02/17/2017  . Iron deficiency anemia due to chronic blood loss 02/01/2017  . SVT (supraventricular tachycardia) ([place])   . Anemia due to vitamin B12 deficiency 12/08/2016  .  Menometrorrhagia 12/05/2016  . Morbid obesity ([place]) 12/05/2016  . Chronic migraine without aura without status migrainosus, not intractable 12/05/2016  . Ruptured left tubal ectopic pregnancy causing hemoperitoneum 06/10/2014    Current Outpatient Medications on File Prior to Visit  Medication Sig Dispense Refill  . Biotin 10000 MCG TABS Take 1 tablet by mouth daily.    Marland Kitchen CALCIUM PO Take 1 tablet 3 (three) times daily by mouth.    . docusate sodium (COLACE) 100 MG capsule Take 100 mg by mouth 2 (two) times daily.    . Multiple Vitamins-Minerals (MULTIVITAMIN WITH MINERALS) tablet Take 1 tablet by mouth daily.    . ondansetron (ZOFRAN) 4 MG tablet Take 4 mg by mouth every 8 (eight) hours as needed for nausea or vomiting.    . pantoprazole (PROTONIX) 40 MG tablet Take 40 mg by mouth daily.    . polyethylene glycol powder (GLYCOLAX/MIRALAX) powder Take 1 Container by mouth 3 (three) times daily as needed.    . topiramate (TOPAMAX) 100 MG tablet Take 2 tablets (200 mg total) by mouth 2 (two) times daily. 360 tablet 1  . vitamin B-12 1000 MCG tablet Take 1 tablet (1,000 mcg total) by mouth daily. 30 tablet 1  . azithromycin (ZITHROMAX) 250 MG tablet   0  . diltiazem 2 % GEL Apply a pea-sized amount to the affected area 3 times daily. (Patient not taking: Reported on 11/19/2017) 30 g 0  . NONFORMULARY OR COMPOUNDED ITEM Nitro Gel 0.125 % twice daily for 8-10 weeks. (Patient not taking: Reported on 11/19/2017) 30 each 3  . oxybutynin (DITROPAN-XL) 5 MG 24 hr tablet Take  1 tablet (5 mg total) by mouth at bedtime. (Patient not taking: Reported on 11/19/2017) 30 tablet 1  . traMADol (ULTRAM) 50 MG tablet Take 1 tablet (50 mg total) by mouth every 8 (eight) hours as needed. (Patient not taking: Reported on 11/19/2017) 20 tablet 0   Current Facility-Administered Medications on File Prior to Visit  Medication Dose Route Frequency Provider Last Rate Last Dose  . cyanocobalamin ((VITAMIN B-12)) injection 1,000  mcg  1,000 mcg Intramuscular Q30 days Wardell Honour, MD   1,000 mcg at 01/02/17 0940    Allergies  Allergen Reactions  . Imitrex [Sumatriptan] Shortness Of Breath  . Zomig [Zolmitriptan] Shortness Of Breath  . Latex Hives and Swelling  . Sulfa Antibiotics Other (See Comments)    Unknown. Since childhood.    Past Surgical History:  Procedure Laterality Date  . CARPAL TUNNEL RELEASE Bilateral   . CESAREAN SECTION     x 1  . CHOLECYSTECTOMY    . KNEE SURGERY Left   . LAPAROSCOPIC GASTRIC SLEEVE RESECTION N/A 02/17/2017   Procedure: LAPAROSCOPIC GASTRIC SLEEVE RESECTION, UPPER ENDO;  Surgeon: Johnathan Hausen, MD;  Location: [facility];  Service: General;  Laterality: N/A;  . LAPAROSCOPIC UNILATERAL SALPINGECTOMY Left 06/10/2014   Procedure: LAPAROSCOPIC Left SALPINGECTOMY, removal of ectopic pregnancy;  Surgeon: Guss Bunde, MD;  Location: [facility];  Service: Gynecology;  Laterality: Left;  . TUBAL LIGATION    . WISDOM TOOTH EXTRACTION         Social History   Socioeconomic History  . Marital status: Married    Spouse name: Jeneen Rinks  . Number of children: 2  . Years of education: Not on file  . Highest education level: Not on file  Social Needs  . Financial resource strain: Not on file  . Food insecurity - worry: Not on file  . Food insecurity - inability: Not on file  . Transportation needs - medical: Not on file  . Transportation needs - non-medical: Not on file  Occupational History  . Occupation: Research scientist (life sciences): [employer]  Tobacco Use  . Smoking status: Never Smoker  . Smokeless tobacco: Never Used  Substance and Sexual Activity  . Alcohol use: Yes    Comment: socially  . Drug use: No  . Sexual activity: Yes    Birth control/protection: None  Other Topics Concern  . Not on file  Social History Narrative   Marital status: married x 6 years; second marriage      Children: 2 children (20, 28); 1 stepgrandaughter; 1 stepdaughter      Lives: with  husband, one son      Employment: Chartered certified accountant since 2017; fifth floor surgical floor; third shift 3 twelve hour shifts      Tobacco: none      Alcohol: none      Drugs; none      Exercise: none    Objective:  BP 132/78 (BP Location: Left Arm, Patient Position: Sitting, Cuff Size: Normal)   Resp 18   Ht 5' 2.4" (1.585 m)   Wt 175 lb 6.4 oz (79.6 kg)   LMP 11/09/2017   BMI 31.67 kg/m   Physical Exam  Constitutional: She is oriented to person, place, and time and well-developed, well-nourished, and in no distress.  HENT:  Head: Normocephalic and atraumatic.  Eyes: Conjunctivae are normal.  Neck: Normal range of motion.  Cardiovascular: Normal rate, regular rhythm and normal heart sounds.  Pulmonary/Chest: Effort normal.  Abdominal: Bowel sounds are normal. There is generalized tenderness ( mild ). There is no rigidity, no guarding, no tenderness at McBurney's point and negative Murphy's sign.  Genitourinary:  Genitourinary Comments: Moderate amount of soft stool noted in rectum during DRE. No impaction noted.   Neurological: She is alert and oriented to person, place, and time. Gait normal.  Skin: Skin is warm and dry.  Psychiatric: Affect normal.  Vitals reviewed.  I personally reviewed impression from DG UGI w/ KUB and CT abdomen pelvis performed on 11/06/17 and 11/13/17.  CMA Vaughan Basta contacted Dr.Haygood's office and left voicemail requesting fax of yesterday's blood work results.   Assessment and Plan :  1. Constipation, unspecified constipation type 2. Generalized abdominal pain Pt overall well appearing, no distress. Vitals stable. Prior imaging impressions reviewed, no concern for bowel obstruction. Waiting on fax from gyn office with lab results.  She is very frustrated with her degree of constipation. DRE reveals moderate stood burden in rectum, no impaction. Recommended adding OTC fleet enemas to current regimen along with increasing fiber and fluids in diet. She has follow up  with PCP in 2 weeks. Will place GI referral right now, but if symptoms resolve with current plan, she can cancel appointment. Given strict return/ED precautions. Pt understands and agrees to treatment plan.  - Ambulatory referral to Gastroenterology  A total of 25 minutes was spent in the room with the patient, greater than 50% of which was in counseling/coordination of care regarding constipation.  Tenna Delaine PA-C  [facility] 11/19/2017 9:34 AM

## 2017-11-20 ENCOUNTER — Encounter: Payer: Self-pay | Admitting: Physician Assistant

## 2017-11-22 ENCOUNTER — Emergency Department (HOSPITAL_COMMUNITY): Payer: 59

## 2017-11-22 ENCOUNTER — Encounter (HOSPITAL_COMMUNITY): Payer: Self-pay | Admitting: Emergency Medicine

## 2017-11-22 ENCOUNTER — Other Ambulatory Visit: Payer: Self-pay

## 2017-11-22 ENCOUNTER — Emergency Department (HOSPITAL_COMMUNITY)
Admission: EM | Admit: 2017-11-22 | Discharge: 2017-11-22 | Disposition: A | Discharge status: Home / Self Care | Payer: 59 | Attending: Emergency Medicine | Admitting: Emergency Medicine

## 2017-11-22 DIAGNOSIS — Z79899 Other long term (current) drug therapy: Secondary | ICD-10-CM | POA: Diagnosis not present

## 2017-11-22 DIAGNOSIS — K5900 Constipation, unspecified: Secondary | ICD-10-CM

## 2017-11-22 DIAGNOSIS — I10 Essential (primary) hypertension: Secondary | ICD-10-CM | POA: Diagnosis not present

## 2017-11-22 DIAGNOSIS — R1032 Left lower quadrant pain: Secondary | ICD-10-CM | POA: Diagnosis present

## 2017-11-22 DIAGNOSIS — R112 Nausea with vomiting, unspecified: Secondary | ICD-10-CM | POA: Insufficient documentation

## 2017-11-22 DIAGNOSIS — Z9884 Bariatric surgery status: Secondary | ICD-10-CM | POA: Insufficient documentation

## 2017-11-22 LAB — URINALYSIS, ROUTINE W REFLEX MICROSCOPIC
BILIRUBIN URINE: NEGATIVE
Glucose, UA: NEGATIVE mg/dL
Hgb urine dipstick: NEGATIVE
Ketones, ur: NEGATIVE mg/dL
LEUKOCYTES UA: NEGATIVE
NITRITE: NEGATIVE
Protein, ur: NEGATIVE mg/dL
SPECIFIC GRAVITY, URINE: 1.027 (ref 1.005–1.030)
pH: 5 (ref 5.0–8.0)

## 2017-11-22 LAB — COMPREHENSIVE METABOLIC PANEL
ALK PHOS: 39 U/L (ref 38–126)
ALT: 20 U/L (ref 14–54)
ANION GAP: 7 (ref 5–15)
AST: 18 U/L (ref 15–41)
Albumin: 4.2 g/dL (ref 3.5–5.0)
BILIRUBIN TOTAL: 0.6 mg/dL (ref 0.3–1.2)
BUN: 17 mg/dL (ref 6–20)
CO2: 26 mmol/L (ref 22–32)
Calcium: 9.4 mg/dL (ref 8.9–10.3)
Chloride: 109 mmol/L (ref 101–111)
Creatinine, Ser: 0.77 mg/dL (ref 0.44–1.00)
GFR calc non Af Amer: >60 mL/min (ref >60)
Glucose, Bld: 99 mg/dL (ref 65–99)
Potassium: 3.8 mmol/L (ref 3.5–5.1)
Sodium: 142 mmol/L (ref 135–145)
TOTAL PROTEIN: 7 g/dL (ref 6.5–8.1)

## 2017-11-22 LAB — CBC
HCT: 40.5 % (ref 36.0–46.0)
HEMOGLOBIN: 13.4 g/dL (ref 12.0–15.0)
MCH: 27.4 pg (ref 26.0–34.0)
MCHC: 33.1 g/dL (ref 30.0–36.0)
MCV: 82.8 fL (ref 78.0–100.0)
Platelets: 358 10*3/uL (ref 150–400)
RBC: 4.89 MIL/uL (ref 3.87–5.11)
RDW: 13.1 % (ref 11.5–15.5)
WBC: 5.7 10*3/uL (ref 4.0–10.5)

## 2017-11-22 LAB — I-STAT BETA HCG BLOOD, ED (MC, WL, AP ONLY)

## 2017-11-22 LAB — LIPASE, BLOOD: Lipase: 38 U/L (ref 11–51)

## 2017-11-22 MED ORDER — ONDANSETRON 4 MG PO TBDP: 4.0000 mg | ORAL_TABLET | Freq: Once | ORAL | Status: DC | PRN

## 2017-11-22 MED ORDER — PEG 3350-KCL-NABCB-NACL-NASULF 236 G PO SOLR: 4000.0000 mL | Freq: Once | ORAL | 0 refills | Status: AC

## 2017-11-22 MED ORDER — ONDANSETRON HCL 4 MG/2ML IJ SOLN
4.0000 mg | Freq: Once | INTRAMUSCULAR | Status: AC
Start: 2017-11-22 — End: 2017-11-22
  Administered 2017-11-22: 4 mg via INTRAVENOUS
  Filled 2017-11-22: qty 2

## 2017-11-22 MED ORDER — SODIUM CHLORIDE 0.9 % IV BOLUS (SEPSIS)
1000.0000 mL | Freq: Once | INTRAVENOUS | Status: AC
  Administered 2017-11-22: 1000 mL via INTRAVENOUS

## 2017-11-22 NOTE — ED Provider Notes (Signed)
[facility] DEPT Provider Note   CSN: [id number] Arrival date & time: 11/22/17  0409     History   Chief Complaint Chief Complaint  Patient presents with  . Constipation  . Abdominal Pain    HPI Tanya [name] is a 37 y.o. female.  The history is provided by the patient and medical records. No language interpreter was used.  Constipation   Associated symptoms include abdominal pain.  Abdominal Pain   Associated symptoms include nausea, vomiting and constipation. Pertinent negatives include diarrhea.    Tanya [name] is a 37 y.o. female  with a PMH of prior bariatric surgery in May 2018 who presents to the Emergency Department complaining of persistent left-sided abdominal pain over the last month and a half.  Associated with bloating sensation and nausea.  She was seen by her surgeon 2 weeks ago who did CT scan.  They saw no acute abdominal abnormalities, but did note fibroids on her uterus.  She was referred to her GYN who did an ultrasound this past Thursday.  She was told her pain was not likely due to GYN issue, and referred to her PCP.  She saw PCP on 2/14 (3 days ago) who felt as if her symptoms were due to constipation.  She does report not having a bowel movement in about a week.  She was told to start taking MiraLAX to perform enemas.  She has been taking MiraLAX 3 times daily and has done 2 enemas now.  She did have 1 or 2 episodes of very small amount of diarrhea, but no true bowel movement.  She then called PCP who recommended taking Senokot.  She reports taking 8-10 of these, still without having a bowel movement.  Yesterday afternoon, she had 2 episodes of emesis.  She has not tried to eat or drink anything since.  Denies fever, chills, back pain, urinary symptoms.  Not taking any narcotic pain medication.  She has tried Tylenol for pain with no improvement.   Past Medical History:  Diagnosis Date  . Anemia   . Anxiety   . Arthritis     L knee OA  . Depression    history of depression in 12/15/10 with death of mother  . Hypertension   . Migraine   . Obesity 02/2017   has had gastric sleeve    Patient Active Problem List   Diagnosis Date Noted  . Rectal pain 09/04/2017  . Anal fissure 09/04/2017  . Constipation 09/04/2017  . S/P laparoscopic sleeve gastrectomy May 2018 02/17/2017  . Iron deficiency anemia due to chronic blood loss 02/01/2017  . SVT (supraventricular tachycardia) ([place])   . Anemia due to vitamin B12 deficiency 12/08/2016  . Menometrorrhagia 12/05/2016  . Morbid obesity ([place]) 12/05/2016  . Chronic migraine without aura without status migrainosus, not intractable 12/05/2016  . Ruptured left tubal ectopic pregnancy causing hemoperitoneum 06/10/2014    Past Surgical History:  Procedure Laterality Date  . CARPAL TUNNEL RELEASE Bilateral   . CESAREAN SECTION     x 1  . CHOLECYSTECTOMY    . KNEE SURGERY Left   . LAPAROSCOPIC GASTRIC SLEEVE RESECTION N/A 02/17/2017   Procedure: LAPAROSCOPIC GASTRIC SLEEVE RESECTION, UPPER ENDO;  Surgeon: Johnathan Hausen, MD;  Location: [facility];  Service: General;  Laterality: N/A;  . LAPAROSCOPIC UNILATERAL SALPINGECTOMY Left 06/10/2014   Procedure: LAPAROSCOPIC Left SALPINGECTOMY, removal of ectopic pregnancy;  Surgeon: Guss Bunde, MD;  Location: [facility];  Service: Gynecology;  Laterality:  Left;  . TUBAL LIGATION    . WISDOM TOOTH EXTRACTION      OB History    Gravida Para Term Preterm AB Living   '3 2 1     2   ' SAB TAB Ectopic Multiple Live Births           2       Home Medications    Prior to Admission medications   Medication Sig Start Date End Date Taking? Authorizing Provider  Biotin 10000 MCG TABS Take 1 tablet by mouth daily.   Yes [provider]  bisacodyl (FLEET) 10 MG/30ML ENEM Place 10 mg rectally daily as needed (constipation).   Yes [provider]  CALCIUM PO Take 1 tablet 3 (three) times daily by mouth.   Yes [provider]  docusate sodium (COLACE) 100 MG capsule Take 100 mg by mouth 3 (three) times daily as needed for mild constipation.    Yes [provider]  Melatonin 5 MG TABS Take 5 mg by mouth at bedtime.   Yes [provider]  Multiple Vitamins-Minerals (BARIATRIC MULTIVITAMINS/IRON) CAPS Take 1 tablet by mouth 2 (two) times daily.   Yes [provider]  Multiple Vitamins-Minerals (MULTIVITAMIN WITH MINERALS) tablet Take 1 tablet by mouth daily.   Yes [provider]  ondansetron (ZOFRAN) 4 MG tablet Take 4 mg by mouth every 8 (eight) hours as needed for nausea or vomiting.   Yes [provider]  pantoprazole (PROTONIX) 40 MG tablet Take 40 mg by mouth daily.   Yes [provider]  polyethylene glycol powder (GLYCOLAX/MIRALAX) powder Take 1 Container by mouth 3 (three) times daily as needed.   Yes [provider]  senna (SENOKOT) 8.6 MG TABS tablet Take 2 tablets by mouth daily as needed for mild constipation.   Yes [provider]  topiramate (TOPAMAX) 100 MG tablet Take 2 tablets (200 mg total) by mouth 2 (two) times daily. 12/20/16  Yes Wardell Honour, MD  diltiazem 2 % GEL Apply a pea-sized amount to the affected area 3 times daily. Patient not taking: Reported on 11/19/2017 08/08/17   Wardell Honour, MD  NONFORMULARY OR COMPOUNDED ITEM Nitro Gel 0.125 % twice daily for 8-10 weeks. Patient not taking: Reported on 11/22/2017 09/04/17   Zehr, Janett Billow D, PA-C  oxybutynin (DITROPAN-XL) 5 MG 24 hr tablet Take 1 tablet (5 mg total) by mouth at bedtime. Patient not taking: Reported on 11/19/2017 08/08/17   Wardell Honour, MD  polyethylene glycol (GOLYTELY) 236 g solution Take 4,000 mLs by mouth once for 1 dose. 11/22/17 11/22/17  Nelma Phagan, Ozella Almond, PA-C  traMADol (ULTRAM) 50 MG tablet Take 1 tablet (50 mg total) by mouth every 8 (eight) hours as needed. Patient not taking: Reported on 11/19/2017 09/04/17   Zehr, Laban Emperor, PA-C    vitamin B-12 1000 MCG tablet Take 1 tablet (1,000 mcg total) by mouth daily. Patient not taking: Reported on 11/22/2017 12/13/16   Barton Dubois, MD    Family History Family History  Problem Relation Age of Onset  . Breast cancer Mother 86  . Cancer Mother   . Prostate cancer Father 73  . Breast cancer Maternal Aunt        dx in her 4s; BRCA neg  . Prostate cancer Paternal Uncle   . Prostate cancer Paternal Grandfather   . Ovarian cancer Sister 32       found during her pregnancy  . Prostate cancer Paternal Uncle   .  Prostate cancer Paternal Uncle   . Cancer Other   . Stroke Other   . Diabetes Other   . Coronary artery disease Other   . Colon cancer Neg Hx   . Esophageal cancer Neg Hx   . Rectal cancer Neg Hx     Social History Social History   Tobacco Use  . Smoking status: Never Smoker  . Smokeless tobacco: Never Used  Substance Use Topics  . Alcohol use: Yes    Comment: socially  . Drug use: No     Allergies   Imitrex [sumatriptan]; Zomig [zolmitriptan]; Latex; and Sulfa antibiotics   Review of Systems Review of Systems  Gastrointestinal: Positive for abdominal pain, constipation, nausea and vomiting. Negative for blood in stool and diarrhea.  All other systems reviewed and are negative.    Physical Exam Updated Vital Signs BP 108/80 (BP Location: Left Arm)   Pulse (!) 59   Temp 97.7 F (36.5 C) (Oral)   Resp 15   Ht '5\' 2"'  (1.575 m)   Wt 77.6 kg (171 lb)   LMP 11/13/2017 Comment: - hcg 11/1717  SpO2 100%   BMI 31.28 kg/m   Physical Exam  Constitutional: She is oriented to person, place, and time. She appears well-developed and well-nourished. No distress.  HENT:  Head: Normocephalic and atraumatic.  Cardiovascular: Normal rate, regular rhythm and normal heart sounds.  No murmur heard. Pulmonary/Chest: Effort normal and breath sounds normal. No respiratory distress.  Abdominal: Soft. Bowel sounds are normal. She exhibits no distension.     Tenderness to palpation along left side of the abdomen as depicted in image.  No rebound or guarding.  Musculoskeletal: She exhibits no edema.  Neurological: She is alert and oriented to person, place, and time.  Skin: Skin is warm and dry.  Nursing note and vitals reviewed.    ED Treatments / Results  Labs (all labs ordered are listed, but only abnormal results are displayed) Labs Reviewed  URINALYSIS, ROUTINE W REFLEX MICROSCOPIC - Abnormal; Notable for the following components:      Result Value   APPearance HAZY (*)    All other components within normal limits  LIPASE, BLOOD  COMPREHENSIVE METABOLIC PANEL  CBC  I-STAT BETA HCG BLOOD, ED ([facility], [facility], [facility] ONLY)    EKG  EKG Interpretation [name]       Radiology Dg Abdomen 1 View  Result Date: 11/22/2017 CLINICAL DATA:  Abdominal pain, vomiting and constipation EXAM: ABDOMEN - 1 VIEW COMPARISON:  11/13/2017 FINDINGS: Normal bowel gas pattern without significant dilatation, obstruction pattern or ileus. Normal stool burden. Remote cholecystectomy clips noted. No acute osseous finding. Benign pelvic vascular calcifications. IMPRESSION: No acute or significant finding by plain radiography. Electronically Signed   By: Jerilynn Mages.  Shick M.D.   On: 11/22/2017 07:54    Procedures Procedures (including critical care time)  Medications Ordered in ED Medications  sodium chloride 0.9 % bolus 1,000 mL (1,000 mLs Intravenous New Bag/Given 11/22/17 0707)  ondansetron (ZOFRAN) injection 4 mg (4 mg Intravenous Given 11/22/17 0707)     Initial Impression / Assessment and Plan / ED Course  I have reviewed the triage vital signs and the nursing notes.  Pertinent labs & imaging results that were available during my care of the patient were reviewed by me and considered in my medical decision making (see chart for details).    DARNISE MONTAG is a 37 y.o. female who presents to ED for constipation, left-sided abdominal pain x 1.5 months which  acutely worsened over the last several days.  On exam, patient is afebrile, hemodynamically stable with nonsurgical abdomen.  She does have tenderness to the left side of the abdomen.  Blood work reviewed and reassuring.  UA with no signs of infection or blood in the urine.  Abdominal plain film with no acute findings. Patient discussed with Dr. Roxanne Mins who recommends stopping senna and starting patient on Golytely. Home care instructions for this discussed. Strict return precautions and follow up care discussed as well. All questions answered.   Final Clinical Impressions(s) / ED Diagnoses   Final diagnoses:  Constipation, unspecified constipation type    ED Discharge Orders        Ordered    polyethylene glycol (GOLYTELY) 236 g solution   Once     11/22/17 0834       Zahriah Roes, Ozella Almond, PA-C 03/00/92 3300    Delora Fuel, MD 76/22/63 2250

## 2017-11-22 NOTE — Discharge Instructions (Signed)
It was my pleasure taking care of you today!   Start taking Golytely today. Take one cup every 15-30 minutes. You can stop taking this if you are having good bowel movements. This medication is to take INSTEAD OF your miralax and senna.  Increase hydration.   Follow up with your primary care provider - call tomorrow to schedule an appointment.   Return to ER for fevers, new or worsening symptoms, any additional concerns.

## 2017-11-22 NOTE — ED Triage Notes (Signed)
Pt reports having abd pain and vomiting along with constipation. Pt reports taking OTC medication for constipation and is still not normal. Pt reports having bariatric surgery 9 months ago. Pt states when she drinks she vomits.

## 2017-11-23 ENCOUNTER — Encounter: Payer: Self-pay | Admitting: Physician Assistant

## 2017-11-25 MED FILL — LINZESS 145 MCG CAPSULE: 145 | 30 days supply | Qty: 30 | Fill #0

## 2017-11-30 ENCOUNTER — Encounter: Payer: Self-pay | Admitting: Physician Assistant

## 2017-12-02 ENCOUNTER — Telehealth: Payer: Self-pay | Admitting: Gastroenterology

## 2017-12-02 ENCOUNTER — Ambulatory Visit: Payer: 59 | Admitting: Gastroenterology

## 2017-12-02 ENCOUNTER — Encounter: Payer: Self-pay | Admitting: Gastroenterology

## 2017-12-02 VITALS — BP 102/60 | HR 74 | Ht 62.0 in | Wt 174.0 lb

## 2017-12-02 DIAGNOSIS — R194 Change in bowel habit: Secondary | ICD-10-CM | POA: Diagnosis not present

## 2017-12-02 DIAGNOSIS — R140 Abdominal distension (gaseous): Secondary | ICD-10-CM

## 2017-12-02 DIAGNOSIS — R109 Unspecified abdominal pain: Secondary | ICD-10-CM

## 2017-12-02 DIAGNOSIS — K5900 Constipation, unspecified: Secondary | ICD-10-CM

## 2017-12-02 MED ORDER — NA SULFATE-K SULFATE-MG SULF 17.5-3.13-1.6 GM/177ML PO SOLN: ORAL | 0 refills | Status: DC

## 2017-12-02 MED FILL — SUPREP BOWEL PREP KIT: 17.5-3.13-1 | 1 days supply | Qty: 354 | Fill #0

## 2017-12-02 NOTE — Progress Notes (Signed)
12/02/2017 IRLENE CRUDUP [id number] 06/20/1981   HISTORY OF PRESENT ILLNESS:  This is a 37 year old female with complaints of change in bowel habits with constipation, diffuse abdominal pain, and bloating.  Constipation has been an issue on and off since May when she had her gastric sleeve (was seen here and treated for an anal fissure in 08/2017, which has resolved), but recently was having abdominal pain and bloating, which she was told was due to severe constipation although she has 2 imaging studies (abdominal x-ray and CT scan) that do not show any abnormal stool burden (or really any other significant findings).  Due to being told this she has been using large amounts of laxatives with little results from what she had expected she should receive.  Says that she even drank a GoLytely bowel prep that they gave her in the ED.  Says that her last good BM was 7 days ago and she continues to be bloated and have abdominal discomfort.    Past Medical History:  Diagnosis Date  . Anemia   . Anxiety   . Arthritis    L knee OA  . Depression    history of depression in 01/19/2011 with death of mother  . Hypertension   . Migraine   . Obesity 02/2017   has had gastric sleeve   Past Surgical History:  Procedure Laterality Date  . CARPAL TUNNEL RELEASE Bilateral   . CESAREAN SECTION     x 1  . CHOLECYSTECTOMY    . KNEE SURGERY Left   . LAPAROSCOPIC GASTRIC SLEEVE RESECTION N/A 02/17/2017   Procedure: LAPAROSCOPIC GASTRIC SLEEVE RESECTION, UPPER ENDO;  Surgeon: Johnathan Hausen, MD;  Location: [facility];  Service: General;  Laterality: N/A;  . LAPAROSCOPIC UNILATERAL SALPINGECTOMY Left 06/10/2014   Procedure: LAPAROSCOPIC Left SALPINGECTOMY, removal of ectopic pregnancy;  Surgeon: Guss Bunde, MD;  Location: [facility];  Service: Gynecology;  Laterality: Left;  . TUBAL LIGATION    . WISDOM TOOTH EXTRACTION      reports that  has never smoked. she has never used smokeless tobacco. She reports that  she drinks alcohol. She reports that she does not use drugs. family history includes Breast cancer in her maternal aunt; Breast cancer (age of onset: 50) in her mother; Cancer in her mother and other; Coronary artery disease in her other; Diabetes in her other; Ovarian cancer (age of onset: 25) in her sister; Prostate cancer in her paternal grandfather, paternal uncle, paternal uncle, and paternal uncle; Prostate cancer (age of onset: 82) in her father; Stroke in her other. Allergies  Allergen Reactions  . Imitrex [Sumatriptan] Shortness Of Breath  . Zomig [Zolmitriptan] Shortness Of Breath  . Latex Hives and Swelling  . Sulfa Antibiotics Other (See Comments)    Unknown. Since childhood.       Outpatient Encounter Medications as of 12/02/2017  Medication Sig  . AMBULATORY NON FORMULARY MEDICATION Medication Name: Suppository Choe exlax  . Biotin 10000 MCG TABS Take 1 tablet by mouth daily.  . bisacodyl (FLEET) 10 MG/30ML ENEM Place 10 mg rectally daily as needed (constipation).  . CALCIUM PO Take 1 tablet 3 (three) times daily by mouth.  . diltiazem 2 % GEL Apply a pea-sized amount to the affected area 3 times daily.  Marland Kitchen docusate sodium (COLACE) 100 MG capsule Take 100 mg by mouth 3 (three) times daily as needed for mild constipation.   Marland Kitchen linaclotide (LINZESS) 145 MCG CAPS capsule Take 145 mcg  by mouth daily before breakfast.  . Melatonin 5 MG TABS Take 5 mg by mouth at bedtime.  . Multiple Vitamins-Minerals (BARIATRIC MULTIVITAMINS/IRON) CAPS Take 1 tablet by mouth 2 (two) times daily.  . NONFORMULARY OR COMPOUNDED ITEM Nitro Gel 0.125 % twice daily for 8-10 weeks.  . ondansetron (ZOFRAN) 4 MG tablet Take 4 mg by mouth every 8 (eight) hours as needed for nausea or vomiting.  . pantoprazole (PROTONIX) 40 MG tablet Take 40 mg by mouth daily.  . polyethylene glycol powder (GLYCOLAX/MIRALAX) powder Take 1 Container by mouth 3 (three) times daily as needed.  . senna (SENOKOT) 8.6 MG TABS tablet  Take 2 tablets by mouth daily as needed for mild constipation.  . topiramate (TOPAMAX) 100 MG tablet Take 2 tablets (200 mg total) by mouth 2 (two) times daily.  . traMADol (ULTRAM) 50 MG tablet Take 1 tablet (50 mg total) by mouth every 8 (eight) hours as needed.  . vitamin B-12 1000 MCG tablet Take 1 tablet (1,000 mcg total) by mouth daily.  . Multiple Vitamins-Minerals (MULTIVITAMIN WITH MINERALS) tablet Take 1 tablet by mouth daily.  Marland Kitchen oxybutynin (DITROPAN-XL) 5 MG 24 hr tablet Take 1 tablet (5 mg total) by mouth at bedtime. (Patient not taking: Reported on 12/02/2017)   Facility-Administered Encounter Medications as of 12/02/2017  Medication  . cyanocobalamin ((VITAMIN B-12)) injection 1,000 mcg     REVIEW OF SYSTEMS  : All other systems reviewed and negative except where noted in the History of Present Illness.   PHYSICAL EXAM: BP 102/60   Pulse 74   Ht 5\' 2"  (1.575 m)   Wt 174 lb (78.9 kg)   LMP 11/13/2017 Comment: - hcg 11/1717  BMI 31.83 kg/m  General: Well developed white female in no acute distress Head: Normocephalic and atraumatic Eyes:  Sclerae anicteric, conjunctiva pink. Ears: Normal auditory acuity Lungs: Clear throughout to auscultation; no increased WOB Heart: Regular rate and rhythm; no M/R/G. Abdomen: Soft, non-distended.  BS present.  Mild diffuse TTP. Rectal:  Will be done at the time of colonoscopy. Musculoskeletal: Symmetrical with no gross deformities  Skin: No lesions on visible extremities Extremities: No edema  Neurological: Alert oriented x 4, grossly non-focal Psychological:  Alert and cooperative. Normal mood and affect  ASSESSMENT AND PLAN: *37 year old female with complaints of change in bowel habits with constipation, diffuse abdominal pain, and bloating:  Constipation has been an issue on and off since May when she had her gastric sleeve, but recently was having abdominal pain and bloating, which she was told was due to severe constipation  although she has 2 imaging studies that do not show any abnormal stool burden.  Due to being told this she has been using large amounts of laxatives with little results from what she had expected she should receive.  For patient concern and to give reassurance we will schedule for colonoscopy.  I do not think that her abdominal bloating and discomfort is from constipation, but possibly dietary.  She will continue only Linzess 145 mcg daily for her bowel regimen and I have asked her to follow a low residue diet in order to try to reduce some bloating.  And she will also begin a daily probiotic.  Samples restora were given to her here today.  **The risks, benefits, and alternatives to colonoscopy were discussed with the patient and she consents to proceed.   CC:  Wardell Honour, MD

## 2017-12-02 NOTE — Patient Instructions (Addendum)
If you are age 39 or older, your body mass index should be between 23-30. Your Body mass index is 31.83 kg/m. If this is out of the aforementioned range listed, please consider follow up with your Primary Care Provider.  If you are age 49 or younger, your body mass index should be between 19-25. Your Body mass index is 31.83 kg/m. If this is out of the aformentioned range listed, please consider follow up with your Primary Care Provider.   You have been scheduled for a colonoscopy. Please follow written instructions given to you at your visit today.  Please pick up your prep supplies at the pharmacy within the next 1-3 days. If you use inhalers (even only as needed), please bring them with you on the day of your procedure. Your physician has requested that you go to www.startemmi.com and enter the access code given to you at your visit today. This web site gives a general overview about your procedure. However, you should still follow specific instructions given to you by our office regarding your preparation for the procedure.  We have sent the following medications to your pharmacy for you to pick up at your convenience: [place] have been given a Low Fiber Diet.  You have been given samples of Restora.  Thank you for choosing me and [facility].   Alonza Bogus, PA-C

## 2017-12-02 NOTE — Telephone Encounter (Signed)
Spoke with the patient. She report issues ongoing with constipation. States they have escalated to the point of pain. Reports she has not had a bowel movement formed or liquid, she is not passing gas and she has had vomiting. Currently she is on Miralax. She has used enemas and Go-lyte recently. She says this has increased her discomfort. Appointment made for evaluation.

## 2017-12-03 ENCOUNTER — Encounter: Payer: Self-pay | Admitting: Gastroenterology

## 2017-12-08 ENCOUNTER — Encounter: Payer: Self-pay | Admitting: Gastroenterology

## 2017-12-08 DIAGNOSIS — R194 Change in bowel habit: Secondary | ICD-10-CM | POA: Insufficient documentation

## 2017-12-08 DIAGNOSIS — R140 Abdominal distension (gaseous): Secondary | ICD-10-CM | POA: Insufficient documentation

## 2017-12-09 NOTE — Progress Notes (Signed)
Thank you for sending this case to me. I have reviewed the entire note, and the outlined plan seems appropriate.  Colonoscopy sounds appropriate. Consider SIBO, though gastric sleeve would not seem to confer the same risk of that as a GBP.  Wilfrid Lund, MD

## 2017-12-10 ENCOUNTER — Ambulatory Visit: Payer: 59 | Admitting: Gastroenterology

## 2017-12-10 ENCOUNTER — Encounter: Payer: Self-pay | Admitting: Gastroenterology

## 2017-12-10 VITALS — BP 130/86 | HR 59 | Temp 98.0°F | Ht 62.0 in | Wt 174.0 lb

## 2017-12-10 NOTE — Progress Notes (Signed)
Pt's states no medical or surgical changes since previsit or office visit. 

## 2017-12-10 NOTE — Progress Notes (Signed)
Patient rescheduled for 930 procedure in the morning. Dr. Loletha Carrow in to speak with patient due to not being cleaned out she is to try again in the morning. Prep given and instructions given while in admitting.

## 2017-12-11 ENCOUNTER — Telehealth: Payer: Self-pay | Admitting: Gastroenterology

## 2017-12-11 ENCOUNTER — Encounter: Payer: Self-pay | Admitting: Gastroenterology

## 2017-12-11 ENCOUNTER — Ambulatory Visit (AMBULATORY_SURGERY_CENTER): Payer: 59 | Admitting: Gastroenterology

## 2017-12-11 ENCOUNTER — Other Ambulatory Visit: Payer: Self-pay

## 2017-12-11 ENCOUNTER — Ambulatory Visit: Payer: 59 | Admitting: Family Medicine

## 2017-12-11 VITALS — BP 104/62 | HR 69 | Temp 98.4°F | Resp 12 | Ht 62.0 in | Wt 174.0 lb

## 2017-12-11 DIAGNOSIS — R1084 Generalized abdominal pain: Secondary | ICD-10-CM | POA: Diagnosis not present

## 2017-12-11 DIAGNOSIS — K5900 Constipation, unspecified: Secondary | ICD-10-CM

## 2017-12-11 MED ORDER — SODIUM CHLORIDE 0.9 % IV SOLN: 500.0000 mL | Freq: Once | INTRAVENOUS | Status: DC

## 2017-12-11 NOTE — Patient Instructions (Signed)
YOU HAD AN ENDOSCOPIC PROCEDURE TODAY AT [facility]:   Refer to the procedure report that was given to you for any specific questions about what was found during the examination.  If the procedure report does not answer your questions, please call your gastroenterologist to clarify.  If you requested that your care partner not be given the details of your procedure findings, then the procedure report has been included in a sealed envelope for you to review at your convenience later.  YOU SHOULD EXPECT: Some feelings of bloating in the abdomen. Passage of more gas than usual.  Walking can help get rid of the air that was put into your GI tract during the procedure and reduce the bloating. If you had a lower endoscopy (such as a colonoscopy or flexible sigmoidoscopy) you may notice spotting of blood in your stool or on the toilet paper. If you underwent a bowel prep for your procedure, you may not have a normal bowel movement for a few days.  Please Note:  You might notice some irritation and congestion in your nose or some drainage.  This is from the oxygen used during your procedure.  There is no need for concern and it should clear up in a day or so.  SYMPTOMS TO REPORT IMMEDIATELY:   Following lower endoscopy (colonoscopy or flexible sigmoidoscopy):  Excessive amounts of blood in the stool  Significant tenderness or worsening of abdominal pains  Swelling of the abdomen that is new, acute  Fever of 100F or higher  For urgent or emergent issues, a gastroenterologist can be reached at any hour by calling [phone].   DIET:  We do recommend a small meal at first, but then you may proceed to your regular diet.  Drink plenty of fluids but you should avoid alcoholic beverages for 24 hours.  ACTIVITY:  You should plan to take it easy for the rest of today and you should NOT DRIVE or use heavy machinery until tomorrow (because of the sedation medicines used during the test).     FOLLOW UP: Our staff will call the number listed on your records the next business day following your procedure to check on you and address any questions or concerns that you may have regarding the information given to you following your procedure. If we do not reach you, we will leave a message.  However, if you are feeling well and you are not experiencing any problems, there is no need to return our call.  We will assume that you have returned to your regular daily activities without incident.  If any biopsies were taken you will be contacted by phone or by letter within the next 1-3 weeks.  Please call us at [phone] if you have not heard about the biopsies in 3 weeks.    SIGNATURES/CONFIDENTIALITY: You and/or your care partner have signed paperwork which will be entered into your electronic medical record.  These signatures attest to the fact that that the information above on your After   Repeat colonoscopy at age 37.  You will be scheduled for anal manometry- Dr. Loletha Carrow will have his office contact you.

## 2017-12-11 NOTE — Progress Notes (Signed)
To Pacu, VSS. Report to [email or web address] 

## 2017-12-11 NOTE — Op Note (Signed)
[facility] Patient Name: Tanya [name] Procedure Date: 12/11/2017 9:52 AM MRN: [id number] Endoscopist: Mallie Mussel L. Loletha Carrow , MD Age: 37 Referring MD:  Date of Birth: September 07, 1981 Gender: Female Account #: [id number] Procedure:                Colonoscopy Indications:              Generalized abdominal pain, Constipation, Bloating Medicines:                Monitored Anesthesia Care Procedure:                Pre-Anesthesia Assessment:                           - Prior to the procedure, a History and Physical                            was performed, and patient medications and                            allergies were reviewed. The patient's tolerance of                            previous anesthesia was also reviewed. The risks                            and benefits of the procedure and the sedation                            options and risks were discussed with the patient.                            All questions were answered, and informed consent                            was obtained. Prior Anticoagulants: The patient has                            taken no previous anticoagulant or antiplatelet                            agents. ASA Grade Assessment: II - A patient with                            mild systemic disease. After reviewing the risks                            and benefits, the patient was deemed in                            satisfactory condition to undergo the procedure.                           After obtaining informed consent, the colonoscope  was passed under direct vision. Throughout the                            procedure, the patient's blood pressure, pulse, and                            oxygen saturations were monitored continuously. The                            Colonoscope was introduced through the anus and                            advanced to the the cecum, identified by                            appendiceal orifice  and ileocecal valve. The                            colonoscopy was performed without difficulty. The                            patient tolerated the procedure well. The quality                            of the bowel preparation was excellent. The                            ileocecal valve, appendiceal orifice, and rectum                            were photographed. The bowel preparation used was                            Miralax, SUPREP and Plenvu (2 DAY PREP ORIGINIALLY                            GIVEN, DID NOT ADEQUATELY PREP PATIENT - WAS GIVEN                            THE PLENVU AND BROUGHT BACK THE NEXT DAY). Scope In: 41:74:08 AM Scope Out: 10:20:54 AM Scope Withdrawal Time: 0 hours 9 minutes 7 seconds  Total Procedure Duration: 0 hours 14 minutes 43 seconds  Findings:                 The perianal and digital rectal examinations were                            normal.                           The colon (entire examined portion) was redundant.                           The exam was otherwise without abnormality on  direct and retroflexion views. Complications:            No immediate complications. Estimated Blood Loss:     Estimated blood loss: none. Impression:               - Redundant colon.                           - The examination was otherwise normal on direct                            and retroflexion views.                           - No specimens collected. Recommendation:           - Patient has a contact number available for                            emergencies. The signs and symptoms of potential                            delayed complications were discussed with the                            patient. Return to normal activities tomorrow.                            Written discharge instructions were provided to the                            patient.                           - Resume previous diet.                           -  Continue present medications.                           - Repeat colonoscopy at age 30 for screening                            purposes.                           - Perform anal manometry at appointment to be                            scheduled. Henry L. Loletha Carrow, MD 12/11/2017 10:25:51 AM This report has been signed electronically.

## 2017-12-11 NOTE — Telephone Encounter (Signed)
Please schedule this patient for an ano-rectal manometry study for constipation.  Then arrange follow up with me 3-4 weeks after to allow time for it to be read.

## 2017-12-14 ENCOUNTER — Telehealth: Payer: Self-pay

## 2017-12-14 ENCOUNTER — Other Ambulatory Visit: Payer: Self-pay

## 2017-12-14 DIAGNOSIS — K5900 Constipation, unspecified: Secondary | ICD-10-CM

## 2017-12-14 NOTE — Telephone Encounter (Signed)
No number was left on flowsheet for follow up call.  Called [phone] cell and unable to leave a message.  A recorded message stated, "don't speak any english".   maw

## 2017-12-14 NOTE — Telephone Encounter (Signed)
Patient scheduled for first available that coordinated with her schedule on 4/8 at 8:30. Follow up in office on 5/2 at 9:45.

## 2017-12-14 NOTE — Telephone Encounter (Signed)
Called [phone] (home) and was not able to leave a message.  The phone rang a number of times and then a recording stated, "enter the remote access code".  Then the phone hung up.  Tried to make a follow up call after colonoscopy 12-11-17.  maw

## 2017-12-23 DIAGNOSIS — Z6831 Body mass index (BMI) 31.0-31.9, adult: Secondary | ICD-10-CM | POA: Diagnosis not present

## 2017-12-23 DIAGNOSIS — Z01419 Encounter for gynecological examination (general) (routine) without abnormal findings: Secondary | ICD-10-CM | POA: Diagnosis not present

## 2017-12-23 DIAGNOSIS — N946 Dysmenorrhea, unspecified: Secondary | ICD-10-CM | POA: Diagnosis not present

## 2017-12-23 DIAGNOSIS — N920 Excessive and frequent menstruation with regular cycle: Secondary | ICD-10-CM | POA: Diagnosis not present

## 2017-12-23 DIAGNOSIS — D259 Leiomyoma of uterus, unspecified: Secondary | ICD-10-CM | POA: Diagnosis not present

## 2017-12-23 DIAGNOSIS — N800 Endometriosis of uterus: Secondary | ICD-10-CM | POA: Diagnosis not present

## 2018-01-11 ENCOUNTER — Ambulatory Visit (HOSPITAL_COMMUNITY)
Admission: RE | Admit: 2018-01-11 | Discharge: 2018-01-11 | Disposition: A | Discharge status: Home / Self Care | Payer: 59 | Source: Ambulatory Visit | Attending: Gastroenterology | Admitting: Gastroenterology

## 2018-01-11 ENCOUNTER — Encounter (HOSPITAL_COMMUNITY)
Admission: RE | Disposition: A | Discharge status: Home / Self Care | Payer: Self-pay | Source: Ambulatory Visit | Attending: Gastroenterology

## 2018-01-11 DIAGNOSIS — K5902 Outlet dysfunction constipation: Secondary | ICD-10-CM

## 2018-01-11 DIAGNOSIS — K5900 Constipation, unspecified: Secondary | ICD-10-CM | POA: Diagnosis not present

## 2018-01-11 HISTORY — PX: ANAL RECTAL MANOMETRY: SHX6358

## 2018-01-11 SURGERY — MANOMETRY, ANORECTAL

## 2018-01-11 NOTE — Progress Notes (Signed)
Anal Manometry done per protocol. Pt tolerated well. Balloon expulsion test done. Pt unable to expel balloon with in 3 minutes. Balloon removed after water removed from tubing. No complications or problems noted. Dr. Silverio Decamp to read

## 2018-01-13 ENCOUNTER — Other Ambulatory Visit: Payer: Self-pay

## 2018-01-13 ENCOUNTER — Ambulatory Visit: Payer: 59 | Admitting: Family Medicine

## 2018-01-13 ENCOUNTER — Encounter (HOSPITAL_COMMUNITY): Payer: Self-pay | Admitting: Gastroenterology

## 2018-01-13 VITALS — BP 100/60 | HR 116 | Temp 98.4°F | Ht 62.0 in | Wt 166.2 lb

## 2018-01-13 DIAGNOSIS — F411 Generalized anxiety disorder: Secondary | ICD-10-CM | POA: Diagnosis not present

## 2018-01-13 DIAGNOSIS — F5104 Psychophysiologic insomnia: Secondary | ICD-10-CM | POA: Diagnosis not present

## 2018-01-13 DIAGNOSIS — Z9884 Bariatric surgery status: Secondary | ICD-10-CM | POA: Diagnosis not present

## 2018-01-13 DIAGNOSIS — L237 Allergic contact dermatitis due to plants, except food: Secondary | ICD-10-CM | POA: Diagnosis not present

## 2018-01-13 MED ORDER — ALPRAZOLAM 0.5 MG PO TABS: 0.5000 mg | ORAL_TABLET | Freq: Every day | ORAL | 2 refills | Status: DC | PRN

## 2018-01-13 MED ORDER — METHYLPREDNISOLONE SODIUM SUCC 125 MG IJ SOLR
80.0000 mg | Freq: Once | INTRAMUSCULAR | Status: AC
  Administered 2018-01-13: 80 mg via INTRAMUSCULAR

## 2018-01-13 MED ORDER — FLUOXETINE HCL 20 MG PO TABS: 20.0000 mg | ORAL_TABLET | Freq: Every day | ORAL | 3 refills | Status: DC

## 2018-01-13 MED FILL — ALPRAZolam 0.5 MG TABS: 0.5 | 30 days supply | Qty: 30 | Fill #0

## 2018-01-13 MED FILL — FLUoxetine HCL 20 MG TABS: 20 | 30 days supply | Qty: 30 | Fill #0

## 2018-01-13 NOTE — Patient Instructions (Addendum)
EXERCISE/EXERCISE/EXERCISE/EXERCISE.     Poison Ivy Dermatitis Poison ivy dermatitis is inflammation of the skin that is caused by the allergens on the leaves of the poison ivy plant. The skin reaction often involves redness, swelling, blisters, and extreme itching. What are the causes? This condition is caused by a specific chemical (urushiol) found in the sap of the poison ivy plant. This chemical is sticky and can be easily spread to people, animals, and objects. You can get poison ivy dermatitis by:  Having direct contact with a poison ivy plant.  Touching animals, other people, or objects that have come in contact with poison ivy and have the chemical on them.  What increases the risk? This condition is more likely to develop in:  People who are outdoors often.  People who go outdoors without wearing protective clothing, such as closed shoes, long pants, and a long-sleeved shirt.  What are the signs or symptoms? Symptoms of this condition include:  Redness and itching.  A rash that often includes bumps and blisters. The rash usually appears 48 hours after exposure.  Swelling. This may occur if the reaction is more severe.  Symptoms usually last for 1-2 weeks. However, the first time you develop this condition, symptoms may last 3-4 weeks. How is this diagnosed? This condition may be diagnosed based on your symptoms and a physical exam. Your health care provider may also ask you about any recent outdoor activity. How is this treated? Treatment for this condition will vary depending on how severe it is. Treatment may include:  Hydrocortisone creams or calamine lotions to relieve itching.  Oatmeal baths to soothe the skin.  Over-the-counter antihistamine tablets.  Oral steroid medicine for more severe outbreaks.  Follow these instructions at home:  Take or apply over-the-counter and prescription medicines only as told by your health care provider.  Wash exposed skin  as soon as possible with soap and cold water.  Use hydrocortisone creams or calamine lotion as needed to soothe the skin and relieve itching.  Take oatmeal baths as needed. Use colloidal oatmeal. You can get this at your local pharmacy or grocery store. Follow the instructions on the packaging.  Do not scratch or rub your skin.  While you have the rash, wash clothes right after you wear them. How is this prevented?  Learn to identify the poison ivy plant and avoid contact with the plant. This plant can be recognized by the number of leaves. Generally, poison ivy has three leaves with flowering branches on a single stem. The leaves are typically glossy, and they have jagged edges that come to a point at the front.  If you have been exposed to poison ivy, thoroughly wash with soap and water right away. You have about 30 minutes to remove the plant resin before it will cause the rash. Be sure to wash under your fingernails because any plant resin there will continue to spread the rash.  When hiking or camping, wear clothes that will help you to avoid exposure on the skin. This includes long pants, a long-sleeved shirt, tall socks, and hiking boots. You can also apply preventive lotion to your skin to help limit exposure.  If you suspect that your clothes or outdoor gear came in contact with poison ivy, rinse them off outside with a garden hose before you bring them inside your house. Contact a health care provider if:  You have open sores in the rash area.  You have more redness, swelling, or pain in the  affected area.  You have redness that spreads beyond the rash area.  You have fluid, blood, or pus coming from the affected area.  You have a fever.  You have a rash over a large area of your body.  You have a rash on your eyes, mouth, or genitals.  Your rash does not improve after a few days. Get help right away if:  Your face swells or your eyes swell shut.  You have trouble  breathing.  You have trouble swallowing. This information is not intended to replace advice given to you by your health care provider. Make sure you discuss any questions you have with your health care provider. Document Released: 09/19/2000 Document Revised: 02/28/2016 Document Reviewed: 02/28/2015 Elsevier Interactive Patient Education  2018 [employer].    IF you received an x-ray today, you will receive an invoice from [facility]. Please contact [facility] at [phone] with questions or concerns regarding your invoice.   IF you received labwork today, you will receive an invoice from [facility]. Please contact [facility] at [phone] with questions or concerns regarding your invoice.   Our billing staff will not be able to assist you with questions regarding bills from these companies.  You will be contacted with the lab results as soon as they are available. The fastest way to get your results is to activate your My Chart account. Instructions are located on the last page of this paperwork. If you have not heard from [place] regarding the results in 2 weeks, please contact this office.

## 2018-01-13 NOTE — Progress Notes (Signed)
Subjective:    Patient ID: Tanya [name], female    DOB: 01/08/1981, 37 y.o.   MRN: [id number]  01/13/2018  Chronic Conditons (6 month F/u  (also may have posien ivy on right hand the worst)) and Stress (request medication. )    HPI This 37 y.o. female presents for six month follow-up of hypertension, stress reaction, obesity.  Poison ivy: has it on legs, chest, back, head.  Husband works on trees; he has it currently.  Requesting Prednisone shot.  Anxiety: lots of nerves; stress with children; took Alprazolam in past when dealing with mother.  Son in [employer] school; left in January 4682; age 53 years old; doing better but hard with him not at home.  Problems with stomach; had to have colonoscopy; had to undergo another test.  Must follow-up with Dr. Loletha Carrow.  Now planning hysterectomy.  Weight loss is good.  Everything is hitting patient.  Previously prescribed Cymbalta and Dr. Hassell Done advised that would cause weight gain.  Husband says patient is snappy; chronic.  Effexor XR 69m daily provided. Viibryd 464m; does not recall taking. Cymbalta 3042mor one month without benefit. Constant worry; afraid husband will get hurt at work. Cannot sleep. Hands sweating and feet sweating. Lost 120 pounds; Feb 17, 2018 will be one year. Follow-up with general surgeon next month.   GAD 7 : Generalized Anxiety Score 01/13/2018  Nervous, Anxious, on Edge 1  Control/stop worrying 3  Worry too much - different things 3  Trouble relaxing 3  Restless 3  Easily annoyed or irritable 3  Afraid - awful might happen 3  Total GAD 7 Score 19    BP Readings from Last 3 Encounters:  01/13/18 100/60  12/11/17 104/62  12/10/17 130/86   Wt Readings from Last 3 Encounters:  01/13/18 166 lb 3.2 oz (75.4 kg)  12/11/17 174 lb (78.9 kg)  12/10/17 174 lb (78.9 kg)   Immunization History  Administered Date(s) Administered  . Influenza,inj,Quad PF,6+ Mos 07/15/2017  . Influenza-Unspecified 07/06/2016      Review of Systems  Constitutional: Negative for chills, diaphoresis, fatigue and fever.  Eyes: Negative for visual disturbance.  Respiratory: Negative for cough and shortness of breath.   Cardiovascular: Negative for chest pain, palpitations and leg swelling.  Gastrointestinal: Negative for abdominal pain, constipation, diarrhea, nausea and vomiting.  Endocrine: Negative for cold intolerance, heat intolerance, polydipsia, polyphagia and polyuria.  Skin: Positive for rash. Negative for color change, pallor and wound.  Neurological: Negative for dizziness, tremors, seizures, syncope, facial asymmetry, speech difficulty, weakness, light-headedness, numbness and headaches.  Psychiatric/Behavioral: Negative for dysphoric mood, self-injury, sleep disturbance and suicidal ideas. The patient is nervous/anxious.     Past Medical History:  Diagnosis Date  . Anemia   . Anxiety   . Arthritis    L knee OA  . Depression    history of depression in 20103-03-2012th death of mother  . Hyperlipidemia   . Hypertension   . Migraine   . Obesity 02/2017   has had gastric sleeve   Past Surgical History:  Procedure Laterality Date  . ANAL RECTAL MANOMETRY N/A 01/11/2018   Procedure: ANO RECTAL MANOMETRY;  Surgeon: NanMauri PoleD;  Location: [facility];  Service: Endoscopy;  Laterality: N/A;  . CARPAL TUNNEL RELEASE Bilateral   . CESAREAN SECTION     x 1  . CHOLECYSTECTOMY    . KNEE SURGERY Left   . LAPAROSCOPIC GASTRIC SLEEVE RESECTION N/A 02/17/2017   Procedure: LAPAROSCOPIC  GASTRIC SLEEVE RESECTION, UPPER ENDO;  Surgeon: Johnathan Hausen, MD;  Location: [facility];  Service: General;  Laterality: N/A;  . LAPAROSCOPIC UNILATERAL SALPINGECTOMY Left 06/10/2014   Procedure: LAPAROSCOPIC Left SALPINGECTOMY, removal of ectopic pregnancy;  Surgeon: Guss Bunde, MD;  Location: [facility];  Service: Gynecology;  Laterality: Left;  . TUBAL LIGATION    . WISDOM TOOTH EXTRACTION     Allergies  Allergen  Reactions  . Imitrex [Sumatriptan] Shortness Of Breath  . Zomig [Zolmitriptan] Shortness Of Breath  . Latex Hives and Swelling  . Sulfa Antibiotics Other (See Comments)    Unknown. Since childhood.    Current Outpatient Medications on File Prior to Visit  Medication Sig Dispense Refill  . Biotin 10000 MCG TABS Take 1 tablet by mouth daily.    Marland Kitchen CALCIUM PO Take 1 tablet 3 (three) times daily by mouth.    . diltiazem 2 % GEL Apply a pea-sized amount to the affected area 3 times daily. 30 g 0  . docusate sodium (COLACE) 100 MG capsule Take 100 mg by mouth 3 (three) times daily as needed for mild constipation.     . Melatonin 5 MG TABS Take 5 mg by mouth at bedtime.    . Multiple Vitamins-Minerals (BARIATRIC MULTIVITAMINS/IRON) CAPS Take 1 tablet by mouth 2 (two) times daily.    . NONFORMULARY OR COMPOUNDED ITEM Nitro Gel 0.125 % twice daily for 8-10 weeks. 30 each 3  . polyethylene glycol powder (GLYCOLAX/MIRALAX) powder Take 1 Container by mouth 3 (three) times daily as needed.    . topiramate (TOPAMAX) 100 MG tablet Take 2 tablets (200 mg total) by mouth 2 (two) times daily. 360 tablet 1  . vitamin B-12 1000 MCG tablet Take 1 tablet (1,000 mcg total) by mouth daily. 30 tablet 1   Current Facility-Administered Medications on File Prior to Visit  Medication Dose Route Frequency Provider Last Rate Last Dose  . 0.9 %  sodium chloride infusion  500 mL Intravenous Once [name], MD      . cyanocobalamin ((VITAMIN B-12)) injection 1,000 mcg  1,000 mcg Intramuscular Q30 days Wardell Honour, MD   1,000 mcg at 01/02/17 0940   Social History   Socioeconomic History  . Marital status: Married    Spouse name: Jeneen Rinks  . Number of children: 2  . Years of education: Not on file  . Highest education level: Not on file  Occupational History  . Occupation: Research scientist (life sciences): [employer]  . Financial resource strain: Not on file  . Food insecurity:     Worry: Not on file    Inability: Not on file  . Transportation needs:    Medical: Not on file    Non-medical: Not on file  Tobacco Use  . Smoking status: Never Smoker  . Smokeless tobacco: Never Used  Substance and Sexual Activity  . Alcohol use: Yes    Comment: socially  . Drug use: No  . Sexual activity: Yes    Birth control/protection: None  Lifestyle  . Physical activity:    Days per week: Not on file    Minutes per session: Not on file  . Stress: Not on file  Relationships  . Social connections:    Talks on phone: Not on file    Gets together: Not on file    Attends religious service: Not on file    Active member of club or organization: Not on file  Attends meetings of clubs or organizations: Not on file    Relationship status: Not on file  . Intimate partner violence:    Fear of current or ex partner: Not on file    Emotionally abused: Not on file    Physically abused: Not on file    Forced sexual activity: Not on file  Other Topics Concern  . Not on file  Social History Narrative   Marital status: married x 6 years; second marriage      Children: 2 children (20, 26); 1 stepgrandaughter; 1 stepdaughter      Lives: with husband, one son      Employment: Chartered certified accountant since 2017; fifth floor surgical floor; third shift 3 twelve hour shifts      Tobacco: none      Alcohol: none      Drugs; none      Exercise: none   Family History  Problem Relation Age of Onset  . Breast cancer Mother 95  . Cancer Mother   . Prostate cancer Father 67  . Colon polyps Father   . Breast cancer Maternal Aunt        dx in her 35s; BRCA neg  . Prostate cancer Paternal Uncle   . Diabetes Paternal Uncle   . Heart disease Maternal Grandmother   . Diabetes Paternal Grandmother   . Prostate cancer Paternal Grandfather   . Ovarian cancer Sister 65       found during her pregnancy  . Prostate cancer Paternal Uncle   . Prostate cancer Paternal Uncle   . Cancer Other   . Stroke Other    . Diabetes Other   . Coronary artery disease Other   . Colon cancer Neg Hx   . Esophageal cancer Neg Hx   . Rectal cancer Neg Hx        Objective:    BP 100/60 (BP Location: Left Arm, Patient Position: Sitting, Cuff Size: Normal)   Pulse (!) 116   Temp 98.4 F (36.9 C) (Oral)   Ht '5\' 2"'  (1.575 m)   Wt 166 lb 3.2 oz (75.4 kg)   LMP 01/12/2018   SpO2 98%   BMI 30.40 kg/m  Physical Exam  Constitutional: She is oriented to person, place, and time. She appears well-developed and well-nourished. No distress.  HENT:  Head: Normocephalic and atraumatic.  Right Ear: External ear normal.  Left Ear: External ear normal.  Nose: Nose normal.  Mouth/Throat: Oropharynx is clear and moist.  Eyes: Pupils are equal, round, and reactive to light. Conjunctivae and EOM are normal.  Neck: Normal range of motion. Neck supple. Carotid bruit is not present. No thyromegaly present.  Cardiovascular: Normal rate, regular rhythm, normal heart sounds and intact distal pulses. Exam reveals no gallop and no friction rub.  No murmur heard. Pulse 92.  Pulmonary/Chest: Effort normal and breath sounds normal. She has no wheezes. She has no rales.  Lymphadenopathy:    She has no cervical adenopathy.  Neurological: She is alert and oriented to person, place, and time. No cranial nerve deficit. She exhibits normal muscle tone. Coordination normal.  Skin: Skin is warm and dry. Rash noted. Rash is vesicular. She is not diaphoretic. No erythema. No pallor.  Scattered vesicular rash along all extremities and abdomen/torso.  Psychiatric: She has a normal mood and affect. Her behavior is normal. Judgment and thought content normal.   No results found. Depression screen [facility] 2/9 01/13/2018 11/19/2017 08/08/2017 07/15/2017 06/18/2017  Decreased Interest 0 0  0 0 0  Down, Depressed, Hopeless 0 0 0 0 0  PHQ - 2 Score 0 0 0 0 0   Fall Risk  01/13/2018 11/19/2017 08/08/2017 07/15/2017 06/18/2017  Falls in the past year? No No  No No No        Assessment & Plan:   1. Generalized anxiety disorder   2. Psychophysiological insomnia   3. Poison ivy     Anxiety disorder with insomnia: Uncontrolled.  Prescription for Prozac 20 mg daily and Xanax 1 tablet daily as needed for anxiety.  Highly recommend psychotherapy.  Also highly recommend daily exercise for anxiety management.  Poison ivy dermatitis: New onset.  Exposure has been.  Status post Solu-Medrol injection during visit.  Recommend supportive care with topical anti-itch regimens and steroid creams.  Keep wounds clean and covered.  Status post sleeve gastric bypass 11 months ago with excellent weight loss.  No orders of the defined types were placed in this encounter.  Meds ordered this encounter  Medications  . FLUoxetine (PROZAC) 20 MG tablet    Sig: Take 1 tablet (20 mg total) by mouth daily.    Dispense:  30 tablet    Refill:  3  . ALPRAZolam (XANAX) 0.5 MG tablet    Sig: Take 1 tablet (0.5 mg total) by mouth daily as needed for anxiety.    Dispense:  30 tablet    Refill:  2  . methylPREDNISolone sodium succinate (SOLU-MEDROL) 125 mg/2 mL injection 80 mg    No follow-ups on file.   Kristi Elayne Guerin, M.D. Primary Care at [facility] [place] [address] [place], [place]  [address] [phone] phone [phone] fax

## 2018-01-14 ENCOUNTER — Other Ambulatory Visit: Payer: Self-pay | Admitting: Family Medicine

## 2018-01-14 NOTE — Telephone Encounter (Signed)
Rx refill request: Topamax 100 mg  LOV:01/13/18  PCP: [place]: verified

## 2018-01-25 NOTE — Telephone Encounter (Signed)
Patient requesting Rx for topiramate, last office visit 01/13/18. Please advise, thank you.

## 2018-01-27 MED FILL — TOPIRAMATE 100 MG TABS: 100 | 90 days supply | Qty: 360 | Fill #0

## 2018-02-01 ENCOUNTER — Ambulatory Visit: Payer: Self-pay | Admitting: Gastroenterology

## 2018-02-03 ENCOUNTER — Other Ambulatory Visit: Payer: Self-pay | Admitting: Obstetrics and Gynecology

## 2018-02-04 ENCOUNTER — Encounter: Payer: Self-pay | Admitting: Gastroenterology

## 2018-02-04 ENCOUNTER — Ambulatory Visit: Payer: 59 | Admitting: Gastroenterology

## 2018-02-04 VITALS — BP 104/70 | HR 60 | Ht 62.0 in | Wt 170.1 lb

## 2018-02-04 DIAGNOSIS — K5902 Outlet dysfunction constipation: Secondary | ICD-10-CM

## 2018-02-04 DIAGNOSIS — R1030 Lower abdominal pain, unspecified: Secondary | ICD-10-CM | POA: Diagnosis not present

## 2018-02-04 DIAGNOSIS — K581 Irritable bowel syndrome with constipation: Secondary | ICD-10-CM | POA: Diagnosis not present

## 2018-02-04 NOTE — Patient Instructions (Addendum)
If you are age 39 or older, your body mass index should be between 23-30. Your Body mass index is 31.12 kg/m. If this is out of the aforementioned range listed, please consider follow up with your Primary Care Provider.  If you are age 84 or younger, your body mass index should be between 19-25. Your Body mass index is 31.12 kg/m. If this is out of the aformentioned range listed, please consider follow up with your Primary Care Provider.   We have sent a referral to [facility]. They will call you to schedule.   Dr Loletha Carrow recommends that you complete a bowel purge (to clean out your bowels).  Please do the following: once a week. Purchase a bottle of Miralax (238gram bottle) over the counter. Mix the entire bottle in 2L of Gatorade. Drink 1/2 the solution. If no results,drink remanding 1/2.     Thank you for choosing [facility]  Dr Wilfrid Lund III

## 2018-02-04 NOTE — Progress Notes (Signed)
Tanya [name]  Chief Complaint: Chronic constipation and abdominal pain  Subjective  History:  Tanya [name] follows up for her lower abdominal pain, bloating and chronic constipation.  Her symptoms are much the same as before, she remains on MiraLAX and Colace but without much improvement.  She did not find improvement from [place] previously.  Tends to have a bowel movement every several days.  Of [name], she underwent 3 total days of bowel preparation in order to have a sufficient cleanout for colonoscopy recently.  I ordered an anorectal manometry, which she underwent about a month ago.  She is here today to discuss symptoms and report findings.  ROS: Cardiovascular:  no chest pain Respiratory: no dyspnea Intermittent nausea as before Chronic anxiety The patient's Past Medical, Family and Social History were reviewed and are on file in the EMR.  Objective:  Med list reviewed  Current Outpatient Medications:  .  ALPRAZolam (XANAX) 0.5 MG tablet, Take 1 tablet (0.5 mg total) by mouth daily as needed for anxiety., Disp: 30 tablet, Rfl: 2 .  Biotin 10000 MCG TABS, Take 1 tablet by mouth daily., Disp: , Rfl:  .  CALCIUM PO, Take 1 tablet 3 (three) times daily by mouth., Disp: , Rfl:  .  diltiazem 2 % GEL, Apply a pea-sized amount to the affected area 3 times daily., Disp: 30 g, Rfl: 0 .  docusate sodium (COLACE) 100 MG capsule, Take 100 mg by mouth 3 (three) times daily as needed for mild constipation. , Disp: , Rfl:  .  FLUoxetine (PROZAC) 20 MG tablet, Take 1 tablet (20 mg total) by mouth daily., Disp: 30 tablet, Rfl: 3 .  Melatonin 5 MG TABS, Take 5 mg by mouth at bedtime., Disp: , Rfl:  .  Multiple Vitamins-Minerals (BARIATRIC MULTIVITAMINS/IRON) CAPS, Take 1 tablet by mouth 2 (two) times daily., Disp: , Rfl:  .  NONFORMULARY OR COMPOUNDED ITEM, Nitro Gel 0.125 % twice daily for 8-10 weeks., Disp: 30 each, Rfl: 3 .  polyethylene glycol powder (GLYCOLAX/MIRALAX)  powder, Take 1 Container by mouth 3 (three) times daily as needed., Disp: , Rfl:  .  topiramate (TOPAMAX) 100 MG tablet, TAKE 2 TABLETS BY MOUTH TWICE DAILY, Disp: 360 tablet, Rfl: 1 .  vitamin B-12 1000 MCG tablet, Take 1 tablet (1,000 mcg total) by mouth daily., Disp: 30 tablet, Rfl: 1  Current Facility-Administered Medications:  .  0.9 %  sodium chloride infusion, 500 mL, Intravenous, Once, Danis, Estill Cotta III, MD .  cyanocobalamin ((VITAMIN B-12)) injection 1,000 mcg, 1,000 mcg, Intramuscular, Q30 days, Wardell Honour, MD, 1,000 mcg at 01/02/17 0940   Vital signs in last 24 hrs: Vitals:   02/04/18 0945  BP: 104/70  Pulse: 60    Physical Exam  No exam today.  Total 20-minute visit, all of which was spent in discussion.  Recent Labs:  Manometry shows dyssynergic defecation and inability to expel balloon.  She had altered sensation of balloon inflation, only sensing it at 30 cc.  Colonoscopy was unremarkable.    @ASSESSMENTPLANBEGIN @ Assessment: Encounter Diagnoses  Name Primary?  . Constipation due to outlet dysfunction Yes  . Irritable bowel syndrome with constipation   . Lower abdominal pain   Bloating   I believe she has constipation predominant IBS but also clearly has pelvic dyssynergia.  She seems to feel the problem got so much worse after her gastric sleeve surgery last year, but I do not know if that is truly related.  I  explained these manometry findings and motility disorder to [place], and she is agreeable to further therapy.  Plan:  As other therapies have not been helpful, I think she will need to do a MiraLAX/Gatorade bowel purge about every week for some relief of constipation.  We have sent a referral to [facility] with the urologic and pelvic PT clinic for biofeedback.   [name]

## 2018-02-09 ENCOUNTER — Ambulatory Visit: Payer: 59 | Admitting: Physical Therapy

## 2018-02-15 MED FILL — FLUoxetine HCL 20 MG TABS: 20 | 30 days supply | Qty: 30 | Fill #1

## 2018-02-15 MED FILL — ALPRAZolam 0.5 MG TABS: 0.5 | 30 days supply | Qty: 30 | Fill #1

## 2018-02-25 ENCOUNTER — Encounter: Payer: Self-pay | Admitting: Family Medicine

## 2018-03-03 ENCOUNTER — Ambulatory Visit: Payer: 59 | Admitting: Family Medicine

## 2018-03-03 MED FILL — CARAFATE 1 GM/10 ML SUSP: 1 | 30 days supply | Qty: 600 | Fill #0

## 2018-03-05 ENCOUNTER — Telehealth: Payer: Self-pay

## 2018-03-05 NOTE — Telephone Encounter (Signed)
-----   Message from Irene Shipper, MD sent at 03/05/2018 11:09 AM EDT ----- Regarding: RE: request upper endo on sleeve patient with ugi pain with eating ? ulcer Vaughan Basta, Please see below. Dr. Hassell Done needs this patient seen ASAP. She works at the hospital. Patient of Dr. Loletha Carrow. Please get him or been advanced practitioner to see her early next week. Work on to somebody's schedule if necessary. She needs an EGD. Thanks Dr. Henrene Pastor ----- Message ----- From: Johnathan Hausen, MD Sent: 03/05/2018  10:55 AM To: Irene Shipper, MD Subject: request upper endo on sleeve patient with ug#  Jenny Reichmann, Thanks for helping with this patient.  She had a sleeve and has lost >100 lbs.  She has intractable abdominal pain and nausea. I was hoping that she could have EGD. Thanks  [place]

## 2018-03-05 NOTE — Telephone Encounter (Signed)
Pt scheduled to see Tiare Rohlman PA 03/09/18@2 :30pm. Left message for pt regarding appt.

## 2018-03-08 ENCOUNTER — Other Ambulatory Visit: Payer: Self-pay

## 2018-03-08 ENCOUNTER — Encounter: Payer: Self-pay | Admitting: Family Medicine

## 2018-03-08 ENCOUNTER — Ambulatory Visit: Payer: 59 | Admitting: Family Medicine

## 2018-03-08 VITALS — BP 108/70 | HR 106 | Temp 98.5°F | Resp 16 | Ht 63.19 in | Wt 161.0 lb

## 2018-03-08 DIAGNOSIS — R1084 Generalized abdominal pain: Secondary | ICD-10-CM

## 2018-03-08 DIAGNOSIS — N800 Endometriosis of uterus: Secondary | ICD-10-CM | POA: Diagnosis not present

## 2018-03-08 DIAGNOSIS — Z01818 Encounter for other preprocedural examination: Secondary | ICD-10-CM | POA: Diagnosis not present

## 2018-03-08 DIAGNOSIS — N920 Excessive and frequent menstruation with regular cycle: Secondary | ICD-10-CM | POA: Diagnosis not present

## 2018-03-08 DIAGNOSIS — F5104 Psychophysiologic insomnia: Secondary | ICD-10-CM | POA: Diagnosis not present

## 2018-03-08 DIAGNOSIS — F411 Generalized anxiety disorder: Secondary | ICD-10-CM | POA: Diagnosis not present

## 2018-03-08 DIAGNOSIS — R110 Nausea: Secondary | ICD-10-CM

## 2018-03-08 DIAGNOSIS — Z9884 Bariatric surgery status: Secondary | ICD-10-CM | POA: Diagnosis not present

## 2018-03-08 MED ORDER — FLUOXETINE HCL 20 MG PO TABS: 40.0000 mg | ORAL_TABLET | Freq: Every day | ORAL | 1 refills | Status: DC

## 2018-03-08 MED ORDER — ALPRAZOLAM 0.5 MG PO TABS: 0.5000 mg | ORAL_TABLET | Freq: Every day | ORAL | 2 refills | Status: DC | PRN

## 2018-03-08 NOTE — Patient Instructions (Addendum)
   IF you received an x-ray today, you will receive an invoice from [facility]. Please contact [facility] at [phone] with questions or concerns regarding your invoice.   IF you received labwork today, you will receive an invoice from [facility]. Please contact [facility] at [phone] with questions or concerns regarding your invoice.   Our billing staff will not be able to assist you with questions regarding bills from these companies.  You will be contacted with the lab results as soon as they are available. The fastest way to get your results is to activate your My Chart account. Instructions are located on the last page of this paperwork. If you have not heard from us regarding the results in 2 weeks, please contact this office.    Insomnia Insomnia is a sleep disorder that makes it difficult to fall asleep or to stay asleep. Insomnia can cause tiredness (fatigue), low energy, difficulty concentrating, mood swings, and poor performance at work or school. There are three different ways to classify insomnia:  Difficulty falling asleep.  Difficulty staying asleep.  Waking up too early in the morning.  Any type of insomnia can be long-term (chronic) or short-term (acute). Both are common. Short-term insomnia usually lasts for three months or less. Chronic insomnia occurs at least three times a week for longer than three months. What are the causes? Insomnia may be caused by another condition, situation, or substance, such as:  Anxiety.  Certain medicines.  Gastroesophageal reflux disease (GERD) or other gastrointestinal conditions.  Asthma or other breathing conditions.  Restless legs syndrome, sleep apnea, or other sleep disorders.  Chronic pain.  Menopause. This may include hot flashes.  Stroke.  Abuse of alcohol, tobacco, or illegal drugs.  Depression.  Caffeine.  Neurological disorders, such as Alzheimer disease.  An overactive thyroid  (hyperthyroidism).  The cause of insomnia may not be known. What increases the risk? Risk factors for insomnia include:  Gender. Women are more commonly affected than men.  Age. Insomnia is more common as you get older.  Stress. This may involve your professional or personal life.  Income. Insomnia is more common in people with lower income.  Lack of exercise.  Irregular work schedule or night shifts.  Traveling between different time zones.  What are the signs or symptoms? If you have insomnia, trouble falling asleep or trouble staying asleep is the main symptom. This may lead to other symptoms, such as:  Feeling fatigued.  Feeling nervous about going to sleep.  Not feeling rested in the morning.  Having trouble concentrating.  Feeling irritable, anxious, or depressed.  How is this treated? Treatment for insomnia depends on the cause. If your insomnia is caused by an underlying condition, treatment will focus on addressing the condition. Treatment may also include:  Medicines to help you sleep.  Counseling or therapy.  Lifestyle adjustments.  Follow these instructions at home:  Take medicines only as directed by your health care provider.  Keep regular sleeping and waking hours. Avoid naps.  Keep a sleep diary to help you and your health care provider figure out what could be causing your insomnia. Include: ? When you sleep. ? When you wake up during the night. ? How well you sleep. ? How rested you feel the next day. ? Any side effects of medicines you are taking. ? What you eat and drink.  Make your bedroom a comfortable place where it is easy to fall asleep: ? Put up shades or special blackout   curtains to block light from outside. ? Use a white noise machine to block noise. ? Keep the temperature cool.  Exercise regularly as directed by your health care provider. Avoid exercising right before bedtime.  Use relaxation techniques to manage stress. Ask  your health care provider to suggest some techniques that may work well for you. These may include: ? Breathing exercises. ? Routines to release muscle tension. ? Visualizing peaceful scenes.  Cut back on alcohol, caffeinated beverages, and cigarettes, especially close to bedtime. These can disrupt your sleep.  Do not overeat or eat spicy foods right before bedtime. This can lead to digestive discomfort that can make it hard for you to sleep.  Limit screen use before bedtime. This includes: ? Watching TV. ? Using your smartphone, tablet, and computer.  Stick to a routine. This can help you fall asleep faster. Try to do a quiet activity, brush your teeth, and go to bed at the same time each night.  Get out of bed if you are still awake after 15 minutes of trying to sleep. Keep the lights down, but try reading or doing a quiet activity. When you feel sleepy, go back to bed.  Make sure that you drive carefully. Avoid driving if you feel very sleepy.  Keep all follow-up appointments as directed by your health care provider. This is important. Contact a health care provider if:  You are tired throughout the day or have trouble in your daily routine due to sleepiness.  You continue to have sleep problems or your sleep problems get worse. Get help right away if:  You have serious thoughts about hurting yourself or someone else. This information is not intended to replace advice given to you by your health care provider. Make sure you discuss any questions you have with your health care provider. Document Released: 09/19/2000 Document Revised: 02/22/2016 Document Reviewed: 06/23/2014 Elsevier Interactive Patient Education  2018 [employer]  

## 2018-03-08 NOTE — Progress Notes (Signed)
Subjective:    Patient ID: Tanya [name], female    DOB: [birth year], 37 y.o.   MRN: [id number]  03/08/2018  Anxiety (6 week follow-up )    HPI This 37 y.o. female presents for evaluation of anxiety/depression.  Management changes made at last visit include adding Prozac and Xanax for new onset anxiety/depression. Prozac, Xanax. Xanax taking at night for insomnia.  Goes to bed at 8:30; wakes up at 12:00-1:00am.  Six weeks of Prozac; no improvement.  Same pateint.  Still stressed out.   More medical problems; not related to that.  Doing treadmill twice weekly. Stomach problems; when eats and drinks, hurts.  Blood in stool.  Recommending EGD.  Appointment with GI tomorrow.   Lost 4 pounds in three days from not eating. Really nauseated if eats.  Started Carafate for two days; no improvement yet.  Might have an ulcer.   No NSAIDs; no goody powders. Stomach hurts all the time.     BP Readings from Last 3 Encounters:  04/19/18 102/70  03/26/18 106/72  03/22/18 119/76   Wt Readings from Last 3 Encounters:  04/19/18 160 lb (72.6 kg)  03/25/18 169 lb 6.4 oz (76.8 kg)  03/22/18 177 lb (80.3 kg)   Immunization History  Administered Date(s) Administered  . Influenza,inj,Quad PF,6+ Mos 07/15/2017  . Influenza-Unspecified 07/06/2016    Review of Systems  Constitutional: Negative for chills, diaphoresis, fatigue and fever.  Eyes: Negative for visual disturbance.  Respiratory: Negative for cough and shortness of breath.   Cardiovascular: Negative for chest pain, palpitations and leg swelling.  Gastrointestinal: Positive for abdominal pain and nausea. Negative for abdominal distention, anal bleeding, blood in stool, constipation, diarrhea, rectal pain and vomiting.  Endocrine: Negative for cold intolerance, heat intolerance, polydipsia, polyphagia and polyuria.  Neurological: Negative for dizziness, tremors, seizures, syncope, facial asymmetry, speech difficulty, weakness,  light-headedness, numbness and headaches.  Psychiatric/Behavioral: Positive for dysphoric mood and sleep disturbance. Negative for self-injury and suicidal ideas. The patient is nervous/anxious.     Past Medical History:  Diagnosis Date  . Anemia   . Anxiety   . Arthritis    L knee OA  . Depression    history of depression in 12-18-10 with death of mother  . Hyperlipidemia   . Hypertension   . Migraine   . Obesity 02/2017   has had gastric sleeve   Past Surgical History:  Procedure Laterality Date  . ABDOMINAL HYSTERECTOMY    . ANAL RECTAL MANOMETRY N/A 01/11/2018   Procedure: ANO RECTAL MANOMETRY;  Surgeon: Mauri Pole, MD;  Location: [facility];  Service: Endoscopy;  Laterality: N/A;  . CARPAL TUNNEL RELEASE Bilateral   . CESAREAN SECTION     x 1  . CHOLECYSTECTOMY    . KNEE SURGERY Left   . LAPAROSCOPIC GASTRIC SLEEVE RESECTION N/A 02/17/2017   Procedure: LAPAROSCOPIC GASTRIC SLEEVE RESECTION, UPPER ENDO;  Surgeon: Johnathan Hausen, MD;  Location: [facility];  Service: General;  Laterality: N/A;  . LAPAROSCOPIC UNILATERAL SALPINGECTOMY Left 06/10/2014   Procedure: LAPAROSCOPIC Left SALPINGECTOMY, removal of ectopic pregnancy;  Surgeon: Guss Bunde, MD;  Location: [facility];  Service: Gynecology;  Laterality: Left;  . TUBAL LIGATION    . VAGINAL HYSTERECTOMY Bilateral 03/25/2018   Procedure: HYSTERECTOMY VAGINAL, RIGHT SALPINGECTOMY;  Surgeon: Eldred Manges, MD;  Location: [place] [facility];  Service: Gynecology;  Laterality: Bilateral;  ERAS  . WISDOM TOOTH EXTRACTION     Allergies  Allergen Reactions  . Imitrex [Sumatriptan]  Shortness Of Breath  . Zomig [Zolmitriptan] Shortness Of Breath  . Latex Hives and Swelling  . Ibuprofen Other (See Comments)    Can not take because of surgery (No NSAIDS)  . Sulfa Antibiotics Other (See Comments)    Unknown. Since childhood.    Current Outpatient Medications on File Prior to Visit  Medication Sig Dispense Refill  .  Biotin 10000 MCG TABS Take 1 tablet by mouth daily.    Marland Kitchen CALCIUM PO Take 1 tablet 3 (three) times daily by mouth.    . Melatonin 5 MG TABS Take 5 mg by mouth at bedtime.    . Multiple Vitamins-Minerals (BARIATRIC MULTIVITAMINS/IRON) CAPS Take 1 tablet by mouth 2 (two) times daily.    Marland Kitchen topiramate (TOPAMAX) 100 MG tablet TAKE 2 TABLETS BY MOUTH TWICE DAILY 360 tablet 1  . vitamin B-12 1000 MCG tablet Take 1 tablet (1,000 mcg total) by mouth daily. 30 tablet 1  . docusate sodium (COLACE) 100 MG capsule Take 1 capsule (100 mg total) by mouth 2 (two) times daily. 10 capsule 0  . traMADol (ULTRAM) 50 MG tablet Take 2 tablets (100 mg total) by mouth every 6 (six) hours as needed for moderate pain. 30 tablet 2   Current Facility-Administered Medications on File Prior to Visit  Medication Dose Route Frequency Provider Last Rate Last Dose  . cyanocobalamin ((VITAMIN B-12)) injection 1,000 mcg  1,000 mcg Intramuscular Q30 days Wardell Honour, MD   1,000 mcg at 01/02/17 0940   Social History   Socioeconomic History  . Marital status: Married    Spouse name: Jeneen Rinks  . Number of children: 2  . Years of education: Not on file  . Highest education level: Not on file  Occupational History  . Occupation: Research scientist (life sciences): [employer]  . Financial resource strain: Not on file  . Food insecurity:    Worry: Not on file    Inability: Not on file  . Transportation needs:    Medical: Not on file    Non-medical: Not on file  Tobacco Use  . Smoking status: Never Smoker  . Smokeless tobacco: Never Used  Substance and Sexual Activity  . Alcohol use: Yes    Comment: socially  . Drug use: No  . Sexual activity: Yes    Birth control/protection: Surgical    Comment: tubal ligation 2002  Lifestyle  . Physical activity:    Days per week: Not on file    Minutes per session: Not on file  . Stress: Not on file  Relationships  . Social connections:    Talks on phone: Not  on file    Gets together: Not on file    Attends religious service: Not on file    Active member of club or organization: Not on file    Attends meetings of clubs or organizations: Not on file    Relationship status: Not on file  . Intimate partner violence:    Fear of current or ex partner: Not on file    Emotionally abused: Not on file    Physically abused: Not on file    Forced sexual activity: Not on file  Other Topics Concern  . Not on file  Social History Narrative   Marital status: married x 6 years; second marriage      Children: 2 children (71, 56); 1 stepgrandaughter; 1 stepdaughter      Lives: with husband, one son  Employment: Chartered certified accountant since 2017; fifth floor surgical floor; third shift 3 twelve hour shifts      Tobacco: none      Alcohol: none      Drugs; none      Exercise: none   Family History  Problem Relation Age of Onset  . Breast cancer Mother 20  . Cancer Mother   . Prostate cancer Father 52  . Colon polyps Father   . Breast cancer Maternal Aunt        dx in her 33s; BRCA neg  . Prostate cancer Paternal Uncle   . Diabetes Paternal Uncle   . Heart disease Maternal Grandmother   . Diabetes Paternal Grandmother   . Prostate cancer Paternal Grandfather   . Ovarian cancer Sister 18       found during her pregnancy  . Prostate cancer Paternal Uncle   . Prostate cancer Paternal Uncle   . Cancer Other   . Stroke Other   . Diabetes Other   . Coronary artery disease Other   . Colon cancer Neg Hx   . Esophageal cancer Neg Hx   . Rectal cancer Neg Hx        Objective:    BP 108/70   Pulse (!) 106   Temp 98.5 F (36.9 C) (Oral)   Resp 16   Ht 5' 3.19" (1.605 m)   Wt 161 lb (73 kg)   SpO2 99%   BMI 28.35 kg/m  Physical Exam  Constitutional: She is oriented to person, place, and time. She appears well-developed and well-nourished. No distress.  HENT:  Head: Normocephalic and atraumatic.  Right Ear: External ear normal.  Left Ear: External  ear normal.  Nose: Nose normal.  Mouth/Throat: Oropharynx is clear and moist.  Eyes: Pupils are equal, round, and reactive to light. Conjunctivae and EOM are normal.  Neck: Normal range of motion. Neck supple. Carotid bruit is not present. No thyromegaly present.  Cardiovascular: Normal rate, regular rhythm, normal heart sounds and intact distal pulses. Exam reveals no gallop and no friction rub.  No murmur heard. Pulmonary/Chest: Effort normal and breath sounds normal. She has no wheezes. She has no rales.  Abdominal: Soft. Bowel sounds are normal. She exhibits no distension and no mass. There is no tenderness. There is no rebound and no guarding.  Lymphadenopathy:    She has no cervical adenopathy.  Neurological: She is alert and oriented to person, place, and time. She displays normal reflexes. No cranial nerve deficit or sensory deficit. She exhibits normal muscle tone. Coordination normal.  Skin: Skin is warm and dry. No rash noted. She is not diaphoretic. No erythema. No pallor.  Psychiatric: She has a normal mood and affect. Her behavior is normal. Judgment and thought content normal.   No results found. Depression screen [facility] 2/9 04/19/2018 03/08/2018 01/13/2018 11/19/2017 08/08/2017  Decreased Interest 0 0 0 0 0  Down, Depressed, Hopeless 0 0 0 0 0  PHQ - 2 Score 0 0 0 0 0   Fall Risk  04/19/2018 03/08/2018 01/13/2018 11/19/2017 08/08/2017  Falls in the past year? No No No No No        Assessment & Plan:   1. Generalized anxiety disorder   2. Psychophysiological insomnia   3. S/P laparoscopic sleeve gastrectomy May 2018   4. Generalized abdominal pain   5. Nausea without vomiting     Generalized anxiety disorder with insomnia: Slightly improved yet persistent.  Increase fluoxetine to 40 mg daily.  Refill of Xanax provided yet decreased to 0.5 mg tablet.  Encourage psychotherapy; also encourage daily exercise for anxiety management.  Abdominal pain with nausea: s/p laparascopic  sleeve gastrectomy in May 2018; undergoing GI evaluation currently.    No orders of the defined types were placed in this encounter.  Meds ordered this encounter  Medications  . DISCONTD: FLUoxetine (PROZAC) 20 MG tablet    Sig: Take 2 tablets (40 mg total) by mouth daily.    Dispense:  180 tablet    Refill:  1  . DISCONTD: ALPRAZolam (XANAX) 0.5 MG tablet    Sig: Take 1 tablet (0.5 mg total) by mouth daily as needed for anxiety.    Dispense:  30 tablet    Refill:  2    Return in about 6 weeks (around 04/19/2018) for recheck anxiety.   Kayler Rise Elayne Guerin, M.D. Primary Care at [facility] previously [facility] [place] [address] [place], [place]  [address] [phone] phone [phone] fax

## 2018-03-08 NOTE — H&P (Signed)
Tanya [name] is a 37 y.o. female  P: 2-0-2-2 who presents for hysterectomy because of  menorrhagia, adenomyosis and dysmenorrhea.  Over the past 5 years the patient has had a menstrual flow that lasted 7 days with the change of a pad up to 10 times a day.  She reports the passage of clots and cramping that she rates 10/10 on a 10 point pain scale with no relief from OTC analgesia and only minimal relief with heat application.  She declined management options of Depo Provera due to a past history  of prolong bleeding and combination oral contraception due to a fear of  weight gain.  She admits to problems with constipation and occasional dyspareunia but denies any changes in urination, incontinence or vaginitis symptoms.   A pelvic ultrasound in February of this year showed: an anteverted uterus with the appearance of adenomyosis: 7.66 x 6.25 x 6.10 cm, endometrium: 1.07 cm, left anterior intramural fibroid:-2.17 cm; right ovary-3.45 cm and left ovary-2.60 cm [length from fundus to external os-11.1 cm].  Labs done at that same time revealed a normal TSH and  hemoglobin (12.4) though  her ferritin was low at 11.  The patient was given a review of both medical and surgical management option however she has chosen to proceed with definitive therapy in the form of hysterectomy.  Past Medical History  OB History: G: 3;  P: 2-0-1-2; SVB: 1997 (9lbs. 4oz.) and C-section 2002  GYN History: menarche: 37 YO    LMP: 03/06/2018    Contraception: Tubal Sterilization.   Denies history of abnormal PAP smear or STDs.   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 Imitrex [Sumatriptan] Shortness Of Breath  . Zomig [Zolmitriptan] Shortness Of Breath  . Latex Hives and Swelling  . Sulfa Antibiotics Other (See Comments)    Unknown. Since childhood.     Denies sensitivity to peanuts, shellfish, soy,  or adhesives.  ROS: Admits to glasses, constipation, stomach pains, nausea,  incontinence and  left knee pain but  denies headache, vision changes, nasal congestion, dysphagia, tinnitus, dizziness, hoarseness, cough,  chest pain, shortness of breath, vomiting, diarrhea,  urinary frequency, urgency  dysuria, hematuria, vaginitis symptoms, pelvic pain, swelling of joints,easy bruising,  myalgias,  skin rashes, unexplained weight loss and except as is mentioned in the history of present illness, patient's review of systems is otherwise negative.    Physical Exam  Bp: 92/68   P: 62 bpm  R: 16  Temperature: 97.8 degrees F orally   Weight: 161 lbs.  Height: 5'2" BMI: 29.4  Neck: supple without masses or thyromegaly Lungs: clear to auscultation Heart: regular rate and rhythm Abdomen: soft, diffuse lower quadrant tenderness with voluntary guarding but no rebound and no organomegaly Pelvic:EGBUS- wnl; vagina-tender with normal rugae and  large  blood;  uterus-normal size and  tender, cervix without lesions or motion tenderness; adnexae-bilateral  tenderness or masses Extremities:  no clubbing, cyanosis or edema  Endometrial Biopsy-menstrual phase endometrial tissue; no hyperplasia, malignancy or atypia    Assesment: Menorrhagia                        Dysmenorrhea                        Adenomyosis   Disposition:  A discussion was held with patient regarding the indication for her procedure(s) along with the risks, which include but are not limited to: reaction to anesthesia, damage to adjacent organs, infection and excessive bleeding. The patient verbalized understanding of these risks and has consented to proceed with a Total Vaginal Hysterectomy with Bilateral Salpingectomy at [facility] on March 25, 2018.   CSN# [id number]   Sabina Beavers J. Florene Glen, PA-C  for Dr. Seymour Bars. Haygood

## 2018-03-09 ENCOUNTER — Ambulatory Visit: Payer: 59 | Admitting: Gastroenterology

## 2018-03-09 ENCOUNTER — Encounter

## 2018-03-09 ENCOUNTER — Encounter: Payer: Self-pay | Admitting: Gastroenterology

## 2018-03-09 VITALS — BP 132/80 | HR 60 | Ht 62.0 in | Wt 165.2 lb

## 2018-03-09 DIAGNOSIS — R110 Nausea: Secondary | ICD-10-CM | POA: Diagnosis not present

## 2018-03-09 DIAGNOSIS — R1012 Left upper quadrant pain: Secondary | ICD-10-CM | POA: Diagnosis not present

## 2018-03-09 MED ORDER — PROMETHAZINE HCL 12.5 MG PO TABS: 12.5000 mg | ORAL_TABLET | Freq: Four times a day (QID) | ORAL | 0 refills | Status: DC | PRN

## 2018-03-09 MED ORDER — DICYCLOMINE HCL 20 MG PO TABS: 20.0000 mg | ORAL_TABLET | Freq: Four times a day (QID) | ORAL | 2 refills | Status: DC

## 2018-03-09 MED FILL — DICYCLOMINE 20 MG TABLET: 20 | 15 days supply | Qty: 60 | Fill #0

## 2018-03-09 MED FILL — PROMETHAZINE 12.5 MG TABLET: 12.5 | 8 days supply | Qty: 30 | Fill #0

## 2018-03-09 MED FILL — FLUoxetine HCL 20 MG TABS: 20 | 90 days supply | Qty: 180 | Fill #0

## 2018-03-09 NOTE — Patient Instructions (Signed)
We have sent the following medications to your pharmacy for you to pick up at your convenience:  Phenergan every 8 hours as needed for nausea.  Bentyl 20 mg twice a day  You have been scheduled for an endoscopy. Please follow written instructions given to you at your visit today. If you use inhalers (even only as needed), please bring them with you on the day of your procedure. Your physician has requested that you go to www.startemmi.com and enter the access code given to you at your visit today. This web site gives a general overview about your procedure. However, you should still follow specific instructions given to you by our office regarding your preparation for the procedure.

## 2018-03-09 NOTE — Progress Notes (Signed)
03/09/2018 Tanya [name] [id number] [birth year]   HISTORY OF PRESENT ILLNESS:  This is a 37 year old female who is a patient of Dr. Corena Pilgrim who usually follows her for constipation issues.  She is s/p gastric sleeve in 02/2017.  She is here today at the request of Dr. Hassell Done, who did her surgery, to discuss EGD for complaints of nausea and abdominal pain.  She tells me that for the past month she has been miserable.  She has terrible nausea and LUQ/left-sided abdominal pain.  The symptoms are constant but much worse after she eats or drinks anything (much worse after eating but hurts after drinking as well).  She is on pantoprazole 40 mg daily and was recently given carafate suspension as well, but says that so far she has not noticed any difference with it.  Has tried zofran for the nausea but it did not really seem to work.  She had an UGI and CT scan abdomen and pelvis with contrast earlier this year, however, and those studies were unremarkable at that time.   Past Medical History:  Diagnosis Date  . Anemia   . Anxiety   . Arthritis    L knee OA  . Depression    history of depression in 2011/01/26 with death of mother  . Hyperlipidemia   . Hypertension   . Migraine   . Obesity 02/2017   has had gastric sleeve   Past Surgical History:  Procedure Laterality Date  . ANAL RECTAL MANOMETRY N/A 01/11/2018   Procedure: ANO RECTAL MANOMETRY;  Surgeon: Mauri Pole, MD;  Location: [facility];  Service: Endoscopy;  Laterality: N/A;  . CARPAL TUNNEL RELEASE Bilateral   . CESAREAN SECTION     x 1  . CHOLECYSTECTOMY    . KNEE SURGERY Left   . LAPAROSCOPIC GASTRIC SLEEVE RESECTION N/A 02/17/2017   Procedure: LAPAROSCOPIC GASTRIC SLEEVE RESECTION, UPPER ENDO;  Surgeon: Johnathan Hausen, MD;  Location: [facility];  Service: General;  Laterality: N/A;  . LAPAROSCOPIC UNILATERAL SALPINGECTOMY Left 06/10/2014   Procedure: LAPAROSCOPIC Left SALPINGECTOMY, removal of ectopic pregnancy;  Surgeon:  Guss Bunde, MD;  Location: [facility];  Service: Gynecology;  Laterality: Left;  . TUBAL LIGATION    . WISDOM TOOTH EXTRACTION      reports that she has never smoked. She has never used smokeless tobacco. She reports that she drinks alcohol. She reports that she does not use drugs. family history includes Breast cancer in her maternal aunt; Breast cancer (age of onset: 39) in her mother; Cancer in her mother and other; Colon polyps in her father; Coronary artery disease in her other; Diabetes in her other, paternal grandmother, and paternal uncle; Heart disease in her maternal grandmother; Ovarian cancer (age of onset: 105) in her sister; Prostate cancer in her paternal grandfather, paternal uncle, paternal uncle, and paternal uncle; Prostate cancer (age of onset: 93) in her father; Stroke in her other. Allergies  Allergen Reactions  . Imitrex [Sumatriptan] Shortness Of Breath  . Zomig [Zolmitriptan] Shortness Of Breath  . Latex Hives and Swelling  . Sulfa Antibiotics Other (See Comments)    Unknown. Since childhood.       Outpatient Encounter Medications as of 03/09/2018  Medication Sig  . ALPRAZolam (XANAX) 0.5 MG tablet Take 1 tablet (0.5 mg total) by mouth daily as needed for anxiety.  . Biotin 10000 MCG TABS Take 1 tablet by mouth daily.  Marland Kitchen CALCIUM PO Take 1 tablet 3 (three) times  daily by mouth.  . diltiazem 2 % GEL Apply a pea-sized amount to the affected area 3 times daily.  Marland Kitchen docusate sodium (COLACE) 100 MG capsule Take 100 mg by mouth 3 (three) times daily as needed for mild constipation.   Marland Kitchen FLUoxetine (PROZAC) 20 MG tablet Take 2 tablets (40 mg total) by mouth daily.  . Melatonin 5 MG TABS Take 5 mg by mouth at bedtime.  . Multiple Vitamins-Minerals (BARIATRIC MULTIVITAMINS/IRON) CAPS Take 1 tablet by mouth 2 (two) times daily.  . NONFORMULARY OR COMPOUNDED ITEM Nitro Gel 0.125 % twice daily for 8-10 weeks.  . pantoprazole sodium (PROTONIX) 40 mg/20 mL PACK Protonix 40 mg  granules delayed-release packet  Take 1 packet every day by oral route.  . polyethylene glycol powder (GLYCOLAX/MIRALAX) powder Take 1 Container by mouth 3 (three) times daily as needed.  . sucralfate (CARAFATE) 1 GM/10ML suspension Carafate 100 mg/mL oral suspension  . topiramate (TOPAMAX) 100 MG tablet TAKE 2 TABLETS BY MOUTH TWICE DAILY  . vitamin B-12 1000 MCG tablet Take 1 tablet (1,000 mcg total) by mouth daily.   Facility-Administered Encounter Medications as of 03/09/2018  Medication  . 0.9 %  sodium chloride infusion  . cyanocobalamin ((VITAMIN B-12)) injection 1,000 mcg     REVIEW OF SYSTEMS  : All other systems reviewed and negative except where noted in the History of Present Illness.   PHYSICAL EXAM: BP 132/80   Pulse 60   Ht 5\' 2"  (1.575 m)   Wt 165 lb 3.2 oz (74.9 kg)   BMI 30.22 kg/m  General: Well developed white female in no acute distress; appears miserable Head: Normocephalic and atraumatic Eyes:  Sclerae anicteric, conjunctiva pink. Ears: Normal auditory acuity Lungs: Clear throughout to auscultation; no increased WOB. Heart: Regular rate and rhythm; no M/R/G. Abdomen: Soft, non-distended.  BS present.  LUQ and left-midabdominal TTP. Musculoskeletal: Symmetrical with no gross deformities  Skin: No lesions on visible extremities Extremities: No edema  Neurological: Alert oriented x 4, grossly non-focal Psychological:  Alert and cooperative. Normal mood and affect  ASSESSMENT AND PLAN: *Nausea and left sided abdominal pain:  Constant, but worse post-prandially for at least the past month  She is s/p gastric sleeve in 02/2017.  Dr. Hassell Done requesting EGD.  Will schedule with Dr. Loletha Carrow.  Will try phenergan to be used while at home since Zofran did not seem to help.  Will try Bentyl 20 mg BID for now to treat as intestinal spasm.    **The risks, benefits, and alternatives to EGD were discussed with the patient and she consents to proceed.   CC:  Johnathan Hausen,  MD

## 2018-03-10 ENCOUNTER — Encounter: Payer: Self-pay | Admitting: Gastroenterology

## 2018-03-11 NOTE — Progress Notes (Signed)
Thank you for sending this case to me. I have reviewed the entire note, and the outlined plan is what we discussed.  Thanks for seeing her while I am on hospital service this week. EGD reasonable and indicated.  If unrevealing, this appears to be a functional disorder exacerbated or unmasked by bariatric surgery.  Wilfrid Lund, MD

## 2018-03-17 ENCOUNTER — Other Ambulatory Visit: Payer: Self-pay

## 2018-03-17 ENCOUNTER — Ambulatory Visit: Payer: Self-pay | Admitting: Gastroenterology

## 2018-03-17 ENCOUNTER — Encounter: Payer: Self-pay | Admitting: Gastroenterology

## 2018-03-17 ENCOUNTER — Ambulatory Visit (AMBULATORY_SURGERY_CENTER): Payer: 59 | Admitting: Gastroenterology

## 2018-03-17 VITALS — BP 139/84 | HR 53 | Temp 98.6°F | Resp 16 | Ht 62.0 in | Wt 165.0 lb

## 2018-03-17 DIAGNOSIS — R112 Nausea with vomiting, unspecified: Secondary | ICD-10-CM | POA: Diagnosis present

## 2018-03-17 DIAGNOSIS — R1012 Left upper quadrant pain: Secondary | ICD-10-CM | POA: Diagnosis not present

## 2018-03-17 DIAGNOSIS — I10 Essential (primary) hypertension: Secondary | ICD-10-CM | POA: Diagnosis not present

## 2018-03-17 DIAGNOSIS — R110 Nausea: Secondary | ICD-10-CM | POA: Diagnosis not present

## 2018-03-17 MED ORDER — SODIUM CHLORIDE 0.9 % IV SOLN: 500.0000 mL | Freq: Once | INTRAVENOUS | Status: DC

## 2018-03-17 MED FILL — ALPRAZolam 0.5 MG TABS: 0.5 | 30 days supply | Qty: 30 | Fill #2

## 2018-03-17 NOTE — Op Note (Signed)
[facility] Patient Name: Tanya [name] Procedure Date: 03/17/2018 10:12 AM MRN: [id number] Endoscopist: Mallie Mussel L. Loletha Carrow , MD Age: 37 Referring MD:  Date of Birth: [birth year] Gender: Female Account #: [id number] Procedure:                Upper GI endoscopy Indications:              Abdominal bloating, Nausea (present since May, 2018                            gastric sleeve surgery) Medicines:                Monitored Anesthesia Care Procedure:                Pre-Anesthesia Assessment:                           - Prior to the procedure, a History and Physical                            was performed, and patient medications and                            allergies were reviewed. The patient's tolerance of                            previous anesthesia was also reviewed. The risks                            and benefits of the procedure and the sedation                            options and risks were discussed with the patient.                            All questions were answered, and informed consent                            was obtained. Prior Anticoagulants: The patient has                            taken no previous anticoagulant or antiplatelet                            agents. ASA Grade Assessment: II - A patient with                            mild systemic disease. After reviewing the risks                            and benefits, the patient was deemed in                            satisfactory condition to undergo the procedure.  After obtaining informed consent, the endoscope was                            passed under direct vision. Throughout the                            procedure, the patient's blood pressure, pulse, and                            oxygen saturations were monitored continuously. The                            Endoscope was introduced through the mouth, and                            advanced to the second part  of duodenum. The upper                            GI endoscopy was accomplished without difficulty.                            The patient tolerated the procedure well. Scope In: Scope Out: Findings:                 The larynx was normal.                           The esophagus was normal.                           Evidence of prior gastric sleeve procedure.                           Bilious fluid was found in the stomach.                           The cardia and gastric fundus were normal on                            retroflexion.                           The examined duodenum was normal. Complications:            No immediate complications. Estimated Blood Loss:     Estimated blood loss: none. Impression:               - Normal larynx.                           - Normal esophagus.                           - Bilious gastric fluid.                           - Normal examined duodenum.                           -  No specimens collected. Recommendation:           - Patient has a contact number available for                            emergencies. The signs and symptoms of potential                            delayed complications were discussed with the                            patient. Return to normal activities tomorrow.                            Written discharge instructions were provided to the                            patient.                           - Resume previous diet.                           - Continue present medications.                           - Consider biofeedback as advised for chronic                            constipation from dyssynergia. Taylor Spilde L. Loletha Carrow, MD 03/17/2018 10:46:04 AM This report has been signed electronically.

## 2018-03-17 NOTE — Progress Notes (Signed)
Report given to PACU, vss 

## 2018-03-17 NOTE — Patient Instructions (Signed)
YOU HAD AN ENDOSCOPIC PROCEDURE TODAY AT THE  ENDOSCOPY CENTER:   Refer to the procedure report that was given to you for any specific questions about what was found during the examination.  If the procedure report does not answer your questions, please call your gastroenterologist to clarify.  If you requested that your care partner not be given the details of your procedure findings, then the procedure report has been included in a sealed envelope for you to review at your convenience later.  YOU SHOULD EXPECT: Some feelings of bloating in the abdomen. Passage of more gas than usual.  Walking can help get rid of the air that was put into your GI tract during the procedure and reduce the bloating. If you had a lower endoscopy (such as a colonoscopy or flexible sigmoidoscopy) you may notice spotting of blood in your stool or on the toilet paper. If you underwent a bowel prep for your procedure, you may not have a normal bowel movement for a few days.  Please Note:  You might notice some irritation and congestion in your nose or some drainage.  This is from the oxygen used during your procedure.  There is no need for concern and it should clear up in a day or so.  SYMPTOMS TO REPORT IMMEDIATELY:    Following upper endoscopy (EGD)  Vomiting of blood or coffee ground material  New chest pain or pain under the shoulder blades  Painful or persistently difficult swallowing  New shortness of breath  Fever of 100F or higher  Black, tarry-looking stools  For urgent or emergent issues, a gastroenterologist can be reached at any hour by calling [phone].    DIET:  We do recommend a small meal at first, but then you may proceed to your regular diet.  Drink plenty of fluids but you should avoid alcoholic beverages for 24 hours.  ACTIVITY:  You should plan to take it easy for the rest of today and you should NOT DRIVE or use heavy machinery until tomorrow (because of the sedation medicines  used during the test).    FOLLOW UP: Our staff will call the number listed on your records the next business day following your procedure to check on you and address any questions or concerns that you may have regarding the information given to you following your procedure. If we do not reach you, we will leave a message.  However, if you are feeling well and you are not experiencing any problems, there is no need to return our call.  We will assume that you have returned to your regular daily activities without incident.  If any biopsies were taken you will be contacted by phone or by letter within the next 1-3 weeks.  Please call us at [phone] if you have not heard about the biopsies in 3 weeks.    SIGNATURES/CONFIDENTIALITY: You and/or your care partner have signed paperwork which will be entered into your electronic medical record.  These signatures attest to the fact that that the information above on your After Visit Summary has been reviewed and is understood.  Full responsibility of the confidentiality of this discharge information lies with you and/or your care-partner.  Thank you for letting us take care of your healthcare needs today. 

## 2018-03-18 ENCOUNTER — Telehealth: Payer: Self-pay | Admitting: *Deleted

## 2018-03-18 NOTE — Telephone Encounter (Signed)

## 2018-03-19 ENCOUNTER — Other Ambulatory Visit: Payer: Self-pay

## 2018-03-19 ENCOUNTER — Ambulatory Visit: Payer: Self-pay

## 2018-03-19 ENCOUNTER — Emergency Department (HOSPITAL_COMMUNITY): Payer: 59

## 2018-03-19 ENCOUNTER — Encounter (HOSPITAL_COMMUNITY): Payer: Self-pay

## 2018-03-19 ENCOUNTER — Emergency Department (HOSPITAL_COMMUNITY)
Admission: EM | Admit: 2018-03-19 | Discharge: 2018-03-19 | Disposition: A | Discharge status: Home / Self Care | Payer: 59 | Attending: Emergency Medicine | Admitting: Emergency Medicine

## 2018-03-19 DIAGNOSIS — Z91040 Latex allergy status: Secondary | ICD-10-CM | POA: Diagnosis not present

## 2018-03-19 DIAGNOSIS — I10 Essential (primary) hypertension: Secondary | ICD-10-CM | POA: Insufficient documentation

## 2018-03-19 DIAGNOSIS — R102 Pelvic and perineal pain: Secondary | ICD-10-CM | POA: Diagnosis not present

## 2018-03-19 DIAGNOSIS — Z79899 Other long term (current) drug therapy: Secondary | ICD-10-CM | POA: Insufficient documentation

## 2018-03-19 DIAGNOSIS — R1032 Left lower quadrant pain: Secondary | ICD-10-CM | POA: Diagnosis not present

## 2018-03-19 DIAGNOSIS — R109 Unspecified abdominal pain: Secondary | ICD-10-CM | POA: Diagnosis not present

## 2018-03-19 LAB — I-STAT BETA HCG BLOOD, ED (MC, WL, AP ONLY): I-stat hCG, quantitative: <5 m[IU]/mL (ref <5)

## 2018-03-19 LAB — CBC
HEMATOCRIT: 35.8 % — AB (ref 36.0–46.0)
HEMOGLOBIN: 11.6 g/dL — AB (ref 12.0–15.0)
MCH: 27.3 pg (ref 26.0–34.0)
MCHC: 32.4 g/dL (ref 30.0–36.0)
MCV: 84.2 fL (ref 78.0–100.0)
Platelets: 285 10*3/uL (ref 150–400)
RBC: 4.25 MIL/uL (ref 3.87–5.11)
RDW: 13.4 % (ref 11.5–15.5)
WBC: 4.7 10*3/uL (ref 4.0–10.5)

## 2018-03-19 LAB — COMPREHENSIVE METABOLIC PANEL
ALT: 20 U/L (ref 14–54)
ANION GAP: 5 (ref 5–15)
AST: 17 U/L (ref 15–41)
Albumin: 3.5 g/dL (ref 3.5–5.0)
Alkaline Phosphatase: 30 U/L — ABNORMAL LOW (ref 38–126)
BUN: 12 mg/dL (ref 6–20)
CHLORIDE: 110 mmol/L (ref 101–111)
CO2: 27 mmol/L (ref 22–32)
Calcium: 8.5 mg/dL — ABNORMAL LOW (ref 8.9–10.3)
Creatinine, Ser: 0.63 mg/dL (ref 0.44–1.00)
GFR calc Af Amer: >60 mL/min (ref >60)
Glucose, Bld: 85 mg/dL (ref 65–99)
POTASSIUM: 3.9 mmol/L (ref 3.5–5.1)
Sodium: 142 mmol/L (ref 135–145)
Total Bilirubin: 0.4 mg/dL (ref 0.3–1.2)
Total Protein: 5.9 g/dL — ABNORMAL LOW (ref 6.5–8.1)

## 2018-03-19 LAB — URINALYSIS, ROUTINE W REFLEX MICROSCOPIC
Bilirubin Urine: NEGATIVE
GLUCOSE, UA: NEGATIVE mg/dL
Hgb urine dipstick: NEGATIVE
Ketones, ur: NEGATIVE mg/dL
LEUKOCYTES UA: NEGATIVE
Nitrite: NEGATIVE
PH: 8 (ref 5.0–8.0)
Protein, ur: NEGATIVE mg/dL
SPECIFIC GRAVITY, URINE: 1.016 (ref 1.005–1.030)

## 2018-03-19 LAB — LIPASE, BLOOD: LIPASE: 43 U/L (ref 11–51)

## 2018-03-19 MED ORDER — ONDANSETRON HCL 4 MG PO TABS: 4.0000 mg | ORAL_TABLET | Freq: Four times a day (QID) | ORAL | 0 refills | Status: DC

## 2018-03-19 MED ORDER — ONDANSETRON HCL 4 MG/2ML IJ SOLN
4.0000 mg | Freq: Once | INTRAMUSCULAR | Status: AC
  Administered 2018-03-19: 4 mg via INTRAVENOUS
  Filled 2018-03-19: qty 2

## 2018-03-19 MED ORDER — SODIUM CHLORIDE 0.9 % IV BOLUS
1000.0000 mL | Freq: Once | INTRAVENOUS | Status: AC
  Administered 2018-03-19: 1000 mL via INTRAVENOUS

## 2018-03-19 MED ORDER — OXYCODONE-ACETAMINOPHEN 5-325 MG PO TABS: 1.0000 | ORAL_TABLET | ORAL | 0 refills | Status: DC | PRN

## 2018-03-19 MED ORDER — IOPAMIDOL (ISOVUE-300) INJECTION 61%
100.0000 mL | Freq: Once | INTRAVENOUS | Status: AC | PRN
  Administered 2018-03-19: 100 mL via INTRAVENOUS

## 2018-03-19 MED ORDER — MORPHINE SULFATE (PF) 4 MG/ML IV SOLN
4.0000 mg | Freq: Once | INTRAVENOUS | Status: AC
  Administered 2018-03-19: 4 mg via INTRAVENOUS
  Filled 2018-03-19: qty 1

## 2018-03-19 MED ORDER — KETOROLAC TROMETHAMINE 30 MG/ML IJ SOLN
30.0000 mg | Freq: Once | INTRAMUSCULAR | Status: DC
  Filled 2018-03-19: qty 1

## 2018-03-19 MED ORDER — IOPAMIDOL (ISOVUE-300) INJECTION 61%
INTRAVENOUS | Status: AC
  Filled 2018-03-19: qty 100

## 2018-03-19 MED FILL — OXYCODONE-ACETAMINOPHEN 5-3: 5-325 | 2 days supply | Qty: 15 | Fill #0

## 2018-03-19 NOTE — ED Triage Notes (Signed)
Patient c/o left lower abdominal pain and nausea x 1 month. Patient states she has had an upper endoscopy a few days ago.

## 2018-03-19 NOTE — Telephone Encounter (Signed)
Pt. Reports she woke up with left lower abdominal pain. States she has a EDG a couple of days ago - "It didn't show anything." Has nausea, but states she has this for awhile - after she eats and "the GI doctor knows about it." Pain is rated "8-9". Pt. Sounds like she is in a lot of pain. Having regular BM's. Pt. Working at [employer] to go to ED for evaluation. Verbalizes understanding.  Reason for Disposition . [1] SEVERE pain (e.g., excruciating) AND [2] present > 1 hour  Answer Assessment - Initial Assessment Questions 1. LOCATION: "Where does it hurt?"      Left lower abd. 2. RADIATION: "Does the pain shoot anywhere else?" (e.g., chest, back)     No 3. ONSET: "When did the pain begin?" (e.g., minutes, hours or days ago)      This morning 4. SUDDEN: "Gradual or sudden onset?"     Sudden 5. PATTERN "Does the pain come and go, or is it constant?"    - If constant: "Is it getting better, staying the same, or worsening?"      (Note: Constant means the pain never goes away completely; most serious pain is constant and it progresses)     - If intermittent: "How long does it last?" "Do you have pain now?"     (Note: Intermittent means the pain goes away completely between bouts)     Constant 6. SEVERITY: "How bad is the pain?"  (e.g., Scale 1-10; mild, moderate, or severe)   - MILD (1-3): doesn't interfere with normal activities, abdomen soft and not tender to touch    - MODERATE (4-7): interferes with normal activities or awakens from sleep, tender to touch    - SEVERE (8-10): excruciating pain, doubled over, unable to do any normal activities      8-9 7. RECURRENT SYMPTOM: "Have you ever had this type of abdominal pain before?" If so, ask: "When was the last time?" and "What happened that time?"      No 8. CAUSE: "What do you think is causing the abdominal pain?"     Unsure 9. RELIEVING/AGGRAVATING FACTORS: "What makes it better or worse?" (e.g., movement, antacids,  bowel movement)     Tried a heating pad - helps a little 10. OTHER SYMPTOMS: "Has there been any vomiting, diarrhea, constipation, or urine problems?"       Nausea 11. PREGNANCY: "Is there any chance you are pregnant?" "When was your last menstrual period?"       No  Protocols used: ABDOMINAL PAIN - [facility]

## 2018-03-19 NOTE — Discharge Instructions (Addendum)
Test showed no life-threatening condition.  Prescription for pain and nausea medicine.  Follow-up your doctor.

## 2018-03-20 NOTE — ED Provider Notes (Signed)
[facility] DEPT Provider Note   CSN: [id number] Arrival date & time: 03/19/18  0930     History   Chief Complaint Chief Complaint  Patient presents with  . Abdominal Pain    left lower  . Nausea    HPI Tanya [name] is a 37 y.o. female.  Left lower quadrant abdominal pain with radiation to the suprapubic area for 6 weeks.  She is status post EGD approximately 2 days ago.  She had bariatric surgery to 1 and 1/2 years ago and has lost 125 pounds.  Status post cholecystectomy.  She is scheduled for hysterectomy in approximately 6 days.  Last menstrual period June 7.  No vomiting, diarrhea, dysuria, fever, sweats, chills.  Pelvic exam performed yesterday by GYN.  Severity of pain is mild to moderate.  Nothing makes symptoms better or worse.     Past Medical History:  Diagnosis Date  . Anemia   . Anxiety   . Arthritis    L knee OA  . Depression    history of depression in 11-28-2010 with death of mother  . Hyperlipidemia   . Hypertension   . Migraine   . Obesity 02/2017   has had gastric sleeve    Patient Active Problem List   Diagnosis Date Noted  . Nausea without vomiting 03/09/2018  . LUQ abdominal pain 03/09/2018  . Bowel habit changes 12/08/2017  . Bloating 12/08/2017  . Abdominal pain 09/04/2017  . Anal fissure 09/04/2017  . Constipation 09/04/2017  . S/P laparoscopic sleeve gastrectomy May 2018 02/17/2017  . Iron deficiency anemia due to chronic blood loss 02/01/2017  . SVT (supraventricular tachycardia) ([place])   . Anemia due to vitamin B12 deficiency 12/08/2016  . Menometrorrhagia 12/05/2016  . Morbid obesity ([place]) 12/05/2016  . Chronic migraine without aura without status migrainosus, not intractable 12/05/2016  . Ruptured left tubal ectopic pregnancy causing hemoperitoneum 06/10/2014    Past Surgical History:  Procedure Laterality Date  . ANAL RECTAL MANOMETRY N/A 01/11/2018   Procedure: ANO RECTAL MANOMETRY;  Surgeon:  Mauri Pole, MD;  Location: [facility] ENDOSCOPY;  Service: Endoscopy;  Laterality: N/A;  . CARPAL TUNNEL RELEASE Bilateral   . CESAREAN SECTION     x 1  . CHOLECYSTECTOMY    . KNEE SURGERY Left   . LAPAROSCOPIC GASTRIC SLEEVE RESECTION N/A 02/17/2017   Procedure: LAPAROSCOPIC GASTRIC SLEEVE RESECTION, UPPER ENDO;  Surgeon: Johnathan Hausen, MD;  Location: [facility] OR[facility];  Service: General;  Laterality: N/A;  . LAPAROSCOPIC UNILATERAL SALPINGECTOMY Left 06/10/2014   Procedure: LAPAROSCOPIC Left SALPINGECTOMY, removal of ectopic pregnancy;  Surgeon: Guss Bunde, MD;  Location: [facility];  Service: Gynecology;  Laterality: Left;  . TUBAL LIGATION    . WISDOM TOOTH EXTRACTION       OB History    Gravida  3   Para  2   Term  1   Preterm      AB      Living  2     SAB      TAB      Ectopic      Multiple      Live Births  2            Home Medications    Prior to Admission medications   Medication Sig Start Date End Date Taking? Authorizing Provider  ALPRAZolam Duanne Moron) 0.5 MG tablet Take 1 tablet (0.5 mg total) by mouth daily as needed for anxiety. 03/08/18  Yes Reginia Forts  M, MD  Biotin 10000 MCG TABS Take 1 tablet by mouth daily.   Yes [provider]  CALCIUM PO Take 1 tablet 3 (three) times daily by mouth.   Yes [provider]  dicyclomine (BENTYL) 20 MG tablet Take 1 tablet (20 mg total) by mouth every 6 (six) hours. Patient taking differently: Take 20 mg by mouth daily as needed for spasms.  03/09/18  Yes Zehr, Laban Emperor, PA-C  FLUoxetine (PROZAC) 20 MG tablet Take 2 tablets (40 mg total) by mouth daily. 03/08/18  Yes Wardell Honour, MD  Melatonin 5 MG TABS Take 5 mg by mouth at bedtime.   Yes [provider]  Multiple Vitamins-Minerals (BARIATRIC MULTIVITAMINS/IRON) CAPS Take 1 tablet by mouth 2 (two) times daily.   Yes [provider]  pantoprazole sodium (PROTONIX) 40 mg/20 mL PACK Protonix 40 mg granules delayed-release packet  Take  1 packet every day by oral route.   Yes [provider]  promethazine (PHENERGAN) 12.5 MG tablet Take 1 tablet (12.5 mg total) by mouth every 6 (six) hours as needed for nausea or vomiting. 03/09/18  Yes Zehr, Laban Emperor, PA-C  sucralfate (CARAFATE) 1 GM/10ML suspension Carafate 100 mg/mL oral suspension   Yes [provider]  topiramate (TOPAMAX) 100 MG tablet TAKE 2 TABLETS BY MOUTH TWICE DAILY 01/26/18  Yes Wardell Honour, MD  vitamin B-12 1000 MCG tablet Take 1 tablet (1,000 mcg total) by mouth daily. 12/13/16  Yes Barton Dubois, MD  diltiazem 2 % GEL Apply a pea-sized amount to the affected area 3 times daily. Patient not taking: Reported on 03/19/2018 08/08/17   Wardell Honour, MD  NONFORMULARY OR COMPOUNDED ITEM Nitro Gel 0.125 % twice daily for 8-10 weeks. Patient not taking: Reported on 03/19/2018 09/04/17   Zehr, Janett Billow D, PA-C  ondansetron (ZOFRAN) 4 MG tablet Take 1 tablet (4 mg total) by mouth every 6 (six) hours. 03/19/18   Nat Christen, MD  oxyCODONE-acetaminophen (PERCOCET) 5-325 MG tablet Take 1 tablet by mouth every 4 (four) hours as needed. 03/19/18   Nat Christen, MD    Family History Family History  Problem Relation Age of Onset  . Breast cancer Mother 45  . Cancer Mother   . Prostate cancer Father 93  . Colon polyps Father   . Breast cancer Maternal Aunt        dx in her 61s; BRCA neg  . Prostate cancer Paternal Uncle   . Diabetes Paternal Uncle   . Heart disease Maternal Grandmother   . Diabetes Paternal Grandmother   . Prostate cancer Paternal Grandfather   . Ovarian cancer Sister 107       found during her pregnancy  . Prostate cancer Paternal Uncle   . Prostate cancer Paternal Uncle   . Cancer Other   . Stroke Other   . Diabetes Other   . Coronary artery disease Other   . Colon cancer Neg Hx   . Esophageal cancer Neg Hx   . Rectal cancer Neg Hx     Social History Social History   Tobacco Use  . Smoking status: Never Smoker  . Smokeless  tobacco: Never Used  Substance Use Topics  . Alcohol use: Yes    Comment: socially  . Drug use: No     Allergies   Imitrex [sumatriptan]; Zomig [zolmitriptan]; Latex; Ibuprofen; and Sulfa antibiotics   Review of Systems Review of Systems  All other systems reviewed and are negative.    Physical Exam  Updated Vital Signs BP 109/63   Pulse (!) 49   Temp (!) 97.5 F (36.4 C) (Oral)   Resp 14   Ht _0  (1.575 m)   Wt 73 kg (161 lb)   LMP 03/12/2018   SpO2 100%   BMI 29.45 kg/m   Physical Exam  Constitutional: She is oriented to person, place, and time. She appears well-developed and well-nourished.  HENT:  Head: Normocephalic and atraumatic.  Eyes: Conjunctivae are normal.  Neck: Neck supple.  Cardiovascular: Normal rate and regular rhythm.  Pulmonary/Chest: Effort normal and breath sounds normal.  Abdominal: Soft. Bowel sounds are normal.  Minimal left lower quadrant tenderness.  No masses.  Musculoskeletal: Normal range of motion.  Neurological: She is alert and oriented to person, place, and time.  Skin: Skin is warm and dry.  Psychiatric: She has a normal mood and affect. Her behavior is normal.  Nursing note and vitals reviewed.    ED Treatments / Results  Labs (all labs ordered are listed, but only abnormal results are displayed) Labs Reviewed  COMPREHENSIVE METABOLIC PANEL - Abnormal; Notable for the following components:      Result Value   Calcium 8.5 (*)    Total Protein 5.9 (*)    Alkaline Phosphatase 30 (*)    All other components within normal limits  CBC - Abnormal; Notable for the following components:   Hemoglobin 11.6 (*)    HCT 35.8 (*)    All other components within normal limits  URINALYSIS, ROUTINE W REFLEX MICROSCOPIC - Abnormal; Notable for the following components:   APPearance CLOUDY (*)    All other components within normal limits  LIPASE, BLOOD  I-STAT BETA HCG BLOOD, ED ([facility], [facility], [facility] ONLY)    EKG None  Radiology Ct  Abdomen Pelvis W Contrast  Result Date: 03/19/2018 CLINICAL DATA:  Abdominal pain and nausea EXAM: CT ABDOMEN AND PELVIS WITH CONTRAST TECHNIQUE: Multidetector CT imaging of the abdomen and pelvis was performed using the standard protocol following bolus administration of intravenous contrast. CONTRAST:  133m ISOVUE-300 IOPAMIDOL (ISOVUE-300) INJECTION 61% COMPARISON:  November 13, 2017 FINDINGS: Lower chest: Lung bases are clear. Hepatobiliary: There is fatty infiltration near the fissure for the ligamentum teres. No focal liver lesions are evident. Gallbladder is absent. There is no appreciable biliary duct dilatation. Pancreas: There is no pancreatic mass or inflammatory focus. Spleen: No splenic lesions are evident. Adrenals/Urinary Tract: Adrenals appear unremarkable bilaterally. Kidneys bilaterally show no evident mass or hydronephrosis on either side. There is no evident renal or ureteral calculus on either side. Urinary bladder is midline with wall thickness within normal limits. Stomach/Bowel: Patient is status post sleeve resection procedure. There is no wall thickening or fluid in the perioperative regions. Elsewhere, there is no appreciable bowel wall or mesenteric thickening. There is no appreciable bowel obstruction. No evident free air or portal venous air. Vascular/Lymphatic: There is mild aortic atherosclerosis. There is calcification in the right common iliac artery. No evident aneurysm. Major mesenteric arterial vessels are patent. There is no evident adenopathy in the abdomen or pelvis. Reproductive: Uterus is anteverted and rather prominent. The uterus has a mildly edematous appearance, similar to previous study. The enhancement of the uterus is mildly inhomogeneous. A well-defined mass is not seen, however. No extrauterine pelvic mass evident. Other: Appendix appears normal. No abscess or ascites is evident in the abdomen or pelvis. There is slight generalized edema in the abdominal wall  diffusely and to a slight extent in the abdominal mesentery.  Musculoskeletal: No blastic or lytic bone lesions. No intramuscular lesions are evident. IMPRESSION: 1. Status post gastric sleeve resection without bowel wall thickening or abnormal fluid. No evident bowel obstruction. No abscess. Appendix appears normal. 2. There is slight edematous change in the abdominal wall diffusely and in the mesentery to a slight extent. Question hypoalbuminemia, a possible consequence of the previous gastric bypass procedure. Appropriate laboratory correlation advised. No abnormal fluid collection in the abdominal wall or in the mesentery. 3. Uterus again has a slightly edematous appearance, conceivably related to the other changes. Uterus appears rather prominent has a somewhat inhomogeneous enhancement appearance. It may be prudent to correlate with pelvic ultrasound in this regard. 4.  Gallbladder absent. 5.  No renal or ureteral calculus.  No hydronephrosis. 6. There are atherosclerotic changes in the aorta and right common iliac artery. Aortic Atherosclerosis (ICD10-I70.0). Electronically Signed   By: Lowella Grip III M.D.   On: 03/19/2018 12:24    Procedures Procedures (including critical care time)  Medications Ordered in ED Medications  ondansetron (ZOFRAN) injection 4 mg (4 mg Intravenous Given 03/19/18 1147)  sodium chloride 0.9 % bolus 1,000 mL (0 mLs Intravenous Stopped 03/19/18 1313)  morphine 4 MG/ML injection 4 mg (4 mg Intravenous Given 03/19/18 1147)  iopamidol (ISOVUE-300) 61 % injection 100 mL (100 mLs Intravenous Contrast Given 03/19/18 1152)  ondansetron (ZOFRAN) injection 4 mg (4 mg Intravenous Given 03/19/18 1522)     Initial Impression / Assessment and Plan / ED Course  I have reviewed the triage vital signs and the nursing notes.  Pertinent labs & imaging results that were available during my care of the patient were reviewed by me and considered in my medical decision making (see chart  for details).     Patient presents with left lower quadrant pain for several weeks.  She has been evaluated by GYN.  Labs reassuring.  CT abdomen pelvis reveals edematous changes in the abdominal wall, but no other acute pathology.  This is not visible on physical exam.  No acute abdomen on discharge.  Discharge medications Percocet and Zofran 4 mg Final Clinical Impressions(s) / ED Diagnoses   Final diagnoses:  Abdominal pain, unspecified abdominal location    ED Discharge Orders        Ordered    oxyCODONE-acetaminophen (PERCOCET) 5-325 MG tablet  Every 4 hours PRN     03/19/18 1524    ondansetron (ZOFRAN) 4 MG tablet  Every 6 hours     03/19/18 1524       Nat Christen, MD 03/20/18 [phone]

## 2018-03-22 ENCOUNTER — Encounter (HOSPITAL_COMMUNITY)
Admission: RE | Admit: 2018-03-22 | Discharge: 2018-03-22 | Disposition: A | Discharge status: Home / Self Care | Payer: 59 | Source: Ambulatory Visit | Attending: Obstetrics and Gynecology | Admitting: Obstetrics and Gynecology

## 2018-03-22 ENCOUNTER — Other Ambulatory Visit: Payer: Self-pay

## 2018-03-22 ENCOUNTER — Encounter (HOSPITAL_COMMUNITY): Payer: Self-pay

## 2018-03-22 DIAGNOSIS — N946 Dysmenorrhea, unspecified: Secondary | ICD-10-CM | POA: Diagnosis not present

## 2018-03-22 DIAGNOSIS — Z01818 Encounter for other preprocedural examination: Secondary | ICD-10-CM | POA: Insufficient documentation

## 2018-03-22 DIAGNOSIS — R001 Bradycardia, unspecified: Secondary | ICD-10-CM | POA: Insufficient documentation

## 2018-03-22 DIAGNOSIS — N920 Excessive and frequent menstruation with regular cycle: Secondary | ICD-10-CM | POA: Insufficient documentation

## 2018-03-22 DIAGNOSIS — R102 Pelvic and perineal pain: Secondary | ICD-10-CM | POA: Insufficient documentation

## 2018-03-22 DIAGNOSIS — D259 Leiomyoma of uterus, unspecified: Secondary | ICD-10-CM | POA: Diagnosis not present

## 2018-03-22 DIAGNOSIS — I10 Essential (primary) hypertension: Secondary | ICD-10-CM | POA: Diagnosis not present

## 2018-03-22 LAB — CBC
HCT: 36.4 % (ref 36.0–46.0)
HEMOGLOBIN: 11.5 g/dL — AB (ref 12.0–15.0)
MCH: 26.6 pg (ref 26.0–34.0)
MCHC: 31.6 g/dL (ref 30.0–36.0)
MCV: 84.1 fL (ref 78.0–100.0)
Platelets: 262 10*3/uL (ref 150–400)
RBC: 4.33 MIL/uL (ref 3.87–5.11)
RDW: 13.5 % (ref 11.5–15.5)
WBC: 5.1 10*3/uL (ref 4.0–10.5)

## 2018-03-22 LAB — ABO/RH: ABO/RH(D): A POS

## 2018-03-22 LAB — PREGNANCY, URINE: Preg Test, Ur: NEGATIVE

## 2018-03-22 NOTE — Progress Notes (Signed)
12-11-16 (Epic) ECHO

## 2018-03-22 NOTE — Patient Instructions (Addendum)
Tanya [name]  03/22/2018      Your procedure is scheduled on 03-25-18   Report to [facility]  at  10:00 A.M.  Call this number if you have problems the morning of surgery:[phone]  OUR ADDRESS IS [place], WE ARE LOCATED [facility] WITH [facility].   Remember:  Do not eat food or drink liquids after midnight.NO SOLID FOOD AFTER MIDNIGHT THE NIGHT PRIOR TO SURGERY. NOTHING BY MOUTH EXCEPT CLEAR LIQUIDS UNTIL 3 HOURS PRIOR TO [place] SURGERY. PLEASE FINISH ENSURE DRINK PER SURGEON ORDER 3 HOURS PRIOR TO SCHEDULED SURGERY TIME WHICH NEEDS TO BE COMPLETED AT 9:00 AM.     CLEAR LIQUID DIET   Foods Allowed                                                                     Foods Excluded  Coffee and tea, regular and decaf                             liquids that you cannot  Plain Jell-O in any flavor                                             see through such as: Fruit ices (not with fruit pulp)                                     milk, soups, orange juice  Iced Popsicles                                    All solid food Carbonated beverages, regular and diet                                    Cranberry, grape and apple juices Sports drinks like Gatorade Lightly seasoned clear broth or consume(fat free) Sugar, honey syrup  Sample Menu Breakfast                                Lunch                                     Supper Cranberry juice                    Beef broth                            Chicken broth Jell-O  Grape juice                           Apple juice Coffee or tea                        Jell-O                                      Popsicle                                                Coffee or tea                        Coffee or tea  _____________________________________________________________________    Take these medicines the morning of surgery with A SIP OF  WATER:Topiramate (Topamax), and Xanax (Alprazolam) as needed.   Do not wear jewelry, make-up or nail polish.  Do not wear lotions, powders, or perfumes, or deoderant.  Do not shave 48 hours prior to surgery.   Do not bring valuables to the hospital.  [facility] is not responsible for any belongings or valuables.  Contacts, dentures or bridgework may not be worn into surgery.  Leave your suitcase in the car.  After surgery it may be brought to your room.  For patients admitted to the hospital, discharge time will be determined by your treatment team.  Patients discharged the day of surgery will not be allowed to drive home.   Special instructions:  See Patient Guidelines for [place]  Please read over the following fact sheets that you were given:       [facility] - Preparing for Surgery Before surgery, you can play an important role.  Because skin is not sterile, your skin needs to be as free of germs as possible.  You can reduce the number of germs on your skin by washing with CHG (chlorahexidine gluconate) soap before surgery.  CHG is an antiseptic cleaner which kills germs and bonds with the skin to continue killing germs even after washing. Please DO NOT use if you have an allergy to CHG or antibacterial soaps.  If your skin becomes reddened/irritated stop using the CHG and inform your nurse when you arrive at Short Stay. Do not shave (including legs and underarms) for at least 48 hours prior to the first CHG shower.  You may shave your face/neck. Please follow these instructions carefully:  1.  Shower with CHG Soap the night before surgery and the  morning of Surgery.  2.  If you choose to wash your hair, wash your hair first as usual with your  normal  shampoo.  3.  After you shampoo, rinse your hair and body thoroughly to remove the  shampoo.                           4.  Use CHG as you would any other liquid soap.  You can apply chg directly  to the skin and wash                        Gently with a scrungie or  clean washcloth.  5.  Apply the CHG Soap to your body ONLY FROM THE NECK DOWN.   Do not use on face/ open                           Wound or open sores. Avoid contact with eyes, ears mouth and genitals (private parts).                       Wash face,  Genitals (private parts) with your normal soap.             6.  Wash thoroughly, paying special attention to the area where your surgery  will be performed.  7.  Thoroughly rinse your body with warm water from the neck down.  8.  DO NOT shower/wash with your normal soap after using and rinsing off  the CHG Soap.                9.  Pat yourself dry with a clean towel.            10.  Wear clean pajamas.            11.  Place clean sheets on your bed the night of your first shower and do not  sleep with pets. Day of Surgery : Do not apply any lotions/deodorants the morning of surgery.  Please wear clean clothes to the hospital/surgery center.  FAILURE TO FOLLOW THESE INSTRUCTIONS MAY RESULT IN THE CANCELLATION OF YOUR SURGERY PATIENT SIGNATURE_________________________________  NURSE SIGNATURE__________________________________  ________________________________________________________________________

## 2018-03-25 ENCOUNTER — Encounter (HOSPITAL_BASED_OUTPATIENT_CLINIC_OR_DEPARTMENT_OTHER): Payer: Self-pay

## 2018-03-25 ENCOUNTER — Encounter (HOSPITAL_BASED_OUTPATIENT_CLINIC_OR_DEPARTMENT_OTHER)
Admission: RE | Disposition: A | Discharge status: Home / Self Care | Payer: Self-pay | Source: Ambulatory Visit | Attending: Obstetrics and Gynecology

## 2018-03-25 ENCOUNTER — Ambulatory Visit (HOSPITAL_BASED_OUTPATIENT_CLINIC_OR_DEPARTMENT_OTHER): Payer: 59 | Admitting: Anesthesiology

## 2018-03-25 ENCOUNTER — Observation Stay (HOSPITAL_BASED_OUTPATIENT_CLINIC_OR_DEPARTMENT_OTHER)
Admission: RE | Admit: 2018-03-25 | Discharge: 2018-03-26 | Disposition: A | Discharge status: Home / Self Care | Payer: 59 | Source: Ambulatory Visit | Attending: Obstetrics and Gynecology | Admitting: Obstetrics and Gynecology

## 2018-03-25 DIAGNOSIS — D259 Leiomyoma of uterus, unspecified: Principal | ICD-10-CM | POA: Insufficient documentation

## 2018-03-25 DIAGNOSIS — N921 Excessive and frequent menstruation with irregular cycle: Secondary | ICD-10-CM | POA: Diagnosis present

## 2018-03-25 DIAGNOSIS — F419 Anxiety disorder, unspecified: Secondary | ICD-10-CM | POA: Diagnosis not present

## 2018-03-25 DIAGNOSIS — N946 Dysmenorrhea, unspecified: Secondary | ICD-10-CM | POA: Insufficient documentation

## 2018-03-25 DIAGNOSIS — N852 Hypertrophy of uterus: Secondary | ICD-10-CM | POA: Diagnosis not present

## 2018-03-25 DIAGNOSIS — R102 Pelvic and perineal pain: Secondary | ICD-10-CM | POA: Diagnosis not present

## 2018-03-25 DIAGNOSIS — Z79899 Other long term (current) drug therapy: Secondary | ICD-10-CM | POA: Diagnosis not present

## 2018-03-25 DIAGNOSIS — D649 Anemia, unspecified: Secondary | ICD-10-CM | POA: Diagnosis not present

## 2018-03-25 DIAGNOSIS — N920 Excessive and frequent menstruation with regular cycle: Secondary | ICD-10-CM | POA: Diagnosis not present

## 2018-03-25 HISTORY — PX: VAGINAL HYSTERECTOMY: SHX2639

## 2018-03-25 LAB — TYPE AND SCREEN
ABO/RH(D): A POS
ANTIBODY SCREEN: NEGATIVE

## 2018-03-25 SURGERY — HYSTERECTOMY, VAGINAL
Anesthesia: General | Laterality: Bilateral

## 2018-03-25 MED ORDER — KETOROLAC TROMETHAMINE 15 MG/ML IJ SOLN
15.0000 mg | INTRAMUSCULAR | Status: DC
  Filled 2018-03-25: qty 1

## 2018-03-25 MED ORDER — KETOROLAC TROMETHAMINE 30 MG/ML IJ SOLN
INTRAMUSCULAR | Status: DC | PRN
  Administered 2018-03-25: 15 mg via INTRAVENOUS
  Administered 2018-03-25: 30 mg via INTRAVENOUS

## 2018-03-25 MED ORDER — KETAMINE HCL 10 MG/ML IJ SOLN
INTRAMUSCULAR | Status: AC
  Filled 2018-03-25: qty 1

## 2018-03-25 MED ORDER — TOPIRAMATE 100 MG PO TABS
200.0000 mg | ORAL_TABLET | Freq: Two times a day (BID) | ORAL | Status: DC
  Administered 2018-03-25 – 2018-03-26 (×2): 200 mg via ORAL
  Filled 2018-03-25 (×2): qty 2

## 2018-03-25 MED ORDER — HYDROMORPHONE HCL 1 MG/ML IJ SOLN
INTRAMUSCULAR | Status: AC
  Filled 2018-03-25: qty 1

## 2018-03-25 MED ORDER — LIDOCAINE HCL 2 % IJ SOLN
INTRAMUSCULAR | Status: DC | PRN
  Administered 2018-03-25: 20 mL

## 2018-03-25 MED ORDER — FENTANYL CITRATE (PF) 250 MCG/5ML IJ SOLN
INTRAMUSCULAR | Status: AC
  Filled 2018-03-25: qty 5

## 2018-03-25 MED ORDER — KETOROLAC TROMETHAMINE 30 MG/ML IJ SOLN
INTRAMUSCULAR | Status: AC
Start: 2018-03-25 — End: ?
  Filled 2018-03-25: qty 1

## 2018-03-25 MED ORDER — ROCURONIUM BROMIDE 100 MG/10ML IV SOLN
INTRAVENOUS | Status: DC | PRN
  Administered 2018-03-25: 50 mg via INTRAVENOUS

## 2018-03-25 MED ORDER — FENTANYL CITRATE (PF) 100 MCG/2ML IJ SOLN
INTRAMUSCULAR | Status: DC | PRN
  Administered 2018-03-25: 25 ug via INTRAVENOUS
  Administered 2018-03-25 (×3): 50 ug via INTRAVENOUS
  Administered 2018-03-25: 25 ug via INTRAVENOUS

## 2018-03-25 MED ORDER — HYDROMORPHONE HCL 1 MG/ML IJ SOLN
0.2500 mg | INTRAMUSCULAR | Status: DC | PRN
  Administered 2018-03-25 (×2): 0.5 mg via INTRAVENOUS
  Administered 2018-03-25: 0.25 mg via INTRAVENOUS
  Administered 2018-03-25: 0.5 mg via INTRAVENOUS
  Administered 2018-03-25: 0.25 mg via INTRAVENOUS
  Filled 2018-03-25: qty 0.5

## 2018-03-25 MED ORDER — ACETAMINOPHEN 500 MG PO TABS
1000.0000 mg | ORAL_TABLET | ORAL | Status: AC
  Administered 2018-03-25: 1000 mg via ORAL
  Filled 2018-03-25: qty 2

## 2018-03-25 MED ORDER — PROMETHAZINE HCL 25 MG/ML IJ SOLN
6.2500 mg | INTRAMUSCULAR | Status: DC | PRN
  Filled 2018-03-25: qty 1

## 2018-03-25 MED ORDER — SODIUM CHLORIDE 0.9 % IV SOLN
INTRAVENOUS | Status: AC
  Filled 2018-03-25: qty 2

## 2018-03-25 MED ORDER — KETOROLAC TROMETHAMINE 30 MG/ML IJ SOLN
30.0000 mg | Freq: Four times a day (QID) | INTRAMUSCULAR | Status: AC
  Administered 2018-03-25 – 2018-03-26 (×3): 30 mg via INTRAVENOUS
  Filled 2018-03-25: qty 1

## 2018-03-25 MED ORDER — ONDANSETRON HCL 4 MG/2ML IJ SOLN
4.0000 mg | Freq: Three times a day (TID) | INTRAMUSCULAR | Status: DC
  Administered 2018-03-25 – 2018-03-26 (×2): 4 mg via INTRAVENOUS
  Filled 2018-03-25: qty 2

## 2018-03-25 MED ORDER — MEPERIDINE HCL 25 MG/ML IJ SOLN
6.2500 mg | INTRAMUSCULAR | Status: DC | PRN
  Filled 2018-03-25: qty 1

## 2018-03-25 MED ORDER — KETOROLAC TROMETHAMINE 30 MG/ML IJ SOLN
INTRAMUSCULAR | Status: AC
  Filled 2018-03-25: qty 1

## 2018-03-25 MED ORDER — TRAMADOL HCL 50 MG PO TABS
100.0000 mg | ORAL_TABLET | Freq: Four times a day (QID) | ORAL | Status: DC | PRN
  Administered 2018-03-26: 100 mg via ORAL
  Filled 2018-03-25: qty 2

## 2018-03-25 MED ORDER — GLYCOPYRROLATE PF 0.2 MG/ML IJ SOSY
PREFILLED_SYRINGE | INTRAMUSCULAR | Status: AC
  Filled 2018-03-25: qty 1

## 2018-03-25 MED ORDER — PROPOFOL 10 MG/ML IV BOLUS
INTRAVENOUS | Status: AC
  Filled 2018-03-25: qty 40

## 2018-03-25 MED ORDER — ONDANSETRON HCL 4 MG/2ML IJ SOLN
INTRAMUSCULAR | Status: DC | PRN
  Administered 2018-03-25: 4 mg via INTRAVENOUS

## 2018-03-25 MED ORDER — OXYCODONE-ACETAMINOPHEN 5-325 MG PO TABS
1.0000 | ORAL_TABLET | Freq: Four times a day (QID) | ORAL | Status: DC | PRN
Start: 2018-03-25 — End: 2018-03-26
  Administered 2018-03-26: 2 via ORAL
  Administered 2018-03-26 (×2): 1 via ORAL
  Filled 2018-03-25: qty 2

## 2018-03-25 MED ORDER — EPHEDRINE SULFATE 50 MG/ML IJ SOLN
INTRAMUSCULAR | Status: DC | PRN
  Administered 2018-03-25: 15 mg via INTRAVENOUS

## 2018-03-25 MED ORDER — TRAMADOL HCL 50 MG PO TABS
ORAL_TABLET | ORAL | Status: AC
  Filled 2018-03-25: qty 2

## 2018-03-25 MED ORDER — LIDOCAINE 2% (20 MG/ML) 5 ML SYRINGE
INTRAMUSCULAR | Status: DC | PRN
  Administered 2018-03-25: 3.75 mL/h via INTRAVENOUS

## 2018-03-25 MED ORDER — DOCUSATE SODIUM 100 MG PO CAPS
ORAL_CAPSULE | ORAL | Status: AC
  Filled 2018-03-25: qty 1

## 2018-03-25 MED ORDER — KETAMINE HCL 10 MG/ML IJ SOLN
INTRAMUSCULAR | Status: DC | PRN
  Administered 2018-03-25: 10 mg via INTRAVENOUS
  Administered 2018-03-25: 15 mg via INTRAVENOUS

## 2018-03-25 MED ORDER — SUGAMMADEX SODIUM 200 MG/2ML IV SOLN
INTRAVENOUS | Status: DC | PRN
  Administered 2018-03-25: 160 mg via INTRAVENOUS

## 2018-03-25 MED ORDER — LIDOCAINE 2% (20 MG/ML) 5 ML SYRINGE
INTRAMUSCULAR | Status: AC
  Filled 2018-03-25: qty 5

## 2018-03-25 MED ORDER — LACTATED RINGERS IV SOLN
INTRAVENOUS | Status: DC
  Administered 2018-03-26 (×2): via INTRAVENOUS
  Filled 2018-03-25: qty 1000

## 2018-03-25 MED ORDER — FLUOXETINE HCL 20 MG PO TABS
40.0000 mg | ORAL_TABLET | Freq: Every day | ORAL | Status: DC
  Administered 2018-03-26: 40 mg via ORAL
  Filled 2018-03-25 (×2): qty 2

## 2018-03-25 MED ORDER — VASOPRESSIN 20 UNIT/ML IV SOLN
INTRAVENOUS | Status: DC | PRN
  Administered 2018-03-25: 40 mL via INTRAMUSCULAR

## 2018-03-25 MED ORDER — PANTOPRAZOLE SODIUM 40 MG PO TBEC
40.0000 mg | DELAYED_RELEASE_TABLET | Freq: Every day | ORAL | Status: DC
  Filled 2018-03-25: qty 1

## 2018-03-25 MED ORDER — PANTOPRAZOLE SODIUM 40 MG PO TBEC
40.0000 mg | DELAYED_RELEASE_TABLET | Freq: Every day | ORAL | Status: DC
  Administered 2018-03-25 – 2018-03-26 (×2): 40 mg via ORAL
  Filled 2018-03-25: qty 1

## 2018-03-25 MED ORDER — DOCUSATE SODIUM 100 MG PO CAPS
100.0000 mg | ORAL_CAPSULE | Freq: Two times a day (BID) | ORAL | Status: DC
  Administered 2018-03-25 – 2018-03-26 (×2): 100 mg via ORAL
  Filled 2018-03-25: qty 1

## 2018-03-25 MED ORDER — GABAPENTIN 300 MG PO CAPS
ORAL_CAPSULE | ORAL | Status: AC
  Filled 2018-03-25: qty 1

## 2018-03-25 MED ORDER — HYDROMORPHONE HCL 1 MG/ML IJ SOLN
1.0000 mg | INTRAMUSCULAR | Status: DC | PRN
  Administered 2018-03-25: 1 mg via INTRAVENOUS
  Filled 2018-03-25: qty 1

## 2018-03-25 MED ORDER — ONDANSETRON HCL 4 MG/2ML IJ SOLN
INTRAMUSCULAR | Status: AC
Start: 2018-03-25 — End: ?
  Filled 2018-03-25: qty 2

## 2018-03-25 MED ORDER — DEXAMETHASONE SODIUM PHOSPHATE 10 MG/ML IJ SOLN
INTRAMUSCULAR | Status: AC
  Filled 2018-03-25: qty 1

## 2018-03-25 MED ORDER — SUCCINYLCHOLINE CHLORIDE 200 MG/10ML IV SOSY
PREFILLED_SYRINGE | INTRAVENOUS | Status: AC
  Filled 2018-03-25: qty 10

## 2018-03-25 MED ORDER — SCOPOLAMINE 1 MG/3DAYS TD PT72
1.0000 | MEDICATED_PATCH | TRANSDERMAL | Status: DC
  Administered 2018-03-25: 1.5 mg via TRANSDERMAL
  Filled 2018-03-25: qty 1

## 2018-03-25 MED ORDER — OXYCODONE-ACETAMINOPHEN 5-325 MG PO TABS
ORAL_TABLET | ORAL | Status: AC
  Filled 2018-03-25: qty 1

## 2018-03-25 MED ORDER — ONDANSETRON HCL 4 MG PO TABS
4.0000 mg | ORAL_TABLET | Freq: Three times a day (TID) | ORAL | Status: DC | PRN
  Filled 2018-03-25: qty 1

## 2018-03-25 MED ORDER — LACTATED RINGERS IV SOLN
INTRAVENOUS | Status: DC
  Administered 2018-03-25 (×2): via INTRAVENOUS
  Filled 2018-03-25: qty 1000

## 2018-03-25 MED ORDER — LIDOCAINE 2% (20 MG/ML) 5 ML SYRINGE
INTRAMUSCULAR | Status: AC
Start: 2018-03-25 — End: ?
  Filled 2018-03-25: qty 5

## 2018-03-25 MED ORDER — DEXAMETHASONE SODIUM PHOSPHATE 10 MG/ML IJ SOLN
INTRAMUSCULAR | Status: DC | PRN
  Administered 2018-03-25: 10 mg via INTRAVENOUS

## 2018-03-25 MED ORDER — ALPRAZOLAM 0.5 MG PO TABS
0.5000 mg | ORAL_TABLET | Freq: Every day | ORAL | Status: DC | PRN
  Administered 2018-03-25: 0.5 mg via ORAL
  Filled 2018-03-25: qty 1

## 2018-03-25 MED ORDER — SUGAMMADEX SODIUM 200 MG/2ML IV SOLN
INTRAVENOUS | Status: AC
  Filled 2018-03-25: qty 2

## 2018-03-25 MED ORDER — GABAPENTIN 300 MG PO CAPS
300.0000 mg | ORAL_CAPSULE | Freq: Once | ORAL | Status: AC
  Administered 2018-03-25: 300 mg via ORAL
  Filled 2018-03-25: qty 1

## 2018-03-25 MED ORDER — TRAMADOL HCL 50 MG PO TABS
100.0000 mg | ORAL_TABLET | Freq: Four times a day (QID) | ORAL | Status: DC
  Administered 2018-03-25: 100 mg via ORAL
  Filled 2018-03-25: qty 2

## 2018-03-25 MED ORDER — LIDOCAINE 2% (20 MG/ML) 5 ML SYRINGE
INTRAMUSCULAR | Status: DC | PRN
  Administered 2018-03-25: 50 mg via INTRAVENOUS

## 2018-03-25 MED ORDER — ACETAMINOPHEN 500 MG PO TABS
ORAL_TABLET | ORAL | Status: AC
  Filled 2018-03-25: qty 2

## 2018-03-25 MED ORDER — MIDAZOLAM HCL 2 MG/2ML IJ SOLN
0.5000 mg | Freq: Once | INTRAMUSCULAR | Status: DC | PRN
  Filled 2018-03-25: qty 2

## 2018-03-25 MED ORDER — GLYCOPYRROLATE 0.2 MG/ML IJ SOLN
INTRAMUSCULAR | Status: DC | PRN
  Administered 2018-03-25: 0.2 mg via INTRAVENOUS

## 2018-03-25 MED ORDER — PROPOFOL 10 MG/ML IV BOLUS
INTRAVENOUS | Status: DC | PRN
  Administered 2018-03-25: 120 mg via INTRAVENOUS

## 2018-03-25 MED ORDER — SCOPOLAMINE 1 MG/3DAYS TD PT72
MEDICATED_PATCH | TRANSDERMAL | Status: AC
  Filled 2018-03-25: qty 1

## 2018-03-25 MED ORDER — FLUOXETINE HCL 20 MG PO TABS
40.0000 mg | ORAL_TABLET | Freq: Every day | ORAL | Status: DC
  Filled 2018-03-25: qty 2

## 2018-03-25 MED ORDER — MIDAZOLAM HCL 2 MG/2ML IJ SOLN
INTRAMUSCULAR | Status: AC
  Filled 2018-03-25: qty 2

## 2018-03-25 MED ORDER — ONDANSETRON HCL 4 MG/2ML IJ SOLN
INTRAMUSCULAR | Status: AC
  Filled 2018-03-25: qty 2

## 2018-03-25 MED ORDER — MENTHOL 3 MG MT LOZG
1.0000 | LOZENGE | OROMUCOSAL | Status: DC | PRN
  Filled 2018-03-25: qty 9

## 2018-03-25 MED ORDER — SODIUM CHLORIDE 0.9 % IV SOLN
2.0000 g | INTRAVENOUS | Status: AC
  Administered 2018-03-25: 2 g via INTRAVENOUS
  Filled 2018-03-25: qty 2

## 2018-03-25 MED ORDER — MIDAZOLAM HCL 5 MG/5ML IJ SOLN
INTRAMUSCULAR | Status: DC | PRN
  Administered 2018-03-25: 2 mg via INTRAVENOUS

## 2018-03-25 SURGICAL SUPPLY — 25 items
CANISTER SUCT 3000ML PPV (MISCELLANEOUS) ×3 IMPLANT
DECANTER SPIKE VIAL GLASS SM (MISCELLANEOUS) IMPLANT
DRAPE SHEET LG 3/4 BI-LAMINATE (DRAPES) ×6 IMPLANT
GAUZE PACKING 2X5 YD STRL (GAUZE/BANDAGES/DRESSINGS) IMPLANT
GLOVE BIOGEL PI IND STRL 6.5 (GLOVE) ×1 IMPLANT
GLOVE BIOGEL PI IND STRL 7.0 (GLOVE) ×1 IMPLANT
GLOVE BIOGEL PI INDICATOR 6.5 (GLOVE) ×2
GLOVE BIOGEL PI INDICATOR 7.0 (GLOVE) ×2
GLOVE SURG SS PI 6.5 STRL IVOR (GLOVE) ×6 IMPLANT
GOWN STRL REUS W/TWL LRG LVL3 (GOWN DISPOSABLE) ×12 IMPLANT
NDL MAYO CATGUT SZ4 TPR NDL (NEEDLE) ×1 IMPLANT
NDL SPNL 22GX3.5 QUINCKE BK (NEEDLE) ×1 IMPLANT
NEEDLE MAYO CATGUT SZ4 (NEEDLE) ×3 IMPLANT
NEEDLE SPNL 22GX3.5 QUINCKE BK (NEEDLE) ×3 IMPLANT
NS IRRIG 1000ML POUR BTL (IV SOLUTION) ×3 IMPLANT
PACK VAGINAL WOMENS (CUSTOM PROCEDURE TRAY) ×3 IMPLANT
PACKING VAGINAL (PACKING) ×2 IMPLANT
PAD MAGNETIC INST (MISCELLANEOUS) ×3 IMPLANT
PAD OB MATERNITY 4.3X12.25 (PERSONAL CARE ITEMS) ×3 IMPLANT
SUT CHROMIC 2 0 TIES 18 (SUTURE) IMPLANT
SUT VIC AB 0 CT1 18XCR BRD8 (SUTURE) ×3 IMPLANT
SUT VIC AB 0 CT1 8-18 (SUTURE) ×9
SUT VICRYL 0 TIES 12 18 (SUTURE) ×3 IMPLANT
TOWEL OR 17X24 6PK STRL BLUE (TOWEL DISPOSABLE) ×6 IMPLANT
TRAY FOLEY CATH SILVER 14FR (SET/KITS/TRAYS/PACK) ×3 IMPLANT

## 2018-03-25 NOTE — Anesthesia Preprocedure Evaluation (Signed)
Anesthesia Evaluation  Patient identified by MRN, date of birth, ID band Patient awake    Reviewed: Allergy & Precautions, NPO status , Patient's Chart, lab work & pertinent test results  History of Anesthesia Complications Negative for: history of anesthetic complications  Airway Mallampati: II  TM Distance: >3 FB Neck ROM: Full    Dental  (+) Dental Advisory Given   Pulmonary neg pulmonary ROS,    breath sounds clear to auscultation       Cardiovascular hypertension (does not need medication),  Rhythm:Regular Rate:Normal     Neuro/Psych  Headaches, Anxiety Depression    GI/Hepatic Neg liver ROS, GERD  Medicated and Controlled,S/p gastric sleeve   Endo/Other  Morbid obesity  Renal/GU negative Renal ROS     Musculoskeletal   Abdominal (+) + obese,   Peds  Hematology negative hematology ROS (+)   Anesthesia Other Findings   Reproductive/Obstetrics LMP 03/11/18                             Anesthesia Physical Anesthesia Plan  ASA: II  Anesthesia Plan: General   Post-op Pain Management:    Induction: Intravenous  PONV Risk Score and Plan: 4 or greater and Scopolamine patch - Pre-op, Dexamethasone and Ondansetron  Airway Management Planned: Oral ETT  Additional Equipment:   Intra-op Plan:   Post-operative Plan: Extubation in OR  Informed Consent: I have reviewed the patients History and Physical, chart, labs and discussed the procedure including the risks, benefits and alternatives for the proposed anesthesia with the patient or authorized representative who has indicated his/her understanding and acceptance.   Dental advisory given  Plan Discussed with: CRNA and Surgeon  Anesthesia Plan Comments: (Plan routine monitors, GETA)        Anesthesia Quick Evaluation

## 2018-03-25 NOTE — Op Note (Signed)
OPERATIVE REPORT   INDICATIONS:abnormal uterine bleeding and pelvic pain  PRE-OP DIAGNOSIS:Uterine Fibroids, Menorrhagia, Dysmenorrhea, Anemia, Pelvic Pain   POST-OP DIAGNOSIS;Uterine enlargement, Menorrhagia, Dysmenorrhea, Anemia, Pelvic Pain   PROCEDURE:Procedure(s) (LRB): HYSTERECTOMY VAGINAL, RIGHT SALPINGECTOMY (Bilateral)   SURGEON:Babe Anthis P Samaria Anes  ASSIST: Earnstine Regal certified physician assistant  SPECIMENS: Uterus cervix and portion of right fallopian tube  DISPOSITION OF SPECIMEN:Delivered to Histology  EQA:834 mL  COMPLICATIONS: None  PATIENT TO:  PACU - hemodynamically stable.  PROCEDURE DETAILS: The patient was taken to the operating room after appropriate identification and placed on the operating table.  After the attainment of adequate general anesthesia the patient was placed in the lithotomy position.  A timeout was performed.  The perineum and vagina were prepped with multiple layers of Betadine. A Foley catheter was inserted into the bladder and connected to straight drainage.The perineum was draped as a sterile field. A weighted speculum was placed in the posterior vagina and Lahey tenaculae were placed on the anterior and posterior surfaces of the cervix. The cervicovaginal mucosa was injected with a dilute solution of Pitressin.  A paracervical block was achieved with a total of 10 cc of 2% Xylocaine in the 5 and 7:00 positions and a cc of 2% Xylocaine in the cervix at the 3 and 9 o'clock positions.  The cervix was circumscribed. The anterior vaginal mucosa wasbluntly dissected off the anterior cervix and the anterior peritoneum entered. The bladder blade was placed and the bladder elevated. The posterior peritoneum was entered sharply and tagged. Uterosacral ligaments on the right and left side were clamped cut and suture ligated, and the sutures held. The paracervical tissues were then clamped cut and suture ligated. The uterine arteries were clamped cut and  suture ligated. The parametral tissues were clamped, cut, and suture ligated.The uterus was inverted and the upper pedicles clamped cut tied with free tie and suture-ligated. The uterus and cervix were removed from the operative field. The left fallopian tube is status post removal for a ruptured left ectopic pregnancy.  The right fallopian tube appeared normal.  It was grasped with a Babcock clamp and the mesosalpinx clamped with a Kelly clamp.  The tube was then excised and removed from the operative field.  The mesosalpinx was suture ligated with a suture of 0 Vicryl. The McCall culdoplasty sutures were then placed incorporating the uterosacral ligaments on either side, and the intervening peritoneum.  These were held. The vaginal angles were created with the sutures from the  uterosacral ligaments that had been held and placed through the anterior and posterior surfaces of the vagina then tied down. The remainder of the vaginal cuff was closed with figure-of-eight sutures of.  All sutures used were 0 Vicryl.  The McCall culdoplasty sutures were then tied down.  Hemostasis was noted to be adequate.  All instruments were removed from the vagina.  The patient was awakened from general anesthesia and taken to the recovery room in satisfactory condition having tolerated the procedure well the sponge and instrument counts correct.  Eldred Manges, MD 3:41 PM

## 2018-03-25 NOTE — H&P (Signed)
History and Physical Interval Note:   03/25/2018   11:53 AM   Tanya [name]  has presented today for surgery, with the diagnosis of Uterine Fibroids, Menorrhagia, Dysmenorrhea, Anemia, Pelvic Pain  The various methods of treatment have been discussed with the patient and family. After consideration of risks, benefits and other options for treatment, the patient has consented to  Procedure(s): HYSTERECTOMY VAGINAL as a surgical intervention .  I have reviewed the patients' chart and labs.  Questions were answered to the patient's satisfaction.     Eldred Manges  MD

## 2018-03-25 NOTE — Transfer of Care (Signed)
Immediate Anesthesia Transfer of Care Note  Patient: Tanya [name]  Procedure(s) Performed: HYSTERECTOMY VAGINAL, RIGHT SALPINGECTOMY (Bilateral )  Patient Location: PACU  Anesthesia Type:General  Level of Consciousness: sedated and responds to stimulation  Airway & Oxygen Therapy: Patient Spontanous Breathing and Patient connected to nasal cannula oxygen  Post-op Assessment: Report given to RN  Post vital signs: Reviewed and stable  Last Vitals:  Vitals Value Taken Time  BP 136/81 03/25/2018  3:35 PM  Temp    Pulse 97 03/25/2018  3:36 PM  Resp 18 03/25/2018  3:36 PM  SpO2 100 % 03/25/2018  3:36 PM  Vitals shown include unvalidated device data.  Last Pain:  Vitals:   03/25/18 1016  TempSrc:   PainSc: 5       Patients Stated Pain Goal: 2 (21/74/71 5953)  Complications: No apparent anesthesia complications

## 2018-03-25 NOTE — Anesthesia Procedure Notes (Signed)
Procedure Name: Intubation Date/Time: 03/25/2018 12:35 PM Performed by: Bonney Aid, CRNA Pre-anesthesia Checklist: Patient identified, Emergency Drugs available, Suction available and Patient being monitored Patient Re-evaluated:Patient Re-evaluated prior to induction Oxygen Delivery Method: Circle system utilized Preoxygenation: Pre-oxygenation with 100% oxygen Induction Type: IV induction Ventilation: Mask ventilation without difficulty Laryngoscope Size: Mac and 3 Grade View: Grade I Tube type: Oral Number of attempts: 1 Airway Equipment and Method: Stylet Placement Confirmation: ETT inserted through vocal cords under direct vision,  positive ETCO2 and breath sounds checked- equal and bilateral Secured at: 21 cm Tube secured with: Tape Dental Injury: Teeth and Oropharynx as per pre-operative assessment

## 2018-03-25 NOTE — Discharge Instructions (Signed)
Call [facility] @ [phone] if:  You have a temperature greater than or equal to 100.4 degrees Farenheit orally You have pain that is not made better by the pain medication given and taken as directed You have excessive bleeding or problems urinating  Take Colace (Docusate Sodium/Stool Softener) 100 mg 2-3 times daily while taking narcotic pain medicine to avoid constipation or until bowel movements are regular.  You may drive after 2 weeks You may walk up steps  You may shower  You may resume a regular diet   Do not lift over 15 pounds for 6 weeks Avoid anything in vagina for 6 weeks (or until after your post-operative visit)

## 2018-03-25 NOTE — Anesthesia Postprocedure Evaluation (Signed)
Anesthesia Post Note  Patient: Tanya [name]  Procedure(s) Performed: HYSTERECTOMY VAGINAL, RIGHT SALPINGECTOMY (Bilateral )     Patient location during evaluation: PACU Anesthesia Type: General Level of consciousness: awake and alert Pain management: pain level controlled Vital Signs Assessment: post-procedure vital signs reviewed and stable Respiratory status: spontaneous breathing, nonlabored ventilation, respiratory function stable and patient connected to nasal cannula oxygen Cardiovascular status: blood pressure returned to baseline and stable Postop Assessment: no apparent nausea or vomiting Anesthetic complications: no    Last Vitals:  Vitals:   03/25/18 1535 03/25/18 1545  BP: 136/81 127/78  Pulse: (P) 88 87  Resp: 17 17  Temp: 36.9 C   SpO2:  100%    Last Pain:  Vitals:   03/25/18 1600  TempSrc:   PainSc: (P) 8                  Farrell Pantaleo S

## 2018-03-25 NOTE — OR Nursing (Signed)
Specimen weighed 237 grams.  [place] Crane RN

## 2018-03-26 ENCOUNTER — Encounter (HOSPITAL_BASED_OUTPATIENT_CLINIC_OR_DEPARTMENT_OTHER): Payer: Self-pay | Admitting: Obstetrics and Gynecology

## 2018-03-26 DIAGNOSIS — D649 Anemia, unspecified: Secondary | ICD-10-CM | POA: Diagnosis not present

## 2018-03-26 DIAGNOSIS — D259 Leiomyoma of uterus, unspecified: Secondary | ICD-10-CM | POA: Diagnosis not present

## 2018-03-26 DIAGNOSIS — Z79899 Other long term (current) drug therapy: Secondary | ICD-10-CM | POA: Diagnosis not present

## 2018-03-26 DIAGNOSIS — F419 Anxiety disorder, unspecified: Secondary | ICD-10-CM | POA: Diagnosis not present

## 2018-03-26 DIAGNOSIS — N946 Dysmenorrhea, unspecified: Secondary | ICD-10-CM | POA: Diagnosis not present

## 2018-03-26 DIAGNOSIS — N920 Excessive and frequent menstruation with regular cycle: Secondary | ICD-10-CM | POA: Diagnosis not present

## 2018-03-26 LAB — CBC
HEMATOCRIT: 30.8 % — AB (ref 36.0–46.0)
Hemoglobin: 9.9 g/dL — ABNORMAL LOW (ref 12.0–15.0)
MCH: 26.8 pg (ref 26.0–34.0)
MCHC: 32.1 g/dL (ref 30.0–36.0)
MCV: 83.5 fL (ref 78.0–100.0)
PLATELETS: 245 10*3/uL (ref 150–400)
RBC: 3.69 MIL/uL — AB (ref 3.87–5.11)
RDW: 13.6 % (ref 11.5–15.5)
WBC: 8.7 10*3/uL (ref 4.0–10.5)

## 2018-03-26 MED ORDER — BETHANECHOL CHLORIDE 10 MG PO TABS: ORAL_TABLET | ORAL | 0 refills | Status: DC

## 2018-03-26 MED ORDER — OXYCODONE-ACETAMINOPHEN 5-325 MG PO TABS
ORAL_TABLET | ORAL | Status: AC
  Filled 2018-03-26: qty 2

## 2018-03-26 MED ORDER — HYDROCODONE-ACETAMINOPHEN 10-325 MG PO TABS
ORAL_TABLET | ORAL | Status: AC
  Filled 2018-03-26: qty 1

## 2018-03-26 MED ORDER — HYDROMORPHONE HCL 2 MG PO TABS
2.0000 mg | ORAL_TABLET | ORAL | Status: DC | PRN
  Administered 2018-03-26: 2 mg via ORAL
  Filled 2018-03-26: qty 1

## 2018-03-26 MED ORDER — ACETAMINOPHEN 500 MG PO TABS
ORAL_TABLET | ORAL | Status: AC
  Filled 2018-03-26: qty 1

## 2018-03-26 MED ORDER — TRAMADOL HCL 50 MG PO TABS
ORAL_TABLET | ORAL | Status: AC
  Filled 2018-03-26: qty 2

## 2018-03-26 MED ORDER — DOCUSATE SODIUM 100 MG PO CAPS
ORAL_CAPSULE | ORAL | Status: AC
  Filled 2018-03-26: qty 1

## 2018-03-26 MED ORDER — HYDROCODONE-ACETAMINOPHEN 10-325 MG PO TABS: 1.0000 | ORAL_TABLET | ORAL | 0 refills | Status: DC | PRN

## 2018-03-26 MED ORDER — PANTOPRAZOLE SODIUM 40 MG PO TBEC
DELAYED_RELEASE_TABLET | ORAL | Status: AC
  Filled 2018-03-26: qty 1

## 2018-03-26 MED ORDER — DOCUSATE SODIUM 100 MG PO CAPS: 100.0000 mg | ORAL_CAPSULE | Freq: Two times a day (BID) | ORAL | 0 refills | Status: DC

## 2018-03-26 MED ORDER — KETOROLAC TROMETHAMINE 30 MG/ML IJ SOLN
INTRAMUSCULAR | Status: AC
  Filled 2018-03-26: qty 1

## 2018-03-26 MED ORDER — TRAMADOL HCL 50 MG PO TABS: 100.0000 mg | ORAL_TABLET | Freq: Four times a day (QID) | ORAL | 2 refills | Status: DC | PRN

## 2018-03-26 MED ORDER — OXYCODONE-ACETAMINOPHEN 5-325 MG PO TABS
ORAL_TABLET | ORAL | Status: AC
  Filled 2018-03-26: qty 1

## 2018-03-26 MED ORDER — ONDANSETRON HCL 4 MG/2ML IJ SOLN
INTRAMUSCULAR | Status: AC
  Filled 2018-03-26: qty 2

## 2018-03-26 MED ORDER — ACETAMINOPHEN 500 MG PO TABS
500.0000 mg | ORAL_TABLET | ORAL | Status: DC | PRN
  Administered 2018-03-26: 500 mg via ORAL
  Filled 2018-03-26: qty 1

## 2018-03-26 MED ORDER — HYDROMORPHONE HCL 2 MG PO TABS
ORAL_TABLET | ORAL | Status: AC
  Filled 2018-03-26: qty 1

## 2018-03-26 MED ORDER — BETHANECHOL CHLORIDE 10 MG PO TABS
10.0000 mg | ORAL_TABLET | Freq: Four times a day (QID) | ORAL | Status: DC
  Administered 2018-03-26: 10 mg via ORAL
  Filled 2018-03-26 (×3): qty 1

## 2018-03-26 MED ORDER — HYDROCODONE-ACETAMINOPHEN 10-325 MG PO TABS
1.0000 | ORAL_TABLET | ORAL | Status: DC | PRN
  Administered 2018-03-26: 1 via ORAL
  Filled 2018-03-26: qty 1

## 2018-03-26 MED FILL — HYDROCODON-APAP 10-325: 10-325 | 5 days supply | Qty: 30 | Fill #0

## 2018-03-26 MED FILL — BETHANECHOL 10 MG TABLET: 10 | 7 days supply | Qty: 30 | Fill #0

## 2018-03-26 MED FILL — traMADol HCL 50 MG TABS: 50 | 3 days supply | Qty: 30 | Fill #0

## 2018-03-26 NOTE — Progress Notes (Signed)
Patient walked in hallway 2 times without difficulty.Tanya [name]

## 2018-03-26 NOTE — Progress Notes (Signed)
Tanya [name] is a18 y.o.  [id number]  Post Op Date # 1:  TVH/RS  Subjective: Patient is having difficulty with pain control. She complains of a sharp and constant pain located right of her supra-pubic area just above mons border. Patient has Pain is not well controlled.  Medications being used: prescription NSAID's including Ketorolac  and narcotic analgesics including Dilaudid. Has ambulated in the halls, tolerating liquids and preparing to eat a regular breakfast.  Hasn't voided yet.     Objective: Vital signs in last 24 hours: Temp:  [97.9 F (36.6 C)-98.7 F (37.1 C)] 97.9 F (36.6 C) (06/21 0654) Pulse Rate:  [55-92] 61 (06/21 0654) Resp:  [14-20] 16 (06/21 0654) BP: (98-136)/(53-81) 103/61 (06/21 0654) SpO2:  [93 %-100 %] 99 % (06/21 0654) Weight:  [169 lb 6.4 oz (76.8 kg)] 169 lb 6.4 oz (76.8 kg) (06/20 0950)  Intake/Output from previous day: 06/20 0701 - 06/21 0700 In: 3700 [P.O.:50; I.V.:3550] Out: 2125 [Urine:1825] Intake/Output this shift: No intake/output data recorded. Recent Labs  Lab 03/19/18 0954 03/22/18 0917 03/26/18 0539  WBC 4.7 5.1 8.7  HGB 11.6* 11.5* 9.9*  HCT 35.8* 36.4 30.8*  PLT 285 262 245     Recent Labs  Lab 03/19/18 0954  NA 142  K 3.9  CL 110  CO2 27  BUN 12  CREATININE 0.63  CALCIUM 8.5*  PROT 5.9*  BILITOT 0.4  ALKPHOS 30*  ALT 20  AST 17  GLUCOSE 85    EXAM: General: alert, cooperative and moderate distress Resp: clear to auscultation bilaterally Cardio: regular rate and rhythm, S1, S2 normal, no murmur, click, rub or gallop GI: Decreased bowel sounds but they are present in all 4 quadrants; soft with tenderness and guarding in right lower quadrant. No rebound. Extremities: Homans sign is negative, no sign of DVT and no calf tenderness. Vaginal Bleeding: none   Assessment: s/p Procedure(s): HYSTERECTOMY VAGINAL, RIGHT SALPINGECTOMY: stable, anemia and poor pain control.  Plan: Advance diet Encourage  ambulation Routine care, awaiting patient to void and consultation with Dr. Leo Grosser for pain management plan.   LOS: 0 days    Earnstine Regal, PA-C 03/26/2018 8:13 AM

## 2018-03-26 NOTE — Progress Notes (Signed)
Urinary catheter removed ~0605 this morning. Taking po fluids without difficulty. IV saline locked at 0945. Ambulated around unit and from bed to bathroom in attempts to urinate.  Pt still unable to void. Bladder scan revealed 308 ml. Pain still 8/10, unchanged despite po Dilaudid  ~7am and IV toradol at 0852. Notified Dr Leo Grosser. Orders received: in/out cath now, encourage po fluids, saline lock IV (already complete), and give 2 percocet now per existing prn order. Continue to monitor for void and pain relief, and update Dr Leo Grosser of pt's status via telephone.  Blair Hailey, RN

## 2018-03-26 NOTE — Progress Notes (Signed)
Per report, urinary catheter dc'd at 1900. As of midnight, pt unable to void. Pt reports abd pain and pressure. Bladder scan revealed 530 ml. Notified Dr Leo Grosser and received order to reinsert foley catheter. Pt tolerated catheter reinsertion well with immediate return of clear yellow urine and small blood clot sediment, 525 ml drained. Continue to follow.  Blair Hailey, RN

## 2018-03-26 NOTE — Discharge Summary (Signed)
POD#1  Subjective.:  Pt has had difficulty with management of right lower quadrant pain since surgery and has not gotten satisfactory relief from tramadol, oxycodone or dilaudid.  She denies nausea, vomiting constipation or diarrhea.  She voided after urecholine 10 mg, but had no pain with urination, and has voided spontaneously since then.  She is tolerating po's in small amounts which is her usual habit since her bariatric surgery.  Objective: Temp:  [97.9 F (36.6 C)-98.7 F (37.1 C)] 98.3 F (36.8 C) (06/21 1445) Pulse Rate:  [55-92] 55 (06/21 1445) Resp:  [14-20] 16 (06/21 1445) BP: (94-136)/(53-81) 94/59 (06/21 1445) SpO2:  [93 %-100 %] 98 % (06/21 1100)   Lungs: clear Heart:  Regular rate and rhythm Abdomen:  Soft without masses or organomegaly.  No rebound tenderness.  Bowel sounds normal. Vaginal:  Minimal bleeding U/o:  adequate  Recent Results (from the past 2160 hour(s))  Lipase, blood     Status: None   Collection Time: 03/19/18  9:54 AM  Result Value Ref Range   Lipase 43 11 - 51 U/L    Comment: Performed at [facility], [place] [address] [place], [place] [address]  Comprehensive metabolic panel     Status: Abnormal   Collection Time: 03/19/18  9:54 AM  Result Value Ref Range   Sodium 142 135 - 145 mmol/L   Potassium 3.9 3.5 - 5.1 mmol/L   Chloride 110 101 - 111 mmol/L   CO2 27 22 - 32 mmol/L   Glucose, Bld 85 65 - 99 mg/dL   BUN 12 6 - 20 mg/dL   Creatinine, Ser 0.63 0.44 - 1.00 mg/dL   Calcium 8.5 (L) 8.9 - 10.3 mg/dL   Total Protein 5.9 (L) 6.5 - 8.1 g/dL   Albumin 3.5 3.5 - 5.0 g/dL   AST 17 15 - 41 U/L   ALT 20 14 - 54 U/L   Alkaline Phosphatase 30 (L) 38 - 126 U/L   Total Bilirubin 0.4 0.3 - 1.2 mg/dL   GFR calc non Af Amer >60 >60 mL/min   GFR calc Af Amer >60 >60 mL/min    Comment: (NOTE) The eGFR has been calculated using the CKD EPI equation. This calculation has not been validated in all clinical situations. eGFR's  persistently <60 mL/min signify possible Chronic Kidney Disease.    Anion gap 5 5 - 15    Comment: Performed at [facility], [place] [address], [place], [place] [address]  CBC     Status: Abnormal   Collection Time: 03/19/18  9:54 AM  Result Value Ref Range   WBC 4.7 4.0 - 10.5 K/uL   RBC 4.25 3.87 - 5.11 MIL/uL   Hemoglobin 11.6 (L) 12.0 - 15.0 g/dL   HCT 35.8 (L) 36.0 - 46.0 %   MCV 84.2 78.0 - 100.0 fL   MCH 27.3 26.0 - 34.0 pg   MCHC 32.4 30.0 - 36.0 g/dL   RDW 13.4 11.5 - 15.5 %   Platelets 285 150 - 400 K/uL    Comment: Performed at [facility], [place] [address], [place],  [address]  Urinalysis, Routine w reflex microscopic     Status: Abnormal   Collection Time: 03/19/18  9:54 AM  Result Value Ref Range   Color, Urine YELLOW YELLOW   APPearance CLOUDY (A) CLEAR   Specific Gravity, Urine 1.016 1.005 - 1.030   pH 8.0 5.0 - 8.0   Glucose, UA NEGATIVE NEGATIVE mg/dL  Hgb urine dipstick NEGATIVE NEGATIVE   Bilirubin Urine NEGATIVE NEGATIVE   Ketones, ur NEGATIVE NEGATIVE mg/dL   Protein, ur NEGATIVE NEGATIVE mg/dL   Nitrite NEGATIVE NEGATIVE   Leukocytes, UA NEGATIVE NEGATIVE    Comment: Performed at [facility], [place] [address], [place], [place] [address]  I-Stat beta hCG blood, ED     Status: None   Collection Time: 03/19/18 10:00 AM  Result Value Ref Range   I-stat hCG, quantitative <5.0 <5 mIU/mL   Comment 3            Comment:   GEST. AGE      CONC.  (mIU/mL)   <=1 WEEK        5 - 50     2 WEEKS       50 - 500     3 WEEKS       100 - 10,000     4 WEEKS     1,000 - 30,000        FEMALE AND NON-PREGNANT FEMALE:     LESS THAN 5 mIU/mL   Pregnancy, urine     Status: None   Collection Time: 03/22/18  8:21 AM  Result Value Ref Range   Preg Test, Ur NEGATIVE NEGATIVE    Comment:        THE SENSITIVITY OF THIS METHODOLOGY IS >20 mIU/mL. Performed at [facility], [place] [address]  [place], [place] [address]   CBC     Status: Abnormal   Collection Time: 03/22/18  9:17 AM  Result Value Ref Range   WBC 5.1 4.0 - 10.5 K/uL   RBC 4.33 3.87 - 5.11 MIL/uL   Hemoglobin 11.5 (L) 12.0 - 15.0 g/dL   HCT 36.4 36.0 - 46.0 %   MCV 84.1 78.0 - 100.0 fL   MCH 26.6 26.0 - 34.0 pg   MCHC 31.6 30.0 - 36.0 g/dL   RDW 13.5 11.5 - 15.5 %   Platelets 262 150 - 400 K/uL    Comment: Performed at [facility], [place] [address] [place], [place] [address]  Type and screen     Status: None   Collection Time: 03/22/18  9:17 AM  Result Value Ref Range   ABO/RH(D) A POS    Antibody Screen NEG    Sample Expiration 03/28/2018    Extend sample reason      NO TRANSFUSIONS OR PREGNANCY IN THE PAST 3 MONTHS Performed at [facility], [place] [address], [place], [place] [address]   ABO/Rh     Status: None   Collection Time: 03/22/18  9:17 AM  Result Value Ref Range   ABO/RH(D)      A POS Performed at [facility], [place] [address], [place], [place] [address]   CBC     Status: Abnormal   Collection Time: 03/26/18  5:39 AM  Result Value Ref Range   WBC 8.7 4.0 - 10.5 K/uL   RBC 3.69 (L) 3.87 - 5.11 MIL/uL   Hemoglobin 9.9 (L) 12.0 - 15.0 g/dL   HCT 30.8 (L) 36.0 - 46.0 %   MCV 83.5 78.0 - 100.0 fL   MCH 26.8 26.0 - 34.0 pg   MCHC 32.1 30.0 - 36.0 g/dL   RDW 13.6 11.5 - 15.5 %   Platelets 245 150 - 400 K/uL    Comment: Performed at [facility], [place] [address], [place], [place] [address]    Assessment:  Pt has pain consistent with her day 1 post op course and is without other signs or symptoms.    Plan:  Discharge home with hydrocodone which has worked best of analgesics. Printed discharge instructions are given.

## 2018-03-26 NOTE — Progress Notes (Signed)
Dr. Biagio Quint in to see patient and change pain medication

## 2018-04-19 ENCOUNTER — Other Ambulatory Visit: Payer: Self-pay

## 2018-04-19 ENCOUNTER — Ambulatory Visit: Payer: 59 | Admitting: Family Medicine

## 2018-04-19 ENCOUNTER — Encounter: Payer: Self-pay | Admitting: Family Medicine

## 2018-04-19 VITALS — BP 102/70 | HR 92 | Temp 98.0°F | Resp 16 | Ht 62.6 in | Wt 160.0 lb

## 2018-04-19 DIAGNOSIS — Z9884 Bariatric surgery status: Secondary | ICD-10-CM | POA: Diagnosis not present

## 2018-04-19 DIAGNOSIS — F411 Generalized anxiety disorder: Secondary | ICD-10-CM

## 2018-04-19 DIAGNOSIS — D500 Iron deficiency anemia secondary to blood loss (chronic): Secondary | ICD-10-CM | POA: Diagnosis not present

## 2018-04-19 DIAGNOSIS — D518 Other vitamin B12 deficiency anemias: Secondary | ICD-10-CM

## 2018-04-19 DIAGNOSIS — G43709 Chronic migraine without aura, not intractable, without status migrainosus: Secondary | ICD-10-CM | POA: Diagnosis not present

## 2018-04-19 DIAGNOSIS — Z90710 Acquired absence of both cervix and uterus: Secondary | ICD-10-CM | POA: Diagnosis not present

## 2018-04-19 DIAGNOSIS — N921 Excessive and frequent menstruation with irregular cycle: Secondary | ICD-10-CM

## 2018-04-19 MED ORDER — ALPRAZOLAM 0.5 MG PO TABS: 0.5000 mg | ORAL_TABLET | Freq: Every day | ORAL | 2 refills | Status: DC | PRN

## 2018-04-19 MED ORDER — FLUOXETINE HCL 20 MG PO TABS
60.0000 mg | ORAL_TABLET | Freq: Every day | ORAL | 1 refills | Status: DC
Start: 2018-04-19 — End: 2019-02-10

## 2018-04-19 MED FILL — ALPRAZolam 0.5 MG TABS: 0.5 | 30 days supply | Qty: 30 | Fill #0

## 2018-04-19 NOTE — Progress Notes (Signed)
Subjective:    Patient ID: Tanya [name], female    DOB: 14-Sep-1981, 37 y.o.   MRN: [id number]  04/19/2018  Anxiety (3 month follow-up )    HPI This 37 y.o. female presents for six week follow-up anxiety.   S/p hysterectomy. anxiety is better. Not sleeping at night.  Waking up every hour.   Bedtime 900; wakes up 600.  No caffeine in evenings; coffee every morning.  No soda or tea.  Only water.  Was having horrible abdominal pain when ate; since hyserectomy, no further abdominal pain.   No migraines. After surgery, having low blood pressure; decreased to 76/40.   Normal bowel movements with Miralax once daily and colace. Excessive worry persists; improved; son is home now which is better.  Son is back; school did not help; trying to get son a job. Husband and son butt heads.   Taking Xanax at bedtime; taking with Melatonin.  Still not sleeping.  Husband is also restless.   Taking Prozac at night because of nausea.   GAD 7 : Generalized Anxiety Score 04/19/2018 01/13/2018  Nervous, Anxious, on Edge 1 1  Control/stop worrying 0 3  Worry too much - different things 1 3  Trouble relaxing 1 3  Restless 2 3  Easily annoyed or irritable 3 3  Afraid - awful might happen 1 3  Total GAD 7 Score 9 19  Anxiety Difficulty Somewhat difficult -    BP Readings from Last 3 Encounters:  04/19/18 102/70  03/26/18 106/72  03/22/18 119/76   Wt Readings from Last 3 Encounters:  04/19/18 160 lb (72.6 kg)  03/25/18 169 lb 6.4 oz (76.8 kg)  03/22/18 177 lb (80.3 kg)   Immunization History  Administered Date(s) Administered  . Influenza,inj,Quad PF,6+ Mos 07/15/2017  . Influenza-Unspecified 07/06/2016    Review of Systems  Constitutional: Negative for activity change, appetite change, chills, diaphoresis, fatigue, fever and unexpected weight change.  HENT: Negative for congestion, dental problem, drooling, ear discharge, ear pain, facial swelling, hearing loss, mouth sores,  nosebleeds, postnasal drip, rhinorrhea, sinus pressure, sneezing, sore throat, tinnitus, trouble swallowing and voice change.   Eyes: Negative for photophobia, pain, discharge, redness, itching and visual disturbance.  Respiratory: Negative for apnea, cough, choking, chest tightness, shortness of breath, wheezing and stridor.   Cardiovascular: Negative for chest pain, palpitations and leg swelling.  Gastrointestinal: Negative for abdominal distention, abdominal pain, anal bleeding, blood in stool, constipation, diarrhea, nausea, rectal pain and vomiting.  Endocrine: Negative for cold intolerance, heat intolerance, polydipsia, polyphagia and polyuria.  Genitourinary: Negative for decreased urine volume, difficulty urinating, dyspareunia, dysuria, enuresis, flank pain, frequency, genital sores, hematuria, menstrual problem, pelvic pain, urgency, vaginal bleeding, vaginal discharge and vaginal pain.  Musculoskeletal: Negative for arthralgias, back pain, gait problem, joint swelling, myalgias, neck pain and neck stiffness.  Skin: Negative for color change, pallor, rash and wound.  Allergic/Immunologic: Negative for environmental allergies, food allergies and immunocompromised state.  Neurological: Negative for dizziness, tremors, seizures, syncope, facial asymmetry, speech difficulty, weakness, light-headedness, numbness and headaches.  Hematological: Negative for adenopathy. Does not bruise/bleed easily.  Psychiatric/Behavioral: Positive for sleep disturbance. Negative for agitation, behavioral problems, confusion, decreased concentration, dysphoric mood, hallucinations, self-injury and suicidal ideas. The patient is nervous/anxious. The patient is not hyperactive.     Past Medical History:  Diagnosis Date  . Anemia   . Anxiety   . Arthritis    L knee OA  . Depression    history of depression in 2012 with  death of mother  . Hyperlipidemia   . Hypertension   . Migraine   . Obesity 02/2017    has had gastric sleeve   Past Surgical History:  Procedure Laterality Date  . ABDOMINAL HYSTERECTOMY    . ANAL RECTAL MANOMETRY N/A 01/11/2018   Procedure: ANO RECTAL MANOMETRY;  Surgeon: Mauri Pole, MD;  Location: [facility];  Service: Endoscopy;  Laterality: N/A;  . CARPAL TUNNEL RELEASE Bilateral   . CESAREAN SECTION     x 1  . CHOLECYSTECTOMY    . KNEE SURGERY Left   . LAPAROSCOPIC GASTRIC SLEEVE RESECTION N/A 02/17/2017   Procedure: LAPAROSCOPIC GASTRIC SLEEVE RESECTION, UPPER ENDO;  Surgeon: Johnathan Hausen, MD;  Location: [facility];  Service: General;  Laterality: N/A;  . LAPAROSCOPIC UNILATERAL SALPINGECTOMY Left 06/10/2014   Procedure: LAPAROSCOPIC Left SALPINGECTOMY, removal of ectopic pregnancy;  Surgeon: Guss Bunde, MD;  Location: [place] [facility];  Service: Gynecology;  Laterality: Left;  . TUBAL LIGATION    . VAGINAL HYSTERECTOMY Bilateral 03/25/2018   Procedure: HYSTERECTOMY VAGINAL, RIGHT SALPINGECTOMY;  Surgeon: Eldred Manges, MD;  Location: [facility];  Service: Gynecology;  Laterality: Bilateral;  ERAS  . WISDOM TOOTH EXTRACTION     Allergies  Allergen Reactions  . Imitrex [Sumatriptan] Shortness Of Breath  . Zomig [Zolmitriptan] Shortness Of Breath  . Latex Hives and Swelling  . Ibuprofen Other (See Comments)    Can not take because of surgery (No NSAIDS)  . Sulfa Antibiotics Other (See Comments)    Unknown. Since childhood.    Current Outpatient Medications on File Prior to Visit  Medication Sig Dispense Refill  . Biotin 10000 MCG TABS Take 1 tablet by mouth daily.    Marland Kitchen CALCIUM PO Take 1 tablet 3 (three) times daily by mouth.    . docusate sodium (COLACE) 100 MG capsule Take 1 capsule (100 mg total) by mouth 2 (two) times daily. 10 capsule 0  . Melatonin 5 MG TABS Take 5 mg by mouth at bedtime.    . Multiple Vitamins-Minerals (BARIATRIC MULTIVITAMINS/IRON) CAPS Take 1 tablet by mouth 2 (two) times daily.    . pantoprazole (PROTONIX) 40 MG  tablet Take 40 mg by mouth daily.    Marland Kitchen topiramate (TOPAMAX) 100 MG tablet TAKE 2 TABLETS BY MOUTH TWICE DAILY 360 tablet 1  . traMADol (ULTRAM) 50 MG tablet Take 2 tablets (100 mg total) by mouth every 6 (six) hours as needed for moderate pain. 30 tablet 2  . vitamin B-12 1000 MCG tablet Take 1 tablet (1,000 mcg total) by mouth daily. 30 tablet 1   Current Facility-Administered Medications on File Prior to Visit  Medication Dose Route Frequency Provider Last Rate Last Dose  . cyanocobalamin ((VITAMIN B-12)) injection 1,000 mcg  1,000 mcg Intramuscular Q30 days Wardell Honour, MD   1,000 mcg at 01/02/17 0940   Social History   Socioeconomic History  . Marital status: Married    Spouse name: Jeneen Rinks  . Number of children: 2  . Years of education: Not on file  . Highest education level: Not on file  Occupational History  . Occupation: Research scientist (life sciences): [employer]  . Financial resource strain: Not on file  . Food insecurity:    Worry: Not on file    Inability: Not on file  . Transportation needs:    Medical: Not on file    Non-medical: Not on file  Tobacco Use  . Smoking  status: Never Smoker  . Smokeless tobacco: Never Used  Substance and Sexual Activity  . Alcohol use: Yes    Comment: socially  . Drug use: No  . Sexual activity: Yes    Birth control/protection: Surgical    Comment: tubal ligation 2002  Lifestyle  . Physical activity:    Days per week: Not on file    Minutes per session: Not on file  . Stress: Not on file  Relationships  . Social connections:    Talks on phone: Not on file    Gets together: Not on file    Attends religious service: Not on file    Active member of club or organization: Not on file    Attends meetings of clubs or organizations: Not on file    Relationship status: Not on file  . Intimate partner violence:    Fear of current or ex partner: Not on file    Emotionally abused: Not on file    Physically  abused: Not on file    Forced sexual activity: Not on file  Other Topics Concern  . Not on file  Social History Narrative   Marital status: married x 6 years; second marriage      Children: 2 children (20, 1); 1 stepgrandaughter; 1 stepdaughter      Lives: with husband, one son      Employment: Chartered certified accountant since 2017; fifth floor surgical floor; third shift 3 twelve hour shifts      Tobacco: none      Alcohol: none      Drugs; none      Exercise: none   Family History  Problem Relation Age of Onset  . Breast cancer Mother 14  . Cancer Mother   . Prostate cancer Father 44  . Colon polyps Father   . Breast cancer Maternal Aunt        dx in her 76s; BRCA neg  . Prostate cancer Paternal Uncle   . Diabetes Paternal Uncle   . Heart disease Maternal Grandmother   . Diabetes Paternal Grandmother   . Prostate cancer Paternal Grandfather   . Ovarian cancer Sister 102       found during her pregnancy  . Prostate cancer Paternal Uncle   . Prostate cancer Paternal Uncle   . Cancer Other   . Stroke Other   . Diabetes Other   . Coronary artery disease Other   . Colon cancer Neg Hx   . Esophageal cancer Neg Hx   . Rectal cancer Neg Hx        Objective:    BP 102/70   Pulse 92   Temp 98 F (36.7 C) (Oral)   Resp 16   Ht 5' 2.6" (1.59 m)   Wt 160 lb (72.6 kg)   LMP 03/12/2018 (Exact Date)   SpO2 100%   BMI 28.71 kg/m  Physical Exam  Constitutional: She is oriented to person, place, and time. She appears well-developed and well-nourished. No distress.  HENT:  Head: Normocephalic and atraumatic.  Right Ear: External ear normal.  Left Ear: External ear normal.  Nose: Nose normal.  Mouth/Throat: Oropharynx is clear and moist.  Eyes: Pupils are equal, round, and reactive to light. Conjunctivae and EOM are normal.  Neck: Normal range of motion. Neck supple. Carotid bruit is not present. No thyromegaly present.  Cardiovascular: Normal rate, regular rhythm, normal heart sounds  and intact distal pulses. Exam reveals no gallop and no friction rub.  No murmur  heard. Pulmonary/Chest: Effort normal and breath sounds normal. She has no wheezes. She has no rales.  Abdominal: Soft. Bowel sounds are normal. She exhibits no distension and no mass. There is no tenderness. There is no rebound and no guarding.  Lymphadenopathy:    She has no cervical adenopathy.  Neurological: She is alert and oriented to person, place, and time. She displays normal reflexes. No cranial nerve deficit or sensory deficit. She exhibits normal muscle tone. Coordination normal.  Skin: Skin is warm and dry. No rash noted. She is not diaphoretic. No erythema. No pallor.  Psychiatric: She has a normal mood and affect. Her behavior is normal. Judgment and thought content normal.   No results found. Depression screen [facility] 2/9 04/19/2018 03/08/2018 01/13/2018 11/19/2017 08/08/2017  Decreased Interest 0 0 0 0 0  Down, Depressed, Hopeless 0 0 0 0 0  PHQ - 2 Score 0 0 0 0 0   Fall Risk  04/19/2018 03/08/2018 01/13/2018 11/19/2017 08/08/2017  Falls in the past year? No No No No No        Assessment & Plan:   1. Generalized anxiety disorder   2. Chronic migraine without aura without status migrainosus, not intractable   3. Other vitamin B12 deficiency anemia   4. Iron deficiency anemia due to chronic blood loss   5. S/P laparoscopic sleeve gastrectomy May 2018   6. Menometrorrhagia   7. S/P hysterectomy     Generalized anxiety disorder: Improved with increase in Prozac to 40 mg daily.  Now suffering with insomnia since switching Prozac to bedtime.  Recommend taking Prozac at 9 AM with breakfast.  Continue Xanax at bedtime as needed.  Increase Prozac to 60 mg daily for improved anxiety control.  Menometrorrhagia status post hysterectomy in June: Doing well and postoperative pe will return to work in 2 weeks.riod.  Vitamin B12 deficiency and iron deficiency anemia: Status post laparoscopic sleeve gastrectomy in  May 2018: Obtain labs for chronic disease management.  Congratulations on weight loss.  Orders Placed This Encounter  Procedures  . CBC with Differential/Platelet  . TSH  . Vitamin B12  . Iron  . VITAMIN D 25 Hydroxy (Vit-D Deficiency, Fractures)   Meds ordered this encounter  Medications  . FLUoxetine (PROZAC) 20 MG tablet    Sig: Take 3 tablets (60 mg total) by mouth daily.    Dispense:  270 tablet    Refill:  1  . ALPRAZolam (XANAX) 0.5 MG tablet    Sig: Take 1 tablet (0.5 mg total) by mouth daily as needed for anxiety.    Dispense:  30 tablet    Refill:  2    Return in about 3 months (around 07/20/2018) for follow-up chronic medical conditions WHITNEY MCVEY, PA.   Norwood Levo, M.D. Primary Care at [facility] previously [facility] [place] [address] [place], [place]  [address] [phone] phone [phone] fax

## 2018-04-19 NOTE — Patient Instructions (Addendum)
  Switch Prozac to 9:00am with breakfast.     IF you received an x-ray today, you will receive an invoice from [facility]. Please contact [facility] at [phone] with questions or concerns regarding your invoice.   IF you received labwork today, you will receive an invoice from [facility]. Please contact [facility] at [phone] with questions or concerns regarding your invoice.   Our billing staff will not be able to assist you with questions regarding bills from these companies.  You will be contacted with the lab results as soon as they are available. The fastest way to get your results is to activate your My Chart account. Instructions are located on the last page of this paperwork. If you have not heard from [place] regarding the results in 2 weeks, please contact this office.

## 2018-04-20 ENCOUNTER — Encounter: Payer: Self-pay | Admitting: Family Medicine

## 2018-04-20 LAB — CBC WITH DIFFERENTIAL/PLATELET
BASOS ABS: 0.1 10*3/uL (ref 0.0–0.2)
Basos: 1 %
EOS (ABSOLUTE): 0.1 10*3/uL (ref 0.0–0.4)
Eos: 2 %
HEMOGLOBIN: 12.5 g/dL (ref 11.1–15.9)
Hematocrit: 38.9 % (ref 34.0–46.6)
IMMATURE GRANULOCYTES: 0 %
Immature Grans (Abs): 0 10*3/uL (ref 0.0–0.1)
LYMPHS ABS: 2.1 10*3/uL (ref 0.7–3.1)
Lymphs: 41 %
MCH: 26.2 pg — ABNORMAL LOW (ref 26.6–33.0)
MCHC: 32.1 g/dL (ref 31.5–35.7)
MCV: 81 fL (ref 79–97)
MONOCYTES: 9 %
MONOS ABS: 0.5 10*3/uL (ref 0.1–0.9)
NEUTROS PCT: 47 %
Neutrophils Absolute: 2.4 10*3/uL (ref 1.4–7.0)
Platelets: 365 10*3/uL (ref 150–450)
RBC: 4.78 x10E6/uL (ref 3.77–5.28)
RDW: 14.3 % (ref 12.3–15.4)
WBC: 5.1 10*3/uL (ref 3.4–10.8)

## 2018-04-20 LAB — VITAMIN D 25 HYDROXY (VIT D DEFICIENCY, FRACTURES): Vit D, 25-Hydroxy: 44.1 ng/mL (ref 30.0–100.0)

## 2018-04-20 LAB — IRON: Iron: 125 ug/dL (ref 27–159)

## 2018-04-20 LAB — VITAMIN B12: VITAMIN B 12: 1030 pg/mL (ref 232–1245)

## 2018-04-20 LAB — TSH: TSH: 1.88 u[IU]/mL (ref 0.450–4.500)

## 2018-05-24 MED FILL — ALPRAZolam 0.5 MG TABS: 0.5 | 30 days supply | Qty: 30 | Fill #1

## 2018-06-10 ENCOUNTER — Emergency Department (HOSPITAL_COMMUNITY)
Admission: EM | Admit: 2018-06-10 | Discharge: 2018-06-10 | Disposition: A | Discharge status: Home / Self Care | Payer: 59 | Attending: Emergency Medicine | Admitting: Emergency Medicine

## 2018-06-10 ENCOUNTER — Encounter (HOSPITAL_COMMUNITY): Payer: Self-pay

## 2018-06-10 DIAGNOSIS — W228XXA Striking against or struck by other objects, initial encounter: Secondary | ICD-10-CM | POA: Diagnosis not present

## 2018-06-10 DIAGNOSIS — I10 Essential (primary) hypertension: Secondary | ICD-10-CM | POA: Diagnosis not present

## 2018-06-10 DIAGNOSIS — Y92002 Bathroom of unspecified non-institutional (private) residence single-family (private) house as the place of occurrence of the external cause: Secondary | ICD-10-CM | POA: Diagnosis not present

## 2018-06-10 DIAGNOSIS — S00211A Abrasion of right eyelid and periocular area, initial encounter: Secondary | ICD-10-CM | POA: Diagnosis not present

## 2018-06-10 DIAGNOSIS — Y939 Activity, unspecified: Secondary | ICD-10-CM | POA: Diagnosis not present

## 2018-06-10 DIAGNOSIS — Y999 Unspecified external cause status: Secondary | ICD-10-CM | POA: Diagnosis not present

## 2018-06-10 DIAGNOSIS — G43909 Migraine, unspecified, not intractable, without status migrainosus: Secondary | ICD-10-CM | POA: Diagnosis not present

## 2018-06-10 DIAGNOSIS — Z91040 Latex allergy status: Secondary | ICD-10-CM | POA: Diagnosis not present

## 2018-06-10 DIAGNOSIS — Z79899 Other long term (current) drug therapy: Secondary | ICD-10-CM | POA: Diagnosis not present

## 2018-06-10 DIAGNOSIS — S0993XA Unspecified injury of face, initial encounter: Secondary | ICD-10-CM | POA: Diagnosis present

## 2018-06-10 LAB — I-STAT CHEM 8, ED
BUN: 13 mg/dL (ref 6–20)
CREATININE: 0.8 mg/dL (ref 0.44–1.00)
Calcium, Ion: 1.24 mmol/L (ref 1.15–1.40)
Chloride: 104 mmol/L (ref 98–111)
Glucose, Bld: 82 mg/dL (ref 70–99)
HEMATOCRIT: 37 % (ref 36.0–46.0)
HEMOGLOBIN: 12.6 g/dL (ref 12.0–15.0)
POTASSIUM: 3.6 mmol/L (ref 3.5–5.1)
SODIUM: 142 mmol/L (ref 135–145)
TCO2: 25 mmol/L (ref 22–32)

## 2018-06-10 MED ORDER — PROCHLORPERAZINE EDISYLATE 10 MG/2ML IJ SOLN
10.0000 mg | Freq: Once | INTRAMUSCULAR | Status: AC
  Administered 2018-06-10: 10 mg via INTRAVENOUS
  Filled 2018-06-10: qty 2

## 2018-06-10 MED ORDER — DEXAMETHASONE SODIUM PHOSPHATE 10 MG/ML IJ SOLN
10.0000 mg | Freq: Once | INTRAMUSCULAR | Status: AC
  Administered 2018-06-10: 10 mg via INTRAVENOUS
  Filled 2018-06-10: qty 1

## 2018-06-10 MED ORDER — SODIUM CHLORIDE 0.9 % IV BOLUS
1000.0000 mL | Freq: Once | INTRAVENOUS | Status: AC
  Administered 2018-06-10: 1000 mL via INTRAVENOUS

## 2018-06-10 MED ORDER — DIPHENHYDRAMINE HCL 50 MG/ML IJ SOLN
25.0000 mg | Freq: Once | INTRAMUSCULAR | Status: AC
  Administered 2018-06-10: 25 mg via INTRAVENOUS
  Filled 2018-06-10: qty 1

## 2018-06-10 NOTE — ED Provider Notes (Signed)
[place] DEPT Provider Note   CSN: [id number] Arrival date & time: 06/10/18  0013     History   Chief Complaint Chief Complaint  Patient presents with  . Migraine    HPI Tanya [name] is a 37 y.o. female.  37 year old female with a history of migraine headaches, depression, hypertension, dyslipidemia presents to the emergency department for evaluation of migraine headache.  Symptoms have been present over the last 5 days.  She has been lying in bed for most of the day due to debilitating nature of symptoms.  Reports the pain to be sharp, above her bilateral eyes.  She has some nausea as well as photophobia.  Has a history of syncope brought on by worsening pain.  Notes that she had a syncopal event earlier today causing an abrasion to her right eyebrow from striking the tub.  She has been consistently using her Topamax twice daily.  Reports no relief from these medications.  No associated fevers or recent head injury.  Denies extremity numbness, tingling, weakness.  Also no complete vision loss, hearing changes.  Has not seen her neurologist in "years".     Past Medical History:  Diagnosis Date  . Anemia   . Anxiety   . Arthritis    L knee OA  . Depression    history of depression in 12/28/2010 with death of mother  . Hyperlipidemia   . Hypertension   . Migraine   . Obesity 02/2017   has had gastric sleeve    Patient Active Problem List   Diagnosis Date Noted  . Menorrhagia 03/25/2018  . Nausea without vomiting 03/09/2018  . LUQ abdominal pain 03/09/2018  . Bowel habit changes 12/08/2017  . Bloating 12/08/2017  . Abdominal pain 09/04/2017  . Anal fissure 09/04/2017  . Constipation 09/04/2017  . S/P laparoscopic sleeve gastrectomy May 2018 02/17/2017  . Iron deficiency anemia due to chronic blood loss 02/01/2017  . SVT (supraventricular tachycardia) ([place])   . Anemia due to vitamin B12 deficiency 12/08/2016  . Menometrorrhagia  12/05/2016  . Morbid obesity ([place]) 12/05/2016  . Chronic migraine without aura without status migrainosus, not intractable 12/05/2016    Past Surgical History:  Procedure Laterality Date  . ABDOMINAL HYSTERECTOMY    . ANAL RECTAL MANOMETRY N/A 01/11/2018   Procedure: ANO RECTAL MANOMETRY;  Surgeon: Mauri Pole, MD;  [facility];  Service: Endoscopy;  Laterality: N/A;  . CARPAL TUNNEL RELEASE Bilateral   . CESAREAN SECTION     x 1  . CHOLECYSTECTOMY    . KNEE SURGERY Left   . LAPAROSCOPIC GASTRIC SLEEVE RESECTION N/A 02/17/2017   Procedure: LAPAROSCOPIC GASTRIC SLEEVE RESECTION, UPPER ENDO;  Surgeon: Johnathan Hausen, MD;  Location: [facility];  Service: General;  Laterality: N/A;  . LAPAROSCOPIC UNILATERAL SALPINGECTOMY Left 06/10/2014   Procedure: LAPAROSCOPIC Left SALPINGECTOMY, removal of ectopic pregnancy;  Surgeon: Guss Bunde, MD;  Location: [place];  Service: Gynecology;  Laterality: Left;  . TUBAL LIGATION    . VAGINAL HYSTERECTOMY Bilateral 03/25/2018   Procedure: HYSTERECTOMY VAGINAL, RIGHT SALPINGECTOMY;  Surgeon: Eldred Manges, MD;  Location: [place];  Service: Gynecology;  Laterality: Bilateral;  ERAS  . WISDOM TOOTH EXTRACTION       OB History    Gravida  3   Para  2   Term  1   Preterm      AB      Living  2     SAB  TAB      Ectopic      Multiple      Live Births  2            Home Medications    Prior to Admission medications   Medication Sig Start Date End Date Taking? Authorizing Provider  ALPRAZolam Duanne Moron) 0.5 MG tablet Take 1 tablet (0.5 mg total) by mouth daily as needed for anxiety. 04/19/18  Yes Wardell Honour, MD  CALCIUM PO Take 1 tablet 3 (three) times daily by mouth.   Yes [provider]  FLUoxetine (PROZAC) 20 MG tablet Take 3 tablets (60 mg total) by mouth daily. 04/19/18  Yes Wardell Honour, MD  HYDROcodone-acetaminophen [facility]) 10-325 MG tablet Take 1 tablet by mouth every 6  (six) hours as needed for pain.   Yes [provider]  Multiple Vitamins-Minerals (BARIATRIC MULTIVITAMINS/IRON) CAPS Take 1 tablet by mouth 2 (two) times daily.   Yes [provider]  topiramate (TOPAMAX) 100 MG tablet TAKE 2 TABLETS BY MOUTH TWICE DAILY 01/26/18  Yes Wardell Honour, MD  docusate sodium (COLACE) 100 MG capsule Take 1 capsule (100 mg total) by mouth 2 (two) times daily. Patient not taking: Reported on 06/10/2018 03/26/18   Eldred Manges, MD  traMADol (ULTRAM) 50 MG tablet Take 2 tablets (100 mg total) by mouth every 6 (six) hours as needed for moderate pain. Patient not taking: Reported on 06/10/2018 03/26/18   Eldred Manges, MD  vitamin B-12 1000 MCG tablet Take 1 tablet (1,000 mcg total) by mouth daily. Patient not taking: Reported on 06/10/2018 12/13/16   Barton Dubois, MD    Family History Family History  Problem Relation Age of Onset  . Breast cancer Mother 56  . Cancer Mother   . Prostate cancer Father 60  . Colon polyps Father   . Breast cancer Maternal Aunt        dx in her 32s; BRCA neg  . Prostate cancer Paternal Uncle   . Diabetes Paternal Uncle   . Heart disease Maternal Grandmother   . Diabetes Paternal Grandmother   . Prostate cancer Paternal Grandfather   . Ovarian cancer Sister 65       found during her pregnancy  . Prostate cancer Paternal Uncle   . Prostate cancer Paternal Uncle   . Cancer Other   . Stroke Other   . Diabetes Other   . Coronary artery disease Other   . Colon cancer Neg Hx   . Esophageal cancer Neg Hx   . Rectal cancer Neg Hx     Social History Social History   Tobacco Use  . Smoking status: Never Smoker  . Smokeless tobacco: Never Used  Substance Use Topics  . Alcohol use: Yes    Comment: socially  . Drug use: No     Allergies   Imitrex [sumatriptan]; Zomig [zolmitriptan]; Latex; Ibuprofen; and Sulfa antibiotics   Review of Systems Review of Systems Ten systems reviewed and are negative for  acute change, except as noted in the HPI.    Physical Exam Updated Vital Signs BP (!) 92/59 (BP Location: Right Arm)   Pulse (!) 52   Temp (!) 97.5 F (36.4 C) (Oral)   Resp 16   LMP 03/12/2018 (Exact Date)   SpO2 99%   Physical Exam  Constitutional: She is oriented to person, place, and time. She appears well-developed and well-nourished. No distress.  Nontoxic appearing and in NAD  HENT:  Head: Normocephalic.  Mouth/Throat: Oropharynx is clear and moist.  Symmetric rise of the uvula with phonation.  No hemotympanum bilaterally.  Eyes: Pupils are equal, round, and reactive to light. Conjunctivae and EOM are normal. No scleral icterus.    Neck: Normal range of motion.  No meningismus  Cardiovascular: Normal rate, regular rhythm and intact distal pulses.  Pulmonary/Chest: Effort normal. No stridor. No respiratory distress.  Respirations even and unlabored  Musculoskeletal: Normal range of motion.  Neurological: She is alert and oriented to person, place, and time. No cranial nerve deficit. She exhibits normal muscle tone. Coordination normal.  GCS 15. Speech is goal oriented. No cranial nerve deficits appreciated; symmetric eyebrow raise, no facial drooping, tongue midline. Patient has equal grip strength bilaterally with 5/5 strength against resistance in all major muscle groups bilaterally. Sensation to light touch intact. Patient moves extremities without ataxia.  Skin: Skin is warm and dry. No rash noted. She is not diaphoretic. No erythema. No pallor.  Psychiatric: She has a normal mood and affect. Her behavior is normal.  Nursing note and vitals reviewed.    ED Treatments / Results  Labs (all labs ordered are listed, but only abnormal results are displayed) Labs Reviewed  I-STAT CHEM 8, ED    EKG None  Radiology No results found.  Procedures Procedures (including critical care time)  Medications Ordered in ED Medications  prochlorperazine (COMPAZINE)  injection 10 mg (10 mg Intravenous Given 06/10/18 0227)  diphenhydrAMINE (BENADRYL) injection 25 mg (25 mg Intravenous Given 06/10/18 0227)  dexamethasone (DECADRON) injection 10 mg (10 mg Intravenous Given 06/10/18 0227)  sodium chloride 0.9 % bolus 1,000 mL (0 mLs Intravenous Stopped 06/10/18 0330)    4:37 AM Patient reports improvement in headache.  Her orthostatic vital signs are stable.  She declines any additional medication at this time.  States that she feels comfortable managing her symptoms further at home.   Initial Impression / Assessment and Plan / ED Course  I have reviewed the triage vital signs and the nursing notes.  Pertinent labs & imaging results that were available during my care of the patient were reviewed by me and considered in my medical decision making (see chart for details).      Patient presents to the emergency department for evaluation of headache which began 5 days ago.  Patient with hx of migraines.  Reports experiencing syncope in the past with severe pain; endorses syncope earlier today sustaining abrasion over right eyebrow.  No significant contusion or hematoma.  Negative for battle's sign or raccoon's eyes.    No fever, nuchal rigidity, meningismus to suggest meningitis.  Chronicity of symptoms not c/w concern for [facility].  Neurologic exam today is nonfocal.  She has a reassuring chemistry panel; no electrolyte derangements or anemia.  Orthostatic vital signs are stable.  [place] syncope score is negative.  Suspect vasovagal response to pain.  On reassessment, the patient has had improvement in headache symptoms following a migraine cocktail.  Declines additional medications for pain.  I do not believe further emergent workup is indicated at this time.  Return precautions discussed and provided.  Patient discharged in stable condition with no unaddressed concerns.   Final Clinical Impressions(s) / ED Diagnoses   Final diagnoses:  Migraine without status  migrainosus, not intractable, unspecified migraine type  Abrasion of right eyebrow, initial encounter    ED Discharge Orders    None       Antonietta Breach, PA-C 65/78/46 [id number]    Delora Fuel, MD 52/84/13  0722  

## 2018-06-10 NOTE — ED Triage Notes (Signed)
Pt complains of a migraine since Saturday night, she states that she passed out this afternoon when getting out of the shower, she has an abrasion on her right eyebrow

## 2018-06-21 MED FILL — ALPRAZolam 0.5 MG TABS: 0.5 | 30 days supply | Qty: 30 | Fill #2

## 2018-06-22 ENCOUNTER — Ambulatory Visit (INDEPENDENT_AMBULATORY_CARE_PROVIDER_SITE_OTHER): Payer: 59 | Admitting: Physician Assistant

## 2018-06-22 ENCOUNTER — Encounter: Payer: Self-pay | Admitting: Physician Assistant

## 2018-06-22 ENCOUNTER — Other Ambulatory Visit: Payer: Self-pay

## 2018-06-22 VITALS — BP 106/66 | HR 62 | Temp 98.2°F | Resp 16 | Ht 62.0 in | Wt 166.0 lb

## 2018-06-22 DIAGNOSIS — G4700 Insomnia, unspecified: Secondary | ICD-10-CM

## 2018-06-22 DIAGNOSIS — G43809 Other migraine, not intractable, without status migrainosus: Secondary | ICD-10-CM

## 2018-06-22 MED ORDER — TRAZODONE HCL 50 MG PO TABS: 50.0000 mg | ORAL_TABLET | Freq: Every evening | ORAL | 3 refills | Status: DC | PRN

## 2018-06-22 MED ORDER — BUTALBITAL-APAP-CAFFEINE 50-325-40 MG PO TABS: 1.0000 | ORAL_TABLET | Freq: Four times a day (QID) | ORAL | 0 refills | Status: DC | PRN

## 2018-06-22 MED FILL — traZODone HCL 50 MG TABS: 50 | 15 days supply | Qty: 30 | Fill #0

## 2018-06-22 MED FILL — BUTALB-ACETAMIN-CAFF 50-325: 50-325-40 | 3 days supply | Qty: 20 | Fill #0

## 2018-06-22 NOTE — Patient Instructions (Addendum)
Fioricet - take this only when your migraine gets very painful. This will make you very drowsy and help you sleep Trazadone - take this to help you sleep.  You will receive a phone call to schedule an appointment with Sher[facility] Spr[facility].   Preventative: Magnesium citrate 400mg  to 600mg  daily, riboflavin 400mg  daily, Coenzyme Q 10 100mg  three times daily.   To prevent or relieve headaches, try the following:  Cool Compress. Lie down and place a cool compress on your head.   Avoid headache triggers. If certain foods or odors seem to have triggered your migraines in the past, avoid them. A headache diary might help you identify triggers.   Include physical activity in your daily routine. Try a daily walk or other moderate aerobic exercise.   Manage stress. Find healthy ways to cope with the stressors, such as delegating tasks on your to-do list.   Practice relaxation techniques. Try deep breathing, yoga, massage and visualization.   Eat regularly. Eating regularly scheduled meals and maintaining a healthy diet might help prevent headaches. Also, drink plenty of fluids.   Follow a regular sleep schedule. Sleep deprivation might contribute to headaches  Consider biofeedback. With this mind-body technique, you learn to control certain bodily functions - such as muscle tension, heart rate and blood pressure - to prevent headaches or reduce headache pain.      Acetaminophen; Butalbital; Caffeine tablets or capsules What is this medicine? ACETAMINOPHEN; BUTALBITAL; CAFFEINE (a set a MEE noe fen; byoo TAL bi tal; KAF een) is a pain reliever. It is used to treat tension headaches. This medicine may be used for other purposes; ask your health care provider or pharmacist if you have questions. COMMON BRAND NAME(S): Alagesic, Americet, Anolor-300, Arcet, BAC, CAPACET, Dolgic Plus, Esgic, Esgic Plus, Ezol, Fioricet, [employer], Medigesic, [place], Pacaps, Phrenilin Forte, Repan, Clay,  Triad, Zebutal What should I tell my health care provider before I take this medicine? They need to know if you have any of these conditions: -drug abuse or addiction -heart or circulation problems -if you often drink alcohol -kidney disease or problems going to the bathroom -liver disease -lung disease, asthma, or breathing problems -porphyria -an unusual or allergic reaction to acetaminophen, butalbital or other barbiturates, caffeine, other medicines, foods, dyes, or preservatives -pregnant or trying to get pregnant -breast-feeding How should I use this medicine? Take this medicine by mouth with a full glass of water. Follow the directions on the prescription label. If the medicine upsets your stomach, take the medicine with food or milk. Do not take more than you are told to take. Talk to your pediatrician regarding the use of this medicine in children. Special care may be needed. Overdosage: If you think you have taken too much of this medicine contact a poison control center or emergency room at once. NOTE: This medicine is only for you. Do not share this medicine with others. What if I miss a dose? If you miss a dose, take it as soon as you can. If it is almost time for your next dose, take only that dose. Do not take double or extra doses. What may interact with this medicine? -alcohol or medicines that contain alcohol -antidepressants, especially MAOIs like isocarboxazid, phenelzine, tranylcypromine, and selegiline -antihistamines -benzodiazepines -carbamazepine -isoniazid -medicines for pain like pentazocine, buprenorphine, butorphanol, nalbuphine, tramadol, and propoxyphene -muscle relaxants -naltrexone -phenobarbital, phenytoin, and fosphenytoin -phenothiazines like perphenazine, thioridazine, chlorpromazine, mesoridazine, fluphenazine, prochlorperazine, promazine, and trifluoperazine -voriconazole This list may not describe all possible  interactions. Give your health  care provider a list of all the medicines, herbs, non-prescription drugs, or dietary supplements you use. Also tell them if you smoke, drink alcohol, or use illegal drugs. Some items may interact with your medicine. What should I watch for while using this medicine? Tell your doctor or health care professional if your pain does not go away, if it gets worse, or if you have new or a different type of pain. You may develop tolerance to the medicine. Tolerance means that you will need a higher dose of the medicine for pain relief. Tolerance is normal and is expected if you take the medicine for a long time. Do not suddenly stop taking your medicine because you may develop a severe reaction. Your body becomes used to the medicine. This does NOT mean you are addicted. Addiction is a behavior related to getting and using a drug for a non-medical reason. If you have pain, you have a medical reason to take pain medicine. Your doctor will tell you how much medicine to take. If your doctor wants you to stop the medicine, the dose will be slowly lowered over time to avoid any side effects. You may get drowsy or dizzy when you first start taking the medicine or change doses. Do not drive, use machinery, or do anything that may be dangerous until you know how the medicine affects you. Stand or sit up slowly. Do not take other medicines that contain acetaminophen with this medicine. Always read labels carefully. If you have questions, ask your doctor or pharmacist. If you take too much acetaminophen get medical help right away. Too much acetaminophen can be very dangerous and cause liver damage. Even if you do not have symptoms, it is important to get help right away. What side effects may I notice from receiving this medicine? Side effects that you should report to your doctor or health care professional as soon as possible: -allergic reactions like skin rash, itching or hives, swelling of the face, lips, or  tongue -breathing problems -confusion -feeling faint or lightheaded, falls -redness, blistering, peeling or loosening of the skin, including inside the mouth -seizure -stomach pain -yellowing of the eyes or skin Side effects that usually do not require medical attention (report to your doctor or health care professional if they continue or are bothersome): -constipation -nausea, vomiting This list may not describe all possible side effects. Call your doctor for medical advice about side effects. You may report side effects to FDA at 1-800-FDA-1088. Where should I keep my medicine? Keep out of the reach of children. This medicine can be abused. Keep your medicine in a safe place to protect it from theft. Do not share this medicine with anyone. Selling or giving away this medicine is dangerous and against the law. This medicine may cause accidental overdose and death if it taken by other adults, children, or pets. Mix any unused medicine with a substance like cat litter or coffee grounds. Then throw the medicine away in a sealed container like a sealed bag or a coffee can with a lid. Do not use the medicine after the expiration date. Store at room temperature between 15 and 30 degrees C (59 and 86 degrees F). NOTE: This sheet is a summary. It may not cover all possible information. If you have questions about this medicine, talk to your doctor, pharmacist, or health care provider.  2018 Elsevier/Gold Standard (2013-11-18 15:00:25)  IF you received an x-ray today, you will receive an invoice  from [facility]. Please contact [facility] at [phone] with questions or concerns regarding your invoice.   IF you received labwork today, you will receive an invoice from [place]. Please contact [facility] at [phone] with questions or concerns regarding your invoice.   Our billing staff will not be able to assist you with questions regarding bills from these companies.  You  will be contacted with the lab results as soon as they are available. The fastest way to get your results is to activate your My Chart account. Instructions are located on the last page of this paperwork. If you have not heard from [place] regarding the results in 2 weeks, please contact this office.

## 2018-06-22 NOTE — Progress Notes (Signed)
Tanya [name]  MRN: [id number] DOB: [birth year]  PCP: Dorise Hiss, PA-C  Subjective:  Pt is a 37 year old female PMH migraine headaches, depression, hypertension, dyslipidemia who presents to clinic for migraines. Former pt of Dr. [name].   This is a chronic problem for her. She was in the ED 06/10/2018 with migraine where she passed out and hit her head - negative work-up. She has extensive h/o "falling out" with migraines.  Since she presented to the ED migraine has improved, however she c/o "lingering headache". Denies photophobia, phonophobia, n/v, vision changes.  She takes Topamax 100 mg 4x/day x 7 years.  She does not sleep well at night. She has tried Xanax to help her sleep - no longer working. She wakes up several times/night  She has not seen neurologist in several years, Rodney Cruise at [facility]. She tried to call to make an appointment but cannot get in for several months.   She has tried: Imitrex - allergy Zomig - allergy   Review of Systems  Eyes: Negative for photophobia and visual disturbance.  Gastrointestinal: Negative for nausea and vomiting.  Neurological: Positive for headaches. Negative for dizziness, weakness and light-headedness.  Psychiatric/Behavioral: Positive for sleep disturbance. Negative for confusion.    Patient Active Problem List   Diagnosis Date Noted  . Menorrhagia 03/25/2018  . Nausea without vomiting 03/09/2018  . LUQ abdominal pain 03/09/2018  . Bowel habit changes 12/08/2017  . Bloating 12/08/2017  . Abdominal pain 09/04/2017  . Anal fissure 09/04/2017  . Constipation 09/04/2017  . S/P laparoscopic sleeve gastrectomy May 2018 02/17/2017  . Iron deficiency anemia due to chronic blood loss 02/01/2017  . SVT (supraventricular tachycardia) ([place])   . Anemia due to vitamin B12 deficiency 12/08/2016  . Menometrorrhagia 12/05/2016  . Morbid obesity ([place]) 12/05/2016  . Chronic migraine without aura without status  migrainosus, not intractable 12/05/2016    Current Outpatient Medications on File Prior to Visit  Medication Sig Dispense Refill  . ALPRAZolam (XANAX) 0.5 MG tablet Take 1 tablet (0.5 mg total) by mouth daily as needed for anxiety. 30 tablet 2  . CALCIUM PO Take 1 tablet 3 (three) times daily by mouth.    . docusate sodium (COLACE) 100 MG capsule Take 1 capsule (100 mg total) by mouth 2 (two) times daily. 10 capsule 0  . FLUoxetine (PROZAC) 20 MG tablet Take 3 tablets (60 mg total) by mouth daily. 270 tablet 1  . Multiple Vitamins-Minerals (BARIATRIC MULTIVITAMINS/IRON) CAPS Take 1 tablet by mouth 2 (two) times daily.    Marland Kitchen topiramate (TOPAMAX) 100 MG tablet TAKE 2 TABLETS BY MOUTH TWICE DAILY 360 tablet 1  . traMADol (ULTRAM) 50 MG tablet Take 2 tablets (100 mg total) by mouth every 6 (six) hours as needed for moderate pain. 30 tablet 2  . vitamin B-12 1000 MCG tablet Take 1 tablet (1,000 mcg total) by mouth daily. 30 tablet 1  . HYDROcodone-acetaminophen (NORCO) 10-325 MG tablet Take 1 tablet by mouth every 6 (six) hours as needed for pain.     Current Facility-Administered Medications on File Prior to Visit  Medication Dose Route Frequency Provider Last Rate Last Dose  . cyanocobalamin ((VITAMIN B-12)) injection 1,000 mcg  1,000 mcg Intramuscular Q30 days Wardell Honour, MD   1,000 mcg at 01/02/17 0940    Allergies  Allergen Reactions  . Imitrex [Sumatriptan] Shortness Of Breath  . Zomig [Zolmitriptan] Shortness Of Breath  . Latex Hives and Swelling  . Ibuprofen  Other (See Comments)    Can not take because of surgery (No NSAIDS)  . Sulfa Antibiotics Other (See Comments)    Unknown. Since childhood.      Objective:  BP 106/66 (BP Location: Left Arm, Patient Position: Sitting, Cuff Size: Normal)   Pulse 62   Temp 98.2 F (36.8 C) (Oral)   Resp 16   Ht 5\' 2"  (1.575 m)   Wt 166 lb (75.3 kg)   LMP 03/12/2018 (Exact Date)   SpO2 100%   BMI 30.36 kg/m   Physical Exam    Constitutional: She is oriented to person, place, and time. No distress.  Eyes: Pupils are equal, round, and reactive to light. EOM are normal.  Cardiovascular: Normal rate, regular rhythm and normal heart sounds.  Neurological: She is alert and oriented to person, place, and time.  Skin: Skin is warm and dry.  Psychiatric: Judgment normal.  Vitals reviewed.   Assessment and Plan :  1. Other migraine without status migrainosus, not intractable - pt presents for acute on chronic migraine. Medication side effect profile discussed. Plan to refer to [facility] clinic as this has been an ongoing problem for her. Preventative tips and supplements discussed.  - Ambulatory referral to Neurology - butalbital-acetaminophen-caffeine (FIORICET, ESGIC) 50-325-40 MG tablet; Take 1-2 tablets by mouth every 6 (six) hours as needed for headache.  Dispense: 20 tablet; Refill: 0  2. Insomnia, unspecified type - traZODone (DESYREL) 50 MG tablet; Take 1-2 tablets (50-100 mg total) by mouth at bedtime as needed for sleep.  Dispense: 30 tablet; Refill: 3   Whitney Burk Hoctor, PA-C  [facility] [place] 06/22/2018 8:20 AM  Please note: Portions of this report may have been transcribed using dragon voice recognition software. Every effort was made to ensure accuracy; however, inadvertent computerized transcription errors may be present.

## 2018-07-02 DIAGNOSIS — Z9884 Bariatric surgery status: Secondary | ICD-10-CM | POA: Diagnosis not present

## 2018-07-02 DIAGNOSIS — R51 Headache: Secondary | ICD-10-CM | POA: Diagnosis not present

## 2018-07-02 DIAGNOSIS — Z9079 Acquired absence of other genital organ(s): Secondary | ICD-10-CM | POA: Diagnosis not present

## 2018-07-02 DIAGNOSIS — Z79899 Other long term (current) drug therapy: Secondary | ICD-10-CM | POA: Diagnosis not present

## 2018-07-08 DIAGNOSIS — D22121 Melanocytic nevi of left upper eyelid, including canthus: Secondary | ICD-10-CM | POA: Diagnosis not present

## 2018-07-20 ENCOUNTER — Ambulatory Visit: Payer: 59 | Admitting: Physician Assistant

## 2018-07-22 MED FILL — traZODone HCL 50 MG TABS: 50 | 15 days supply | Qty: 30 | Fill #1

## 2018-07-22 MED FILL — FLUoxetine HCL 20 MG TABS: 20 | 90 days supply | Qty: 180 | Fill #1

## 2018-08-17 ENCOUNTER — Encounter: Payer: Self-pay | Admitting: Physician Assistant

## 2018-08-17 ENCOUNTER — Ambulatory Visit: Payer: 59 | Admitting: Physician Assistant

## 2018-08-17 ENCOUNTER — Other Ambulatory Visit: Payer: Self-pay

## 2018-08-17 ENCOUNTER — Ambulatory Visit (INDEPENDENT_AMBULATORY_CARE_PROVIDER_SITE_OTHER): Payer: 59

## 2018-08-17 VITALS — BP 116/78 | HR 59 | Temp 98.4°F | Resp 16 | Ht 62.0 in | Wt 164.0 lb

## 2018-08-17 DIAGNOSIS — M533 Sacrococcygeal disorders, not elsewhere classified: Secondary | ICD-10-CM

## 2018-08-17 MED ORDER — TRAMADOL HCL 50 MG PO TABS: 100.0000 mg | ORAL_TABLET | Freq: Four times a day (QID) | ORAL | 0 refills | Status: DC | PRN

## 2018-08-17 NOTE — Patient Instructions (Addendum)
  You x-ray shows a questionable displaced coccyx. You will receive a phone call to schedule an appointment with Pelvic health physical therapy. (I referred you to [facility] on [address]    Thank you for coming in today. I hope you feel we met your needs.  Feel free to call PCP if you have any questions or further requests.  Please consider signing up for MyChart if you do not already have it, as this is a great way to communicate with me.  Best,  [employer], PA-C  If you have lab work done today you will be contacted with your lab results within the next 2 weeks.  If you have not heard from [place] then please contact us. The fastest way to get your results is to register for My Chart.   IF you received an x-ray today, you will receive an invoice from [facility]. Please contact [facility] at [phone] with questions or concerns regarding your invoice.   IF you received labwork today, you will receive an invoice from [place]. Please contact [facility] at [phone] with questions or concerns regarding your invoice.   Our billing staff will not be able to assist you with questions regarding bills from these companies.  You will be contacted with the lab results as soon as they are available. The fastest way to get your results is to activate your My Chart account. Instructions are located on the last page of this paperwork. If you have not heard from [place] regarding the results in 2 weeks, please contact this office.

## 2018-08-17 NOTE — Progress Notes (Signed)
Tanya [name]  MRN: [id number] DOB: 1981-01-17  PCP: Dorise Hiss, PA-C  Subjective:  Pt is a pleasant 37 year old female who presents to clinic for tailbone pain x 3 months.  No MOI.  Worse with sitting. Pain can be up to a 9/10 if she is sitting for a long time. Getting up feels "throbbing".  She has to bring a pillow to work to sit on. Also hurts with walking.  No high intensity workouts.  No h/o constipation. She takes colace.  No falls No recent childbirth.  Gastric sleeve surgery 02/2017.   Hysterectomy surgery in 03/2018.   Denies changes to BM, pelvic pain, pain with intercourse.   Review of Systems  Gastrointestinal: Negative for abdominal pain, constipation and diarrhea.  Genitourinary: Negative for dyspareunia, dysuria, enuresis, flank pain and pelvic pain.  Musculoskeletal: Positive for arthralgias (tailbone). Negative for gait problem and joint swelling.  Skin: Negative.     Patient Active Problem List   Diagnosis Date Noted  . Menorrhagia 03/25/2018  . Nausea without vomiting 03/09/2018  . LUQ abdominal pain 03/09/2018  . Bowel habit changes 12/08/2017  . Bloating 12/08/2017  . Abdominal pain 09/04/2017  . Anal fissure 09/04/2017  . Constipation 09/04/2017  . S/P laparoscopic sleeve gastrectomy May 2018 02/17/2017  . Iron deficiency anemia due to chronic blood loss 02/01/2017  . SVT (supraventricular tachycardia) ([place])   . Anemia due to vitamin B12 deficiency 12/08/2016  . Menometrorrhagia 12/05/2016  . Morbid obesity ([place]) 12/05/2016  . Chronic migraine without aura without status migrainosus, not intractable 12/05/2016    Current Outpatient Medications on File Prior to Visit  Medication Sig Dispense Refill  . ALPRAZolam (XANAX) 0.5 MG tablet Take 1 tablet (0.5 mg total) by mouth daily as needed for anxiety. 30 tablet 2  . CALCIUM PO Take 1 tablet 3 (three) times daily by mouth.    . docusate sodium (COLACE) 100 MG capsule Take 1  capsule (100 mg total) by mouth 2 (two) times daily. 10 capsule 0  . FLUoxetine (PROZAC) 20 MG tablet Take 3 tablets (60 mg total) by mouth daily. 270 tablet 1  . Multiple Vitamins-Minerals (BARIATRIC MULTIVITAMINS/IRON) CAPS Take 1 tablet by mouth 2 (two) times daily.    Marland Kitchen topiramate (TOPAMAX) 100 MG tablet TAKE 2 TABLETS BY MOUTH TWICE DAILY 360 tablet 1  . traZODone (DESYREL) 50 MG tablet Take 1-2 tablets (50-100 mg total) by mouth at bedtime as needed for sleep. 30 tablet 3  . vitamin B-12 1000 MCG tablet Take 1 tablet (1,000 mcg total) by mouth daily. 30 tablet 1  . butalbital-acetaminophen-caffeine (FIORICET, ESGIC) 50-325-40 MG tablet Take 1-2 tablets by mouth every 6 (six) hours as needed for headache. (Patient not taking: Reported on 08/17/2018) 20 tablet 0  . HYDROcodone-acetaminophen (NORCO) 10-325 MG tablet Take 1 tablet by mouth every 6 (six) hours as needed for pain.    . traMADol (ULTRAM) 50 MG tablet Take 2 tablets (100 mg total) by mouth every 6 (six) hours as needed for moderate pain. (Patient not taking: Reported on 08/17/2018) 30 tablet 2   Current Facility-Administered Medications on File Prior to Visit  Medication Dose Route Frequency Provider Last Rate Last Dose  . cyanocobalamin ((VITAMIN B-12)) injection 1,000 mcg  1,000 mcg Intramuscular Q30 days Wardell Honour, MD   1,000 mcg at 01/02/17 0940    Allergies  Allergen Reactions  . Imitrex [Sumatriptan] Shortness Of Breath  . Zomig [Zolmitriptan] Shortness Of Breath  . Latex  Hives and Swelling  . Ibuprofen Other (See Comments)    Can not take because of surgery (No NSAIDS)  . Sulfa Antibiotics Other (See Comments)    Unknown. Since childhood.      Objective:  BP 116/78 (BP Location: Left Arm, Patient Position: Sitting, Cuff Size: Normal)   Pulse (!) 59   Temp 98.4 F (36.9 C) (Oral)   Resp 16   Ht 5\' 2"  (1.575 m)   Wt 164 lb (74.4 kg)   LMP 03/12/2018 (Exact Date)   SpO2 100%   BMI 30.00 kg/m   Physical  Exam  Constitutional: She appears well-developed and well-nourished.  Sitting in position of comfort with weight on one side of her bottom.   Skin: Skin is warm and dry.     Vitals reviewed.  Dg Sacrum/coccyx  Result Date: 08/17/2018 CLINICAL DATA:  37 year old female with tailbone pain for 3 months. Tender to palpation and firm palpable finding on physical exam. EXAM: SACRUM AND COCCYX - 2+ VIEW COMPARISON:  CT Abdomen and Pelvis 03/19/2018. FINDINGS: Bone mineralization is within normal limits. Normal sacral segments, sacral ala and SI joints. On the lateral view the coccygeal segments appear stable to the prior CT and within normal limits. No acute osseous abnormality identified. Chronic degenerative changes at the pubic symphysis. Negative visible lower abdominal and pelvic visceral contours. No discrete soft tissue abnormality. IMPRESSION: No osseous abnormality identified about the sacrum or coccyx. Electronically Signed   By: Genevie Ann M.D.   On: 08/17/2018 11:01   Assessment and Plan :  1. Pain, coccyx - pt c/o tailbone pain x 3 months. No MOI. Not suspicious of pilonidal abscess. X-ray negative. Suspect possible displaced coccyx. Plan to refer to pelvic health PT for evaluation.  - DG Sacrum/Coccyx; Future - Ambulatory referral to Physical Therapy - traMADol (ULTRAM) 50 MG tablet; Take 2 tablets (100 mg total) by mouth every 6 (six) hours as needed for moderate pain.  Dispense: 30 tablet; Refill: 0   Mercer Pod, PA-C  [facility] at [place] 08/17/2018 10:18 AM  Please note: Portions of this report may have been transcribed using dragon voice recognition software. Every effort was made to ensure accuracy; however, inadvertent computerized transcription errors may be present.

## 2018-08-19 ENCOUNTER — Ambulatory Visit: Payer: 59 | Admitting: Physician Assistant

## 2018-08-30 MED FILL — traZODone HCL 50 MG TABS: 50 | 15 days supply | Qty: 30 | Fill #2

## 2018-09-27 MED FILL — TOPIRAMATE 100 MG TABLET: 100 | 30 days supply | Qty: 150 | Fill #1

## 2018-09-27 MED FILL — traZODone HCL 50 MG TABS: 50 | 15 days supply | Qty: 30 | Fill #3

## 2018-10-15 ENCOUNTER — Other Ambulatory Visit: Payer: Self-pay | Admitting: Ophthalmology

## 2018-10-15 DIAGNOSIS — D22121 Melanocytic nevi of left upper eyelid, including canthus: Secondary | ICD-10-CM | POA: Diagnosis not present

## 2018-10-15 DIAGNOSIS — D2210 Melanocytic nevi of unspecified eyelid, including canthus: Secondary | ICD-10-CM | POA: Diagnosis not present

## 2018-10-15 MED FILL — ERYTHROMYCIN 0.5% EYE OINT: 5 | 5 days supply | Qty: 4 | Fill #0

## 2018-10-20 ENCOUNTER — Telehealth: Payer: 59 | Admitting: Nurse Practitioner

## 2018-10-20 DIAGNOSIS — M7918 Myalgia, other site: Secondary | ICD-10-CM

## 2018-10-20 NOTE — Progress Notes (Signed)
Based on what you shared with me it looks like you have an issue,that should be evaluated in a face to face office visit. You have had for to long to be treated in an  Evisit. I am so sorry could not do anything o help you.Marland Kitchen  NOTE: If you entered your credit card information for this eVisit, you will not be charged. You may see a "hold" on your card for the $30 but that hold will drop off and you will not have a charge processed.  If you are having a true medical emergency please call 911.  If you need an urgent face to face visit, [place] has four urgent care centers for your convenience.  If you need care fast and have a high deductible or no insurance consider:   [email or web address] to reserve your spot online an avoid wait times  [facility] [address], [address] [place], [place] [address] 8 am to 8 pm Monday-Friday 10 am to 4 pm Saturday-Sunday *Across the street from [employer]  [place], [address] 8 am to 5 pm Monday-Friday * In the [facility] on the [facility]   The following sites will take your  insurance:  . [facility] [place] a Provider at this Location  [address] [place], [place] [address] . 10 am to 8 pm Monday-Friday . 12 pm to 8 pm Saturday-Sunday   . [facility] at [place] a Provider at this Location  [address] [place], [place], [place] [address] . 8 am to 8 pm Monday-Friday . 9 am to 6 pm Saturday . 11 am to 6 pm Sunday   . [facility] at [place] Get Driving Directions  [address] [place], [place] [address] . 8 am to 8 pm Monday-Friday . 8 am to 4 pm Saturday-Sunday   Your e-visit answers were reviewed by a board certified advanced clinical practitioner to complete your personal  care plan.

## 2018-10-22 ENCOUNTER — Ambulatory Visit: Payer: 59 | Admitting: Family Medicine

## 2018-10-22 ENCOUNTER — Encounter: Payer: Self-pay | Admitting: Family Medicine

## 2018-10-22 VITALS — BP 106/71 | HR 92 | Temp 98.9°F | Ht 62.0 in | Wt 172.4 lb

## 2018-10-22 DIAGNOSIS — R6889 Other general symptoms and signs: Secondary | ICD-10-CM

## 2018-10-22 DIAGNOSIS — J101 Influenza due to other identified influenza virus with other respiratory manifestations: Secondary | ICD-10-CM | POA: Diagnosis not present

## 2018-10-22 LAB — POC INFLUENZA A&B (BINAX/QUICKVUE)
Influenza A, POC: POSITIVE — AB
Influenza B, POC: NEGATIVE

## 2018-10-22 MED ORDER — OSELTAMIVIR PHOSPHATE 75 MG PO CAPS: 75.0000 mg | ORAL_CAPSULE | Freq: Two times a day (BID) | ORAL | 0 refills | Status: DC

## 2018-10-22 MED FILL — OSELTAMIVIR PHOSPHATE 75 MG: 75 | 5 days supply | Qty: 10 | Fill #0

## 2018-10-22 NOTE — Patient Instructions (Addendum)
   If you have lab work done today you will be contacted with your lab results within the next 2 weeks.  If you have not heard from us then please contact us. The fastest way to get your results is to register for My Chart.   IF you received an x-ray today, you will receive an invoice from [facility]. Please contact [facility] at [phone] with questions or concerns regarding your invoice.   IF you received labwork today, you will receive an invoice from [facility]. Please contact [facility] at [phone] with questions or concerns regarding your invoice.   Our billing staff will not be able to assist you with questions regarding bills from these companies.  You will be contacted with the lab results as soon as they are available. The fastest way to get your results is to activate your My Chart account. Instructions are located on the last page of this paperwork. If you have not heard from us regarding the results in 2 weeks, please contact this office.     Influenza, Adult Influenza is also called "the flu." It is an infection in the lungs, nose, and throat (respiratory tract). It is caused by a virus. The flu causes symptoms that are similar to symptoms of a cold. It also causes a high fever and body aches. The flu spreads easily from person to person (is contagious). Getting a flu shot (influenza vaccination) every year is the best way to prevent the flu. What are the causes? This condition is caused by the influenza virus. You can get the virus by:  Breathing in droplets that are in the air from the cough or sneeze of a person who has the virus.  Touching something that has the virus on it (is contaminated) and then touching your mouth, nose, or eyes. What increases the risk? Certain things may make you more likely to get the flu. These include:  Not washing your hands often.  Having close contact with many people during cold and flu season.  Touching your  mouth, eyes, or nose without first washing your hands.  Not getting a flu shot every year. You may have a higher risk for the flu, along with serious problems such as a lung infection (pneumonia), if you:  Are older than 65.  Are pregnant.  Have a weakened disease-fighting system (immune system) because of a disease or taking certain medicines.  Have a long-term (chronic) illness, such as: ? Heart, kidney, or lung disease. ? Diabetes. ? Asthma.  Have a liver disorder.  Are very overweight (morbidly obese).  Have anemia. This is a condition that affects your red blood cells. What are the signs or symptoms? Symptoms usually begin suddenly and last 4-14 days. They may include:  Fever and chills.  Headaches, body aches, or muscle aches.  Sore throat.  Cough.  Runny or stuffy (congested) nose.  Chest discomfort.  Not wanting to eat as much as normal (poor appetite).  Weakness or feeling tired (fatigue).  Dizziness.  Feeling sick to your stomach (nauseous) or throwing up (vomiting). How is this treated? If the flu is found early, you can be treated with medicine that can help reduce how bad the illness is and how long it lasts (antiviral medicine). This may be given by mouth (orally) or through an IV tube. Taking care of yourself at home can help your symptoms get better. Your doctor may suggest:  Taking over-the-counter medicines.  Drinking plenty of fluids. The flu often   goes away on its own. If you have very bad symptoms or other problems, you may be treated in a hospital. Follow these instructions at home:     Activity  Rest as needed. Get plenty of sleep.  Stay home from work or school as told by your doctor. ? Do not leave home until you do not have a fever for 24 hours without taking medicine. ? Leave home only to visit your doctor. Eating and drinking  Take an ORS (oral rehydration solution). This is a drink that is sold at pharmacies and  stores.  Drink enough fluid to keep your pee (urine) pale yellow.  Drink clear fluids in small amounts as you are able. Clear fluids include: ? Water. ? Ice chips. ? Fruit juice that has water added (diluted fruit juice). ? Low-calorie sports drinks.  Eat bland, easy-to-digest foods in small amounts as you are able. These foods include: ? Bananas. ? Applesauce. ? Rice. ? Lean meats. ? Toast. ? Crackers.  Do not eat or drink: ? Fluids that have a lot of sugar or caffeine. ? Alcohol. ? Spicy or fatty foods. General instructions  Take over-the-counter and prescription medicines only as told by your doctor.  Use a cool mist humidifier to add moisture to the air in your home. This can make it easier for you to breathe.  Cover your mouth and nose when you cough or sneeze.  Wash your hands with soap and water often, especially after you cough or sneeze. If you cannot use soap and water, use alcohol-based hand sanitizer.  Keep all follow-up visits as told by your doctor. This is important. How is this prevented?   Get a flu shot every year. You may get the flu shot in late summer, fall, or winter. Ask your doctor when you should get your flu shot.  Avoid contact with people who are sick during fall and winter (cold and flu season). Contact a doctor if:  You get new symptoms.  You have: ? Chest pain. ? Watery poop (diarrhea). ? A fever.  Your cough gets worse.  You start to have more mucus.  You feel sick to your stomach.  You throw up. Get help right away if you:  Have shortness of breath.  Have trouble breathing.  Have skin or nails that turn a bluish color.  Have very bad pain or stiffness in your neck.  Get a sudden headache.  Get sudden pain in your face or ear.  Cannot eat or drink without throwing up. Summary  Influenza ("the flu") is an infection in the lungs, nose, and throat. It is caused by a virus.  Take over-the-counter and prescription  medicines only as told by your doctor.  Getting a flu shot every year is the best way to avoid getting the flu. This information is not intended to replace advice given to you by your health care provider. Make sure you discuss any questions you have with your health care provider. Document Released: 07/01/2008 Document Revised: 03/10/2018 Document Reviewed: 03/10/2018 Elsevier Interactive Patient Education  2019 Elsevier Inc.  

## 2018-10-22 NOTE — Progress Notes (Signed)
1/17/20202:51 PM  Tanya [name] [birth year], 38 y.o. female [id number]  Chief Complaint  Patient presents with  . Fever    flu like symptoms forthe past 2 days.     HPI:   Patient is a 38 y.o. female with past medical history significant for depression, anxiety, migraine, HTN who presents today for fever and flu like symptoms  2 days ago started with cough, body aches, sore throat, headaches Last night had a fever of 103.7 Has been taking tylenol scheduled Having nausea, not drinking much fluids Has had normal UOP No diarrhea Works in the hospital Has had flu vaccine this season  Fall Risk  10/22/2018 08/17/2018 06/22/2018 04/19/2018 03/08/2018  Falls in the past year? 0 1 Yes No No  Number falls in past yr: - 0 - - -     Depression screen [facility] 2/9 10/22/2018 08/17/2018 06/22/2018  Decreased Interest 0 0 0  Down, Depressed, Hopeless 0 0 0  PHQ - 2 Score 0 0 0    Allergies  Allergen Reactions  . Imitrex [Sumatriptan] Shortness Of Breath  . Zomig [Zolmitriptan] Shortness Of Breath  . Latex Hives and Swelling  . Ibuprofen Other (See Comments)    Can not take because of surgery (No NSAIDS)  . Sulfa Antibiotics Other (See Comments)    Unknown. Since childhood.     Prior to Admission medications   Medication Sig Start Date End Date Taking? Authorizing Provider  FLUoxetine (PROZAC) 20 MG tablet Take 3 tablets (60 mg total) by mouth daily. 04/19/18  Yes Wardell Honour, MD  Multiple Vitamins-Minerals (BARIATRIC MULTIVITAMINS/IRON) CAPS Take 1 tablet by mouth 2 (two) times daily.   Yes [provider]  topiramate (TOPAMAX) 100 MG tablet TAKE 2 TABLETS BY MOUTH TWICE DAILY 01/26/18  Yes Wardell Honour, MD  traZODone (DESYREL) 50 MG tablet Take 1-2 tablets (50-100 mg total) by mouth at bedtime as needed for sleep. 06/22/18  Yes McVey, Gelene Mink, PA-C  vitamin B-12 1000 MCG tablet Take 1 tablet (1,000 mcg total) by mouth daily. 12/13/16  Yes Barton Dubois, MD     Past Medical History:  Diagnosis Date  . Anemia   . Anxiety   . Arthritis    L knee OA  . Depression    history of depression in 12/12/2010 with death of mother  . Hyperlipidemia   . Hypertension   . Migraine   . Obesity 02/2017   has had gastric sleeve    Past Surgical History:  Procedure Laterality Date  . ABDOMINAL HYSTERECTOMY    . ANAL RECTAL MANOMETRY N/A 01/11/2018   Procedure: ANO RECTAL MANOMETRY;  Surgeon: Mauri Pole, MD;  Location: [facility];  Service: Endoscopy;  Laterality: N/A;  . CARPAL TUNNEL RELEASE Bilateral   . CESAREAN SECTION     x 1  . CHOLECYSTECTOMY    . KNEE SURGERY Left   . LAPAROSCOPIC GASTRIC SLEEVE RESECTION N/A 02/17/2017   Procedure: LAPAROSCOPIC GASTRIC SLEEVE RESECTION, UPPER ENDO;  Surgeon: Johnathan Hausen, MD;  Location: [facility];  Service: General;  Laterality: N/A;  . LAPAROSCOPIC UNILATERAL SALPINGECTOMY Left 06/10/2014   Procedure: LAPAROSCOPIC Left SALPINGECTOMY, removal of ectopic pregnancy;  Surgeon: Guss Bunde, MD;  Location: [facility];  Service: Gynecology;  Laterality: Left;  . TUBAL LIGATION    . VAGINAL HYSTERECTOMY Bilateral 03/25/2018   Procedure: HYSTERECTOMY VAGINAL, RIGHT SALPINGECTOMY;  Surgeon: Eldred Manges, MD;  Location: [facility];  Service: Gynecology;  Laterality: Bilateral;  ERAS  . WISDOM TOOTH EXTRACTION      Social History   Tobacco Use  . Smoking status: Never Smoker  . Smokeless tobacco: Never Used  Substance Use Topics  . Alcohol use: Yes    Comment: socially    Family History  Problem Relation Age of Onset  . Breast cancer Mother 3  . Cancer Mother   . Prostate cancer Father 45  . Colon polyps Father   . Breast cancer Maternal Aunt        dx in her 87s; BRCA neg  . Prostate cancer Paternal Uncle   . Diabetes Paternal Uncle   . Heart disease Maternal Grandmother   . Diabetes Paternal Grandmother   . Prostate cancer Paternal Grandfather   . Ovarian cancer Sister 84        found during her pregnancy  . Prostate cancer Paternal Uncle   . Prostate cancer Paternal Uncle   . Cancer Other   . Stroke Other   . Diabetes Other   . Coronary artery disease Other   . Colon cancer Neg Hx   . Esophageal cancer Neg Hx   . Rectal cancer Neg Hx     ROS Per hpi  OBJECTIVE:  Blood pressure 106/71, pulse 92, temperature 98.9 F (37.2 C), height '5\' 2"'$  (1.575 m), weight 172 lb 6.4 oz (78.2 kg), last menstrual period 03/12/2018, SpO2 100 %. Body mass index is 31.53 kg/m.   Physical Exam Vitals signs and nursing note reviewed.  Constitutional:      Appearance: She is well-developed. She is ill-appearing.  HENT:     Head: Normocephalic and atraumatic.     Right Ear: Hearing, tympanic membrane, ear canal and external ear normal.     Left Ear: Hearing, tympanic membrane, ear canal and external ear normal.     Mouth/Throat:     Mouth: Mucous membranes are moist.  Eyes:     Conjunctiva/sclera: Conjunctivae normal.     Pupils: Pupils are equal, round, and reactive to light.  Neck:     Musculoskeletal: Neck supple.  Cardiovascular:     Rate and Rhythm: Normal rate and regular rhythm.     Heart sounds: Normal heart sounds. No murmur. No friction rub. No gallop.   Pulmonary:     Effort: Pulmonary effort is normal.     Breath sounds: Normal breath sounds. No wheezing, rhonchi or rales.  Lymphadenopathy:     Cervical: No cervical adenopathy.  Skin:    General: Skin is warm and dry.     Capillary Refill: Capillary refill takes 2 to 3 seconds.  Neurological:     Mental Status: She is alert and oriented to person, place, and time.     Results for orders placed or performed in visit on 10/22/18 (from the past 24 hour(s))  POC Influenza A&B(BINAX/QUICKVUE)     Status: Abnormal   Collection Time: 10/22/18  2:46 PM  Result Value Ref Range   Influenza A, POC Positive (A) Negative   Influenza B, POC Negative Negative    ASSESSMENT and PLAN  1. Influenza  A Discussed supportive measures, new meds r/se/b and RTC precautions. Patient educational handout given. 2. Flu-like symptoms - POC Influenza A&B(BINAX/QUICKVUE)  Other orders - oseltamivir (TAMIFLU) 75 MG capsule; Take 1 capsule (75 mg total) by mouth 2 (two) times daily.   Return if symptoms worsen or fail to improve.    Rutherford Guys, MD [facility] [place], [place] [address]  Ph.  [phone] Fax [phone]

## 2018-10-23 ENCOUNTER — Encounter: Payer: Self-pay | Admitting: Family Medicine

## 2018-10-26 ENCOUNTER — Other Ambulatory Visit: Payer: Self-pay

## 2018-10-26 ENCOUNTER — Ambulatory Visit: Payer: 59 | Admitting: Family Medicine

## 2018-10-26 ENCOUNTER — Encounter: Payer: Self-pay | Admitting: Family Medicine

## 2018-10-26 VITALS — BP 122/81 | HR 60 | Temp 97.6°F | Ht 62.0 in | Wt 167.8 lb

## 2018-10-26 DIAGNOSIS — M533 Sacrococcygeal disorders, not elsewhere classified: Secondary | ICD-10-CM

## 2018-10-26 DIAGNOSIS — J101 Influenza due to other identified influenza virus with other respiratory manifestations: Secondary | ICD-10-CM | POA: Diagnosis not present

## 2018-10-26 NOTE — Progress Notes (Signed)
Established Patient Office Visit  Subjective:  Patient ID: Tanya [name], female    DOB: [birth year]  Age: 38 y.o. MRN: [id number]  CC:  Chief Complaint  Patient presents with  . Pain    pain due to tailbone dislocation, hurts really bad when she sits. Was suggusted PT, but disagrees with the therapy suggested. Need note from doctor saying that she can not sit for long periods of time. Works as Chartered certified accountant for [employer].  Marland Kitchen office note    needing another letter, did not go back to work due to fever from the flu. Original letter needs date changing and to indicate illness    HPI [name] presents for  Pt reports that she has pain in the tailbone due to dislocation Her PT advised her to get work restrictions to avoid long sitting which causes issues with her tailbone She denies numbness or saddle paresthesia   She reports a history of flu  She stopped having fever  She would like a return to work letter   Past Medical History:  Diagnosis Date  . Anemia   . Anxiety   . Arthritis    L knee OA  . Depression    history of depression in 2010/11/13 with death of mother  . Hyperlipidemia   . Hypertension   . Migraine   . Obesity 02/2017   has had gastric sleeve    Past Surgical History:  Procedure Laterality Date  . ABDOMINAL HYSTERECTOMY    . ANAL RECTAL MANOMETRY N/A 01/11/2018   Procedure: ANO RECTAL MANOMETRY;  Surgeon: Mauri Pole, MD;  Location: [facility];  Service: Endoscopy;  Laterality: N/A;  . CARPAL TUNNEL RELEASE Bilateral   . CESAREAN SECTION     x 1  . CHOLECYSTECTOMY    . KNEE SURGERY Left   . LAPAROSCOPIC GASTRIC SLEEVE RESECTION N/A 02/17/2017   Procedure: LAPAROSCOPIC GASTRIC SLEEVE RESECTION, UPPER ENDO;  Surgeon: Johnathan Hausen, MD;  Location: [facility];  Service: General;  Laterality: N/A;  . LAPAROSCOPIC UNILATERAL SALPINGECTOMY Left 06/10/2014   Procedure: LAPAROSCOPIC Left SALPINGECTOMY, removal of ectopic pregnancy;  Surgeon: Guss Bunde, MD;  Location: [facility];  Service: Gynecology;  Laterality: Left;  . TUBAL LIGATION    . VAGINAL HYSTERECTOMY Bilateral 03/25/2018   Procedure: HYSTERECTOMY VAGINAL, RIGHT SALPINGECTOMY;  Surgeon: Eldred Manges, MD;  Location: [facility];  Service: Gynecology;  Laterality: Bilateral;  ERAS  . WISDOM TOOTH EXTRACTION      Family History  Problem Relation Age of Onset  . Breast cancer Mother 36  . Cancer Mother   . Prostate cancer Father 46  . Colon polyps Father   . Breast cancer Maternal Aunt        dx in her 63s; BRCA neg  . Prostate cancer Paternal Uncle   . Diabetes Paternal Uncle   . Heart disease Maternal Grandmother   . Diabetes Paternal Grandmother   . Prostate cancer Paternal Grandfather   . Ovarian cancer Sister 15       found during her pregnancy  . Prostate cancer Paternal Uncle   . Prostate cancer Paternal Uncle   . Cancer Other   . Stroke Other   . Diabetes Other   . Coronary artery disease Other   . Colon cancer Neg Hx   . Esophageal cancer Neg Hx   . Rectal cancer Neg Hx     Social History   Socioeconomic History  . Marital status: Married  Spouse name: Jeneen Rinks  . Number of children: 2  . Years of education: Not on file  . Highest education level: Not on file  Occupational History  . Occupation: Research scientist (life sciences): [employer]  . Financial resource strain: Not on file  . Food insecurity:    Worry: Not on file    Inability: Not on file  . Transportation needs:    Medical: Not on file    Non-medical: Not on file  Tobacco Use  . Smoking status: Never Smoker  . Smokeless tobacco: Never Used  Substance and Sexual Activity  . Alcohol use: Yes    Comment: socially  . Drug use: No  . Sexual activity: Yes    Birth control/protection: Surgical    Comment: tubal ligation 2002  Lifestyle  . Physical activity:    Days per week: Not on file    Minutes per session: Not on file  . Stress: Not on  file  Relationships  . Social connections:    Talks on phone: Not on file    Gets together: Not on file    Attends religious service: Not on file    Active member of club or organization: Not on file    Attends meetings of clubs or organizations: Not on file    Relationship status: Not on file  . Intimate partner violence:    Fear of current or ex partner: Not on file    Emotionally abused: Not on file    Physically abused: Not on file    Forced sexual activity: Not on file  Other Topics Concern  . Not on file  Social History Narrative   Marital status: married x 6 years; second marriage      Children: 2 children (20, 25); 1 stepgrandaughter; 1 stepdaughter      Lives: with husband, one son      Employment: Chartered certified accountant since 2017; fifth floor surgical floor; third shift 3 twelve hour shifts      Tobacco: none      Alcohol: none      Drugs; none      Exercise: none    Outpatient Medications Prior to Visit  Medication Sig Dispense Refill  . FLUoxetine (PROZAC) 20 MG tablet Take 3 tablets (60 mg total) by mouth daily. 270 tablet 1  . Multiple Vitamins-Minerals (BARIATRIC MULTIVITAMINS/IRON) CAPS Take 1 tablet by mouth 2 (two) times daily.    Marland Kitchen oseltamivir (TAMIFLU) 75 MG capsule Take 1 capsule (75 mg total) by mouth 2 (two) times daily. 10 capsule 0  . topiramate (TOPAMAX) 100 MG tablet TAKE 2 TABLETS BY MOUTH TWICE DAILY 360 tablet 1  . traZODone (DESYREL) 50 MG tablet Take 1-2 tablets (50-100 mg total) by mouth at bedtime as needed for sleep. 30 tablet 3  . vitamin B-12 1000 MCG tablet Take 1 tablet (1,000 mcg total) by mouth daily. 30 tablet 1   Facility-Administered Medications Prior to Visit  Medication Dose Route Frequency Provider Last Rate Last Dose  . cyanocobalamin ((VITAMIN B-12)) injection 1,000 mcg  1,000 mcg Intramuscular Q30 days Wardell Honour, MD   1,000 mcg at 01/02/17 0940    Allergies  Allergen Reactions  . Imitrex [Sumatriptan] Shortness Of Breath  .  Zomig [Zolmitriptan] Shortness Of Breath  . Latex Hives and Swelling  . Ibuprofen Other (See Comments)    Can not take because of surgery (No NSAIDS)  . Sulfa Antibiotics Other (See Comments)  Unknown. Since childhood.     ROS Review of Systems Review of Systems  Constitutional: Negative for activity change, no appetite change, +chills and no fever.  HENT: + congestion, no nosebleeds, no trouble swallowing and voice change.   Respiratory: Negative for cough, shortness of breath and wheezing.   Gastrointestinal: Negative for diarrhea, nausea and vomiting.  Genitourinary: Negative for difficulty urinating, dysuria, flank pain and hematuria.  Musculoskeletal: + back pain, no joint swelling nor neck pain.  Neurological: Negative for dizziness, speech difficulty, light-headedness and numbness.  See HPI. All other review of systems negative.     Objective:     BP 122/81 (BP Location: Left Arm, Patient Position: Sitting, Cuff Size: Normal)   Pulse 60   Temp 97.6 F (36.4 C) (Oral)   Ht _0  (1.575 m)   Wt 167 lb 12.8 oz (76.1 kg)   LMP 03/12/2018 (Exact Date)   SpO2 100%   BMI 30.69 kg/m  Wt Readings from Last 3 Encounters:  10/26/18 167 lb 12.8 oz (76.1 kg)  10/22/18 172 lb 6.4 oz (78.2 kg)  08/17/18 164 lb (74.4 kg)    Physical Exam  Physical Exam  Constitutional: Oriented to person, place, and time. Appears well-developed and well-nourished.  HENT:  Head: Normocephalic and atraumatic.  Eyes: Conjunctivae and EOM are normal.  Cardiovascular: Normal rate, regular rhythm, normal heart sounds and intact distal pulses.  No murmur heard. Pulmonary/Chest: Effort normal and breath sounds normal. No stridor. No respiratory distress. Has no wheezes.  Neurological: Is alert and oriented to person, place, and time.  Skin: Skin is warm. Capillary refill takes less than 2 seconds.  Psychiatric: Has a normal mood and affect. Behavior is normal. Judgment and thought content normal.     Musculoskeletal: mild low back tenderness, no lumbar or thoracic deviation  There are no preventive care reminders to display for this patient.  There are no preventive care reminders to display for this patient.  Lab Results  Component Value Date   TSH 1.880 04/19/2018   Lab Results  Component Value Date   WBC 5.1 04/19/2018   HGB 12.6 06/10/2018   HCT 37.0 06/10/2018   MCV 81 04/19/2018   PLT 365 04/19/2018   Lab Results  Component Value Date   NA 142 06/10/2018   K 3.6 06/10/2018   CO2 27 03/19/2018   GLUCOSE 82 06/10/2018   BUN 13 06/10/2018   CREATININE 0.80 06/10/2018   BILITOT 0.4 03/19/2018   ALKPHOS 30 (L) 03/19/2018   AST 17 03/19/2018   ALT 20 03/19/2018   PROT 5.9 (L) 03/19/2018   ALBUMIN 3.5 03/19/2018   CALCIUM 8.5 (L) 03/19/2018   ANIONGAP 5 03/19/2018   Lab Results  Component Value Date   CHOL (H) 10/05/2007    235        ATP III CLASSIFICATION:  <200     mg/dL   Desirable  200-239  mg/dL   Borderline High  >=240    mg/dL   High   Lab Results  Component Value Date   HDL 33 (L) 10/05/2007   Lab Results  Component Value Date   LDLCALC (H) 10/05/2007    171        Total Cholesterol/HDL:CHD Risk Coronary Heart Disease Risk Table                     Men   Women  1/2 Average Risk   3.4   3.3   Lab Results  Component Value Date   TRIG 153 (H) 10/05/2007   Lab Results  Component Value Date   CHOLHDL 7.1 10/05/2007   Lab Results  Component Value Date   HGBA1C  10/05/2007    5.7 (NOTE)   The ADA recommends the following therapeutic goals for glycemic   control related to Hgb A1C measurement:   Goal of Therapy:   < 7.0% Hgb A1C   Action Suggested:  > 8.0% Hgb A1C   Ref:  Diabetes Care, 22, Suppl. 1, 1999      Assessment & Plan:   Problem List Items Addressed This Visit    None    Visit Diagnoses    Pain, coccyx    -  Primary  Advised follow up with PT Explained the xray     Influenza A    -  Gave work note      No orders  of the defined types were placed in this encounter.   Follow-up: Return if symptoms worsen or fail to improve.    Forrest Moron, MD

## 2018-10-26 NOTE — Patient Instructions (Signed)
° ° ° °  If you have lab work done today you will be contacted with your lab results within the next 2 weeks.  If you have not heard from us then please contact us. The fastest way to get your results is to register for My Chart. ° ° °IF you received an x-ray today, you will receive an invoice from [facility]. Please contact [facility] at [phone] with questions or concerns regarding your invoice.  ° °IF you received labwork today, you will receive an invoice from [facility]. Please contact [facility] at [phone] with questions or concerns regarding your invoice.  ° °Our billing staff will not be able to assist you with questions regarding bills from these companies. ° °You will be contacted with the lab results as soon as they are available. The fastest way to get your results is to activate your My Chart account. Instructions are located on the last page of this paperwork. If you have not heard from us regarding the results in 2 weeks, please contact this office. °  ° ° ° °

## 2018-11-15 ENCOUNTER — Other Ambulatory Visit: Payer: Self-pay | Admitting: Physician Assistant

## 2018-11-15 DIAGNOSIS — G4700 Insomnia, unspecified: Secondary | ICD-10-CM

## 2018-11-15 MED FILL — traZODone HCL 50 MG TABS: 50 | 45 days supply | Qty: 90 | Fill #0

## 2018-11-15 NOTE — Telephone Encounter (Signed)
Patient called, left VM to return call to the office to schedule with a new provider in order to receive refills. Advised Whitney McVey, PA-C is part-time and she will need to establish with either Dr. Pamella Pert, Dr. Mitchel Honour, or Dr. Nolon Rod.

## 2018-11-30 ENCOUNTER — Encounter (HOSPITAL_COMMUNITY): Payer: Self-pay

## 2018-11-30 ENCOUNTER — Emergency Department (HOSPITAL_COMMUNITY)
Admission: EM | Admit: 2018-11-30 | Discharge: 2018-11-30 | Disposition: A | Discharge status: Home / Self Care | Payer: 59 | Attending: Emergency Medicine | Admitting: Emergency Medicine

## 2018-11-30 ENCOUNTER — Other Ambulatory Visit: Payer: Self-pay

## 2018-11-30 ENCOUNTER — Emergency Department (HOSPITAL_COMMUNITY): Payer: 59

## 2018-11-30 DIAGNOSIS — K5900 Constipation, unspecified: Secondary | ICD-10-CM | POA: Diagnosis not present

## 2018-11-30 DIAGNOSIS — R1084 Generalized abdominal pain: Secondary | ICD-10-CM | POA: Insufficient documentation

## 2018-11-30 DIAGNOSIS — Z91040 Latex allergy status: Secondary | ICD-10-CM | POA: Insufficient documentation

## 2018-11-30 DIAGNOSIS — R1013 Epigastric pain: Secondary | ICD-10-CM | POA: Diagnosis present

## 2018-11-30 DIAGNOSIS — R110 Nausea: Secondary | ICD-10-CM | POA: Diagnosis not present

## 2018-11-30 DIAGNOSIS — I10 Essential (primary) hypertension: Secondary | ICD-10-CM | POA: Insufficient documentation

## 2018-11-30 DIAGNOSIS — R109 Unspecified abdominal pain: Secondary | ICD-10-CM | POA: Diagnosis not present

## 2018-11-30 DIAGNOSIS — Z79899 Other long term (current) drug therapy: Secondary | ICD-10-CM | POA: Insufficient documentation

## 2018-11-30 LAB — COMPREHENSIVE METABOLIC PANEL
ALT: 30 U/L (ref 0–44)
AST: 22 U/L (ref 15–41)
Albumin: 4 g/dL (ref 3.5–5.0)
Alkaline Phosphatase: 34 U/L — ABNORMAL LOW (ref 38–126)
Anion gap: 6 (ref 5–15)
BUN: 24 mg/dL — ABNORMAL HIGH (ref 6–20)
CO2: 24 mmol/L (ref 22–32)
CREATININE: 0.83 mg/dL (ref 0.44–1.00)
Calcium: 8.8 mg/dL — ABNORMAL LOW (ref 8.9–10.3)
Chloride: 109 mmol/L (ref 98–111)
GFR calc Af Amer: >60 mL/min (ref >60)
GFR calc non Af Amer: >60 mL/min (ref >60)
Glucose, Bld: 95 mg/dL (ref 70–99)
Potassium: 3.5 mmol/L (ref 3.5–5.1)
Sodium: 139 mmol/L (ref 135–145)
Total Bilirubin: 0.2 mg/dL — ABNORMAL LOW (ref 0.3–1.2)
Total Protein: 6.7 g/dL (ref 6.5–8.1)

## 2018-11-30 LAB — URINALYSIS, ROUTINE W REFLEX MICROSCOPIC
Bilirubin Urine: NEGATIVE
Glucose, UA: NEGATIVE mg/dL
Hgb urine dipstick: NEGATIVE
Ketones, ur: NEGATIVE mg/dL
Leukocytes,Ua: NEGATIVE
Nitrite: NEGATIVE
PH: 5 (ref 5.0–8.0)
Protein, ur: NEGATIVE mg/dL
Specific Gravity, Urine: 1.025 (ref 1.005–1.030)

## 2018-11-30 LAB — CBC WITH DIFFERENTIAL/PLATELET
Abs Immature Granulocytes: 0.02 10*3/uL (ref 0.00–0.07)
BASOS ABS: 0.1 10*3/uL (ref 0.0–0.1)
Basophils Relative: 1 %
Eosinophils Absolute: 0.1 10*3/uL (ref 0.0–0.5)
Eosinophils Relative: 2 %
HCT: 39.2 % (ref 36.0–46.0)
Hemoglobin: 12.6 g/dL (ref 12.0–15.0)
IMMATURE GRANULOCYTES: 0 %
Lymphocytes Relative: 42 %
Lymphs Abs: 2.6 10*3/uL (ref 0.7–4.0)
MCH: 28.3 pg (ref 26.0–34.0)
MCHC: 32.1 g/dL (ref 30.0–36.0)
MCV: 87.9 fL (ref 80.0–100.0)
Monocytes Absolute: 0.4 10*3/uL (ref 0.1–1.0)
Monocytes Relative: 7 %
NEUTROS ABS: 2.9 10*3/uL (ref 1.7–7.7)
Neutrophils Relative %: 48 %
Platelets: 268 10*3/uL (ref 150–400)
RBC: 4.46 MIL/uL (ref 3.87–5.11)
RDW: 12.5 % (ref 11.5–15.5)
WBC: 6.2 10*3/uL (ref 4.0–10.5)
nRBC: 0 % (ref 0.0–0.2)

## 2018-11-30 LAB — LIPASE, BLOOD: Lipase: 45 U/L (ref 11–51)

## 2018-11-30 MED ORDER — MAGNESIUM CITRATE PO SOLN: 296.0000 mL | Freq: Once | ORAL | 0 refills | Status: AC

## 2018-11-30 MED ORDER — IOPAMIDOL (ISOVUE-300) INJECTION 61%
100.0000 mL | Freq: Once | INTRAVENOUS | Status: AC | PRN
  Administered 2018-11-30: 100 mL via INTRAVENOUS

## 2018-11-30 MED ORDER — SODIUM CHLORIDE 0.9 % IV BOLUS
1000.0000 mL | Freq: Once | INTRAVENOUS | Status: AC
  Administered 2018-11-30: 1000 mL via INTRAVENOUS

## 2018-11-30 MED ORDER — IOPAMIDOL (ISOVUE-300) INJECTION 61%
INTRAVENOUS | Status: AC
  Filled 2018-11-30: qty 100

## 2018-11-30 MED ORDER — SODIUM CHLORIDE (PF) 0.9 % IJ SOLN
INTRAMUSCULAR | Status: AC
  Filled 2018-11-30: qty 50

## 2018-11-30 MED ORDER — ONDANSETRON 4 MG PO TBDP
4.0000 mg | ORAL_TABLET | Freq: Once | ORAL | Status: AC
  Administered 2018-11-30: 4 mg via ORAL
  Filled 2018-11-30: qty 1

## 2018-11-30 MED ORDER — ONDANSETRON HCL 4 MG/2ML IJ SOLN
4.0000 mg | Freq: Once | INTRAMUSCULAR | Status: AC
  Administered 2018-11-30: 4 mg via INTRAVENOUS
  Filled 2018-11-30: qty 2

## 2018-11-30 MED ORDER — MORPHINE SULFATE (PF) 4 MG/ML IV SOLN
4.0000 mg | Freq: Once | INTRAVENOUS | Status: AC
  Administered 2018-11-30: 4 mg via INTRAVENOUS
  Filled 2018-11-30: qty 1

## 2018-11-30 NOTE — ED Provider Notes (Signed)
[facility] DEPT Provider Note   CSN: [id number] Arrival date & time: 11/30/18  1901    History   Chief Complaint Chief Complaint  Patient presents with  . Abdominal Pain    HPI Tanya [name] is a 38 y.o. female.     HPI  Patient is a 38 year old female with a history of obesity, anemia, migraines, status post laparoscopic sleeve gastrectomy in May 2018, status post hysterectomy, status post cholecystectomy presenting for abdominal pain.  Patient reports that it started today and has progressively worsened at the day.  She describes it as starting in the epigastric region, migrating to the lower abdomen.  She describes it as a crampy abdominal pain.  She reports that she has had nausea without vomiting.  Patient denies any radiation to the back or flank.  She denies any dysuria, urgency, frequency.  She denies any vaginal discharge.  Patient denies any fevers or chills.  Patient reports that she is chronically constipated, last bowel movement 2 to 3 days ago.  She is still passing gas.  Patient reports that she is on a bowel regimen of as needed Senokot, as needed magnesium citrate,   Past Medical History:  Diagnosis Date  . Anemia   . Anxiety   . Arthritis    L knee OA  . Depression    history of depression in 12/22/10 with death of mother  . Hyperlipidemia   . Hypertension   . Migraine   . Obesity 02/2017   has had gastric sleeve    Patient Active Problem List   Diagnosis Date Noted  . Menorrhagia 03/25/2018  . Nausea without vomiting 03/09/2018  . LUQ abdominal pain 03/09/2018  . Bowel habit changes 12/08/2017  . Bloating 12/08/2017  . Abdominal pain 09/04/2017  . Anal fissure 09/04/2017  . Constipation 09/04/2017  . S/P laparoscopic sleeve gastrectomy May 2018 02/17/2017  . Iron deficiency anemia due to chronic blood loss 02/01/2017  . SVT (supraventricular tachycardia) ([place])   . Anemia due to vitamin B12 deficiency 12/08/2016  .  Menometrorrhagia 12/05/2016  . Morbid obesity ([place]) 12/05/2016  . Chronic migraine without aura without status migrainosus, not intractable 12/05/2016    Past Surgical History:  Procedure Laterality Date  . ABDOMINAL HYSTERECTOMY    . ANAL RECTAL MANOMETRY N/A 01/11/2018   Procedure: ANO RECTAL MANOMETRY;  Surgeon: Mauri Pole, MD;  Location: [facility];  Service: Endoscopy;  Laterality: N/A;  . CARPAL TUNNEL RELEASE Bilateral   . CESAREAN SECTION     x 1  . CHOLECYSTECTOMY    . KNEE SURGERY Left   . LAPAROSCOPIC GASTRIC SLEEVE RESECTION N/A 02/17/2017   Procedure: LAPAROSCOPIC GASTRIC SLEEVE RESECTION, UPPER ENDO;  Surgeon: Johnathan Hausen, MD;  Location: [facility];  Service: General;  Laterality: N/A;  . LAPAROSCOPIC UNILATERAL SALPINGECTOMY Left 06/10/2014   Procedure: LAPAROSCOPIC Left SALPINGECTOMY, removal of ectopic pregnancy;  Surgeon: Guss Bunde, MD;  Location: [facility];  Service: Gynecology;  Laterality: Left;  . TUBAL LIGATION    . VAGINAL HYSTERECTOMY Bilateral 03/25/2018   Procedure: HYSTERECTOMY VAGINAL, RIGHT SALPINGECTOMY;  Surgeon: Eldred Manges, MD;  Location: [facility];  Service: Gynecology;  Laterality: Bilateral;  ERAS  . WISDOM TOOTH EXTRACTION       OB History    Gravida  3   Para  2   Term  1   Preterm      AB      Living  2  SAB      TAB      Ectopic      Multiple      Live Births  2            Home Medications    Prior to Admission medications   Medication Sig Start Date End Date Taking? Authorizing Provider  Calcium 600-400 MG-UNIT CHEW Chew 1 tablet by mouth daily.   Yes [provider]  FLUoxetine (PROZAC) 20 MG tablet Take 3 tablets (60 mg total) by mouth daily. 04/19/18  Yes Wardell Honour, MD  Multiple Vitamins-Minerals (BARIATRIC MULTIVITAMINS/IRON) CAPS Take 1 tablet by mouth daily.    Yes [provider]  topiramate (TOPAMAX) 100 MG tablet TAKE 2 TABLETS BY MOUTH TWICE  DAILY Patient taking differently: Take 200 mg by mouth 2 (two) times daily.  01/26/18  Yes Wardell Honour, MD  traZODone (DESYREL) 50 MG tablet Take 1-2 tablets (50-100 mg total) by mouth at bedtime as needed. for sleep. Call the office to schedule appointment. 11/15/18  Yes McVey, Gelene Mink, PA-C  vitamin B-12 1000 MCG tablet Take 1 tablet (1,000 mcg total) by mouth daily. 12/13/16  Yes Barton Dubois, MD  oseltamivir (TAMIFLU) 75 MG capsule Take 1 capsule (75 mg total) by mouth 2 (two) times daily. Patient not taking: Reported on 11/30/2018 10/22/18   Rutherford Guys, MD    Family History Family History  Problem Relation Age of Onset  . Breast cancer Mother 61  . Cancer Mother   . Prostate cancer Father 52  . Colon polyps Father   . Breast cancer Maternal Aunt        dx in her 69s; BRCA neg  . Prostate cancer Paternal Uncle   . Diabetes Paternal Uncle   . Heart disease Maternal Grandmother   . Diabetes Paternal Grandmother   . Prostate cancer Paternal Grandfather   . Ovarian cancer Sister 19       found during her pregnancy  . Prostate cancer Paternal Uncle   . Prostate cancer Paternal Uncle   . Cancer Other   . Stroke Other   . Diabetes Other   . Coronary artery disease Other   . Colon cancer Neg Hx   . Esophageal cancer Neg Hx   . Rectal cancer Neg Hx     Social History Social History   Tobacco Use  . Smoking status: Never Smoker  . Smokeless tobacco: Never Used  Substance Use Topics  . Alcohol use: Yes    Comment: socially  . Drug use: No     Allergies   Imitrex [sumatriptan]; Zomig [zolmitriptan]; Latex; Ibuprofen; and Sulfa antibiotics   Review of Systems Review of Systems  Constitutional: Negative for chills and fever.  HENT: Negative for congestion and sore throat.   Eyes: Negative for visual disturbance.  Respiratory: Negative for cough, chest tightness and shortness of breath.   Cardiovascular: Negative for chest pain and palpitations.   Gastrointestinal: Positive for abdominal pain, constipation and nausea. Negative for blood in stool, diarrhea and vomiting.  Genitourinary: Negative for dysuria and flank pain.  Musculoskeletal: Negative for back pain and myalgias.  Skin: Negative for rash.  Neurological: Negative for dizziness and syncope.     Physical Exam Updated Vital Signs BP 114/65 (BP Location: Left Arm)   Pulse 73   Temp 97.6 F (36.4 C) (Oral)   Resp 20   LMP 03/12/2018 (Exact Date)   SpO2 100%   Physical Exam Vitals signs and nursing  note reviewed.  Constitutional:      General: She is not in acute distress.    Appearance: She is well-developed.  HENT:     Head: Normocephalic and atraumatic.  Eyes:     Conjunctiva/sclera: Conjunctivae normal.     Pupils: Pupils are equal, round, and reactive to light.  Neck:     Musculoskeletal: Normal range of motion and neck supple.  Cardiovascular:     Rate and Rhythm: Normal rate and regular rhythm.     Heart sounds: S1 normal and S2 normal. No murmur.  Pulmonary:     Effort: Pulmonary effort is normal.     Breath sounds: Normal breath sounds. No wheezing or rales.  Abdominal:     General: Bowel sounds are normal. There is no distension.     Palpations: Abdomen is soft.     Tenderness: There is abdominal tenderness in the suprapubic area and left lower quadrant. There is no guarding or rebound.  Musculoskeletal: Normal range of motion.        General: No deformity.  Lymphadenopathy:     Cervical: No cervical adenopathy.  Skin:    General: Skin is warm and dry.     Findings: No erythema or rash.  Neurological:     Mental Status: She is alert.     Comments: Cranial nerves grossly intact. Patient moves extremities symmetrically and with good coordination.  Psychiatric:        Behavior: Behavior normal.        Thought Content: Thought content normal.        Judgment: Judgment normal.      ED Treatments / Results  Labs (all labs ordered are  listed, but only abnormal results are displayed) Labs Reviewed  COMPREHENSIVE METABOLIC PANEL - Abnormal; Notable for the following components:      Result Value   BUN 24 (*)    Calcium 8.8 (*)    Alkaline Phosphatase 34 (*)    Total Bilirubin 0.2 (*)    All other components within normal limits  URINALYSIS, ROUTINE W REFLEX MICROSCOPIC - Abnormal; Notable for the following components:   APPearance HAZY (*)    All other components within normal limits  CBC WITH DIFFERENTIAL/PLATELET  LIPASE, BLOOD    EKG None  Radiology Ct Abdomen Pelvis W Contrast  Result Date: 11/30/2018 CLINICAL DATA:  Mid to lower abdominal pain EXAM: CT ABDOMEN AND PELVIS WITH CONTRAST TECHNIQUE: Multidetector CT imaging of the abdomen and pelvis was performed using the standard protocol following bolus administration of intravenous contrast. CONTRAST:  182m ISOVUE-300 IOPAMIDOL (ISOVUE-300) INJECTION 61% COMPARISON:  03/19/2018 FINDINGS: Lower chest: Lung bases are clear. No effusions. Heart is normal size. Hepatobiliary: No focal liver abnormality is seen. Status post cholecystectomy. No biliary dilatation. Pancreas: No focal abnormality or ductal dilatation. Spleen: No focal abnormality.  Normal size. Adrenals/Urinary Tract: No adrenal abnormality. No focal renal abnormality. No stones or hydronephrosis. Urinary bladder is unremarkable. Stomach/Bowel: Normal appendix. Stomach, large and small bowel grossly unremarkable. Moderate stool burden. Changes of prior gastric sleeve. Vascular/Lymphatic: No evidence of aneurysm or adenopathy. Reproductive: Prior hysterectomy.  No adnexal masses. Other: No free fluid or free air. Musculoskeletal: No acute bony abnormality. IMPRESSION: Moderate stool burden throughout the colon. Prior gastric sleeve, cholecystectomy and hysterectomy. No acute findings. Electronically Signed   By: KRolm BaptiseM.D.   On: 11/30/2018 21:21    Procedures Procedures (including critical care  time)  Medications Ordered in ED Medications  iopamidol (ISOVUE-300) 61 %  injection (has no administration in time range)  sodium chloride (PF) 0.9 % injection (has no administration in time range)  sodium chloride 0.9 % bolus 1,000 mL (0 mLs Intravenous Stopped 11/30/18 2137)  morphine 4 MG/ML injection 4 mg (4 mg Intravenous Given 11/30/18 2017)  ondansetron (ZOFRAN) injection 4 mg (4 mg Intravenous Given 11/30/18 2017)  iopamidol (ISOVUE-300) 61 % injection 100 mL (100 mLs Intravenous Contrast Given 11/30/18 2107)  ondansetron (ZOFRAN-ODT) disintegrating tablet 4 mg (4 mg Oral Given 11/30/18 2137)     Initial Impression / Assessment and Plan / ED Course  I have reviewed the triage vital signs and the nursing notes.  Pertinent labs & imaging results that were available during my care of the patient were reviewed by me and considered in my medical decision making (see chart for details).  Clinical Course as of Dec 01 2227  Tue Nov 30, 2018  2211 Patient deferred rectal exam.   [AM]  2222 Patient verbally verified a safe ride from the ED. Proceeded with prescribing morphine for pain/relaxtion/muscle relaxation in the ED.   [AM]    Clinical Course User Index [AM] Albesa Seen, PA-C       Patient is nontoxic-appearing, afebrile, hemodynamically stable with nonsurgical abdomen.  Differential diagnosis includes pancreatitis, appendicitis, diverticulitis, small bowel obstruction, internal hernia, urinary tract infection.  I am doubtful of any pelvic pathology at this time, as patient has had hysterectomy.  Additionally, there are no adnexal masses or changes to the bilateral adnexa on CT scan.  Unlikely to be torsion.  No evidence of internal hernia or anastomotic leak on CT scan.  Work-up is demonstrating no leukocytosis.  No significant laboratory abnormalities.  Patient's CT scan demonstrating no acute abnormalities.  Patient does have postsurgical changes patient does have a  moderate stool burden.  Discussed utility of rectal exam, and patient deferred.  I discussed with the patient that I recommend laxative therapy.  Patient has been taking 1 to 2 teaspoons at a time of magnesium citrate.  Recommended higher dose for significant constipation.  Patient stated that she would rather take magnesium citrate at home rather than in the emergency department.  At this time, I suspect the patient's symptoms are secondary to gas and constipation patient is of a significant history of constipation, and she has been underdosing her magnesium citrate.  Patient is tolerating p.o. here in the emergency department and comfortable on exam.  Return precautions given for any fevers with pain, tractable nausea or vomiting, or worsening pain.  Patient is in understanding and agrees with the plan of care.  Recommend follow-up with primary care, and discussed chronic constipation management.  Final Clinical Impressions(s) / ED Diagnoses   Final diagnoses:  Generalized abdominal pain  Constipation, unspecified constipation type    ED Discharge Orders         Ordered    magnesium citrate solution   Once     11/30/18 2224           [name] 11/30/18 2336    Lacretia Leigh, MD 12/02/18 1027

## 2018-11-30 NOTE — ED Triage Notes (Signed)
Patient presented to ed with c/o abdominal pain started this afternoon. Patient  had bariatric surgery about 2 years ago. Patient rating her pain 10/10.

## 2018-11-30 NOTE — ED Notes (Signed)
Patient transported to radiology

## 2018-11-30 NOTE — Discharge Instructions (Addendum)
Please see the information and instructions below regarding your visit.  Your diagnoses today include:  1. Generalized abdominal pain   2. Constipation, unspecified constipation type     Your exam and testing today is reassuring that there is not a condition causing your abdominal pain that we immediately need to intervene on at this time.   Abdominal (belly) pain can be caused by many things. Your caregiver performed an examination and possibly ordered blood/urine tests and imaging (CT scan, x-rays, ultrasound). Many cases can be observed and treated at home after initial evaluation in the emergency department. Even though you are being discharged home, abdominal pain can be unpredictable. Therefore, you need a repeated exam if your pain does not resolve, returns, or worsens. Most patients with abdominal pain don't have to be admitted to the hospital or have surgery, but serious problems like appendicitis and gallbladder attacks can start out as nonspecific pain. Many abdominal conditions cannot be diagnosed in one visit, so follow-up evaluations are very important.  Tests performed today include: Blood counts and electrolytes Blood tests to check liver and kidney function Blood tests to check pancreas function Urine test to look for infection and pregnancy (in women) Vital signs. See below for your results today.   See side panel of your discharge paperwork for testing performed today. Vital signs are listed at the bottom of these instructions.   Medications prescribed:    Take any prescribed medications only as prescribed, and any over the counter medications only as directed on the packaging.  For acute constipation please take magnesium citrate, the bottle of 296 mL at one time to produce a bowel movement.  Home care instructions:  Try eating, but start with foods that have a lot of fluid in them. Good examples are soup, Jell-O, and popsicles. If you do OK with those foods, you can  try soft, bland foods, such as plain yogurt. Foods that are high in carbohydrates ("carbs"), like bread or saltine crackers, can help settle your stomach. Some people also find that ginger helps with nausea. You should avoid foods that have a lot of fat in them. They can make nausea worse. Call your doctor if your symptoms come back when you try to eat.  Please focus on clear liquids for hydration.   Please follow any educational materials contained in this packet.   Follow-up instructions: Please follow-up with your primary care provider in one week for further evaluation of your symptoms if they are not completely improved.   Return instructions:  Please return to the Emergency Department if you experience worsening symptoms.  SEEK IMMEDIATE MEDICAL ATTENTION IF: The pain does not go away or becomes severe  A temperature above 101F develops  Repeated vomiting occurs (multiple episodes)  The pain becomes localized to portions of the abdomen. The right side could possibly be appendicitis. In an adult, the left lower portion of the abdomen could be colitis or diverticulitis.  Blood is being passed in stools or vomit (bright red or black tarry stools)  You develop chest pain, difficulty breathing, dizziness or fainting, or become confused, poorly responsive, or inconsolable (young children) If you have any other emergent concerns regarding your health  Additional Information:   Your vital signs today were: BP 114/65 (BP Location: Left Arm)    Pulse 73    Temp 97.6 F (36.4 C) (Oral)    Resp 20    LMP 03/12/2018 (Exact Date)    SpO2 100%  If your blood pressure (  BP) was elevated on multiple readings during this visit above 130 for the top number or above 80 for the bottom number, please have this repeated by your primary care provider within one month. --------------  Thank you for allowing [place] to participate in your care today.

## 2018-11-30 NOTE — ED Notes (Signed)
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

## 2018-12-06 ENCOUNTER — Other Ambulatory Visit: Payer: Self-pay | Admitting: Family Medicine

## 2018-12-06 DIAGNOSIS — Z1231 Encounter for screening mammogram for malignant neoplasm of breast: Secondary | ICD-10-CM

## 2018-12-20 MED FILL — TOPIRAMATE 100 MG TABLET: 100 | 30 days supply | Qty: 150 | Fill #2

## 2018-12-24 ENCOUNTER — Other Ambulatory Visit: Payer: Self-pay

## 2018-12-24 ENCOUNTER — Encounter: Payer: Self-pay | Admitting: Family Medicine

## 2018-12-24 ENCOUNTER — Ambulatory Visit (INDEPENDENT_AMBULATORY_CARE_PROVIDER_SITE_OTHER): Payer: 59

## 2018-12-24 ENCOUNTER — Ambulatory Visit: Payer: 59 | Admitting: Family Medicine

## 2018-12-24 VITALS — BP 102/60 | HR 68 | Temp 97.8°F | Ht 62.0 in | Wt 174.0 lb

## 2018-12-24 DIAGNOSIS — R1031 Right lower quadrant pain: Secondary | ICD-10-CM

## 2018-12-24 DIAGNOSIS — R143 Flatulence: Secondary | ICD-10-CM | POA: Diagnosis not present

## 2018-12-24 DIAGNOSIS — R509 Fever, unspecified: Secondary | ICD-10-CM | POA: Diagnosis not present

## 2018-12-24 LAB — COMPREHENSIVE METABOLIC PANEL
ALT: 26 IU/L (ref 0–32)
AST: 18 IU/L (ref 0–40)
Albumin/Globulin Ratio: 2.2 (ref 1.2–2.2)
Albumin: 4.1 g/dL (ref 3.8–4.8)
Alkaline Phosphatase: 33 IU/L — ABNORMAL LOW (ref 39–117)
BUN/Creatinine Ratio: 24 — ABNORMAL HIGH (ref 9–23)
BUN: 18 mg/dL (ref 6–24)
Bilirubin Total: 0.4 mg/dL (ref 0.0–1.2)
CO2: 24 mmol/L (ref 20–29)
Calcium: 8.7 mg/dL (ref 8.7–10.2)
Chloride: 109 mmol/L — ABNORMAL HIGH (ref 96–106)
Creatinine, Ser: 0.76 mg/dL (ref 0.57–1.00)
GFR calc Af Amer: 113 mL/min/{1.73_m2} (ref >59)
GFR calc non Af Amer: 98 mL/min/{1.73_m2} (ref >59)
Globulin, Total: 1.9 g/dL (ref 1.5–4.5)
Glucose: 96 mg/dL (ref 65–99)
Potassium: 4.1 mmol/L (ref 3.5–5.2)
Sodium: 139 mmol/L (ref 134–144)
Total Protein: 6 g/dL (ref 6.0–8.5)

## 2018-12-24 LAB — POCT URINALYSIS DIP (MANUAL ENTRY)
Bilirubin, UA: NEGATIVE
Blood, UA: NEGATIVE
Glucose, UA: NEGATIVE mg/dL
Ketones, POC UA: NEGATIVE mg/dL
Leukocytes, UA: NEGATIVE
Nitrite, UA: NEGATIVE
Protein Ur, POC: NEGATIVE mg/dL
Spec Grav, UA: 1.025 (ref 1.010–1.025)
Urobilinogen, UA: 0.2 E.U./dL
pH, UA: 5.5 (ref 5.0–8.0)

## 2018-12-24 LAB — POC INFLUENZA A&B (BINAX/QUICKVUE)
Influenza A, POC: NEGATIVE
Influenza B, POC: NEGATIVE

## 2018-12-24 NOTE — Progress Notes (Signed)
3/20/20209:39 AM  Tanya [name] [birth year], 38 y.o., female [id number]  Chief Complaint  Patient presents with  . Illness    fever comes and goes, husband is concerned. Some diarrhea yesterday.    HPI:   Patient is a 38 y.o. 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 0 0 0 1 Yes  Number falls in past yr: 0 - - 0 -  Injury with Fall? 0 - - - -     Depression screen [facility] 2/9 12/24/2018 10/26/2018 10/22/2018  Decreased Interest 0 0 0  Down, Depressed, Hopeless 0 0 0  PHQ - 2 Score 0 0 0    Allergies  Allergen Reactions  . Imitrex [Sumatriptan] Shortness Of Breath  . Zomig [Zolmitriptan] Shortness Of Breath  . Latex Hives and Swelling  . Ibuprofen Other (See Comments)    Can not take because of surgery (No NSAIDS)  . Sulfa Antibiotics Other (See Comments)    Unknown. Since childhood.     Prior to Admission medications   Medication Sig Start Date End Date Taking? Authorizing Provider  Calcium 600-400 MG-UNIT CHEW Chew 1 tablet by mouth daily.   Yes [provider]  FLUoxetine (PROZAC) 20 MG tablet Take 3 tablets (60 mg total) by mouth daily. 04/19/18  Yes Wardell Honour, MD  Multiple Vitamins-Minerals (BARIATRIC MULTIVITAMINS/IRON) CAPS Take 1 tablet by mouth daily.    Yes [provider]  topiramate (TOPAMAX)  100 MG tablet TAKE 2 TABLETS BY MOUTH TWICE DAILY Patient taking differently: Take 200 mg by mouth 2 (two) times daily.  01/26/18  Yes Wardell Honour, MD  traZODone (DESYREL) 50 MG tablet Take 1-2 tablets (50-100 mg total) by mouth at bedtime as needed. for sleep. Call the office to schedule appointment. 11/15/18  Yes McVey, Gelene Mink, PA-C  vitamin B-12 1000 MCG tablet Take 1 tablet (1,000 mcg total) by mouth daily. 12/13/16  Yes Barton Dubois, MD    Past Medical History:  Diagnosis Date  . Anemia   . Anxiety   . Arthritis    L knee OA  . Depression    history of depression in Dec 11, 2010 with death of mother  . Hyperlipidemia   . Hypertension   . Migraine   . Obesity 02/2017   has had gastric sleeve    Past Surgical History:  Procedure Laterality Date  . ABDOMINAL HYSTERECTOMY    . ANAL RECTAL MANOMETRY N/A 01/11/2018   Procedure: ANO RECTAL MANOMETRY;  Surgeon: Mauri Pole, MD;  [facility];  Service: Endoscopy;  Laterality: N/A;  . CARPAL TUNNEL RELEASE Bilateral   . CESAREAN SECTION     x 1  . CHOLECYSTECTOMY    . KNEE SURGERY Left   . LAPAROSCOPIC GASTRIC SLEEVE RESECTION N/A 02/17/2017   Procedure: LAPAROSCOPIC GASTRIC SLEEVE RESECTION, UPPER ENDO;  Surgeon: Johnathan Hausen, MD;  Location: [facility];  Service: General;  Laterality: N/A;  . LAPAROSCOPIC UNILATERAL SALPINGECTOMY Left 06/10/2014   Procedure: LAPAROSCOPIC Left SALPINGECTOMY, removal of ectopic pregnancy;  Surgeon: Guss Bunde, MD;  Location: [facility];  Service: Gynecology;  Laterality: Left;  . TUBAL LIGATION    . VAGINAL HYSTERECTOMY Bilateral 03/25/2018   Procedure: HYSTERECTOMY VAGINAL, RIGHT SALPINGECTOMY;  Surgeon: Eldred Manges, MD;  Location: [facility];  Service: Gynecology;  Laterality: Bilateral;  ERAS  . WISDOM TOOTH EXTRACTION      Social History   Tobacco Use  . Smoking status: Never Smoker  . Smokeless tobacco: Never Used  Substance Use Topics  . Alcohol  use: Yes    Comment: socially    Family History  Problem Relation Age of Onset  . Breast cancer Mother 45  . Cancer Mother   . Prostate cancer Father 55  . Colon polyps Father   . Breast cancer Maternal Aunt        dx in her 5s; BRCA neg  . Prostate cancer Paternal Uncle   . Diabetes Paternal Uncle   . Heart disease Maternal Grandmother   . Diabetes Paternal Grandmother   . Prostate cancer Paternal Grandfather   . Ovarian cancer Sister 29       found during her pregnancy  . Prostate cancer Paternal Uncle   . Prostate cancer Paternal Uncle   . Cancer Other   . Stroke Other   . Diabetes Other   . Coronary artery disease Other   . Colon cancer Neg Hx   . Esophageal cancer Neg Hx   . Rectal cancer Neg Hx     ROS Per hpi  OBJECTIVE:  Today's Vitals   12/24/18 0936  BP: 102/60  Pulse: 68  Temp: 97.8 F (36.6 C)  TempSrc: Oral  SpO2: 100%  Weight: 174 lb (78.9 kg)  Height: '5\' 2"'  (1.575 m)   Body mass index is 31.83 kg/m.   Physical Exam Vitals signs and nursing note reviewed.  Constitutional:      Appearance: She is well-developed. She is ill-appearing.  HENT:     Head: Normocephalic and atraumatic.     Right Ear: Hearing, tympanic membrane, ear canal and external ear normal.     Left Ear: Hearing, tympanic membrane, ear canal and external ear normal.  Eyes:     Conjunctiva/sclera: Conjunctivae normal.     Pupils: Pupils are equal, round, and reactive to light.  Neck:     Musculoskeletal: Neck supple.  Cardiovascular:     Rate and Rhythm: Normal rate and regular rhythm.     Heart sounds: Normal heart sounds. No murmur. No friction rub. No gallop.   Pulmonary:     Effort: Pulmonary effort is normal.     Breath sounds: Normal breath sounds. No wheezing or rales.  Abdominal:     General: Bowel sounds are normal. There is no distension.     Palpations: Abdomen is soft. There is no hepatomegaly or splenomegaly.     Tenderness: There is abdominal tenderness  in the right lower quadrant. There is no rebound.     Comments: Over mcburney point, no raditaion, equivocal obturator, neg psoas  Lymphadenopathy:     Cervical: No cervical adenopathy.  Skin:    General: Skin is warm and dry.  Neurological:     Mental Status: She is alert and oriented to person, place, and time.       Results for orders placed or performed in visit on 12/24/18 (from  the past 24 hour(s))  POC Influenza A&B(BINAX/QUICKVUE)     Status: None   Collection Time: 12/24/18  9:57 AM  Result Value Ref Range   Influenza A, POC Negative Negative   Influenza B, POC Negative Negative  Comprehensive metabolic panel     Status: Abnormal   Collection Time: 12/24/18 10:17 AM  Result Value Ref Range   Glucose 96 65 - 99 mg/dL   BUN 18 6 - 24 mg/dL   Creatinine, Ser 0.76 0.57 - 1.00 mg/dL   GFR calc non Af Amer 98 >59 mL/min/1.73   GFR calc Af Amer 113 >59 mL/min/1.73   BUN/Creatinine Ratio 24 (H) 9 - 23   Sodium 139 134 - 144 mmol/L   Potassium 4.1 3.5 - 5.2 mmol/L   Chloride 109 (H) 96 - 106 mmol/L   CO2 24 20 - 29 mmol/L   Calcium 8.7 8.7 - 10.2 mg/dL   Total Protein 6.0 6.0 - 8.5 g/dL   Albumin 4.1 3.8 - 4.8 g/dL   Globulin, Total 1.9 1.5 - 4.5 g/dL   Albumin/Globulin Ratio 2.2 1.2 - 2.2   Bilirubin Total 0.4 0.0 - 1.2 mg/dL   Alkaline Phosphatase 33 (L) 39 - 117 IU/L   AST 18 0 - 40 IU/L   ALT 26 0 - 32 IU/L   Narrative   Performed at:  [facility] [address]  [address], [place], [place]  [id number] Lab Director: Lindon Romp MD, Phone:  [phone]  CBC with Differential/Platelet     Status: None   Collection Time: 12/24/18 10:17 AM  Result Value Ref Range   WBC 4.5 3.4 - 10.8 x10E3/uL   RBC 4.48 3.77 - 5.28 x10E6/uL   Hemoglobin 12.8 11.1 - 15.9 g/dL   Hematocrit 37.5 34.0 - 46.6 %   MCV 84 79 - 97 fL   MCH 28.6 26.6 - 33.0 pg   MCHC 34.1 31.5 - 35.7 g/dL   RDW 14.1 11.7 - 15.4 %   Platelets 310 150 - 450 x10E3/uL   Neutrophils 58  Not Estab. %   Lymphs 31 Not Estab. %   Monocytes 8 Not Estab. %   Eos 2 Not Estab. %   Basos 1 Not Estab. %   Neutrophils Absolute 2.7 1.4 - 7.0 x10E3/uL   Lymphocytes Absolute 1.4 0.7 - 3.1 x10E3/uL   Monocytes Absolute 0.4 0.1 - 0.9 x10E3/uL   EOS (ABSOLUTE) 0.1 0.0 - 0.4 x10E3/uL   Basophils Absolute 0.0 0.0 - 0.2 x10E3/uL   Narrative   Performed at:  [facility], [place], [place]  [id number] Lab Director: Lindon Romp MD, Phone:  [phone]  POCT urinalysis dipstick     Status: None   Collection Time: 12/24/18 10:19 AM  Result Value Ref Range   Color, UA yellow yellow   Clarity, UA clear clear   Glucose, UA negative negative mg/dL   Bilirubin, UA negative negative   Ketones, POC UA negative negative mg/dL   Spec Grav, UA 1.025 1.010 - 1.025   Blood, UA negative negative   pH, UA 5.5 5.0 - 8.0   Protein Ur, POC negative negative mg/dL   Urobilinogen, UA 0.2 0.2 or 1.0 E.U./dL   Nitrite, UA Negative Negative   Leukocytes, UA Negative Negative    Dg Abd 2 Views  Result Date: 12/24/2018 CLINICAL DATA:  Fever, diarrhea, history of constipation EXAM: ABDOMEN - 2 VIEW COMPARISON:  11/22/2017 FINDINGS:  Nonobstructive pattern of bowel gas with gas present to the rectum. Moderate burden of stool in the left and right colon. Postoperative findings of prior sleeve gastrectomy and cholecystectomy. No free air in the abdomen. IMPRESSION: Nonobstructive pattern of bowel gas with gas present to the rectum. Moderate burden of stool in the left and right colon. Postoperative findings of prior sleeve gastrectomy and cholecystectomy. No free air in the abdomen. Electronically Signed   By: Eddie Candle M.D.   On: 12/24/2018 10:18     ASSESSMENT and PLAN  1. Fever, unspecified fever cause - POC Influenza A&B(BINAX/QUICKVUE) - Comprehensive metabolic panel  2. RLQ abdominal pain - POCT urinalysis dipstick - DG Abd 2 Views; Future - CBC with  Differential/Platelet - Comprehensive metabolic panel   Patient nontoxic appearing. Vital signs wnl. Normal CBC, appy less likely. Discussed supportive measures, treating as viral illness. Strict ER precautions given.   Return if symptoms worsen or fail to improve.    Rutherford Guys, MD Primary Care at [facility], [place] [address] Ph.  [phone] Fax [phone]

## 2018-12-30 ENCOUNTER — Ambulatory Visit: Payer: 59 | Admitting: Family Medicine

## 2019-01-02 ENCOUNTER — Telehealth: Payer: 59 | Admitting: Family

## 2019-01-02 DIAGNOSIS — K591 Functional diarrhea: Secondary | ICD-10-CM | POA: Diagnosis not present

## 2019-01-02 NOTE — Progress Notes (Signed)
Based on what you shared with me, I feel your condition warrants further evaluation and I recommend that you be seen for a face to face office visit.     NOTE: If you entered your credit card information for this eVisit, you will not be charged. You may see a "hold" on your card for the $35 but that hold will drop off and you will not have a charge processed.  If you are having a true medical emergency please call 911.  If you need an urgent face to face visit, [facility] has four urgent care centers for your convenience.    PLEASE NOTE: THE [facility] LOCATIONS AND URGENT CARE CLINICS DO NOT HAVE THE TESTING FOR CORONAVIRUS COVID19 AVAILABLE.  IF YOU FEEL YOU NEED THIS TEST YOU MUST GO TO A TRIAGE LOCATION AT ONE OF THE HOSPITAL EMERGENCY DEPARTMENTS   [email or web address] to reserve your spot online an avoid wait times  [facility] [address], [address] [place], [place] [address] 8 am to 8 pm Monday-Friday 10 am to 4 pm Saturday-Sunday *Across the street from [employer]  [facility]  [address] [place], [address] 8 am to 5 pm Monday-Friday * In the [facility]   The following sites will take your insurance:  . [facility]  [phone] Get Driving Directions Find a Provider at this Location  [address] [place], [place] [address] . 10 am to 8 pm Monday-Friday . 12 pm to 8 pm Saturday-Sunday   . [facility]  [phone] Get Driving Directions Find a Provider at this Location  [address], [address] [place], [place] [address] . 8 am to 8 pm Monday-Friday . 9 am to 6 pm Saturday . 11 am to 6 pm Sunday   . [facility] [place]  [phone] Get Driving Directions  [address] [place],  [address] . 8 am to 8 pm Monday-Friday . 8 am to 4 pm Saturday-Sunday   Your e-visit answers were  reviewed by a board certified advanced clinical practitioner to complete your personal care plan.  Thank you for using e-Visits. 

## 2019-01-03 ENCOUNTER — Emergency Department (HOSPITAL_COMMUNITY)
Admission: EM | Admit: 2019-01-03 | Discharge: 2019-01-03 | Disposition: A | Discharge status: Home / Self Care | Payer: 59 | Attending: Emergency Medicine | Admitting: Emergency Medicine

## 2019-01-03 ENCOUNTER — Other Ambulatory Visit: Payer: Self-pay

## 2019-01-03 ENCOUNTER — Encounter (HOSPITAL_COMMUNITY): Payer: Self-pay | Admitting: Emergency Medicine

## 2019-01-03 ENCOUNTER — Emergency Department (HOSPITAL_COMMUNITY): Payer: 59

## 2019-01-03 ENCOUNTER — Telehealth: Payer: 59 | Admitting: Family Medicine

## 2019-01-03 ENCOUNTER — Encounter: Payer: Self-pay | Admitting: Emergency Medicine

## 2019-01-03 ENCOUNTER — Ambulatory Visit
Admission: EM | Admit: 2019-01-03 | Discharge: 2019-01-03 | Disposition: A | Discharge status: Home / Self Care | Payer: 59 | Source: Home / Self Care

## 2019-01-03 DIAGNOSIS — I10 Essential (primary) hypertension: Secondary | ICD-10-CM | POA: Diagnosis not present

## 2019-01-03 DIAGNOSIS — R109 Unspecified abdominal pain: Secondary | ICD-10-CM | POA: Diagnosis not present

## 2019-01-03 DIAGNOSIS — Z79899 Other long term (current) drug therapy: Secondary | ICD-10-CM | POA: Insufficient documentation

## 2019-01-03 DIAGNOSIS — R1031 Right lower quadrant pain: Secondary | ICD-10-CM | POA: Diagnosis not present

## 2019-01-03 DIAGNOSIS — K2970 Gastritis, unspecified, without bleeding: Secondary | ICD-10-CM | POA: Diagnosis not present

## 2019-01-03 DIAGNOSIS — A084 Viral intestinal infection, unspecified: Secondary | ICD-10-CM | POA: Diagnosis not present

## 2019-01-03 DIAGNOSIS — Z91040 Latex allergy status: Secondary | ICD-10-CM | POA: Diagnosis not present

## 2019-01-03 DIAGNOSIS — R197 Diarrhea, unspecified: Secondary | ICD-10-CM | POA: Diagnosis not present

## 2019-01-03 LAB — COMPREHENSIVE METABOLIC PANEL
ALT: 52 U/L — ABNORMAL HIGH (ref 0–44)
ANION GAP: 8 (ref 5–15)
AST: 29 U/L (ref 15–41)
Albumin: 4.3 g/dL (ref 3.5–5.0)
Alkaline Phosphatase: 32 U/L — ABNORMAL LOW (ref 38–126)
BUN: 20 mg/dL (ref 6–20)
CO2: 24 mmol/L (ref 22–32)
Calcium: 9.2 mg/dL (ref 8.9–10.3)
Chloride: 109 mmol/L (ref 98–111)
Creatinine, Ser: 0.65 mg/dL (ref 0.44–1.00)
GFR calc Af Amer: >60 mL/min (ref >60)
GFR calc non Af Amer: >60 mL/min (ref >60)
Glucose, Bld: 97 mg/dL (ref 70–99)
POTASSIUM: 4 mmol/L (ref 3.5–5.1)
Sodium: 141 mmol/L (ref 135–145)
Total Bilirubin: 0.8 mg/dL (ref 0.3–1.2)
Total Protein: 7 g/dL (ref 6.5–8.1)

## 2019-01-03 LAB — POCT URINALYSIS DIP (MANUAL ENTRY)
Bilirubin, UA: NEGATIVE
Blood, UA: NEGATIVE
Glucose, UA: NEGATIVE mg/dL
Ketones, POC UA: NEGATIVE mg/dL
LEUKOCYTES UA: NEGATIVE
Nitrite, UA: NEGATIVE
PROTEIN UA: NEGATIVE mg/dL
Spec Grav, UA: >=1.03 — AB (ref 1.010–1.025)
Urobilinogen, UA: 0.2 E.U./dL
pH, UA: 5.5 (ref 5.0–8.0)

## 2019-01-03 LAB — CBC WITH DIFFERENTIAL/PLATELET
Abs Immature Granulocytes: 0.02 10*3/uL (ref 0.00–0.07)
BASOS PCT: 1 %
Basophils Absolute: 0.1 10*3/uL (ref 0.0–0.1)
Eosinophils Absolute: 0.1 10*3/uL (ref 0.0–0.5)
Eosinophils Relative: 1 %
HCT: 43.5 % (ref 36.0–46.0)
Hemoglobin: 14.1 g/dL (ref 12.0–15.0)
Immature Granulocytes: 0 %
Lymphocytes Relative: 25 %
Lymphs Abs: 1.9 10*3/uL (ref 0.7–4.0)
MCH: 28.5 pg (ref 26.0–34.0)
MCHC: 32.4 g/dL (ref 30.0–36.0)
MCV: 87.9 fL (ref 80.0–100.0)
Monocytes Absolute: 0.4 10*3/uL (ref 0.1–1.0)
Monocytes Relative: 6 %
NEUTROS PCT: 67 %
Neutro Abs: 5.2 10*3/uL (ref 1.7–7.7)
Platelets: 285 10*3/uL (ref 150–400)
RBC: 4.95 MIL/uL (ref 3.87–5.11)
RDW: 12.6 % (ref 11.5–15.5)
WBC: 7.7 10*3/uL (ref 4.0–10.5)
nRBC: 0 % (ref 0.0–0.2)

## 2019-01-03 LAB — HCG, SERUM, QUALITATIVE: Preg, Serum: NEGATIVE

## 2019-01-03 LAB — LIPASE, BLOOD: Lipase: 37 U/L (ref 11–51)

## 2019-01-03 MED ORDER — SODIUM CHLORIDE (PF) 0.9 % IJ SOLN
INTRAMUSCULAR | Status: AC
  Filled 2019-01-03: qty 50

## 2019-01-03 MED ORDER — ONDANSETRON HCL 4 MG/2ML IJ SOLN
4.0000 mg | Freq: Once | INTRAMUSCULAR | Status: DC
  Filled 2019-01-03: qty 2

## 2019-01-03 MED ORDER — SODIUM CHLORIDE 0.9 % IV BOLUS: 1000.0000 mL | Freq: Once | INTRAVENOUS | Status: DC

## 2019-01-03 MED ORDER — DICYCLOMINE HCL 10 MG PO CAPS
10.0000 mg | ORAL_CAPSULE | Freq: Once | ORAL | Status: DC
  Filled 2019-01-03: qty 1

## 2019-01-03 MED ORDER — IOHEXOL 300 MG/ML  SOLN
100.0000 mL | Freq: Once | INTRAMUSCULAR | Status: AC | PRN
  Administered 2019-01-03: 100 mL via INTRAVENOUS

## 2019-01-03 NOTE — ED Provider Notes (Signed)
[facility]    CSN: [id number] Arrival date & time: 01/03/19  16-Dec-1303     History   Chief Complaint No chief complaint on file.   HPI Tanya [name] is a 38 y.o. female.   Subjective:   Tanya [name] is a 38 y.o. female who presents for evaluation of abdominal pain. The pain is described as aching and sharp, and is 5/10 in intensity. Pain is located in the RLQ without radiation. Onset was about 10 days ago and symptoms have been gradually worsening since.   Aggravating factors: none.    Alleviating factors: none.   Associated symptoms: anorexia, chills, diarrhea, intermittent fevers with a TMAX of 101 (which improves with Tylenol, last dose about 1 hour PTA), headache, nausea, dizziness, palpitations, shortness of breath and generalized weakness.   The patient denies constipation, dysuria, hematochezia, melena, myalgias, sweats or vomiting.   Notably, the patient was evaluated by her PCP for similar complaints on 12/24/18. UA and FLU testing done that day were negative. CMP and CBC were ordered. CMP was unremarkable but CBC results are unknown. Patient underwent abdominal x-ray as well which was negative for any free air in the abdomen. It did reveal a nonobstructive pattern of bowel gas with gas present to the rectum, moderate burden of stool in the left and right colon and postoperative findings of prior sleeve gastrectomy and cholecystectomy.   Patient denies any known contact with a person diagnosed with or suspected to have had COVID-19. 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 She last worked on 12/24/18. Patient does not have a history of chronic lung disease or moderate to severe asthma, heart disease, severe obesity BMI > 40, other underlying poorly controlled chronic health conditions such as diabetes, renal failure, liver disease or immunocompromised status.   The following portions of the patient's  history were reviewed and updated as appropriate: allergies, current medications, past family history, past medical history, past social history, past surgical history and problem list.           Past Medical History:  Diagnosis Date  . Anemia   . Anxiety   . Arthritis    L knee OA  . Depression    history of depression in 12/15/2010 with death of mother  . Hyperlipidemia   . Hypertension   . Migraine   . Obesity 02/2017   has had gastric sleeve    Patient Active Problem List   Diagnosis Date Noted  . Menorrhagia 03/25/2018  . Nausea without vomiting 03/09/2018  . LUQ abdominal pain 03/09/2018  . Bowel habit changes 12/08/2017  . Bloating 12/08/2017  . Abdominal pain 09/04/2017  . Anal fissure 09/04/2017  . Constipation 09/04/2017  . S/P laparoscopic sleeve gastrectomy May 2018 02/17/2017  . Iron deficiency anemia due to chronic blood loss 02/01/2017  . SVT (supraventricular tachycardia) ([place])   . Anemia due to vitamin B12 deficiency 12/08/2016  . Menometrorrhagia 12/05/2016  . Morbid obesity ([place]) 12/05/2016  . Chronic migraine without aura without status migrainosus, not intractable 12/05/2016    Past Surgical History:  Procedure Laterality Date  . ABDOMINAL HYSTERECTOMY    . ANAL RECTAL MANOMETRY N/A 01/11/2018   Procedure: ANO RECTAL MANOMETRY;  Surgeon: Mauri Pole, MD;  [facility];  Service: Endoscopy;  Laterality: N/A;  . CARPAL TUNNEL RELEASE Bilateral   . CESAREAN SECTION     x 1  . CHOLECYSTECTOMY    .  KNEE SURGERY Left   . LAPAROSCOPIC GASTRIC SLEEVE RESECTION N/A 02/17/2017   Procedure: LAPAROSCOPIC GASTRIC SLEEVE RESECTION, UPPER ENDO;  Surgeon: Johnathan Hausen, MD;  [facility];  Service: General;  Laterality: N/A;  . LAPAROSCOPIC UNILATERAL SALPINGECTOMY Left 06/10/2014   Procedure: LAPAROSCOPIC Left SALPINGECTOMY, removal of ectopic pregnancy;  Surgeon: Guss Bunde, MD;  Location: [facility];  Service: Gynecology;  Laterality:  Left;  . TUBAL LIGATION    . VAGINAL HYSTERECTOMY Bilateral 03/25/2018   Procedure: HYSTERECTOMY VAGINAL, RIGHT SALPINGECTOMY;  Surgeon: Eldred Manges, MD;  Location: [facility];  Service: Gynecology;  Laterality: Bilateral;  ERAS  . WISDOM TOOTH EXTRACTION      OB History    Gravida  3   Para  2   Term  1   Preterm      AB      Living  2     SAB      TAB      Ectopic      Multiple      Live Births  2            Home Medications    Prior to Admission medications   Medication Sig Start Date End Date Taking? Authorizing Provider  Calcium 600-400 MG-UNIT CHEW Chew 1 tablet by mouth daily.    [provider]  FLUoxetine (PROZAC) 20 MG tablet Take 3 tablets (60 mg total) by mouth daily. 04/19/18   Wardell Honour, MD  Multiple Vitamins-Minerals (BARIATRIC MULTIVITAMINS/IRON) CAPS Take 1 tablet by mouth daily.     [provider]  topiramate (TOPAMAX) 100 MG tablet TAKE 2 TABLETS BY MOUTH TWICE DAILY Patient taking differently: Take 200 mg by mouth 2 (two) times daily.  01/26/18   Wardell Honour, MD  traZODone (DESYREL) 50 MG tablet Take 1-2 tablets (50-100 mg total) by mouth at bedtime as needed. for sleep. Call the office to schedule appointment. 11/15/18   McVey, Gelene Mink, PA-C  vitamin B-12 1000 MCG tablet Take 1 tablet (1,000 mcg total) by mouth daily. 12/13/16   Barton Dubois, MD    Family History Family History  Problem Relation Age of Onset  . Breast cancer Mother 45  . Cancer Mother   . Prostate cancer Father 1  . Colon polyps Father   . Breast cancer Maternal Aunt        dx in her 52s; BRCA neg  . Prostate cancer Paternal Uncle   . Diabetes Paternal Uncle   . Heart disease Maternal Grandmother   . Diabetes Paternal Grandmother   . Prostate cancer Paternal Grandfather   . Ovarian cancer Sister 65       found during her pregnancy  . Prostate cancer Paternal Uncle   . Prostate cancer Paternal Uncle   .  Cancer Other   . Stroke Other   . Diabetes Other   . Coronary artery disease Other   . Colon cancer Neg Hx   . Esophageal cancer Neg Hx   . Rectal cancer Neg Hx     Social History Social History   Tobacco Use  . Smoking status: Never Smoker  . Smokeless tobacco: Never Used  Substance Use Topics  . Alcohol use: Yes    Comment: socially  . Drug use: No     Allergies   Imitrex [sumatriptan]; Zomig [zolmitriptan]; Latex; Ibuprofen; and Sulfa antibiotics   Review of Systems Review of Systems  Constitutional: Positive for appetite change, chills and fever.  Respiratory: Positive for shortness of breath. Negative for cough.   Cardiovascular: Positive for palpitations. Negative for chest pain.  Gastrointestinal: Positive for abdominal pain, diarrhea and nausea. Negative for vomiting.  Genitourinary: Negative for dysuria.  Musculoskeletal: Negative for myalgias.  Neurological: Positive for dizziness, weakness and headaches.  All other systems reviewed and are negative.    Physical Exam Triage Vital Signs ED Triage Vitals [01/03/19 1315]  Enc Vitals Group     BP 117/80     Pulse Rate 90     Resp 18     Temp 98.2 F (36.8 C)     Temp Source Oral     SpO2 98 %     Weight      Height      Head Circumference      Peak Flow      Pain Score 5     Pain Loc      Pain Edu?      Excl. in [place]?    No data found.  Updated Vital Signs BP 117/80 (BP Location: Left Arm)   Pulse 90   Temp 98.2 F (36.8 C) (Oral) Comment: took tylenol approx 1 hour ago  Resp 18   LMP 03/12/2018 (Exact Date)   SpO2 98%   Visual Acuity Right Eye Distance:   Left Eye Distance:   Bilateral Distance:    Right Eye Near:   Left Eye Near:    Bilateral Near:     Physical Exam Vitals signs reviewed.  Constitutional:      General: She is not in acute distress.    Appearance: Normal appearance. She is ill-appearing. She is not toxic-appearing.  HENT:     Head: Normocephalic.  Neck:      Musculoskeletal: Normal range of motion.  Cardiovascular:     Rate and Rhythm: Normal rate and regular rhythm.  Pulmonary:     Effort: Pulmonary effort is normal.     Breath sounds: Normal breath sounds.  Abdominal:     General: Bowel sounds are normal. There is no distension.     Palpations: Abdomen is soft.     Tenderness: There is abdominal tenderness in the right lower quadrant. There is no right CVA tenderness or left CVA tenderness.  Musculoskeletal: Normal range of motion.  Lymphadenopathy:     Cervical: No cervical adenopathy.  Skin:    General: Skin is warm and dry.  Neurological:     General: No focal deficit present.     Mental Status: She is alert and oriented to person, place, and time.  Psychiatric:        Mood and Affect: Mood normal.        Behavior: Behavior normal.      UC Treatments / Results  Labs (all labs ordered are listed, but only abnormal results are displayed) Labs Reviewed  POCT URINALYSIS DIP (MANUAL ENTRY) - Abnormal; Notable for the following components:      Result Value   Clarity, UA cloudy (*)    Spec Grav, UA >=1.030 (*)    All other components within normal limits    EKG None  Radiology No results found.  Procedures Procedures (including critical care time)  Medications Ordered in UC Medications - No data to display  Initial Impression / Assessment and Plan / UC Course  I have reviewed the triage vital signs and the nursing notes.  Pertinent labs & imaging results that were available during my care of the patient were reviewed by  me and considered in my medical decision making (see chart for details).     38 year old female with a history of sleeve gastrectomy and cholecystectomy that presents with a ten-day history of RLQ abdominal pain, anorexia, chills, diarrhea, intermittent fevers, headache, nausea, dizziness, palpitations, shortness of breath and generalized weakness. Patient is afebrile but admits to taking tylenol  about 1 hour PTA to clinic. She is acutely ill-appearing but is in no acute distress. Physical exam reveals tenderness in the RLQ but otherwise benign. 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 Based on patient's worsening symptoms with normal UA, CMP and abdominal x-ray done by PCP on 12/24/18, I recommend that patient go to the ED for further evaluation. Patient comfortable with plan and disposition.  Patient has a clear mental status at this time, good insight into illness and has clear judgment to make decisions regarding their care.  Documentation was completed with the aid of voice recognition software. Transcription may contain typographical errors.   Final Clinical Impressions(s) / UC Diagnoses   Final diagnoses:  Right lower quadrant abdominal pain     Discharge Instructions     You have had this pain and other symptoms for over 10 days and not getting any better. Your urine test was normal. The x-ray and comprehensive metabolic panel done at your PCP office was also normal. Based on your symptoms, I think that you should go to the ED now for further evaluation and management of your symptoms. Continue to wear your mask while you are in the ED. Do not eat or drink anything until the ED staff tells you that it's ok.     ED Prescriptions    None     Controlled Substance Prescriptions [place] Controlled Substance Registry consulted? Not Applicable   Enrique Sack, [place] 01/03/19 1415

## 2019-01-03 NOTE — Discharge Instructions (Signed)
Your laboratory results were within normal limits.  Your CT was also within normal limits.  Please follow-up with your primary care physician.

## 2019-01-03 NOTE — ED Triage Notes (Signed)
Pt reports for about 10 days had lower abd pains with nausea and diarrhea. Saw PCP and had a xray and then was sent to UC today who did UA and was advised to go to ED.

## 2019-01-03 NOTE — Progress Notes (Signed)
reoccuring fever grade fever, still having the same symptoms that she did when she came on the 3/20. Having nausea and has not been able to go bk to work due to the fever. Hx of svt, gets dizzy when she stands. She says she does not feel like herself. Diagnosed with svt in 2018, in the hospital for this for 1 wk no follow up since then. Needs refill on prozac and trazodone

## 2019-01-03 NOTE — ED Notes (Signed)
Patient able to ambulate independently  

## 2019-01-03 NOTE — Discharge Instructions (Signed)
You have had this pain and other symptoms for over 10 days and not getting any better. Your urine test was normal. The x-ray and comprehensive metabolic panel done at your PCP office was also normal. Based on your symptoms, I think that you should go to the ED now for further evaluation and management of your symptoms. Continue to wear your mask while you are in the ED. Do not eat or drink anything until the ED staff tells you that it's ok.

## 2019-01-03 NOTE — ED Triage Notes (Signed)
Pt presents to [facility] for assessment of low-grade fevers (<101) since 3/20.  Pt states she has had RLQ pain, worsening with palpation.  Pt c/o dizziness with standing, and feeling "spaced out".  Pt c/o nausea, and diarrhea.  Pt c/o headaches, Pt has a hx of hysterectomy, gastric sleeve.  Pt states she has been gaining weight since the 20th as well.

## 2019-01-03 NOTE — ED Provider Notes (Signed)
[place] DEPT Provider Note   CSN: [id number] Arrival date & time: 01/03/19  1423    History   Chief Complaint Chief Complaint  Patient presents with   Abdominal Pain   Nausea   Diarrhea    HPI CLIFFORD COUDRIET is a 38 y.o. female.     38 y.o female with a PMH of HTN, Obesity, HLD reports resolution after bariatric surgery.  Patient reports she has had diarrhea, fever x 10 days.  She reports multiple episodes of diarrhea which had now subsided to every other day, she states she was seen by her PCP on 20 March who obtain an x-ray for her to rule out any abdominal process, the x-ray was not specific.  She reports since her symptoms started she has seen improvement in the diarrhea but reports she has been unable to keep her fevers down, she reports low-grade temp with T-max of 101.  She has taken Tylenol for the symptoms which helps with her fever.  She reports most of her fevers, at night.  Patient does endorse some dizziness, headache, shortness of breath with walking, reports while ambulating into the ED she was getting winded while walking from her car.  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  Patient also had a telehealth visit along with urgent care visit where she was referred to the ED for further evaluation of her abdominal pain.  She has not been working since 11 days ago.  She denies any chest pain, cough, vomiting.     Past Medical History:  Diagnosis Date   Anemia    Anxiety    Arthritis    L knee OA   Depression    history of depression in November 28, 2010 with death of mother   Hyperlipidemia    Hypertension    Migraine    Obesity 02/2017   has had gastric sleeve    Patient Active Problem List   Diagnosis Date Noted   Menorrhagia 03/25/2018   Nausea without vomiting 03/09/2018   LUQ abdominal pain 03/09/2018   Bowel  habit changes 12/08/2017   Bloating 12/08/2017   Abdominal pain 09/04/2017   Anal fissure 09/04/2017   Constipation 09/04/2017   S/P laparoscopic sleeve gastrectomy May 2018 02/17/2017   Iron deficiency anemia due to chronic blood loss 02/01/2017   SVT (supraventricular tachycardia) ([place])    Anemia due to vitamin B12 deficiency 12/08/2016   Menometrorrhagia 12/05/2016   Morbid obesity ([place]) 12/05/2016   Chronic migraine without aura without status migrainosus, not intractable 12/05/2016    Past Surgical History:  Procedure Laterality Date   ABDOMINAL HYSTERECTOMY     ANAL RECTAL MANOMETRY N/A 01/11/2018   Procedure: ANO RECTAL MANOMETRY;  Surgeon: Mauri Pole, MD;  Location: [facility];  Service: Endoscopy;  Laterality: N/A;   CARPAL TUNNEL RELEASE Bilateral    CESAREAN SECTION     x 1   CHOLECYSTECTOMY     KNEE SURGERY Left    LAPAROSCOPIC GASTRIC SLEEVE RESECTION N/A 02/17/2017   Procedure: LAPAROSCOPIC GASTRIC SLEEVE RESECTION, UPPER ENDO;  Surgeon: Johnathan Hausen, MD;  Location: [facility]S;  Service: General;  Laterality: N/A;   LAPAROSCOPIC UNILATERAL SALPINGECTOMY Left 06/10/2014   Procedure: LAPAROSCOPIC Left SALPINGECTOMY, removal of ectopic pregnancy;  Surgeon: Guss Bunde, MD;  Location: [facility];  Service: Gynecology;  Laterality: Left;   TUBAL LIGATION  VAGINAL HYSTERECTOMY Bilateral 03/25/2018   Procedure: HYSTERECTOMY VAGINAL, RIGHT SALPINGECTOMY;  Surgeon: Eldred Manges, MD;  Location: [place];  Service: Gynecology;  Laterality: Bilateral;  ERAS   WISDOM TOOTH EXTRACTION       OB History    Gravida  3   Para  2   Term  1   Preterm      AB      Living  2     SAB      TAB      Ectopic      Multiple      Live Births  2            Home Medications    Prior to Admission medications   Medication Sig Start Date End Date Taking? Authorizing Provider  acetaminophen (TYLENOL) 500 MG tablet  Take 500 mg by mouth daily as needed for moderate pain or fever.   Yes [provider]  Calcium 600-400 MG-UNIT CHEW Chew 1 tablet by mouth daily.   Yes [provider]  FLUoxetine (PROZAC) 20 MG tablet Take 3 tablets (60 mg total) by mouth daily. 04/19/18  Yes Wardell Honour, MD  Multiple Vitamins-Minerals (BARIATRIC MULTIVITAMINS/IRON) CAPS Take 1 tablet by mouth daily.    Yes [provider]  topiramate (TOPAMAX) 100 MG tablet TAKE 2 TABLETS BY MOUTH TWICE DAILY Patient taking differently: Take 250 mg by mouth 2 (two) times daily.  01/26/18  Yes Wardell Honour, MD  traZODone (DESYREL) 50 MG tablet Take 1-2 tablets (50-100 mg total) by mouth at bedtime as needed. for sleep. Call the office to schedule appointment. 11/15/18  Yes McVey, Gelene Mink, PA-C  vitamin B-12 1000 MCG tablet Take 1 tablet (1,000 mcg total) by mouth daily. 12/13/16  Yes Barton Dubois, MD    Family History Family History  Problem Relation Age of Onset   Breast cancer Mother 46   Cancer Mother    Prostate cancer Father 61   Colon polyps Father    Breast cancer Maternal Aunt        dx in her 47s; BRCA neg   Prostate cancer Paternal Uncle    Diabetes Paternal Uncle    Heart disease Maternal Grandmother    Diabetes Paternal Grandmother    Prostate cancer Paternal Grandfather    Ovarian cancer Sister 85       found during her pregnancy   Prostate cancer Paternal Uncle    Prostate cancer Paternal Uncle    Cancer Other    Stroke Other    Diabetes Other    Coronary artery disease Other    Colon cancer Neg Hx    Esophageal cancer Neg Hx    Rectal cancer Neg Hx     Social History Social History   Tobacco Use   Smoking status: Never Smoker   Smokeless tobacco: Never Used  Substance Use Topics   Alcohol use: Yes    Comment: socially   Drug use: No     Allergies   Imitrex [sumatriptan]; Zomig [zolmitriptan]; Latex; Ibuprofen; and Sulfa  antibiotics   Review of Systems Review of Systems  Constitutional: Positive for fever. Negative for chills.  HENT: Negative for ear pain and sore throat.   Eyes: Negative for pain and visual disturbance.  Respiratory: Negative for cough and shortness of breath.   Cardiovascular: Negative for chest pain and palpitations.  Gastrointestinal: Positive for abdominal pain and diarrhea. Negative for vomiting.  Genitourinary: Negative for dysuria and  hematuria.  Musculoskeletal: Negative for arthralgias and back pain.  Skin: Negative for color change and rash.  Neurological: Negative for seizures and syncope.  All other systems reviewed and are negative.    Physical Exam Updated Vital Signs BP 126/87 (BP Location: Left Arm)    Pulse 73    Temp 98.3 F (36.8 C) (Oral)    Resp 19    LMP 03/12/2018 (Exact Date)    SpO2 100%   Physical Exam Vitals signs and nursing note reviewed.  Constitutional:      General: She is not in acute distress.    Appearance: She is well-developed.  HENT:     Head: Normocephalic and atraumatic.     Mouth/Throat:     Pharynx: No oropharyngeal exudate.  Eyes:     Pupils: Pupils are equal, round, and reactive to light.  Neck:     Musculoskeletal: Normal range of motion.  Cardiovascular:     Rate and Rhythm: Regular rhythm.     Heart sounds: Normal heart sounds.  Pulmonary:     Effort: Pulmonary effort is normal. No respiratory distress.     Breath sounds: Normal breath sounds.  Abdominal:     General: Bowel sounds are normal. There is no distension.     Palpations: Abdomen is soft.     Tenderness: There is abdominal tenderness in the right lower quadrant and suprapubic area. There is no right CVA tenderness, left CVA tenderness or guarding. Negative signs include Murphy's sign, Rovsing's sign and McBurney's sign.  Musculoskeletal:        General: No tenderness or deformity.     Right lower leg: No edema.     Left lower leg: No edema.  Skin:     General: Skin is warm and dry.  Neurological:     Mental Status: She is alert and oriented to person, place, and time.      ED Treatments / Results  Labs (all labs ordered are listed, but only abnormal results are displayed) Labs Reviewed  COMPREHENSIVE METABOLIC PANEL - Abnormal; Notable for the following components:      Result Value   ALT 52 (*)    Alkaline Phosphatase 32 (*)    All other components within normal limits  CBC WITH DIFFERENTIAL/PLATELET  LIPASE, BLOOD  HCG, SERUM, QUALITATIVE  URINALYSIS, ROUTINE W REFLEX MICROSCOPIC    EKG None  Radiology Dg Chest 2 View  Result Date: 01/03/2019 CLINICAL DATA:  Abdominal pain.  Possible appendicitis. EXAM: CHEST - 2 VIEW COMPARISON:  None. FINDINGS: The heart size and mediastinal contours are within normal limits. Both lungs are clear. The visualized skeletal structures are unremarkable. IMPRESSION: Negative two view chest x-ray Electronically Signed   By: San Morelle M.D.   On: 01/03/2019 15:44   Ct Abdomen Pelvis W Contrast  Result Date: 01/03/2019 CLINICAL DATA:  Right lower quadrant pain with nausea and diarrhea 10 days. EXAM: CT ABDOMEN AND PELVIS WITH CONTRAST TECHNIQUE: Multidetector CT imaging of the abdomen and pelvis was performed using the standard protocol following bolus administration of intravenous contrast. CONTRAST:  1100m OMNIPAQUE IOHEXOL 300 MG/ML  SOLN COMPARISON:  11/30/2018 and 03/19/2018 FINDINGS: Lower chest: Lung bases are normal. Hepatobiliary: Previous cholecystectomy. Liver and biliary tree are normal. Pancreas: Normal. Spleen: Normal. Adrenals/Urinary Tract: Adrenal glands are normal. Kidneys are normal in size without hydronephrosis or nephrolithiasis. Ureters and bladder are normal. Stomach/Bowel: Evidence of previous gastric bypass surgery. Small bowel is normal. Appendix is normal. Colon is normal. Vascular/Lymphatic: Minimal  calcified plaque over the infrarenal abdominal aorta. No  adenopathy. Reproductive: Previous hysterectomy. Other: Tiny amount of free fluid in the pelvis. No focal inflammatory change. No free peritoneal air. Musculoskeletal: No acute findings. IMPRESSION: No acute findings in the abdomen/pelvis. Postsurgical changes as described. Tiny amount of nonspecific free fluid over the pelvis. Aortic Atherosclerosis (ICD10-I70.0). Electronically Signed   By: Marin Olp M.D.   On: 01/03/2019 17:32    Procedures Procedures (including critical care time)  Medications Ordered in ED Medications  sodium chloride (PF) 0.9 % injection (has no administration in time range)  iohexol (OMNIPAQUE) 300 MG/ML solution 100 mL (100 mLs Intravenous Contrast Given 01/03/19 1700)     Initial Impression / Assessment and Plan / ED Course  I have reviewed the triage vital signs and the nursing notes.  Pertinent labs & imaging results that were available during my care of the patient were reviewed by me and considered in my medical decision making (see chart for details).       Patient with a previous history of bariatric surgery presents to the ED with diarrhea, abdominal pain x10 days.  Seen by PCP along with urgent care and sent to ED for further evaluation.  X-ray was obtained at PCPs office which showed no specificity on appendicitis.  Patient reports the diarrhea has seemed to improve and is now every other day, she does report a fever for the past 10 days low-grade along the 100s.  She is also endorsing some shortness of breath which is new today reports she was getting winded while walking to the emergency department today. During evaluation patient is well-appearing, there is mild tenderness along the suprapubic and right lower quadrant area, suspicion for appendicitis.  Will obtain basic screening labs.  We will also order chest x-ray to rule out any acute process. CMP showed no electrolyte abnormality, LFTs are somewhat normal with a slight increase on ALT, slight  decrease on alk phos.  Lipase is within normal limits, low suspicion for pancreatitis.  Patient's PCP along with urgent care had some concern for appendicitis as the appendix was not visualized on x-ray. CT abdomen and pelvis showed: No acute findings in the abdomen/pelvis.    Postsurgical changes as described.    Tiny amount of nonspecific free fluid over the pelvis.     I had order bolus, Zofran, Bentyl for patient symptomatic treatment.  After 1 hour RN staff unable to provide this for patient.  Shared decision conversation with patient where she reports she would like to head home at this time.  Her symptoms have unchanged.  She will need a note to remain at home until her fevers have resolved.  Patient understands and agrees with management of this time.  Return precautions provided at length.  She was also provided with a work note to return back to work on April 6.  Final Clinical Impressions(s) / ED Diagnoses   Final diagnoses:  Viral gastritis    ED Discharge Orders    None       Janeece Fitting, PA-C 01/03/19 1827    Malvin Johns, MD 01/07/19 [phone]

## 2019-01-09 IMAGING — CR DG CHEST 2V
2 series · 2 of 2 positions shown · non-contrast
Comparison: 06/27/2012

CLINICAL DATA: Preop bariatric surgery.

EXAM:
CHEST  2 VIEW

[w chest pa]
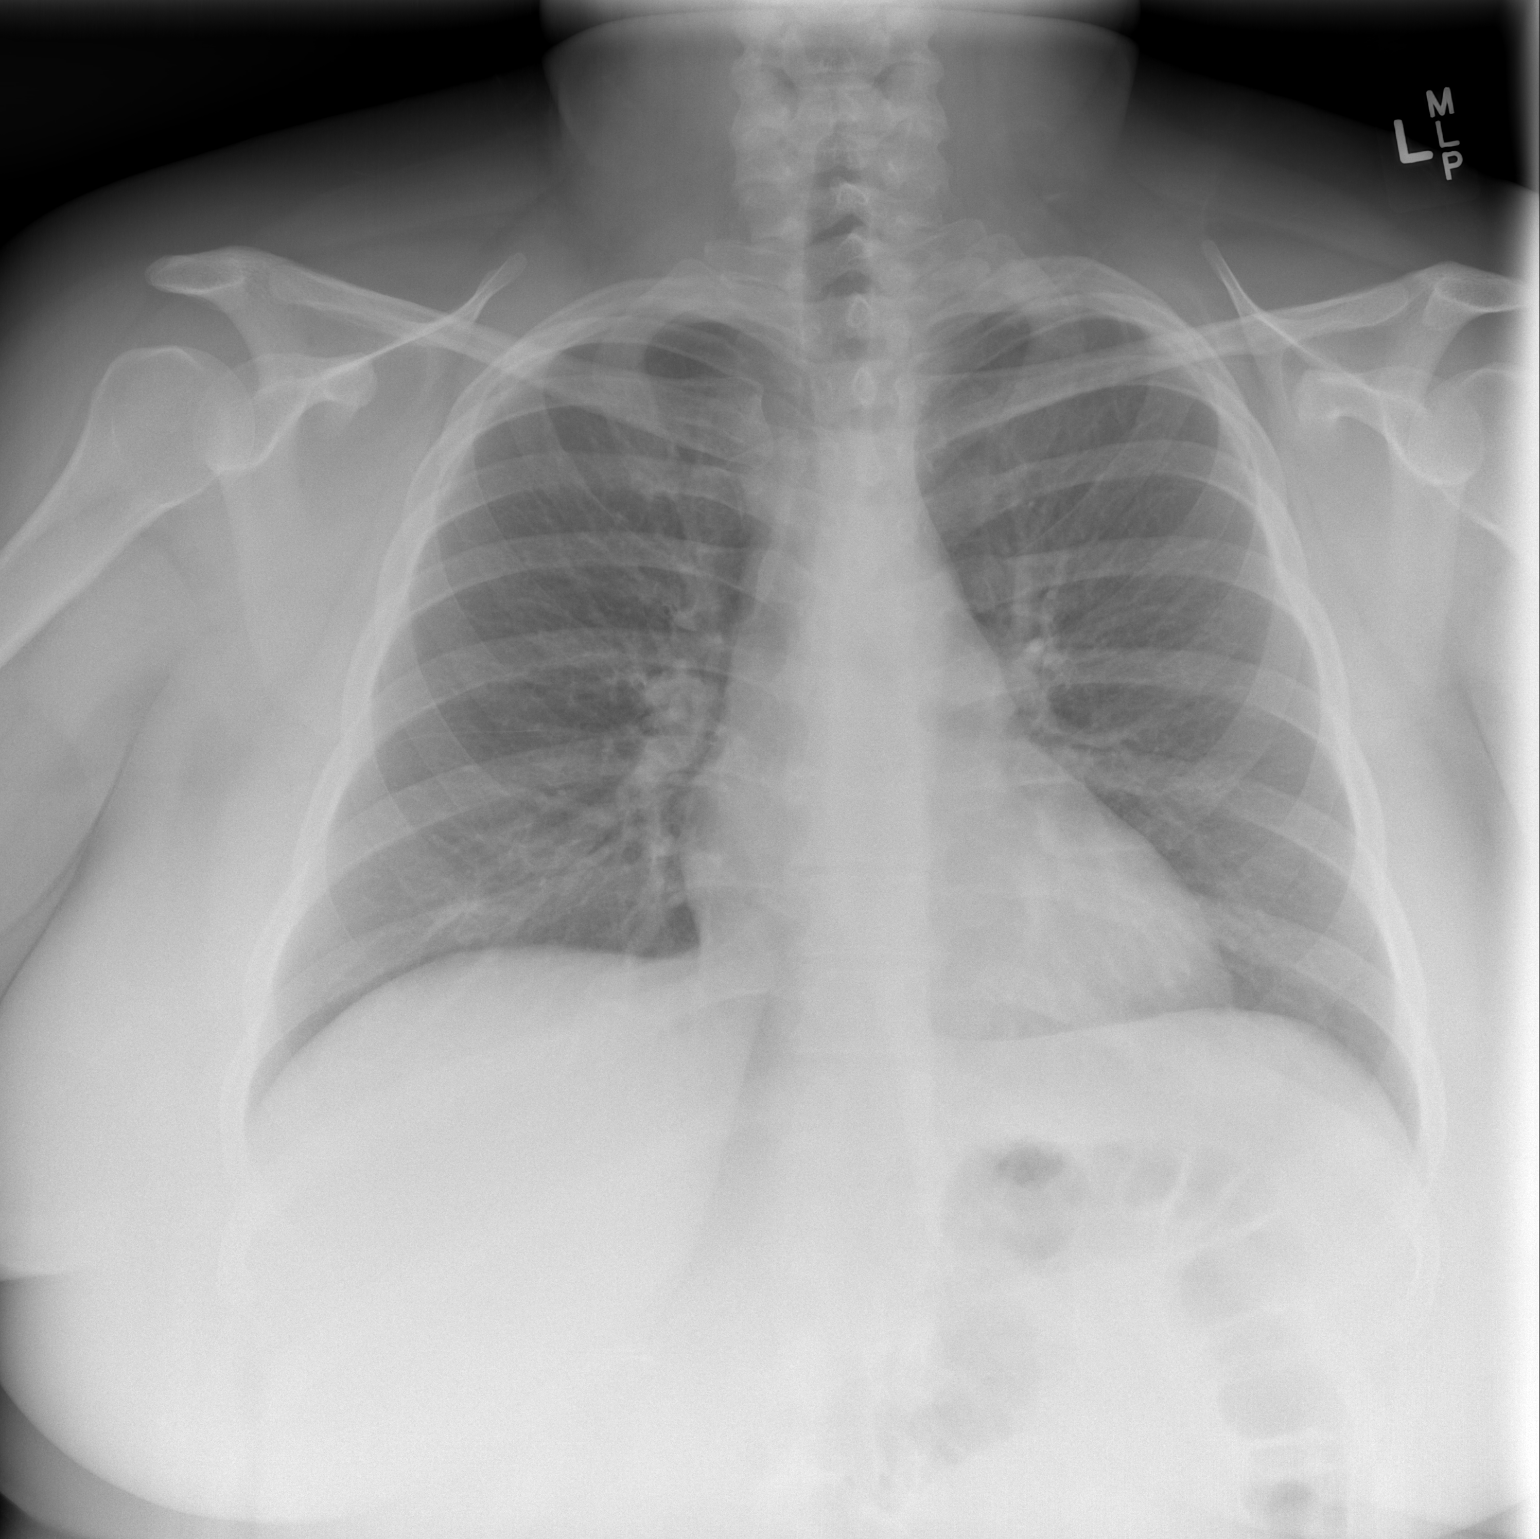

[w chest lat]
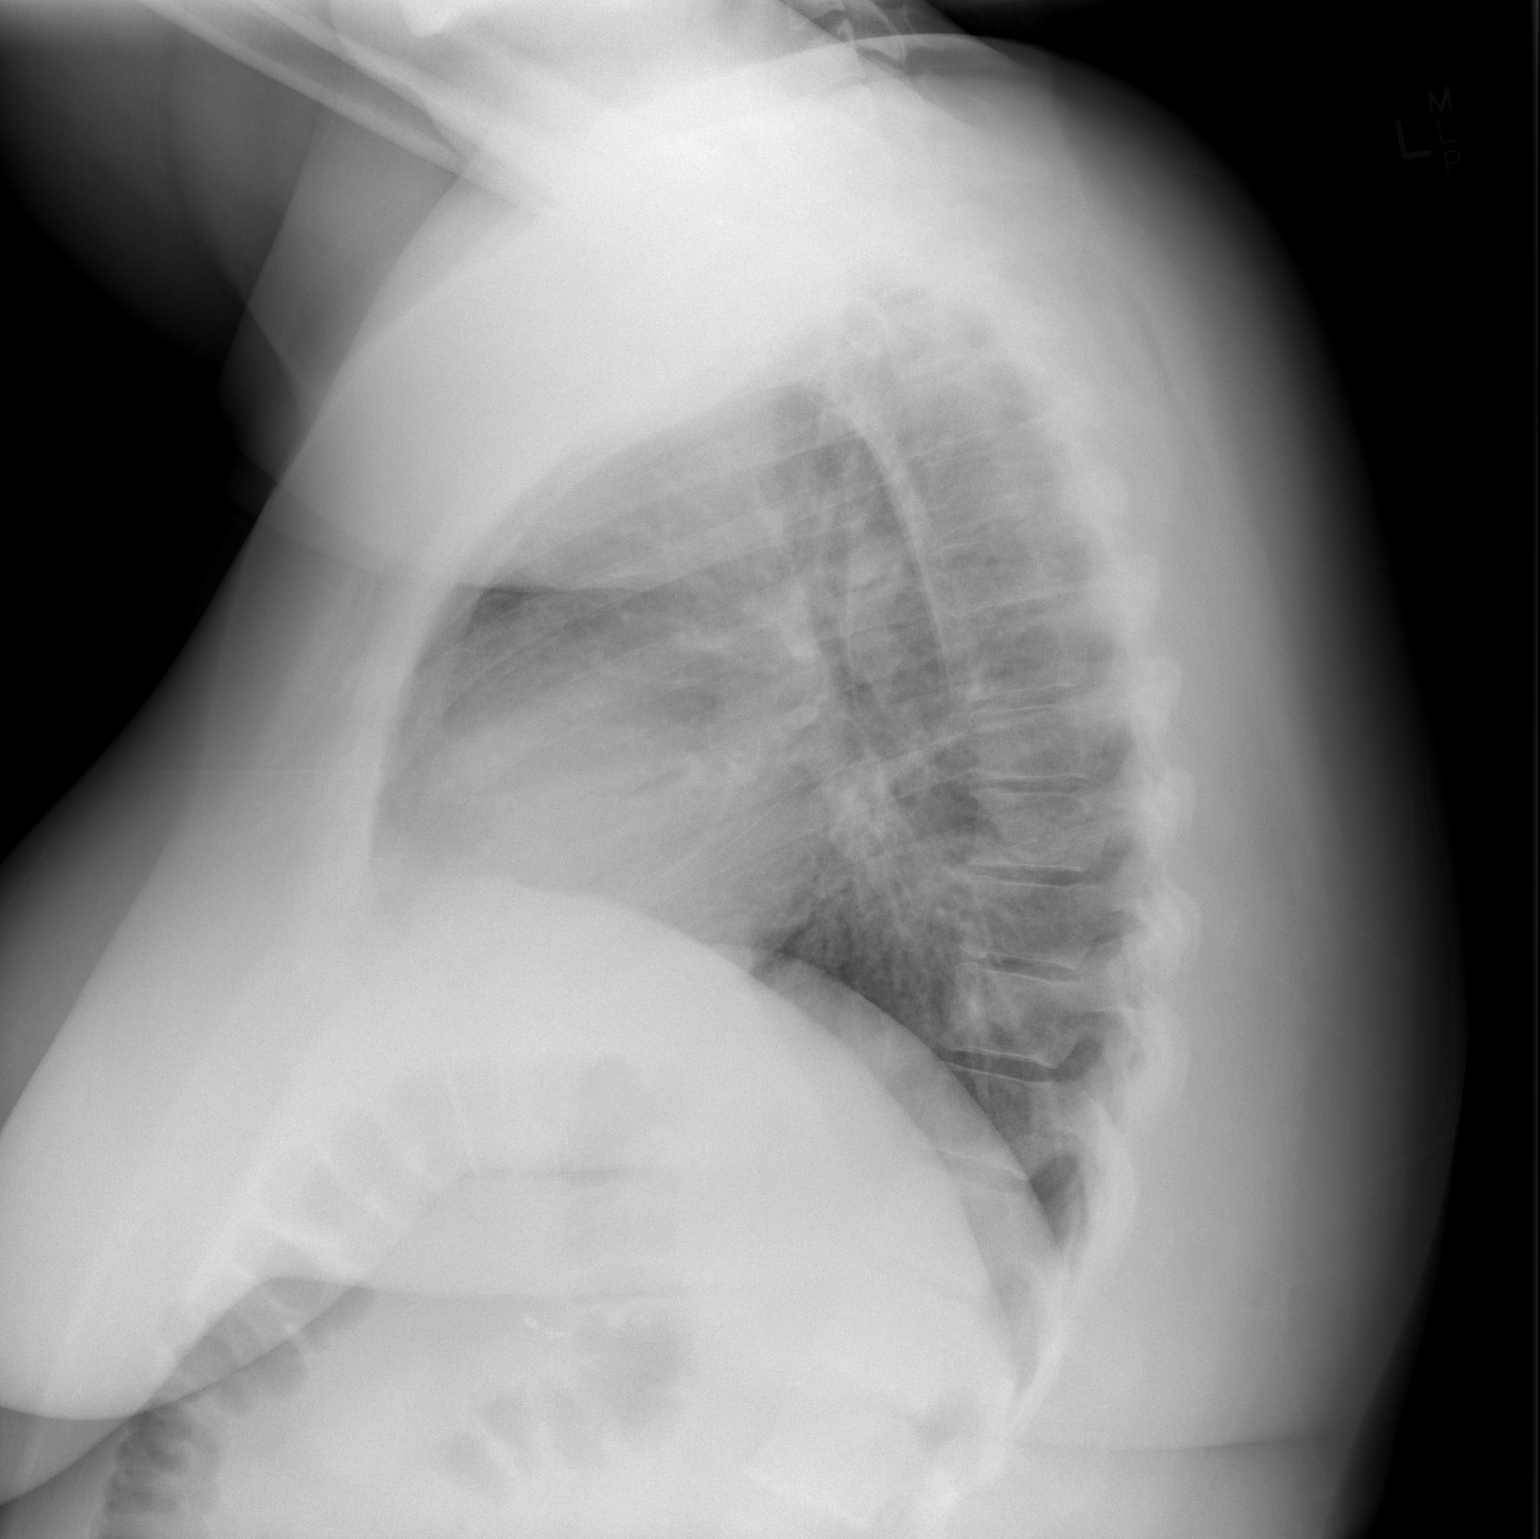

[2 of 2 positions shown; findings below may reference images not displayed]

FINDINGS: The heart size and mediastinal contours are within normal limits.
Both lungs are clear. The visualized skeletal structures are
unremarkable.
IMPRESSION: No active cardiopulmonary disease.

## 2019-01-10 ENCOUNTER — Encounter: Payer: Self-pay | Admitting: Family Medicine

## 2019-01-13 ENCOUNTER — Encounter: Payer: Self-pay | Admitting: Family Medicine

## 2019-01-17 DIAGNOSIS — R109 Unspecified abdominal pain: Secondary | ICD-10-CM | POA: Diagnosis not present

## 2019-01-18 ENCOUNTER — Other Ambulatory Visit: Payer: Self-pay | Admitting: Physician Assistant

## 2019-01-18 DIAGNOSIS — G4700 Insomnia, unspecified: Secondary | ICD-10-CM

## 2019-01-19 ENCOUNTER — Ambulatory Visit: Payer: Self-pay | Admitting: Surgery

## 2019-01-19 ENCOUNTER — Other Ambulatory Visit: Payer: Self-pay | Admitting: Surgery

## 2019-01-19 DIAGNOSIS — E869 Volume depletion, unspecified: Secondary | ICD-10-CM | POA: Insufficient documentation

## 2019-01-19 DIAGNOSIS — R1013 Epigastric pain: Secondary | ICD-10-CM | POA: Diagnosis not present

## 2019-01-19 MED ORDER — SODIUM CHLORIDE 0.9 % IV BOLUS: 2000.0000 mL | Freq: Once | INTRAVENOUS | Status: DC

## 2019-01-19 MED ORDER — SODIUM CHLORIDE 0.9 % IV SOLN: INTRAVENOUS | Status: DC

## 2019-01-19 MED FILL — PANTOPRAZOLE SOD DR 40 MG T: 40 | 30 days supply | Qty: 30 | Fill #0

## 2019-01-19 MED FILL — TOPIRAMATE 100 MG TABLET: 100 | 30 days supply | Qty: 150 | Fill #3

## 2019-01-19 MED FILL — traZODone HCL 50 MG TABS: 50 | 45 days supply | Qty: 90 | Fill #0

## 2019-01-19 MED FILL — SUCRALFATE 1 GM/10ML SUSP: 1 | 30 days supply | Qty: 600 | Fill #0

## 2019-01-20 ENCOUNTER — Ambulatory Visit (HOSPITAL_COMMUNITY)
Admission: RE | Admit: 2019-01-20 | Discharge: 2019-01-20 | Disposition: A | Discharge status: Home / Self Care | Payer: 59 | Source: Ambulatory Visit | Attending: Surgery | Admitting: Surgery

## 2019-01-20 ENCOUNTER — Other Ambulatory Visit: Payer: Self-pay

## 2019-01-20 DIAGNOSIS — E860 Dehydration: Secondary | ICD-10-CM | POA: Diagnosis not present

## 2019-01-20 MED ORDER — SODIUM CHLORIDE 0.9 % IV BOLUS
2000.0000 mL | Freq: Once | INTRAVENOUS | Status: AC
  Administered 2019-01-20: 1000 mL via INTRAVENOUS

## 2019-01-20 NOTE — Progress Notes (Signed)
PATIENT CARE CENTER NOTE  Diagnosis: Dehydration   Provider: Dr. Johnathan Hausen   Procedure: 2 Liter bolus of 0.9% Sodium Chloride    Note: Patient received a 2 liter fluid bolus over 4 hours. Tolerated bolus well. No adverse reaction noted. Vital signs stable. Discharge instructions given. Patient alert, oriented and ambulatory at discharge.

## 2019-01-20 NOTE — Discharge Instructions (Signed)
Dehydration, Adult  Dehydration is when there is not enough fluid or water in your body. This happens when you lose more fluids than you take in. Dehydration can range from mild to very bad. It should be treated right away to keep it from getting very bad. Symptoms of mild dehydration may include:  Thirst.  Dry lips.  Slightly dry mouth.  Dry, warm skin.  Dizziness. Symptoms of moderate dehydration may include:  Very dry mouth.  Muscle cramps.  Dark pee (urine). Pee may be the color of tea.  Your body making less pee.  Your eyes making fewer tears.  Heartbeat that is uneven or faster than normal (palpitations).  Headache.  Light-headedness, especially when you stand up from sitting.  Fainting (syncope). Symptoms of very bad dehydration may include:  Changes in skin, such as: ? Cold and clammy skin. ? Blotchy (mottled) or pale skin. ? Skin that does not quickly return to normal after being lightly pinched and let go (poor skin turgor).  Changes in body fluids, such as: ? Feeling very thirsty. ? Your eyes making fewer tears. ? Not sweating when body temperature is high, such as in hot weather. ? Your body making very little pee.  Changes in vital signs, such as: ? Weak pulse. ? Pulse that is more than 100 beats a minute when you are sitting still. ? Fast breathing. ? Low blood pressure.  Other changes, such as: ? Sunken eyes. ? Cold hands and feet. ? Confusion. ? Lack of energy (lethargy). ? Trouble waking up from sleep. ? Short-term weight loss. ? Unconsciousness. Follow these instructions at home:   If told by your doctor, drink an ORS: ? Make an ORS by using instructions on the package. ? Start by drinking small amounts, about  cup (120 mL) every 5-10 minutes. ? Slowly drink more until you have had the amount that your doctor said to have.  Drink enough clear fluid to keep your pee clear or pale yellow. If you were told to drink an ORS, finish the  ORS first, then start slowly drinking clear fluids. Drink fluids such as: ? Water. Do not drink only water by itself. Doing that can make the salt (sodium) level in your body get too low (hyponatremia). ? Ice chips. ? Fruit juice that you have added water to (diluted). ? Low-calorie sports drinks.  Avoid: ? Alcohol. ? Drinks that have a lot of sugar. These include high-calorie sports drinks, fruit juice that does not have water added, and soda. ? Caffeine. ? Foods that are greasy or have a lot of fat or sugar.  Take over-the-counter and prescription medicines only as told by your doctor.  Do not take salt tablets. Doing that can make the salt level in your body get too high (hypernatremia).  Eat foods that have minerals (electrolytes). Examples include bananas, oranges, potatoes, tomatoes, and spinach.  Keep all follow-up visits as told by your doctor. This is important. Contact a doctor if:  You have belly (abdominal) pain that: ? Gets worse. ? Stays in one area (localizes).  You have a rash.  You have a stiff neck.  You get angry or annoyed more easily than normal (irritability).  You are more sleepy than normal.  You have a harder time waking up than normal.  You feel: ? Weak. ? Dizzy. ? Very thirsty.  You have peed (urinated) only a small amount of very dark pee during 6-8 hours. Get help right away if:  You have   symptoms of very bad dehydration.  You cannot drink fluids without throwing up (vomiting).  Your symptoms get worse with treatment.  You have a fever.  You have a very bad headache.  You are throwing up or having watery poop (diarrhea) and it: ? Gets worse. ? Does not go away.  You have blood or something green (bile) in your throw-up.  You have blood in your poop (stool). This may cause poop to look black and tarry.  You have not peed in 6-8 hours.  You pass out (faint).  Your heart rate when you are sitting still is more than 100 beats a  minute.  You have trouble breathing. This information is not intended to replace advice given to you by your health care provider. Make sure you discuss any questions you have with your health care provider. Document Released: 07/19/2009 Document Revised: 04/11/2016 Document Reviewed: 11/16/2015 Elsevier Interactive Patient Education  2019 [employer]  

## 2019-01-24 NOTE — Telephone Encounter (Signed)
Forms faxed successfully  12;03pm 01/24/2019

## 2019-01-24 NOTE — Telephone Encounter (Signed)
Please investigate. thanks

## 2019-02-01 DIAGNOSIS — Z9884 Bariatric surgery status: Secondary | ICD-10-CM | POA: Diagnosis not present

## 2019-02-10 ENCOUNTER — Telehealth (INDEPENDENT_AMBULATORY_CARE_PROVIDER_SITE_OTHER): Payer: 59 | Admitting: Family Medicine

## 2019-02-10 ENCOUNTER — Encounter: Payer: Self-pay | Admitting: Family Medicine

## 2019-02-10 ENCOUNTER — Other Ambulatory Visit: Payer: Self-pay

## 2019-02-10 DIAGNOSIS — F411 Generalized anxiety disorder: Secondary | ICD-10-CM

## 2019-02-10 DIAGNOSIS — F5102 Adjustment insomnia: Secondary | ICD-10-CM

## 2019-02-10 MED ORDER — HYDROXYZINE HCL 25 MG PO TABS: 25.0000 mg | ORAL_TABLET | Freq: Every evening | ORAL | 2 refills | Status: DC | PRN

## 2019-02-10 MED ORDER — SERTRALINE HCL 50 MG PO TABS: 50.0000 mg | ORAL_TABLET | Freq: Every day | ORAL | 3 refills | Status: DC

## 2019-02-10 MED FILL — hydrOXYzine HCL 25 MG TABS: 25 | 15 days supply | Qty: 30 | Fill #0

## 2019-02-10 MED FILL — SERTRALINE HCL 50 MG TABLET: 50 | 30 days supply | Qty: 30 | Fill #0

## 2019-02-10 NOTE — Progress Notes (Signed)
Pt c/o would like a referral to to breast center and also would like to discuss Xanax and prozac is not working.

## 2019-02-10 NOTE — Progress Notes (Signed)
Virtual Visit Note  I connected with patient on 02/10/19 at 1144 am by phone and verified that I am speaking with the correct person using two identifiers. Tanya [name] is currently located at home and patient is currently with them during visit. The provider, Rutherford Guys, MD is located in their office at time of visit.  I discussed the limitations, risks, security and privacy concerns of performing an evaluation and management service by telephone and the availability of in person appointments. I also discussed with the patient that there may be a patient responsible charge related to this service. The patient expressed understanding and agreed to proceed.   CC: anxiety  HPI ? Patient is a 38 y.o. female with past medical history significant for migraines, depression, s/p gastric surgery who presents today for Increased anxiety   Has seen bariatric surgeon for GI issues, started her on protonix and carafate, she is doing better  She reports that she is feeling more stressed than anything else She denies any depression currently Had episode of depression of when her mother died, she otherwise has not had issues with depression She reports lots of worrying a lot over her family and work Sometimes has sense of doom, sometimes fidgety, restless Does not sleep well at night, takes trazodone, which does not really help anymore, has issues with falling asleep, wakes up about every hour, multiple factor Had normal sleep study prior to bariatric surgery Reports irritability prozac 5m did not help at all  Tried without any benefits (chart review): cymbalta effexor vybrid prozac  Allergies  Allergen Reactions  . Imitrex [Sumatriptan] Shortness Of Breath  . Zomig [Zolmitriptan] Shortness Of Breath  . Latex Hives and Swelling  . Ibuprofen Other (See Comments)    Can not take because of surgery (No NSAIDS)  . Sulfa Antibiotics Other (See Comments)    Unknown. Since  childhood.     Prior to Admission medications   Medication Sig Start Date End Date Taking? Authorizing Provider  acetaminophen (TYLENOL) 500 MG tablet Take 500 mg by mouth daily as needed for moderate pain or fever.   Yes [provider]  Calcium 600-400 MG-UNIT CHEW Chew 1 tablet by mouth daily.   Yes [provider]  Multiple Vitamins-Minerals (BARIATRIC MULTIVITAMINS/IRON) CAPS Take 1 tablet by mouth daily.    Yes [provider]  traZODone (DESYREL) 50 MG tablet TAKE 1-2 TABLETS (50-100 MG TOTAL) BY MOUTH AT BEDTIME AS NEEDED. FOR SLEEP. CALL THE OFFICE TO SCHEDULE APPOINTMENT. 01/18/19  Yes SRutherford Guys MD  vitamin B-12 1000 MCG tablet Take 1 tablet (1,000 mcg total) by mouth daily. 12/13/16  Yes MBarton Dubois MD  FLUoxetine (PROZAC) 20 MG tablet Take 3 tablets (60 mg total) by mouth daily. Patient not taking: Reported on 02/10/2019 04/19/18   SWardell Honour MD  topiramate (TOPAMAX) 100 MG tablet TAKE 2 TABLETS BY MOUTH TWICE DAILY Patient not taking: No sig reported 01/26/18   SWardell Honour MD    Past Medical History:  Diagnosis Date  . Anemia   . Anxiety   . Arthritis    L knee OA  . Depression    history of depression in 2March 20, 2012with death of mother  . Hyperlipidemia   . Hypertension   . Migraine   . Obesity 02/2017   has had gastric sleeve    Past Surgical History:  Procedure Laterality Date  . ABDOMINAL HYSTERECTOMY    . ANAL RECTAL MANOMETRY N/A 01/11/2018  Procedure: ANO RECTAL MANOMETRY;  Surgeon: Mauri Pole, MD;  Location: [facility];  Service: Endoscopy;  Laterality: N/A;  . CARPAL TUNNEL RELEASE Bilateral   . CESAREAN SECTION     x 1  . CHOLECYSTECTOMY    . KNEE SURGERY Left   . LAPAROSCOPIC GASTRIC SLEEVE RESECTION N/A 02/17/2017   Procedure: LAPAROSCOPIC GASTRIC SLEEVE RESECTION, UPPER ENDO;  Surgeon: Johnathan Hausen, MD;  Location: [facility];  Service: General;  Laterality: N/A;  . LAPAROSCOPIC UNILATERAL SALPINGECTOMY  Left 06/10/2014   Procedure: LAPAROSCOPIC Left SALPINGECTOMY, removal of ectopic pregnancy;  Surgeon: Guss Bunde, MD;  Location: [facility];  Service: Gynecology;  Laterality: Left;  . TUBAL LIGATION    . VAGINAL HYSTERECTOMY Bilateral 03/25/2018   Procedure: HYSTERECTOMY VAGINAL, RIGHT SALPINGECTOMY;  Surgeon: Eldred Manges, MD;  Location: [facility];  Service: Gynecology;  Laterality: Bilateral;  ERAS  . WISDOM TOOTH EXTRACTION      Social History   Tobacco Use  . Smoking status: Never Smoker  . Smokeless tobacco: Never Used  Substance Use Topics  . Alcohol use: Yes    Comment: socially    Family History  Problem Relation Age of Onset  . Breast cancer Mother 53  . Cancer Mother   . Prostate cancer Father 24  . Colon polyps Father   . Breast cancer Maternal Aunt        dx in her 16s; BRCA neg  . Prostate cancer Paternal Uncle   . Diabetes Paternal Uncle   . Heart disease Maternal Grandmother   . Diabetes Paternal Grandmother   . Prostate cancer Paternal Grandfather   . Ovarian cancer Sister 45       found during her pregnancy  . Prostate cancer Paternal Uncle   . Prostate cancer Paternal Uncle   . Cancer Other   . Stroke Other   . Diabetes Other   . Coronary artery disease Other   . Colon cancer Neg Hx   . Esophageal cancer Neg Hx   . Rectal cancer Neg Hx     ROS Per hpi  Objective  Vitals as reported by the patient: none   ASSESSMENT and PLAN  1. Generalized anxiety disorder 2. Insomnia due to psychological stress Not controlled. New diagnosis. Discussed treatment options. Starting sertraline, discussed titration. Trial of vistaril for sleep. Reviewed new medications r/se/b. Patient declines counseling at this time.  Other orders - sertraline (ZOLOFT) 50 MG tablet; Take 1 tablet (50 mg total) by mouth daily. - hydrOXYzine (ATARAX/VISTARIL) 25 MG tablet; Take 1-2 tablets (25-50 mg total) by mouth at bedtime as needed.  FOLLOW-UP: 4  weeks   The above assessment and management plan was discussed with the patient. The patient verbalized understanding of and has agreed to the management plan. Patient is aware to call the clinic if symptoms persist or worsen. Patient is aware when to return to the clinic for a follow-up visit. Patient educated on when it is appropriate to go to the emergency department.    I provided 25 minutes of non-face-to-face time during this encounter.  Rutherford Guys, MD Primary Care at [place], [place] [address] Ph.  [phone] Fax [phone]

## 2019-02-16 NOTE — Progress Notes (Signed)
Patient changed from telemedicine to [place]

## 2019-02-19 ENCOUNTER — Telehealth: Payer: 59 | Admitting: Nurse Practitioner

## 2019-02-19 DIAGNOSIS — M546 Pain in thoracic spine: Secondary | ICD-10-CM

## 2019-02-19 MED ORDER — CYCLOBENZAPRINE HCL 10 MG PO TABS: 10.0000 mg | ORAL_TABLET | Freq: Three times a day (TID) | ORAL | 1 refills | Status: DC | PRN

## 2019-02-19 MED ORDER — NAPROXEN 500 MG PO TABS: 500.0000 mg | ORAL_TABLET | Freq: Two times a day (BID) | ORAL | 1 refills | Status: DC

## 2019-02-19 NOTE — Progress Notes (Signed)

## 2019-02-26 ENCOUNTER — Encounter: Payer: Self-pay | Admitting: Family Medicine

## 2019-03-01 MED FILL — hydrOXYzine HCL 25 MG TABS: 25 | 15 days supply | Qty: 30 | Fill #1

## 2019-03-01 MED FILL — PANTOPRAZOLE SOD DR 40 MG T: 40 | 30 days supply | Qty: 30 | Fill #1

## 2019-03-03 ENCOUNTER — Other Ambulatory Visit: Payer: Self-pay | Admitting: Family Medicine

## 2019-03-03 DIAGNOSIS — N644 Mastodynia: Secondary | ICD-10-CM

## 2019-03-11 ENCOUNTER — Telehealth: Payer: Self-pay | Admitting: Family Medicine

## 2019-03-11 ENCOUNTER — Encounter: Payer: Self-pay | Admitting: Family Medicine

## 2019-03-14 ENCOUNTER — Telehealth: Payer: Self-pay | Admitting: Family Medicine

## 2019-03-14 ENCOUNTER — Ambulatory Visit: Payer: 59 | Admitting: Family Medicine

## 2019-03-14 ENCOUNTER — Telehealth (INDEPENDENT_AMBULATORY_CARE_PROVIDER_SITE_OTHER): Payer: 59 | Admitting: Family Medicine

## 2019-03-14 DIAGNOSIS — F5102 Adjustment insomnia: Secondary | ICD-10-CM | POA: Diagnosis not present

## 2019-03-14 DIAGNOSIS — M533 Sacrococcygeal disorders, not elsewhere classified: Secondary | ICD-10-CM

## 2019-03-14 DIAGNOSIS — F4329 Adjustment disorder with other symptoms: Secondary | ICD-10-CM

## 2019-03-14 DIAGNOSIS — F4381 Prolonged grief disorder: Secondary | ICD-10-CM

## 2019-03-14 DIAGNOSIS — F418 Other specified anxiety disorders: Secondary | ICD-10-CM

## 2019-03-14 MED ORDER — GABAPENTIN 300 MG PO CAPS: 300.0000 mg | ORAL_CAPSULE | Freq: Three times a day (TID) | ORAL | 0 refills | Status: DC

## 2019-03-14 MED ORDER — BUPROPION HCL ER (XL) 150 MG PO TB24: 150.0000 mg | ORAL_TABLET | Freq: Every day | ORAL | 2 refills | Status: DC

## 2019-03-14 MED FILL — buPROPion HCL ER (XL) 150 M: 150 | 30 days supply | Qty: 30 | Fill #0

## 2019-03-14 MED FILL — GABAPENTIN 300 MG CAPSULE: 300 | 30 days supply | Qty: 90 | Fill #0

## 2019-03-14 NOTE — Progress Notes (Signed)
Virtual Visit Note  I connected with patient on 03/14/19 at 218pm by phone and verified that I am speaking with the correct person using two identifiers. Tanya [name] is currently located at home and patient is currently with them during visit. The provider, Rutherford Guys, MD is located in their office at time of visit.  I discussed the limitations, risks, security and privacy concerns of performing an evaluation and management service by telephone and the availability of in person appointments. I also discussed with the patient that there may be a patient responsible charge related to this service. The patient expressed understanding and agreed to proceed.   CC: anxiety  HPI ? Patient is a89 y.o.femalewith past medical history significant for migraines, depression, s/p gastric surgerywho presents today for followup on anxiety  Last OV a month ago Changed prozac to sertraline Stopped taking as it was making her hungry and eating more after being on it for about 3 weeks Having harder time with grief, regarding her mother's death which was about 8 years ago. Patient lives in her mother's house Has never done counseling, grief  Tried without any benefits (chart review): cymbalta effexor vybrid prozac  Has been dealing with tail bone issues since last year Xray in Nov 2019 - normal Mri lumbar spine 2018 - disc protrusion L5-S1 Has been told "it was dislocated at childbirth 23 years ago" She is unable to sit for long period of time, has cushions in her car, chairs at work and on her car Gave her flexeril - which just made her sleepy Her husband recently was "popping her back" and made it significantly worse, causing sharp shooting pain down into her tailbone Denies any incontinence of bowel or bladder She does not have time for PT   Allergies  Allergen Reactions  . Imitrex [Sumatriptan] Shortness Of Breath  . Zomig [Zolmitriptan] Shortness Of Breath  . Latex Hives  and Swelling  . Ibuprofen Other (See Comments)    Can not take because of surgery (No NSAIDS)  . Sulfa Antibiotics Other (See Comments)    Unknown. Since childhood.     Prior to Admission medications   Medication Sig Start Date End Date Taking? Authorizing Provider  acetaminophen (TYLENOL) 500 MG tablet Take 500 mg by mouth daily as needed for moderate pain or fever.    [provider]  Calcium 600-400 MG-UNIT CHEW Chew 1 tablet by mouth daily.    [provider]  cyclobenzaprine (FLEXERIL) 10 MG tablet Take 1 tablet (10 mg total) by mouth 3 (three) times daily as needed for muscle spasms. 02/19/19   Hassell Done Mary-Margaret, FNP  hydrOXYzine (ATARAX/VISTARIL) 25 MG tablet Take 1-2 tablets (25-50 mg total) by mouth at bedtime as needed. 02/10/19   Rutherford Guys, MD  Multiple Vitamins-Minerals (BARIATRIC MULTIVITAMINS/IRON) CAPS Take 1 tablet by mouth daily.     [provider]  naproxen (NAPROSYN) 500 MG tablet Take 1 tablet (500 mg total) by mouth 2 (two) times daily with a meal. 02/19/19   Hassell Done, Mary-Margaret, FNP  pantoprazole (PROTONIX) 40 MG tablet  03/01/19   [provider]  sertraline (ZOLOFT) 50 MG tablet Take 1 tablet (50 mg total) by mouth daily. 02/10/19   Rutherford Guys, MD  traZODone (DESYREL) 50 MG tablet TAKE 1-2 TABLETS (50-100 MG TOTAL) BY MOUTH AT BEDTIME AS NEEDED. FOR SLEEP. CALL THE OFFICE TO SCHEDULE APPOINTMENT. 01/18/19   Rutherford Guys, MD  vitamin B-12 1000 MCG tablet Take 1 tablet (  1,000 mcg total) by mouth daily. 12/13/16   Barton Dubois, MD    Past Medical History:  Diagnosis Date  . Anemia   . Anxiety   . Arthritis    L knee OA  . Depression    history of depression in 12-19-2010 with death of mother  . Hyperlipidemia   . Hypertension   . Migraine   . Obesity 02/2017   has had gastric sleeve    Past Surgical History:  Procedure Laterality Date  . ABDOMINAL HYSTERECTOMY    . ANAL RECTAL MANOMETRY N/A 01/11/2018    Procedure: ANO RECTAL MANOMETRY;  Surgeon: Mauri Pole, MD;  [facility]: [facility];  Service: Endoscopy;  Laterality: N/A;  . CARPAL TUNNEL RELEASE Bilateral   . CESAREAN SECTION     x 1  . CHOLECYSTECTOMY    . KNEE SURGERY Left   . LAPAROSCOPIC GASTRIC SLEEVE RESECTION N/A 02/17/2017   Procedure: LAPAROSCOPIC GASTRIC SLEEVE RESECTION, UPPER ENDO;  Surgeon: Johnathan Hausen, MD;  Location: [facility];  Service: General;  Laterality: N/A;  . LAPAROSCOPIC UNILATERAL SALPINGECTOMY Left 06/10/2014   Procedure: LAPAROSCOPIC Left SALPINGECTOMY, removal of ectopic pregnancy;  Surgeon: Guss Bunde, MD;  Location: [facility];  Service: Gynecology;  Laterality: Left;  . TUBAL LIGATION    . VAGINAL HYSTERECTOMY Bilateral 03/25/2018   Procedure: HYSTERECTOMY VAGINAL, RIGHT SALPINGECTOMY;  Surgeon: Eldred Manges, MD;  Location: [facility];  Service: Gynecology;  Laterality: Bilateral;  ERAS  . WISDOM TOOTH EXTRACTION      Social History   Tobacco Use  . Smoking status: Never Smoker  . Smokeless tobacco: Never Used  Substance Use Topics  . Alcohol use: Yes    Comment: socially    Family History  Problem Relation Age of Onset  . Breast cancer Mother 50  . Cancer Mother   . Prostate cancer Father 28  . Colon polyps Father   . Breast cancer Maternal Aunt        dx in her 47s; BRCA neg  . Prostate cancer Paternal Uncle   . Diabetes Paternal Uncle   . Heart disease Maternal Grandmother   . Diabetes Paternal Grandmother   . Prostate cancer Paternal Grandfather   . Ovarian cancer Sister 31       found during her pregnancy  . Prostate cancer Paternal Uncle   . Prostate cancer Paternal Uncle   . Cancer Other   . Stroke Other   . Diabetes Other   . Coronary artery disease Other   . Colon cancer Neg Hx   . Esophageal cancer Neg Hx   . Rectal cancer Neg Hx     ROS Per hpi  Objective  Vitals as reported by the patient: none   ASSESSMENT and PLAN 1. Insomnia due  to psychological stress 2. Depression with anxiety 3. Prolonged grief disorder Trial of wellbutrin, reviewed r/se/b. Referring for grief counseling. Consider psychiatry.  - Ambulatory referral to Psychology  4. Pain, coccyx Trial of gabapentin, either MSK or related to protruding disc.  - Ambulatory referral to Sports Medicine  Other orders  - buPROPion (WELLBUTRIN XL) 150 MG 24 hr tablet; Take 1 tablet (150 mg total) by mouth daily. - gabapentin (NEURONTIN) 300 MG capsule; Take 1 capsule (300 mg total) by mouth 3 (three) times daily.  FOLLOW-UP: 6 weeks   The above assessment and management plan was discussed with the patient. The patient verbalized understanding of and has agreed to the management plan. Patient is aware  to call the clinic if symptoms persist or worsen. Patient is aware when to return to the clinic for a follow-up visit. Patient educated on when it is appropriate to go to the emergency department.    I provided 25 minutes of non-face-to-face time during this encounter.  Rutherford Guys, MD Primary Care at [place], [place] [address] Ph.  [phone] Fax [phone]

## 2019-03-14 NOTE — Progress Notes (Signed)
Wanting to talk about getting on a different meds, say it is making her gain weight. Needs another depression med. Says the medication never made her feel different. Having back pain due to tailbone pain. Husband tried to pop back and the Pain has gotten worse. Does not want PT. Any meds need to go to [facility]. Pt has been tested for covid- neg

## 2019-03-14 NOTE — Telephone Encounter (Signed)
Called pt LVM for her to call we are trying to get her scheduled [place] for today

## 2019-03-24 ENCOUNTER — Other Ambulatory Visit: Payer: Self-pay

## 2019-03-24 ENCOUNTER — Ambulatory Visit: Payer: 59 | Admitting: Sports Medicine

## 2019-03-24 ENCOUNTER — Ambulatory Visit
Admission: RE | Admit: 2019-03-24 | Discharge: 2019-03-24 | Disposition: A | Discharge status: Home / Self Care | Payer: 59 | Source: Ambulatory Visit | Attending: Family Medicine | Admitting: Family Medicine

## 2019-03-24 ENCOUNTER — Other Ambulatory Visit: Payer: Self-pay | Admitting: Family Medicine

## 2019-03-24 DIAGNOSIS — N644 Mastodynia: Secondary | ICD-10-CM

## 2019-03-24 DIAGNOSIS — R928 Other abnormal and inconclusive findings on diagnostic imaging of breast: Secondary | ICD-10-CM | POA: Diagnosis not present

## 2019-03-24 MED FILL — TOPIRAMATE 100 MG TABLET: 100 | 30 days supply | Qty: 150 | Fill #4

## 2019-03-24 MED FILL — hydrOXYzine HCL 25 MG TABS: 25 | 15 days supply | Qty: 30 | Fill #2

## 2019-03-25 MED FILL — PANTOPRAZOLE SOD DR 40 MG T: 40 | 30 days supply | Qty: 30 | Fill #2

## 2019-03-30 ENCOUNTER — Encounter: Payer: Self-pay | Admitting: Family Medicine

## 2019-03-30 ENCOUNTER — Ambulatory Visit: Payer: 59 | Admitting: Family Medicine

## 2019-03-30 ENCOUNTER — Other Ambulatory Visit: Payer: Self-pay

## 2019-03-30 VITALS — BP 116/70 | Ht 62.0 in | Wt 170.0 lb

## 2019-03-30 DIAGNOSIS — M533 Sacrococcygeal disorders, not elsewhere classified: Secondary | ICD-10-CM | POA: Diagnosis not present

## 2019-03-30 NOTE — Patient Instructions (Signed)
You have coccydynia. Given this hasn't responded to 9 months of conservative treatment and your x-rays are negative we will go ahead with an MRI of you coccyx/sacrum. Follow up with me after this for a no charge visit to go over results and next steps. In meantime use donut pad or cushion to unload the area. Icing 15 minutes at a time 3-4 times a day. Aleve, tylenol as needed for pain. Do home exercises for your lumbar strain as well.  Hold stretches for 20-30 seconds each.

## 2019-03-30 NOTE — Progress Notes (Signed)
PCP: Rutherford Guys, MD  Subjective:   HPI: Patient is a 38 y.o. female here for tailbone pain.  Patient denies acute injury. She states that back in September she developed a severe pain felt in the distal aspect of her tailbone. She was evaluated in November and had radiographs with which she was told that her coccyx was dislocated may have been related to when she had a child several years ago. Pain is sharp and localized. Since that time she has been sitting on a cushion with a cut out. About a week ago her husband went to crack her back and she felt a sharp pain shoot from her back into her tailbone with a shock sensation. She has a job where she usually sits for about 12 hours at a time but had been given a note to not sit for this long. No radiation into legs. No bowel or bladder dysfunction. No numbness. No personal history of cancer.  Past Medical History:  Diagnosis Date  . Anemia   . Anxiety   . Arthritis    L knee OA  . Depression    history of depression in 2010-12-02 with death of mother  . Hyperlipidemia   . Hypertension   . Migraine   . Obesity 02/2017   has had gastric sleeve    Current Outpatient Medications on File Prior to Visit  Medication Sig Dispense Refill  . buPROPion (WELLBUTRIN XL) 150 MG 24 hr tablet Take 1 tablet (150 mg total) by mouth daily. 30 tablet 2  . Calcium 600-400 MG-UNIT CHEW Chew 1 tablet by mouth daily.    Marland Kitchen gabapentin (NEURONTIN) 300 MG capsule Take 1 capsule (300 mg total) by mouth 3 (three) times daily. 90 capsule 0  . hydrOXYzine (ATARAX/VISTARIL) 25 MG tablet Take 1-2 tablets (25-50 mg total) by mouth at bedtime as needed. 30 tablet 2  . Multiple Vitamins-Minerals (BARIATRIC MULTIVITAMINS/IRON) CAPS Take 1 tablet by mouth daily.     . naproxen (NAPROSYN) 500 MG tablet Take 1 tablet (500 mg total) by mouth 2 (two) times daily with a meal. 60 tablet 1  . pantoprazole (PROTONIX) 40 MG tablet     . sertraline (ZOLOFT) 50 MG tablet Take  1 tablet (50 mg total) by mouth daily. 30 tablet 3  . topiramate (TOPAMAX) 100 MG tablet     . traZODone (DESYREL) 50 MG tablet TAKE 1-2 TABLETS (50-100 MG TOTAL) BY MOUTH AT BEDTIME AS NEEDED. FOR SLEEP. CALL THE OFFICE TO SCHEDULE APPOINTMENT. 90 tablet 0  . vitamin B-12 1000 MCG tablet Take 1 tablet (1,000 mcg total) by mouth daily. 30 tablet 1   Current Facility-Administered Medications on File Prior to Visit  Medication Dose Route Frequency Provider Last Rate Last Dose  . cyanocobalamin ((VITAMIN B-12)) injection 1,000 mcg  1,000 mcg Intramuscular Q30 days Wardell Honour, MD   1,000 mcg at 01/02/17 6222    Past Surgical History:  Procedure Laterality Date  . ABDOMINAL HYSTERECTOMY    . ANAL RECTAL MANOMETRY N/A 01/11/2018   Procedure: ANO RECTAL MANOMETRY;  Surgeon: Mauri Pole, MD;  Location: [facility];  Service: Endoscopy;  Laterality: N/A;  . CARPAL TUNNEL RELEASE Bilateral   . CESAREAN SECTION     x 1  . CHOLECYSTECTOMY    . KNEE SURGERY Left   . LAPAROSCOPIC GASTRIC SLEEVE RESECTION N/A 02/17/2017   Procedure: LAPAROSCOPIC GASTRIC SLEEVE RESECTION, UPPER ENDO;  Surgeon: Johnathan Hausen, MD;  Location: [facility];  Service: General;  Laterality: N/A;  . LAPAROSCOPIC UNILATERAL SALPINGECTOMY Left 06/10/2014   Procedure: LAPAROSCOPIC Left SALPINGECTOMY, removal of ectopic pregnancy;  Surgeon: Guss Bunde, MD;  Location: [facility];  Service: Gynecology;  Laterality: Left;  . TUBAL LIGATION    . VAGINAL HYSTERECTOMY Bilateral 03/25/2018   Procedure: HYSTERECTOMY VAGINAL, RIGHT SALPINGECTOMY;  Surgeon: Eldred Manges, MD;  Location: [facility];  Service: Gynecology;  Laterality: Bilateral;  ERAS  . WISDOM TOOTH EXTRACTION      Allergies  Allergen Reactions  . Imitrex [Sumatriptan] Shortness Of Breath  . Zomig [Zolmitriptan] Shortness Of Breath  . Latex Hives and Swelling  . Ibuprofen Other (See Comments)    Can not take because of surgery (No NSAIDS)  .  Sulfa Antibiotics Other (See Comments)    Unknown. Since childhood.     Social History   Socioeconomic History  . Marital status: Married    Spouse name: Jeneen Rinks  . Number of children: 2  . Years of education: Not on file  . Highest education level: Not on file  Occupational History  . Occupation: Research scientist (life sciences): [employer]  . Financial resource strain: Not on file  . Food insecurity    Worry: Not on file    Inability: Not on file  . Transportation needs    Medical: Not on file    Non-medical: Not on file  Tobacco Use  . Smoking status: Never Smoker  . Smokeless tobacco: Never Used  Substance and Sexual Activity  . Alcohol use: Yes    Comment: socially  . Drug use: No  . Sexual activity: Yes    Birth control/protection: Surgical    Comment: tubal ligation 2002  Lifestyle  . Physical activity    Days per week: Not on file    Minutes per session: Not on file  . Stress: Not on file  Relationships  . Social Herbalist on phone: Not on file    Gets together: Not on file    Attends religious service: Not on file    Active member of club or organization: Not on file    Attends meetings of clubs or organizations: Not on file    Relationship status: Not on file  . Intimate partner violence    Fear of current or ex partner: Not on file    Emotionally abused: Not on file    Physically abused: Not on file    Forced sexual activity: Not on file  Other Topics Concern  . Not on file  Social History Narrative   Marital status: married x 6 years; second marriage      Children: 2 children (20, 57); 1 stepgrandaughter; 1 stepdaughter      Lives: with husband, one son      Employment: Chartered certified accountant since 2017; fifth floor surgical floor; third shift 3 twelve hour shifts      Tobacco: none      Alcohol: none      Drugs; none      Exercise: none    Family History  Problem Relation Age of Onset  . Breast cancer Mother 110  . Cancer  Mother   . Prostate cancer Father 21  . Colon polyps Father   . Breast cancer Sister 53  . Breast cancer Maternal Aunt        dx in her 10s; BRCA neg  . Prostate cancer Paternal Uncle   . Diabetes Paternal  Uncle   . Heart disease Maternal Grandmother   . Diabetes Paternal Grandmother   . Prostate cancer Paternal Grandfather   . Ovarian cancer Sister 13       found during her pregnancy  . Prostate cancer Paternal Uncle   . Prostate cancer Paternal Uncle   . Cancer Other   . Stroke Other   . Diabetes Other   . Coronary artery disease Other   . Colon cancer Neg Hx   . Esophageal cancer Neg Hx   . Rectal cancer Neg Hx     BP 116/70   Ht _0  (1.575 m)   Wt 170 lb (77.1 kg)   LMP 03/12/2018 (Exact Date)   BMI 31.09 kg/m   Review of Systems: See HPI above.     Objective:  Physical Exam:  Gen: NAD, comfortable in exam room  Back: No gross deformity, scoliosis. Severe TTP over distal sacrum, coccyx.  Mild paraspinal lumbar tenderness. FROM. Strength LEs 5/5 all muscle groups.   2+ MSRs in patellar and achilles tendons, equal bilaterally. Negative SLRs. Sensation intact to light touch bilaterally.  Bilateral hips: No deformity. FROM with 5/5 strength. No tenderness to palpation. NVI distally. Negative logroll bilateral hips Negative fabers and piriformis stretches.   Assessment & Plan:  1. Coccydynia - independently reviewed radiographs of her sacrum and coccyx showing no acute abnormalities.  She does have localized tenderness in the midline in the distal aspect of the sacrum and over the coccyx.  She has a sensation of this "popping out of place."  Unfortunately this is not responded to 9 months of conservative treatment.  At this time we will go ahead with an MRI of the area to further assess for possible bony lesion.  Icing, Aleve or Tylenol as needed.  She was given some basic home exercises for lumbar spine.

## 2019-03-31 ENCOUNTER — Telehealth: Payer: 59 | Admitting: Physician Assistant

## 2019-03-31 ENCOUNTER — Ambulatory Visit
Admission: RE | Admit: 2019-03-31 | Discharge: 2019-03-31 | Disposition: A | Discharge status: Home / Self Care | Payer: 59 | Source: Ambulatory Visit | Attending: Sports Medicine | Admitting: Sports Medicine

## 2019-03-31 DIAGNOSIS — M533 Sacrococcygeal disorders, not elsewhere classified: Secondary | ICD-10-CM | POA: Diagnosis not present

## 2019-03-31 DIAGNOSIS — M545 Low back pain: Secondary | ICD-10-CM | POA: Diagnosis not present

## 2019-03-31 DIAGNOSIS — L700 Acne vulgaris: Secondary | ICD-10-CM | POA: Diagnosis not present

## 2019-03-31 MED ORDER — MINOCYCLINE HCL 50 MG PO TABS: 50.0000 mg | ORAL_TABLET | Freq: Two times a day (BID) | ORAL | 0 refills | Status: DC

## 2019-03-31 NOTE — Progress Notes (Signed)
We are sorry that you are experiencing this issue.  Here is how we plan to help!  Based on what you shared with me it looks like you have severe acne.  Acne is a disorder of the hair follicles and oil glands (sebaceous glands). The sebaceous glands secrete oils to keep the skin moist.  When the glands get clogged, it can lead to pimples or cysts.  These cysts may become infected and leave scars. Acne is very common and normally occurs at puberty.  Acne is also inherited.  Your personal care plan consists of the following recommendations:  I recommend that you use a daily cleanser  You might try an over the counter cleanser that has benzoyl peroxide.  I recommend that you start with a product that has 2.5% benzoyl peroxide.  Stronger concentrations have not been shown to be more effective.  I have also prescribed one of the following additional therapies:  Minocycline an oral antibiotic 50 mg twice a day  If excessive dryness or peeling occurs, reduce dose frequency or concentration of the topical scrubs.  If excessive stinging or burning occurs, remove the topical gel with mild soap and water and resume at a lower dose the next day.  Remember oral antibiotics and topical acne treatments may increase your sensitivity to the sun!  HOME CARE:  Do not squeeze pimples because that can often lead to infections, worse acne, and scars.  Use a moisturizer that contains retinoid or fruit acids that may inhibit the development of new acne lesions.  Although there is not a clear link that foods can cause acne, doctors do believe that too many sweets predispose you to skin problems.  GET HELP RIGHT AWAY IF:  If your acne gets worse or is not better within 10 days.  If you become depressed.  If you become pregnant, discontinue medications and call your OB/GYN.  MAKE SURE YOU:  Understand these instructions.  Will watch your condition.  Will get help right away if you are not doing well or get  worse.   Your e-visit answers were reviewed by a board certified advanced clinical practitioner to complete your personal care plan.  Depending upon the condition, your plan could have included both over the counter or prescription medications.  Please review your pharmacy choice.  If there is a problem, you may contact your provider through [employer] and have the prescription routed to another pharmacy.  Your safety is important to [place].  If you have drug allergies check your prescription carefully.  For the next 24 hours you can use MyChart to ask questions about today's visit, request a non-urgent call back, or ask for a work or school excuse from your e-visit provider.  You will get an email in the next two days asking about your experience. I hope that your e-visit has been valuable and will speed your recovery.

## 2019-03-31 NOTE — Progress Notes (Signed)
I have spent 5 minutes in review of e-visit questionnaire, review and updating patient chart, medical decision making and response to patient.   Tjay Velazquez Cody Lavoy Bernards, PA-C    

## 2019-04-01 MED FILL — MINOCYCLINE 50 MG CAPSULE: 50 | 15 days supply | Qty: 30 | Fill #0

## 2019-04-04 ENCOUNTER — Ambulatory Visit: Payer: 59 | Admitting: Family Medicine

## 2019-04-08 MED FILL — buPROPion HCL ER (XL) 150 M: 150 | 30 days supply | Qty: 30 | Fill #1

## 2019-04-11 ENCOUNTER — Ambulatory Visit: Payer: 59 | Admitting: Psychology

## 2019-04-26 ENCOUNTER — Other Ambulatory Visit: Payer: Self-pay | Admitting: Family Medicine

## 2019-04-26 MED FILL — hydrOXYzine HCL 25 MG TABS: 25 | 15 days supply | Qty: 30 | Fill #0

## 2019-04-26 MED FILL — TOPIRAMATE 100 MG TABLET: 100 | 30 days supply | Qty: 150 | Fill #5

## 2019-04-26 NOTE — Telephone Encounter (Signed)
Requested Prescriptions  Pending Prescriptions Disp Refills  . hydrOXYzine (ATARAX/VISTARIL) 25 MG tablet [Pharmacy Med Name: hydrOXYzine HCL 25 MG TABS 25 TAB] 30 tablet 0    Sig: TAKE 1-2 TABLETS BY MOUTH AT BEDTIME AS NEEDED.     Ear, Nose, and Throat:  Antihistamines Passed - 04/26/2019 11:00 AM      Passed - Valid encounter within last 12 months    Recent Outpatient Visits          4 months ago Fever, unspecified fever cause   [facility], Lilia Argue, MD   6 months ago Pain, coccyx   [facility] at [facility], Arlie Solomons, MD   6 months ago Influenza A   [facility], Lilia Argue, MD   8 months ago Pain, coccyx   [facility] at [facility], Ruth, PA-C   10 months ago Other migraine without status migrainosus, not intractable   [facility] at [facility], Gelene Mink, PA-C      Future Appointments            In 2 weeks Rutherford Guys, MD [facility] at [place], [place] patient - connected her with scheduler at [facility] to get scheduled for a follow up appointment.

## 2019-05-02 MED FILL — PANTOPRAZOLE SOD DR 40 MG T: 40 | 30 days supply | Qty: 30 | Fill #0

## 2019-05-12 ENCOUNTER — Encounter: Payer: Self-pay | Admitting: Family Medicine

## 2019-05-12 ENCOUNTER — Other Ambulatory Visit: Payer: Self-pay

## 2019-05-12 ENCOUNTER — Ambulatory Visit (INDEPENDENT_AMBULATORY_CARE_PROVIDER_SITE_OTHER): Payer: Medicaid Other | Admitting: Family Medicine

## 2019-05-12 VITALS — BP 120/85 | HR 67 | Temp 98.5°F | Ht 62.0 in | Wt 178.2 lb

## 2019-05-12 DIAGNOSIS — K219 Gastro-esophageal reflux disease without esophagitis: Secondary | ICD-10-CM | POA: Diagnosis not present

## 2019-05-12 DIAGNOSIS — F5102 Adjustment insomnia: Secondary | ICD-10-CM

## 2019-05-12 DIAGNOSIS — R5383 Other fatigue: Secondary | ICD-10-CM | POA: Diagnosis not present

## 2019-05-12 DIAGNOSIS — G43709 Chronic migraine without aura, not intractable, without status migrainosus: Secondary | ICD-10-CM

## 2019-05-12 DIAGNOSIS — E162 Hypoglycemia, unspecified: Secondary | ICD-10-CM | POA: Diagnosis not present

## 2019-05-12 DIAGNOSIS — R5381 Other malaise: Secondary | ICD-10-CM

## 2019-05-12 DIAGNOSIS — F411 Generalized anxiety disorder: Secondary | ICD-10-CM | POA: Diagnosis not present

## 2019-05-12 LAB — POCT URINALYSIS DIP (MANUAL ENTRY)
Bilirubin, UA: NEGATIVE
Blood, UA: NEGATIVE
Glucose, UA: NEGATIVE mg/dL
Ketones, POC UA: NEGATIVE mg/dL
Leukocytes, UA: NEGATIVE
Nitrite, UA: NEGATIVE
Protein Ur, POC: NEGATIVE mg/dL
Spec Grav, UA: 1.02 (ref 1.010–1.025)
Urobilinogen, UA: 0.2 E.U./dL
pH, UA: 6.5 (ref 5.0–8.0)

## 2019-05-12 MED ORDER — HYDROXYZINE HCL 25 MG PO TABS: 25.0000 mg | ORAL_TABLET | Freq: Every evening | ORAL | 2 refills | Status: DC | PRN

## 2019-05-12 MED ORDER — PANTOPRAZOLE SODIUM 40 MG PO TBEC: 40.0000 mg | DELAYED_RELEASE_TABLET | Freq: Every day | ORAL | 2 refills | Status: DC

## 2019-05-12 MED ORDER — TRAZODONE HCL 50 MG PO TABS: 50.0000 mg | ORAL_TABLET | Freq: Every evening | ORAL | 1 refills | Status: DC | PRN

## 2019-05-12 MED ORDER — CITALOPRAM HYDROBROMIDE 20 MG PO TABS: 20.0000 mg | ORAL_TABLET | Freq: Every day | ORAL | 3 refills | Status: DC

## 2019-05-12 MED ORDER — TOPIRAMATE 100 MG PO TABS: 250.0000 mg | ORAL_TABLET | Freq: Two times a day (BID) | ORAL | 2 refills | Status: AC

## 2019-05-12 MED FILL — CITALOPRAM HBR 20 MG TABLET: 20 | 30 days supply | Qty: 30 | Fill #0

## 2019-05-12 NOTE — Patient Instructions (Signed)
° ° ° °  If you have lab work done today you will be contacted with your lab results within the next 2 weeks.  If you have not heard from us then please contact us. The fastest way to get your results is to register for My Chart. ° ° °IF you received an x-ray today, you will receive an invoice from [facility]. Please contact [facility] at [phone] with questions or concerns regarding your invoice.  ° °IF you received labwork today, you will receive an invoice from [facility]. Please contact [facility] at [phone] with questions or concerns regarding your invoice.  ° °Our billing staff will not be able to assist you with questions regarding bills from these companies. ° °You will be contacted with the lab results as soon as they are available. The fastest way to get your results is to activate your My Chart account. Instructions are located on the last page of this paperwork. If you have not heard from us regarding the results in 2 weeks, please contact this office. °  ° ° ° °

## 2019-05-12 NOTE — Progress Notes (Signed)
8/6/20209:10 AM  Tanya [name] Dec 21, 1980, 38 y.o., female [id number]  Chief Complaint  Patient presents with   Medication Refill   Illness    says she has not been feeling good at all. Has been having low glucose readings. She says the lowest has been 33. Having headaches as well with lots of urination   Depression    no longer taking depressions meds, makes her gain weight    HPI:   Patient is a 38 y.o. female with past medical history significant for depression, anxiety, migraine, HTN who presents today for malaise and hypoglycemia  Intermittent headaches, dizziness, polydipsia and polyuria x 1 month She decided to check her cbgs x 2, her husband has dm, Reports readings 55, after her morning coffee and 33 was around 1pm and she had a good breakfast.  She felt clamy in addition to headache and dizziness when she her cbg was 33. She ate a spoonful of honey peanut butter and it went up to 49 She had gastric bariatric surgery, sleeve, in 2018 Strong fhx DM, unknown type 1 PGF might have died from pancreatic cancer Denies any other fhx of GI or endo concerns She takes her routine s/p bariatric vitamins Denies any abd pain, vomiting, SOB, CP, palpitations, stool changes, no changes in skin, no swelling Positive bloating related to certain foods, mild occ nausea, hair thinning, weight gain (thinks related to wellbutrin), poor sleep, snores (neg sleep study prior to gastric bypass)   In the past tried, gained weight cymbalta effexor prozac Sertraline wellbutrin  nonfasting today  Needs refills of topomax, last neuro appt sept 2019, increased to 276m BID, doing well  Takes pantoprazole for gerd daily  [facility] 03/14/2019 02/10/2019 01/03/2019  Decreased Interest 0 0 0  Down, Depressed, Hopeless 0 0 0  PHQ - 2 Score 0 0 0    Fall Risk  05/12/2019 03/14/2019 02/10/2019 01/03/2019 12/24/2018  Falls in the past year? 0 0 0 0 0  Number falls in past yr: - 0 - 0  0  Injury with Fall? 0 0 - 0 0     Allergies  Allergen Reactions   Imitrex [Sumatriptan] Shortness Of Breath   Zomig [Zolmitriptan] Shortness Of Breath   Latex Hives and Swelling   Ibuprofen Other (See Comments)    Can not take because of surgery (No NSAIDS)   Sulfa Antibiotics Other (See Comments)    Unknown. Since childhood.     Prior to Admission medications   Medication Sig Start Date End Date Taking? Authorizing Provider  Calcium 600-400 MG-UNIT CHEW Chew 1 tablet by mouth daily.   Yes [provider]  hydrOXYzine (ATARAX/VISTARIL) 25 MG tablet TAKE 1-2 TABLETS BY MOUTH AT BEDTIME AS NEEDED. 04/26/19  Yes SRutherford Guys MD  Multiple Vitamins-Minerals (BARIATRIC MULTIVITAMINS/IRON) CAPS Take 1 tablet by mouth daily.    Yes [provider]  pantoprazole (PROTONIX) 40 MG tablet  03/01/19  Yes [provider]  topiramate (TOPAMAX) 100 MG tablet  03/24/19  Yes [provider]  traZODone (DESYREL) 50 MG tablet TAKE 1-2 TABLETS (50-100 MG TOTAL) BY MOUTH AT BEDTIME AS NEEDED. FOR SLEEP. CALL THE OFFICE TO SCHEDULE APPOINTMENT. 01/18/19  Yes SRutherford Guys MD  vitamin B-12 1000 MCG tablet Take 1 tablet (1,000 mcg total) by mouth daily. 12/13/16  Yes MBarton Dubois MD  buPROPion (WELLBUTRIN XL) 150 MG 24 hr tablet Take 1 tablet (150 mg total) by mouth daily. Patient not taking:  Reported on 05/12/2019 03/14/19   Rutherford Guys, MD    Past Medical History:  Diagnosis Date   Anemia    Anxiety    Arthritis    L knee OA   Depression    history of depression in 12/06/2010 with death of mother   Hyperlipidemia    Hypertension    Migraine    Obesity 02/2017   has had gastric sleeve    Past Surgical History:  Procedure Laterality Date   ABDOMINAL HYSTERECTOMY     ANAL RECTAL MANOMETRY N/A 01/11/2018   Procedure: ANO RECTAL MANOMETRY;  Surgeon: Mauri Pole, MD;  Location: [facility];  Service: Endoscopy;  Laterality: N/A;    CARPAL TUNNEL RELEASE Bilateral    CESAREAN SECTION     x 1   CHOLECYSTECTOMY     KNEE SURGERY Left    LAPAROSCOPIC GASTRIC SLEEVE RESECTION N/A 02/17/2017   Procedure: LAPAROSCOPIC GASTRIC SLEEVE RESECTION, UPPER ENDO;  Surgeon: Johnathan Hausen, MD;  Location: [facility];  Service: General;  Laterality: N/A;   LAPAROSCOPIC UNILATERAL SALPINGECTOMY Left 06/10/2014   Procedure: LAPAROSCOPIC Left SALPINGECTOMY, removal of ectopic pregnancy;  Surgeon: Guss Bunde, MD;  Location: [facility];  Service: Gynecology;  Laterality: Left;   TUBAL LIGATION     VAGINAL HYSTERECTOMY Bilateral 03/25/2018   Procedure: HYSTERECTOMY VAGINAL, RIGHT SALPINGECTOMY;  Surgeon: Eldred Manges, MD;  Location: [facility];  Service: Gynecology;  Laterality: Bilateral;  ERAS   WISDOM TOOTH EXTRACTION      Social History   Tobacco Use   Smoking status: Never Smoker   Smokeless tobacco: Never Used  Substance Use Topics   Alcohol use: Yes    Comment: socially    Family History  Problem Relation Age of Onset   Breast cancer Mother 55   Cancer Mother    Prostate cancer Father 59   Colon polyps Father    Breast cancer Sister 70   Breast cancer Maternal Aunt        dx in her 66s; BRCA neg   Prostate cancer Paternal Uncle    Diabetes Paternal Uncle    Heart disease Maternal Grandmother    Diabetes Paternal Grandmother    Prostate cancer Paternal Grandfather    Ovarian cancer Sister 52       found during her pregnancy   Prostate cancer Paternal Uncle    Prostate cancer Paternal Uncle    Cancer Other    Stroke Other    Diabetes Other    Coronary artery disease Other    Colon cancer Neg Hx    Esophageal cancer Neg Hx    Rectal cancer Neg Hx     ROS Per hpi  OBJECTIVE:  Today's Vitals   05/12/19 0906  BP: 120/85  Pulse: 67  Temp: 98.5 F (36.9 C)  TempSrc: Oral  SpO2: 100%  Weight: 178 lb 3.2 oz (80.8 kg)  Height: 5' 2" (1.575 m)   Body mass  index is 32.59 kg/m.    Physical Exam Vitals signs and nursing note reviewed.  Constitutional:      Appearance: She is well-developed.  HENT:     Head: Normocephalic and atraumatic.     Right Ear: Hearing, tympanic membrane, ear canal and external ear normal.     Left Ear: Hearing, tympanic membrane, ear canal and external ear normal.     Mouth/Throat:     Mouth: Mucous membranes are moist.     Pharynx: No oropharyngeal exudate or posterior  oropharyngeal erythema.  Eyes:     Extraocular Movements: Extraocular movements intact.     Conjunctiva/sclera: Conjunctivae normal.     Pupils: Pupils are equal, round, and reactive to light.  Neck:     Musculoskeletal: Neck supple.     Thyroid: No thyromegaly.  Cardiovascular:     Rate and Rhythm: Normal rate and regular rhythm.     Heart sounds: Normal heart sounds. No murmur. No friction rub. No gallop.   Pulmonary:     Effort: Pulmonary effort is normal.     Breath sounds: Normal breath sounds. No wheezing, rhonchi or rales.  Abdominal:     General: Bowel sounds are normal. There is no distension.     Palpations: Abdomen is soft. There is no hepatomegaly, splenomegaly or mass.     Tenderness: There is abdominal tenderness in the periumbilical area.  Musculoskeletal: Normal range of motion.     Right lower leg: No edema.     Left lower leg: No edema.  Lymphadenopathy:     Cervical: No cervical adenopathy.  Skin:    General: Skin is warm and dry.  Neurological:     Mental Status: She is alert and oriented to person, place, and time.     Cranial Nerves: No cranial nerve deficit.     Gait: Gait normal.     Deep Tendon Reflexes: Reflexes are normal and symmetric.  Psychiatric:        Mood and Affect: Mood normal.        Behavior: Behavior normal.     Results for orders placed or performed in visit on 05/12/19 (from the past 24 hour(s))  POCT urinalysis dipstick     Status: Normal   Collection Time: 05/12/19  9:19 AM  Result  Value Ref Range   Color, UA yellow yellow   Clarity, UA clear clear   Glucose, UA negative negative mg/dL   Bilirubin, UA negative negative   Ketones, POC UA negative negative mg/dL   Spec Grav, UA 1.020 1.010 - 1.025   Blood, UA negative negative   pH, UA 6.5 5.0 - 8.0   Protein Ur, POC negative negative mg/dL   Urobilinogen, UA 0.2 0.2 or 1.0 E.U./dL   Nitrite, UA Negative Negative   Leukocytes, UA Negative Negative    No results found.   ASSESSMENT and PLAN  1. Malaise and fatigue 2. Hypoglycemia Labs pending, discussed increase protein, frequent small meals, consider endo referral - CBC with Differential/Platelet - Comprehensive metabolic panel - TSH - Hemoglobin A1c - POCT urinalysis dipstick  3. Generalized anxiety disorder Uncontrolled. Trial of several meds with weight gain, trial of celexa, consider psych referral  4. Insomnia due to psychological stress Uncontrolled. Hoping that starting ssri will help, cont with vistaril and trazodone as needed. - traZODone (DESYREL) 50 MG tablet; Take 1-2 tablets (50-100 mg total) by mouth at bedtime as needed. for sleep.  5. Chronic migraine without aura without status migrainosus, not intractable Stable. Cont current meds  6. Gastroesophageal reflux disease without esophagitis Stable. Cont current meds  Other orders - citalopram (CELEXA) 20 MG tablet; Take 1 tablet (20 mg total) by mouth daily. - pantoprazole (PROTONIX) 40 MG tablet; Take 1 tablet (40 mg total) by mouth daily. - topiramate (TOPAMAX) 100 MG tablet; Take 2.5 tablets (250 mg total) by mouth 2 (two) times daily. - hydrOXYzine (ATARAX/VISTARIL) 25 MG tablet; Take 1-2 tablets (25-50 mg total) by mouth at bedtime as needed.  Return in about 4 weeks (around  06/09/2019).    Rutherford Guys, MD [facility] at [facility], [place] [address] Ph.  [phone] Fax [phone]

## 2019-05-13 LAB — COMPREHENSIVE METABOLIC PANEL
ALT: 31 IU/L (ref 0–32)
AST: 21 IU/L (ref 0–40)
Albumin/Globulin Ratio: 2.3 — ABNORMAL HIGH (ref 1.2–2.2)
Albumin: 4.5 g/dL (ref 3.8–4.8)
Alkaline Phosphatase: 34 IU/L — ABNORMAL LOW (ref 39–117)
BUN/Creatinine Ratio: 22 (ref 9–23)
BUN: 18 mg/dL (ref 6–24)
Bilirubin Total: 0.4 mg/dL (ref 0.0–1.2)
CO2: 20 mmol/L (ref 20–29)
Calcium: 9.5 mg/dL (ref 8.7–10.2)
Chloride: 107 mmol/L — ABNORMAL HIGH (ref 96–106)
Creatinine, Ser: 0.82 mg/dL (ref 0.57–1.00)
GFR calc Af Amer: 104 mL/min/{1.73_m2} (ref >59)
GFR calc non Af Amer: 90 mL/min/{1.73_m2} (ref >59)
Globulin, Total: 2 g/dL (ref 1.5–4.5)
Glucose: 89 mg/dL (ref 65–99)
Potassium: 3.9 mmol/L (ref 3.5–5.2)
Sodium: 141 mmol/L (ref 134–144)
Total Protein: 6.5 g/dL (ref 6.0–8.5)

## 2019-05-13 LAB — CBC WITH DIFFERENTIAL/PLATELET
Basophils Absolute: 0.1 10*3/uL (ref 0.0–0.2)
Basos: 1 %
EOS (ABSOLUTE): 0.1 10*3/uL (ref 0.0–0.4)
Eos: 2 %
Hematocrit: 44.1 % (ref 34.0–46.6)
Hemoglobin: 14.5 g/dL (ref 11.1–15.9)
Immature Grans (Abs): 0 10*3/uL (ref 0.0–0.1)
Immature Granulocytes: 0 %
Lymphocytes Absolute: 1.9 10*3/uL (ref 0.7–3.1)
Lymphs: 29 %
MCH: 28.2 pg (ref 26.6–33.0)
MCHC: 32.9 g/dL (ref 31.5–35.7)
MCV: 86 fL (ref 79–97)
Monocytes Absolute: 0.4 10*3/uL (ref 0.1–0.9)
Monocytes: 6 %
Neutrophils Absolute: 3.9 10*3/uL (ref 1.4–7.0)
Neutrophils: 62 %
Platelets: 307 10*3/uL (ref 150–450)
RBC: 5.15 x10E6/uL (ref 3.77–5.28)
RDW: 12.3 % (ref 11.7–15.4)
WBC: 6.4 10*3/uL (ref 3.4–10.8)

## 2019-05-13 LAB — HEMOGLOBIN A1C
Est. average glucose Bld gHb Est-mCnc: 97 mg/dL
Hgb A1c MFr Bld: 5 % (ref 4.8–5.6)

## 2019-05-13 LAB — TSH: TSH: 2.54 u[IU]/mL (ref 0.450–4.500)

## 2019-05-13 NOTE — Addendum Note (Signed)
Addended by: Rutherford Guys on: 05/13/2019 04:58 PM   Modules accepted: Orders

## 2019-05-24 ENCOUNTER — Encounter: Payer: Self-pay | Admitting: Family Medicine

## 2019-06-08 MED FILL — CITALOPRAM HBR 20 MG TABLET: 20 | 30 days supply | Qty: 30 | Fill #1

## 2019-06-09 ENCOUNTER — Ambulatory Visit: Payer: 59 | Admitting: Family Medicine

## 2019-06-22 MED FILL — hydrOXYzine HCL 25 MG TABS: 25 | 30 days supply | Qty: 60 | Fill #0

## 2019-06-22 MED FILL — traZODone HCL 50 MG TABS: 50 | 30 days supply | Qty: 60 | Fill #0

## 2019-07-04 MED FILL — CITALOPRAM HBR 20 MG TABLET: 20 | 30 days supply | Qty: 30 | Fill #2

## 2019-07-04 MED FILL — TOPIRAMATE 100 MG TABLET: 100 | 30 days supply | Qty: 150 | Fill #0

## 2019-07-06 MED FILL — PANTOPRAZOLE SOD DR 40 MG T: 40 | 30 days supply | Qty: 30 | Fill #1

## 2019-07-18 ENCOUNTER — Emergency Department (HOSPITAL_COMMUNITY)
Admission: EM | Admit: 2019-07-18 | Discharge: 2019-07-19 | Disposition: A | Discharge status: Home / Self Care | Payer: Medicaid Other | Attending: Emergency Medicine | Admitting: Emergency Medicine

## 2019-07-18 ENCOUNTER — Encounter (HOSPITAL_COMMUNITY): Payer: Self-pay

## 2019-07-18 ENCOUNTER — Other Ambulatory Visit: Payer: Self-pay

## 2019-07-18 DIAGNOSIS — R112 Nausea with vomiting, unspecified: Secondary | ICD-10-CM | POA: Diagnosis not present

## 2019-07-18 DIAGNOSIS — Z79899 Other long term (current) drug therapy: Secondary | ICD-10-CM | POA: Diagnosis not present

## 2019-07-18 DIAGNOSIS — R202 Paresthesia of skin: Secondary | ICD-10-CM | POA: Diagnosis not present

## 2019-07-18 DIAGNOSIS — Z91040 Latex allergy status: Secondary | ICD-10-CM | POA: Diagnosis not present

## 2019-07-18 DIAGNOSIS — I10 Essential (primary) hypertension: Secondary | ICD-10-CM | POA: Insufficient documentation

## 2019-07-18 LAB — COMPREHENSIVE METABOLIC PANEL
ALT: 30 U/L (ref 0–44)
AST: 31 U/L (ref 15–41)
Albumin: 4.4 g/dL (ref 3.5–5.0)
Alkaline Phosphatase: 37 U/L — ABNORMAL LOW (ref 38–126)
Anion gap: 10 (ref 5–15)
BUN: 16 mg/dL (ref 6–20)
CO2: 22 mmol/L (ref 22–32)
Calcium: 9.3 mg/dL (ref 8.9–10.3)
Chloride: 109 mmol/L (ref 98–111)
Creatinine, Ser: 0.83 mg/dL (ref 0.44–1.00)
GFR calc Af Amer: >60 mL/min (ref >60)
GFR calc non Af Amer: >60 mL/min (ref >60)
Glucose, Bld: 94 mg/dL (ref 70–99)
Potassium: 3.9 mmol/L (ref 3.5–5.1)
Sodium: 141 mmol/L (ref 135–145)
Total Bilirubin: 1 mg/dL (ref 0.3–1.2)
Total Protein: 7.4 g/dL (ref 6.5–8.1)

## 2019-07-18 LAB — URINALYSIS, ROUTINE W REFLEX MICROSCOPIC
Bilirubin Urine: NEGATIVE
Glucose, UA: NEGATIVE mg/dL
Hgb urine dipstick: NEGATIVE
Ketones, ur: 20 mg/dL — AB
Leukocytes,Ua: NEGATIVE
Nitrite: NEGATIVE
Protein, ur: NEGATIVE mg/dL
Specific Gravity, Urine: 1.03 (ref 1.005–1.030)
pH: 5 (ref 5.0–8.0)

## 2019-07-18 LAB — CBC
HCT: 45.9 % (ref 36.0–46.0)
Hemoglobin: 15.1 g/dL — ABNORMAL HIGH (ref 12.0–15.0)
MCH: 28.2 pg (ref 26.0–34.0)
MCHC: 32.9 g/dL (ref 30.0–36.0)
MCV: 85.6 fL (ref 80.0–100.0)
Platelets: 356 10*3/uL (ref 150–400)
RBC: 5.36 MIL/uL — ABNORMAL HIGH (ref 3.87–5.11)
RDW: 12.6 % (ref 11.5–15.5)
WBC: 7 10*3/uL (ref 4.0–10.5)
nRBC: 0 % (ref 0.0–0.2)

## 2019-07-18 LAB — LIPASE, BLOOD: Lipase: 41 U/L (ref 11–51)

## 2019-07-18 MED ORDER — ONDANSETRON HCL 4 MG PO TABS: 4.0000 mg | ORAL_TABLET | Freq: Three times a day (TID) | ORAL | 0 refills | Status: DC | PRN

## 2019-07-18 MED ORDER — LACTATED RINGERS IV BOLUS
1000.0000 mL | Freq: Once | INTRAVENOUS | Status: AC
  Administered 2019-07-18: 1000 mL via INTRAVENOUS

## 2019-07-18 MED ORDER — METOCLOPRAMIDE HCL 5 MG/ML IJ SOLN
5.0000 mg | Freq: Once | INTRAMUSCULAR | Status: AC
  Administered 2019-07-19: 5 mg via INTRAVENOUS
  Filled 2019-07-18: qty 2

## 2019-07-18 MED ORDER — PROMETHAZINE HCL 25 MG/ML IJ SOLN
12.5000 mg | Freq: Once | INTRAMUSCULAR | Status: AC
  Administered 2019-07-18: 12.5 mg via INTRAVENOUS
  Filled 2019-07-18: qty 1

## 2019-07-18 MED ORDER — SODIUM CHLORIDE 0.9% FLUSH
3.0000 mL | Freq: Once | INTRAVENOUS | Status: AC
  Administered 2019-07-18: 22:00:00 3 mL via INTRAVENOUS

## 2019-07-18 MED ORDER — HYDROMORPHONE HCL 1 MG/ML IJ SOLN
0.7500 mg | Freq: Once | INTRAMUSCULAR | Status: AC
  Administered 2019-07-18: 0.75 mg via INTRAVENOUS
  Filled 2019-07-18: qty 1

## 2019-07-18 NOTE — ED Notes (Signed)
ED Provider at bedside. 

## 2019-07-18 NOTE — ED Triage Notes (Signed)
Patient complains of dark green emesis since Sunday. States that the symptoms started on Saturday. Also reports had some facial and hand numbness with the emesis and the facial numbness resolved. No neuro deficits

## 2019-07-18 NOTE — ED Provider Notes (Signed)
[facility] EMERGENCY DEPARTMENT Provider Note   CSN: [id number] Arrival date & time: 07/18/19  30-Dec-1251     History   Chief Complaint Chief Complaint  Patient presents with  . Emesis    HPI EMELIN DASCENZO is a 38 y.o. female.     HPI   38 year old female with multiple complaints.  Saturday night/Sunday morning she began having nausea and vomiting.  Multiple episodes throughout the day Sunday.  Associated with a headache that began shortly later.  Also persistent left facial numbness, left hand numbness and left foot/leg numbness.  Facial numbness has resolved but her left hand and leg continue to feel numb, particularly her left foot.  She feels like it is "not attached to me."  Pins and needle sensation at times.  She states she has stumbled several times but she does not necessarily feel like her leg is weak.  No change in speech.  Abdominal soreness, but not pain per se.  No diarrhea.  Husband with GI symptoms last week which resolved.  History of gastric bypass 2 years ago.  Past Medical History:  Diagnosis Date  . Anemia   . Anxiety   . Arthritis    L knee OA  . Depression    history of depression in 12-29-10 with death of mother  . Hyperlipidemia   . Hypertension   . Migraine   . Obesity 02/2017   has had gastric sleeve    Patient Active Problem List   Diagnosis Date Noted  . Generalized anxiety disorder 05/12/2019  . Insomnia due to psychological stress 05/12/2019  . Gastroesophageal reflux disease without esophagitis 05/12/2019  . Volume depletion 01/19/2019  . Menorrhagia 03/25/2018  . Nausea without vomiting 03/09/2018  . LUQ abdominal pain 03/09/2018  . Bowel habit changes 12/08/2017  . Bloating 12/08/2017  . Abdominal pain 09/04/2017  . Anal fissure 09/04/2017  . Constipation 09/04/2017  . S/P laparoscopic sleeve gastrectomy May 2018 02/17/2017  . Iron deficiency anemia due to chronic blood loss 02/01/2017  . SVT (supraventricular  tachycardia) ([place])   . Anemia due to vitamin B12 deficiency 12/08/2016  . Menometrorrhagia 12/05/2016  . Morbid obesity ([place]) 12/05/2016  . Chronic migraine without aura without status migrainosus, not intractable 12/05/2016    Past Surgical History:  Procedure Laterality Date  . ABDOMINAL HYSTERECTOMY    . ANAL RECTAL MANOMETRY N/A 01/11/2018   Procedure: ANO RECTAL MANOMETRY;  Surgeon: Mauri Pole, MD;  Location: [facility];  Service: Endoscopy;  Laterality: N/A;  . CARPAL TUNNEL RELEASE Bilateral   . CESAREAN SECTION     x 1  . CHOLECYSTECTOMY    . KNEE SURGERY Left   . LAPAROSCOPIC GASTRIC SLEEVE RESECTION N/A 02/17/2017   Procedure: LAPAROSCOPIC GASTRIC SLEEVE RESECTION, UPPER ENDO;  Surgeon: Johnathan Hausen, MD;  Location: [facility];  Service: General;  Laterality: N/A;  . LAPAROSCOPIC UNILATERAL SALPINGECTOMY Left 06/10/2014   Procedure: LAPAROSCOPIC Left SALPINGECTOMY, removal of ectopic pregnancy;  Surgeon: Guss Bunde, MD;  Location: [facility];  Service: Gynecology;  Laterality: Left;  . TUBAL LIGATION    . VAGINAL HYSTERECTOMY Bilateral 03/25/2018   Procedure: HYSTERECTOMY VAGINAL, RIGHT SALPINGECTOMY;  Surgeon: Eldred Manges, MD;  Location: [facility];  Service: Gynecology;  Laterality: Bilateral;  ERAS  . WISDOM TOOTH EXTRACTION       OB History    Gravida  3   Para  2   Term  1   Preterm  AB      Living  2     SAB      TAB      Ectopic      Multiple      Live Births  2            Home Medications    Prior to Admission medications   Medication Sig Start Date End Date Taking? Authorizing Provider  Calcium 600-400 MG-UNIT CHEW Chew 1 tablet by mouth daily.    [provider]  citalopram (CELEXA) 20 MG tablet Take 1 tablet (20 mg total) by mouth daily. 05/12/19   Rutherford Guys, MD  hydrOXYzine (ATARAX/VISTARIL) 25 MG tablet Take 1-2 tablets (25-50 mg total) by mouth at bedtime as needed. 05/12/19   Rutherford Guys, MD  Multiple Vitamins-Minerals (BARIATRIC MULTIVITAMINS/IRON) CAPS Take 1 tablet by mouth daily.     [provider]  pantoprazole (PROTONIX) 40 MG tablet Take 1 tablet (40 mg total) by mouth daily. 05/12/19   Rutherford Guys, MD  topiramate (TOPAMAX) 100 MG tablet Take 2.5 tablets (250 mg total) by mouth 2 (two) times daily. 05/12/19 08/10/19  Rutherford Guys, MD  traZODone (DESYREL) 50 MG tablet Take 1-2 tablets (50-100 mg total) by mouth at bedtime as needed. for sleep. 05/12/19   Rutherford Guys, MD  vitamin B-12 1000 MCG tablet Take 1 tablet (1,000 mcg total) by mouth daily. 12/13/16   Barton Dubois, MD    Family History Family History  Problem Relation Age of Onset  . Breast cancer Mother 70  . Cancer Mother   . Prostate cancer Father 76  . Colon polyps Father   . Breast cancer Sister 76  . Breast cancer Maternal Aunt        dx in her 62s; BRCA neg  . Prostate cancer Paternal Uncle   . Diabetes Paternal Uncle   . Heart disease Maternal Grandmother   . Diabetes Paternal Grandmother   . Prostate cancer Paternal Grandfather   . Ovarian cancer Sister 94       found during her pregnancy  . Prostate cancer Paternal Uncle   . Prostate cancer Paternal Uncle   . Cancer Other   . Stroke Other   . Diabetes Other   . Coronary artery disease Other   . Colon cancer Neg Hx   . Esophageal cancer Neg Hx   . Rectal cancer Neg Hx     Social History Social History   Tobacco Use  . Smoking status: Never Smoker  . Smokeless tobacco: Never Used  Substance Use Topics  . Alcohol use: Yes    Comment: socially  . Drug use: No     Allergies   Imitrex [sumatriptan], Zomig [zolmitriptan], Latex, Ibuprofen, and Sulfa antibiotics   Review of Systems Review of Systems  All systems reviewed and negative, other than as noted in HPI.  Physical Exam Updated Vital Signs BP 107/65   Pulse (!) 48   Temp 97.9 F (36.6 C) (Oral)   Resp 16   LMP 03/12/2018 (Exact Date)    SpO2 98%   Physical Exam Vitals signs and nursing note reviewed.  Constitutional:      General: She is not in acute distress.    Appearance: She is well-developed. She is obese.  HENT:     Head: Normocephalic and atraumatic.  Eyes:     General:        Right eye: No discharge.  Left eye: No discharge.     Conjunctiva/sclera: Conjunctivae normal.  Neck:     Musculoskeletal: Neck supple.  Cardiovascular:     Rate and Rhythm: Normal rate and regular rhythm.     Heart sounds: Normal heart sounds. No murmur. No friction rub. No gallop.   Pulmonary:     Effort: Pulmonary effort is normal. No respiratory distress.     Breath sounds: Normal breath sounds.  Abdominal:     General: There is no distension.     Palpations: Abdomen is soft.     Tenderness: There is no abdominal tenderness.  Musculoskeletal:        General: No tenderness.  Skin:    General: Skin is warm and dry.  Neurological:     Mental Status: She is alert. She is disoriented.     Cranial Nerves: No cranial nerve deficit.     Sensory: Sensory deficit present.     Comments: [place] clear.  Content appropriate.  Follow commands.  Cranial nerves II through XII intact bilaterally.  Strength is 5 out of 5 bilateral upper extremities and right lower extremity.  Questionable mild the weakness left lower extremity.  Decreased sensation to light touch in the left foot extending up to about mid shin.  Sensation seems normal in the left radial, ulnar and median nerve distributions.  Psychiatric:        Behavior: Behavior normal.        Thought Content: Thought content normal.      ED Treatments / Results  Labs (all labs ordered are listed, but only abnormal results are displayed) Labs Reviewed  COMPREHENSIVE METABOLIC PANEL - Abnormal; Notable for the following components:      Result Value   Alkaline Phosphatase 37 (*)    All other components within normal limits  CBC - Abnormal; Notable for the following components:    RBC 5.36 (*)    Hemoglobin 15.1 (*)    All other components within normal limits  URINALYSIS, ROUTINE W REFLEX MICROSCOPIC - Abnormal; Notable for the following components:   APPearance HAZY (*)    Ketones, ur 20 (*)    All other components within normal limits  LIPASE, BLOOD    EKG None  Radiology No results found.  Procedures Procedures (including critical care time)  Medications Ordered in ED Medications  metoCLOPramide (REGLAN) injection 5 mg (has no administration in time range)  sodium chloride flush (NS) 0.9 % injection 3 mL (3 mLs Intravenous Given 07/18/19 2210)  lactated ringers bolus 1,000 mL (1,000 mLs Intravenous New Bag/Given 07/18/19 2208)  promethazine (PHENERGAN) injection 12.5 mg (12.5 mg Intravenous Given 07/18/19 2209)  HYDROmorphone (DILAUDID) injection 0.75 mg (0.75 mg Intravenous Given 07/18/19 2208)     Initial Impression / Assessment and Plan / ED Course  I have reviewed the triage vital signs and the nursing notes.  Pertinent labs & imaging results that were available during my care of the patient were reviewed by me and considered in my medical decision making (see chart for details).       38 year old female with multiple complaints.  She has had nausea and vomiting, but none since being back in the emergency room for several hours at this point.  Her abdominal exam is benign.  She has a past history of gastric bypass surgery 2 years ago.  Clinically I have a low suspicion for bowel obstruction.  May just be simple GI illness, particularly with husband having symptoms a few days preceding hers.  Persistent left-sided numbness and questionable weakness in left lower extremity.  Has been well beyond 24 hours since symptom onset at this point.  We will go ahead and MRI to evaluate for possible CVA.  This is negative, I think she could appropriately follow-up as an outpatient with her PCP or neurology.  Continued symptomatic treatment of nausea.  Final  Clinical Impressions(s) / ED Diagnoses   Final diagnoses:  Nausea and vomiting, intractability of vomiting not specified, unspecified vomiting type  Paresthesias    ED Discharge Orders    None       Virgel Manifold, MD 07/23/19 1311

## 2019-07-19 ENCOUNTER — Emergency Department (HOSPITAL_COMMUNITY): Payer: Medicaid Other

## 2019-07-19 ENCOUNTER — Ambulatory Visit: Payer: Medicaid Other | Admitting: Adult Health Nurse Practitioner

## 2019-07-19 ENCOUNTER — Other Ambulatory Visit: Payer: Self-pay

## 2019-07-19 DIAGNOSIS — Z20822 Contact with and (suspected) exposure to covid-19: Secondary | ICD-10-CM

## 2019-07-19 MED FILL — ONDANSETRON HCL 4 MG TABLET: 4 | 6 days supply | Qty: 20 | Fill #0

## 2019-07-19 NOTE — ED Provider Notes (Signed)
Care assumed from Dr. Wilson Singer, patient with paresthesias on left side pending MRI of the brain.  MRI shows no evidence of stroke.  Patient states that her left leg is still very numb.  She does seem to have some weakness suggestive of a lumbar radiculopathy.  She is referred to neurology for follow-up.   Delora Fuel, MD [id number] [phone]

## 2019-07-19 NOTE — ED Notes (Signed)
Patient transported to  mri 

## 2019-07-21 LAB — NOVEL CORONAVIRUS, NAA: SARS-CoV-2, NAA: NOT DETECTED

## 2019-07-22 ENCOUNTER — Other Ambulatory Visit: Payer: Self-pay

## 2019-07-22 DIAGNOSIS — Z20822 Contact with and (suspected) exposure to covid-19: Secondary | ICD-10-CM

## 2019-07-24 LAB — NOVEL CORONAVIRUS, NAA: SARS-CoV-2, NAA: NOT DETECTED

## 2019-07-26 MED FILL — hydrOXYzine HCL 25 MG TABS: 25 | 30 days supply | Qty: 60 | Fill #1

## 2019-07-27 ENCOUNTER — Other Ambulatory Visit: Payer: Self-pay

## 2019-07-27 ENCOUNTER — Ambulatory Visit: Payer: Medicaid Other | Admitting: Neurology

## 2019-07-27 ENCOUNTER — Encounter: Payer: Self-pay | Admitting: Neurology

## 2019-07-27 VITALS — BP 118/60 | HR 81 | Temp 97.5°F | Ht 62.0 in | Wt 188.0 lb

## 2019-07-27 DIAGNOSIS — R202 Paresthesia of skin: Secondary | ICD-10-CM

## 2019-07-27 DIAGNOSIS — R200 Anesthesia of skin: Secondary | ICD-10-CM

## 2019-07-27 DIAGNOSIS — B349 Viral infection, unspecified: Secondary | ICD-10-CM

## 2019-07-27 DIAGNOSIS — Z9884 Bariatric surgery status: Secondary | ICD-10-CM | POA: Diagnosis not present

## 2019-07-27 NOTE — Patient Instructions (Addendum)
I am pleased to hear that you are feeling a little better.  Nevertheless, for your numbness in your left leg, we will evaluate you for potential neuropathy or nerve damage, we will proceed with blood work today and also schedule you for an EMG and nerve conduction test in our office as explained. Please schedule your yearly eye examination.  Your neurological exam otherwise is normal which is reassuring.  In the context of a recent acute illness, you may have had a viral illness and may still improve as you recuperate from the viral syndrome. For now, we will follow-up if needed, we will Call you with the test results in taken from there.

## 2019-07-27 NOTE — Progress Notes (Signed)
Subjective:    Patient ID: Tanya Tanya [name] is a 38 y.o. female.  HPI     Tanya Age, MD, PhD [facility] [address], [address] [place], [place] [address]  I saw Tanya Tanya [name] as a referral from the emergency room for evaluation of left-sided paresthesias.  The patient is unaccompanied today.  Tanya Tanya [name] is a 38 year old right-handed woman with an underlying medical history of migraine headaches, obesity, hypertension, hyperlipidemia, depression, anxiety, arthritis and anemia, who presented to the emergency room on 07/18/2019 with multiple complaints including nausea, vomiting, headaches, paresthesias with numbness in the left face arm and leg reported.  She was tested for COVID-19 and tested negative with nasal pharyngeal swab.  I reviewed the emergency room records.  She had some blood work on 07/18/2019, lipase was normal, CMP showed unremarkable results, CBC with differential showed  borderline elevation in hemoglobin at 15.1 and mildly elevated RBC at 5.36.  She had a brain MRI without contrast on 07/19/2019 and I reviewed the results: IMPRESSION: No acute intracranial abnormality and normal for Tanya [name] noncontrast MRI appearance of the brain. She reports that she has had symptoms for a little over a week.  She started feeling sick with nausea and vomiting, her husband had similar symptoms.  She then started having pins-and-needles and stinging sensation in the left foot, this is still persistent, she has had intermittent paresthesias including pins-and-needles sensation and stinging sensation in the left hand intermittently and right foot intermittently.  No actual weakness, no falls, no bladder or bowel incontinence.  She denies any severe headaches.  Her facial numbness improved.  She denies any recent new medications, she has not had any major lifestyle changes.  She is not in any routine follow-up with her bariatric surgeon or nutritionist since her  bariatric surgery a couple of years ago and after initial follow-up.  She had blood work with her primary care physician on 05/12/2019 which showed a normal TSH at 2.5 and A1c was 5.0 at the time. She is a non-smoker and drinks alcohol very rarely, on special occasion, caffeine in the form of coffee, 1 cup/day on average.  She hydrates with water throughout the day. She denies any visual symptoms including blurry vision or double vision.  She has had no vision loss.  She has prescription eyeglasses which she is supposed to use at night.  She had an eye examination about a year ago. I have previously evaluated her over 2 years ago for suspected sleep apnea.  She had a sleep study on 02/02/2017 which did not show any significant sleep disordered breathing, mild REM related sleep apnea was seen with a REM AHI of 12/h, overall AHI was 2.9/h, O2 nadir 90%.  Her BMI was 52.9 at the time.  She had interim gastric bypass surgery and has lost a significant amount of weight.   Previously:  01/12/2017: 38 year old right-handed woman with an underlying medical history of anxiety, depression, osteoarthritis, migraine headaches, anemia and morbid obesity, who reports snoring and excessive daytime somnolence. I reviewed your office note from 01/02/2017. She had a sleep study several years ago with neg. Results for OSA, per her verbal report. She does not recall where she had the test.  She is in the process of being evaluated for bariatric surgery. Her Epworth sleepiness score is 12 out of 24 today, her fatigue score is 42 out of 63 today. She lives at home with her husband and son. She has 2 children. She  is a nonsmoker and does not drink alcohol or use illicit drugs and does not utilize caffeine on a daily basis. 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 Bedtime typically is a 30 a.m. and she wakes up around 4 PM. She sleeps at night when she can.  Bedtime for nighttime sleep is 10 PM and she wakes up around 9 AM and if she has to work that night she also takes a nap before going into work. She has discontinued caffeine in the past couple of months. She was recently started on metoprolol for SVT. She reports nocturia about 2-3 times on an average night. She denies morning headaches and feels that her migraines are under control on Topamax. She has no family history of OSA.    Her Past Medical History Is Significant For: Past Medical History:  Diagnosis Date  . Anemia   . Anxiety   . Arthritis    L knee OA  . Depression    history of depression in December 31, 2010 with death of mother  . Hyperlipidemia   . Hypertension   . Migraine   . Obesity 02/2017   has had gastric sleeve    Her Past Surgical History Is Significant For: Past Surgical History:  Procedure Laterality Date  . ABDOMINAL HYSTERECTOMY    . ANAL RECTAL MANOMETRY N/A 01/11/2018   Procedure: ANO RECTAL MANOMETRY;  Surgeon: Mauri Pole, MD;  Location: [facility];  Service: Endoscopy;  Laterality: N/A;  . CARPAL TUNNEL RELEASE Bilateral   . CESAREAN SECTION     x 1  . CHOLECYSTECTOMY    . KNEE SURGERY Left   . LAPAROSCOPIC GASTRIC SLEEVE RESECTION N/A 02/17/2017   Procedure: LAPAROSCOPIC GASTRIC SLEEVE RESECTION, UPPER ENDO;  Surgeon: Johnathan Hausen, MD;  Location: [facility];  Service: General;  Laterality: N/A;  . LAPAROSCOPIC UNILATERAL SALPINGECTOMY Left 06/10/2014   Procedure: LAPAROSCOPIC Left SALPINGECTOMY, removal of ectopic pregnancy;  Surgeon: Guss Bunde, MD;  Location: [facility];  Service: Gynecology;  Laterality: Left;  . TUBAL LIGATION    . VAGINAL HYSTERECTOMY Bilateral 03/25/2018   Procedure: HYSTERECTOMY VAGINAL, RIGHT SALPINGECTOMY;  Surgeon: Eldred Manges, MD;  Location: [facility];  Service: Gynecology;  Laterality: Bilateral;  ERAS  . WISDOM TOOTH EXTRACTION      Her Family History Is Significant For: Family History  Problem  Relation Tanya [name] of Onset  . Breast cancer Mother 24  . Cancer Mother   . Prostate cancer Father 8  . Colon polyps Father   . Breast cancer Sister 14  . Breast cancer Maternal Aunt        dx in her 39s; BRCA neg  . Prostate cancer Paternal Uncle   . Diabetes Paternal Uncle   . Heart disease Maternal Grandmother   . Diabetes Paternal Grandmother   . Prostate cancer Paternal Grandfather   . Ovarian cancer Sister 54       found during her pregnancy  . Prostate cancer Paternal Uncle   . Prostate cancer Paternal Uncle   . Cancer Other   . Stroke Other   . Diabetes Other   . Coronary artery disease Other   . Colon cancer Neg Hx   . Esophageal cancer Neg Hx   . Rectal cancer Neg Hx     Her Social History Is Significant For: Social History   Socioeconomic History  . Marital status: Married  Spouse name: Jeneen Rinks  . Number of children: 2  . Years of education: Not on file  . Highest education level: Not on file  Occupational History  . Occupation: Research scientist (life sciences): [place]  . Financial resource strain: Not on file  . Food insecurity    Worry: Not on file    Inability: Not on file  . Transportation needs    Medical: Not on file    Non-medical: Not on file  Tobacco Use  . Smoking status: Never Smoker  . Smokeless tobacco: Never Used  Substance and Sexual Activity  . Alcohol use: Yes    Comment: socially  . Drug use: No  . Sexual activity: Yes    Birth control/protection: Surgical    Comment: tubal ligation 2002  Lifestyle  . Physical activity    Days per week: Not on file    Minutes per session: Not on file  . Stress: Not on file  Relationships  . Social Herbalist on phone: Not on file    Gets together: Not on file    Attends religious service: Not on file    Active member of club or organization: Not on file    Attends meetings of clubs or organizations: Not on file    Relationship status: Not on file  Other Topics  Concern  . Not on file  Social History Narrative   Marital status: married x 6 years; second marriage      Children: 2 children (20, 34); 1 stepgrandaughter; 1 stepdaughter      Lives: with husband, one son      Employment: Chartered certified accountant since 2017; fifth floor surgical floor; third shift 3 twelve hour shifts      Tobacco: none      Alcohol: none      Drugs; none      Exercise: none    Her Allergies Are:  Allergies  Allergen Reactions  . Imitrex [Sumatriptan] Shortness Of Breath  . Zomig [Zolmitriptan] Shortness Of Breath  . Latex Hives and Swelling  . Ibuprofen Other (See Comments)    Can not take because of surgery (No NSAIDS)  . Sulfa Antibiotics Other (See Comments)    Unknown. Since childhood.   :   Her Current Medications Are:  Outpatient Encounter Medications as of 07/27/2019  Medication Sig  . Calcium 600-400 MG-UNIT CHEW Chew 1 tablet by mouth daily.  . hydrOXYzine (ATARAX/VISTARIL) 25 MG tablet Take 1-2 tablets (25-50 mg total) by mouth at bedtime as needed.  . Multiple Vitamins-Minerals (BARIATRIC MULTIVITAMINS/IRON) CAPS Take 1 tablet by mouth daily.   . pantoprazole (PROTONIX) 40 MG tablet Take 1 tablet (40 mg total) by mouth daily.  Marland Kitchen topiramate (TOPAMAX) 100 MG tablet Take 2.5 tablets (250 mg total) by mouth 2 (two) times daily.  . traZODone (DESYREL) 50 MG tablet Take 1-2 tablets (50-100 mg total) by mouth at bedtime as needed. for sleep.  . vitamin B-12 1000 MCG tablet Take 1 tablet (1,000 mcg total) by mouth daily.  . [DISCONTINUED] citalopram (CELEXA) 20 MG tablet Take 1 tablet (20 mg total) by mouth daily.  . [DISCONTINUED] ondansetron (ZOFRAN) 4 MG tablet Take 1 tablet (4 mg total) by mouth every 8 (eight) hours as needed for nausea or vomiting.   Facility-Administered Encounter Medications as of 07/27/2019  Medication  . cyanocobalamin ((VITAMIN B-12)) injection 1,000 mcg  :   Review of Systems:  Out of a  complete 14 point review of systems, all are  reviewed and negative with the exception of these symptoms as listed below:    Review of Systems  Neurological:       Pt presents today to discuss the numbness in her left foot. It has been present for a week and stable.    Objective:  Neurological Exam  Physical Exam Physical Examination:   Vitals:   07/27/19 0825  BP: 118/60  Pulse: 81  Temp: (!) 97.5 F (36.4 C)    General Examination: The patient is a very pleasant 38 y.o. female in no acute distress. She appears well-developed and well-nourished and well groomed.   HEENT: Normocephalic, atraumatic, pupils are equal, round and reactive to light and accommodation. Funduscopic exam is normal with sharp disc margins noted. Extraocular tracking is good without limitation to gaze excursion or nystagmus noted. Normal smooth pursuit is noted. Hearing is grossly intact. Face is symmetric with normal facial animation and normal facial sensation. Speech is clear with no dysarthria noted. There is no hypophonia. There is no lip, neck/head, jaw or voice tremor. Neck is supple with full range of passive and active motion. There are no carotid bruits on auscultation. Oropharynx exam reveals: mild mouth dryness, adequate dental hygiene. Tongue protrudes centrally and palate elevates symmetrically.   Chest: Clear to auscultation without wheezing, rhonchi or crackles noted.  Heart: S1+S2+0, regular and normal without murmurs, rubs or gallops noted.   Abdomen: Soft, non-tender and non-distended with normal bowel sounds appreciated on auscultation.  Extremities: There is no pitting edema in the distal lower extremities bilaterally. Pedal pulses are intact.  Skin: Warm and dry without trophic changes noted. There are no varicose veins.  Musculoskeletal: exam reveals no obvious joint deformities, tenderness or joint swelling or erythema.   Neurologically:  Mental status: The patient is awake, alert and oriented in all 4 spheres. Her immediate  and remote memory, attention, language skills and fund of knowledge are appropriate. There is no evidence of aphasia, agnosia, apraxia or anomia. Speech is clear with normal prosody and enunciation. Thought process is linear. Mood is normal and affect is normal.  Cranial nerves II - XII are as described above under HEENT exam. In addition: shoulder shrug is normal with equal shoulder height noted. Motor exam: Normal bulk, strength and tone is noted. There is no drift, tremor or rebound. Reflexes are 2+ throughout. Babinski: Toes are flexor bilaterally. Fine motor skills and coordination: intact with normal finger taps, normal hand movements, normal rapid alternating patting, normal foot taps and normal foot agility.  Cerebellar testing: No dysmetria or intention tremor on finger to nose testing. Heel to shin is unremarkable bilaterally. There is no truncal or gait ataxia.  Sensory exam: intact to light touch, pinprick, vibration, temperature sense in the upper and lower extremities,With the exception of decreased pinprick sensation in the left lower extremity up to the knee, decreased vibration in the left big toe. Gait, station and balance: She stands easily. No veering to one side is noted. No leaning to one side is noted. Posture is Tanya [name]-appropriate and stance is narrow based. Gait shows normal stride length and normal pace. No problems turning are noted.  Assessment and Plan:  Assessment and Plan:  In summary, MA MUNOZ is a very pleasant 38 y.o.-year old female with an underlying medical history of migraine headaches, obesity, With status post weight loss surgery, hypertension, hyperlipidemia, depression, anxiety, arthritis and anemia, who Presents for evaluation of her left leg numbness.  She presented to the emergency room on 07/18/2019 with acute illness including nausea, vomiting, headache and paresthesias, left sided numbness affecting the face, left hand and left foot at the time.  She had  a noncontrasted brain MRI which was unremarkable.  Her symptoms improved, her husband also had symptoms in keeping with a viral infection with nausea and vomiting and feeling acutely ill.  She tested negative for COVID-19.  Her examination is largely benign with the exception of somewhat decreased pinprick sensation in the left leg up to the knee area and decreased vibration sense in the left foot and the toe area.  Otherwise, she has had some intermittent paresthesias in the left hand and right foot, overall feels that she has improved.Her symptoms could be in keeping with a recent viral syndrome, reassuringly, she has a otherwise benign neurological examination, preserved reflexes throughout including the ankles.  She has no obvious weakness.Given her bariatric surgery status, would like to proceed with additional blood work to look for any vitamin deficiencies including B1, B6, B12.  We will also do additional blood workTo look for any obvious treatable causes for neuropathy, her A1c was normal recently within the last 3 months.She is encouraged to schedule her yearly eye examination routinely.  She is advised to stay well-hydrated with water.  We will proceed with an EMG and nerve conduction test to see if there is any obvious evidence of neuropathy.We will call her with her test results for now and keep her posted by phone, and take it from there, follow-up if needed.  I answered all her questions today and she was in agreement.  Tanya Age, MD, PhD

## 2019-08-03 ENCOUNTER — Telehealth: Payer: Self-pay | Admitting: Neurology

## 2019-08-03 NOTE — Telephone Encounter (Signed)
Pt states its been about 9 days and she has not been contacted yet with the results to her lab work, please call

## 2019-08-03 NOTE — Telephone Encounter (Signed)
I called pt and explained this to her. Pt verbalized understanding of results. Pt had no questions at this time but was encouraged to call back if questions arise.

## 2019-08-03 NOTE — Telephone Encounter (Signed)
I did not put a result note in yet for labs because not everything was back. Her labs are normal, vitamin B6 is pending, we can call if abnormal.Please update patient.

## 2019-08-04 LAB — RHEUMATOID FACTOR: Rheumatoid fact SerPl-aCnc: <10 IU/mL (ref 0.0–13.9)

## 2019-08-04 LAB — B12 AND FOLATE PANEL
Folate: 9.2 ng/mL (ref >3.0)
Vitamin B-12: 1164 pg/mL (ref 232–1245)

## 2019-08-04 LAB — MULTIPLE MYELOMA PANEL, SERUM
Albumin SerPl Elph-Mcnc: 3.8 g/dL (ref 2.9–4.4)
Albumin/Glob SerPl: 1.6 (ref 0.7–1.7)
Alpha 1: 0.2 g/dL (ref 0.0–0.4)
Alpha2 Glob SerPl Elph-Mcnc: 0.6 g/dL (ref 0.4–1.0)
B-Globulin SerPl Elph-Mcnc: 0.9 g/dL (ref 0.7–1.3)
Gamma Glob SerPl Elph-Mcnc: 0.7 g/dL (ref 0.4–1.8)
Globulin, Total: 2.5 g/dL (ref 2.2–3.9)
IgA/Immunoglobulin A, Serum: 191 mg/dL (ref 87–352)
IgG (Immunoglobin G), Serum: 820 mg/dL (ref 586–1602)
IgM (Immunoglobulin M), Srm: 120 mg/dL (ref 26–217)
Total Protein: 6.3 g/dL (ref 6.0–8.5)

## 2019-08-04 LAB — C-REACTIVE PROTEIN: CRP: <1 mg/L (ref 0–10)

## 2019-08-04 LAB — HGB A1C W/O EAG: Hgb A1c MFr Bld: 5.2 % (ref 4.8–5.6)

## 2019-08-04 LAB — RPR: RPR Ser Ql: NONREACTIVE

## 2019-08-04 LAB — ANA W/REFLEX: Anti Nuclear Antibody (ANA): NEGATIVE

## 2019-08-04 LAB — VITAMIN B1: Thiamine: 118.3 nmol/L (ref 66.5–200.0)

## 2019-08-04 LAB — VITAMIN B6: Vitamin B6: 8.9 ug/L (ref 2.0–32.8)

## 2019-08-08 MED FILL — traZODone HCL 50 MG TABS: 50 | 30 days supply | Qty: 60 | Fill #1

## 2019-08-29 MED FILL — TOPIRAMATE 100 MG TABLET: 100 | 30 days supply | Qty: 150 | Fill #1

## 2019-08-29 MED FILL — hydrOXYzine HCL 25 MG TABS: 25 | 30 days supply | Qty: 60 | Fill #2

## 2019-09-14 ENCOUNTER — Other Ambulatory Visit: Payer: Self-pay

## 2019-09-14 ENCOUNTER — Telehealth: Payer: Self-pay

## 2019-09-14 ENCOUNTER — Ambulatory Visit: Payer: Medicaid Other | Admitting: Neurology

## 2019-09-14 ENCOUNTER — Encounter: Payer: Self-pay | Admitting: Neurology

## 2019-09-14 ENCOUNTER — Ambulatory Visit (INDEPENDENT_AMBULATORY_CARE_PROVIDER_SITE_OTHER): Payer: Medicaid Other | Admitting: Neurology

## 2019-09-14 DIAGNOSIS — R200 Anesthesia of skin: Secondary | ICD-10-CM

## 2019-09-14 DIAGNOSIS — B349 Viral infection, unspecified: Secondary | ICD-10-CM

## 2019-09-14 DIAGNOSIS — R202 Paresthesia of skin: Secondary | ICD-10-CM | POA: Diagnosis not present

## 2019-09-14 DIAGNOSIS — Z9884 Bariatric surgery status: Secondary | ICD-10-CM

## 2019-09-14 NOTE — Progress Notes (Addendum)
Please call and advise the patient that the recent EMG and nerve conduction velocity test, which is the electrical nerve and muscle test we we performed, was reported as within normal limits. We checked for abnormal electrical discharges in the muscles or nerves of the lower extremities, and the report suggested normal findings. No further action is required on this test at this time.  At this juncture, the patient can follow-up with her primary care physician as scheduled.      [place]    Nerve / Sites Muscle Latency Ref. Amplitude Ref. Rel Amp Segments Distance Velocity Ref. Area    ms ms mV mV %  cm m/s m/s mVms  L Peroneal - EDB     Ankle EDB 4.2 ?6.5 3.6 ?2.0 100 Ankle - EDB 9   11.3     Fib head EDB 9.6  7.3  206 Fib head - Ankle 26 48 ?44 23.4     Pop fossa EDB 11.6  7.2  97.8 Pop fossa - Fib head 10 49 ?44 23.1     Acc Peron EDB 3.9  0.6  8.97 Pop fossa - Ankle    4.0         Acc Peron - Pop fossa      R Peroneal - EDB     Ankle EDB 4.9 ?6.5 5.9 ?2.0 100 Ankle - EDB 9   17.9     Fib head EDB 9.9  6.4  108 Fib head - Ankle 26 52 ?44 19.7     Pop fossa EDB 12.0  6.2  97.5 Pop fossa - Fib head 10 47 ?44 19.3     Acc Peron EDB 4.0  0.2  3.4 Pop fossa - Ankle    2.5         Acc Peron - Pop fossa      L Tibial - AH     Ankle AH 3.8 ?5.8 9.5 ?4.0 100 Ankle - AH 9   25.3     Pop fossa AH 11.4  9.0  95.1 Pop fossa - Ankle 35 46 ?41 27.2  R Tibial - AH     Ankle AH 3.9 ?5.8 11.8 ?4.0 100 Ankle - AH 9   23.4     Pop fossa AH 12.3  9.9  84.4 Pop fossa - Ankle 35 42 ?41 20.8             SNC    Nerve / Sites Rec. Site Peak Lat Ref.  Amp Ref. Segments Distance    ms ms V V  cm  L Sural - Ankle (Calf)     Calf Ankle 2.9 ?4.4 6 ?6 Calf - Ankle 14  R Sural - Ankle (Calf)     Calf Ankle 3.3 ?4.4 19 ?6 Calf - Ankle 14  L Superficial peroneal - Ankle     Lat leg Ankle 3.4 ?4.4 18 ?6 Lat leg - Ankle 14  R Superficial peroneal - Ankle     Lat leg Ankle 3.3 ?4.4 18 ?6 Lat leg - Ankle 14         F  Wave    Nerve F Lat Ref.   ms ms  L Tibial - AH 48.2 ?56.0  R Tibial - AH 46.9 ?56.0

## 2019-09-14 NOTE — Progress Notes (Signed)
Please refer to EMG and nerve conduction procedure note.  

## 2019-09-14 NOTE — Procedures (Signed)
     HISTORY:  Tanya [name] is a 38 year old patient with a 4 or 59-month history of some numbness mainly in the left foot and leg up to the knee, occasional numbness is noted in the right foot.  The patient reports minimal low back pain.  She is being evaluated for possible neuropathy or a lumbosacral radiculopathy.  NERVE CONDUCTION STUDIES:  Nerve conduction studies were performed on both lower extremities. The distal motor latencies and motor amplitudes for the peroneal and posterior tibial nerves were within normal limits. The nerve conduction velocities for these nerves were also normal. The sensory latencies for the peroneal and sural nerves were within normal limits. The F wave latencies for the posterior tibial nerves were within normal limits.   EMG STUDIES:  EMG study was performed on the left lower extremity:  The tibialis anterior muscle reveals 2 to 4K motor units with full recruitment. No fibrillations or positive waves were seen. The peroneus tertius muscle reveals 2 to 4K motor units with full recruitment. No fibrillations or positive waves were seen. The medial gastrocnemius muscle reveals 1 to 3K motor units with full recruitment. No fibrillations or positive waves were seen. The vastus lateralis muscle reveals 2 to 4K motor units with full recruitment. No fibrillations or positive waves were seen. The iliopsoas muscle reveals 2 to 4K motor units with full recruitment. No fibrillations or positive waves were seen. The biceps femoris muscle (long head) reveals 2 to 4K motor units with full recruitment. No fibrillations or positive waves were seen. The lumbosacral paraspinal muscles were tested at 3 levels, and revealed no abnormalities of insertional activity at all 3 levels tested. There was good relaxation.   IMPRESSION:  Nerve conduction studies done on both lower extremities were unremarkable.  No evidence of a peripheral neuropathy is seen.  EMG evaluation of the  left lower extremity shows no abnormalities, there is no evidence of an overlying lumbosacral radiculopathy.  Jill Alexanders MD 09/14/2019 2:46 PM  [facility] [address] [place], [place] [address]  Phone [phone] Fax [phone]

## 2019-09-14 NOTE — Telephone Encounter (Signed)
I called pt to discuss her results. No answer, left a message asking her to call me back. 

## 2019-09-14 NOTE — Telephone Encounter (Signed)
-----   Message from [name] Age, MD sent at 09/14/2019  2:58 PM EST ----- Please call and advise the patient that the recent EMG and nerve conduction velocity test, which is the electrical nerve and muscle test we we performed, was reported as within normal limits. We checked for abnormal electrical discharges in the muscles or nerves of the lower extremities, and the report suggested normal findings. No further action is required on this test at this time.  At this juncture, the patient can follow-up with her primary care physician as scheduled.

## 2019-09-19 NOTE — Telephone Encounter (Signed)
I called pt and discussed her EMG/NCV results. Pt verbalized understanding of results. Pt had no questions at this time but was encouraged to call back if questions arise.

## 2019-09-25 MED FILL — PANTOPRAZOLE SOD DR 40 MG T: 40 | 30 days supply | Qty: 30 | Fill #2

## 2019-09-25 MED FILL — traZODone HCL 50 MG TABS: 50 | 30 days supply | Qty: 60 | Fill #2

## 2019-12-22 NOTE — Telephone Encounter (Signed)
complete

## 2020-03-20 ENCOUNTER — Other Ambulatory Visit: Payer: Self-pay | Admitting: Physician Assistant

## 2020-03-20 DIAGNOSIS — Z1231 Encounter for screening mammogram for malignant neoplasm of breast: Secondary | ICD-10-CM

## 2020-03-27 ENCOUNTER — Ambulatory Visit
Admission: RE | Admit: 2020-03-27 | Discharge: 2020-03-27 | Disposition: A | Discharge status: Home / Self Care | Payer: Medicaid Other | Source: Ambulatory Visit | Attending: Physician Assistant | Admitting: Physician Assistant

## 2020-03-27 ENCOUNTER — Other Ambulatory Visit: Payer: Self-pay

## 2020-03-27 DIAGNOSIS — Z1231 Encounter for screening mammogram for malignant neoplasm of breast: Secondary | ICD-10-CM

## 2020-03-29 MED FILL — FLUoxetine HCL 20 MG CAPS: 20 | 30 days supply | Qty: 30 | Fill #0

## 2020-04-11 ENCOUNTER — Other Ambulatory Visit (HOSPITAL_COMMUNITY): Payer: Self-pay | Admitting: Physician Assistant

## 2020-04-11 MED FILL — CYCLOBENZAPRINE HCL 5 MG TA: 5 | 10 days supply | Qty: 30 | Fill #0

## 2020-04-11 MED FILL — ATORVASTATIN CALCIUM 40 MG: 40 | 30 days supply | Qty: 30 | Fill #0

## 2020-04-26 ENCOUNTER — Encounter (HOSPITAL_COMMUNITY): Payer: Self-pay

## 2020-04-26 ENCOUNTER — Emergency Department (HOSPITAL_COMMUNITY)
Admission: EM | Admit: 2020-04-26 | Discharge: 2020-04-27 | Disposition: A | Discharge status: Home / Self Care | Payer: Medicaid Other | Attending: Emergency Medicine | Admitting: Emergency Medicine

## 2020-04-26 ENCOUNTER — Emergency Department (HOSPITAL_COMMUNITY): Payer: Medicaid Other

## 2020-04-26 ENCOUNTER — Other Ambulatory Visit: Payer: Self-pay

## 2020-04-26 DIAGNOSIS — I10 Essential (primary) hypertension: Secondary | ICD-10-CM | POA: Insufficient documentation

## 2020-04-26 DIAGNOSIS — E669 Obesity, unspecified: Secondary | ICD-10-CM | POA: Insufficient documentation

## 2020-04-26 DIAGNOSIS — Z79899 Other long term (current) drug therapy: Secondary | ICD-10-CM | POA: Insufficient documentation

## 2020-04-26 DIAGNOSIS — M5412 Radiculopathy, cervical region: Secondary | ICD-10-CM | POA: Diagnosis not present

## 2020-04-26 DIAGNOSIS — Z91040 Latex allergy status: Secondary | ICD-10-CM | POA: Diagnosis not present

## 2020-04-26 DIAGNOSIS — R079 Chest pain, unspecified: Secondary | ICD-10-CM

## 2020-04-26 DIAGNOSIS — R0789 Other chest pain: Secondary | ICD-10-CM | POA: Insufficient documentation

## 2020-04-26 LAB — BASIC METABOLIC PANEL
Anion gap: 10 (ref 5–15)
BUN: 12 mg/dL (ref 6–20)
CO2: 22 mmol/L (ref 22–32)
Calcium: 8.8 mg/dL — ABNORMAL LOW (ref 8.9–10.3)
Chloride: 110 mmol/L (ref 98–111)
Creatinine, Ser: 0.67 mg/dL (ref 0.44–1.00)
GFR calc Af Amer: >60 mL/min (ref >60)
GFR calc non Af Amer: >60 mL/min (ref >60)
Glucose, Bld: 93 mg/dL (ref 70–99)
Potassium: 3.2 mmol/L — ABNORMAL LOW (ref 3.5–5.1)
Sodium: 142 mmol/L (ref 135–145)

## 2020-04-26 LAB — CBC
HCT: 39.9 % (ref 36.0–46.0)
Hemoglobin: 13.3 g/dL (ref 12.0–15.0)
MCH: 27.7 pg (ref 26.0–34.0)
MCHC: 33.3 g/dL (ref 30.0–36.0)
MCV: 83.1 fL (ref 80.0–100.0)
Platelets: 311 10*3/uL (ref 150–400)
RBC: 4.8 MIL/uL (ref 3.87–5.11)
RDW: 11.9 % (ref 11.5–15.5)
WBC: 9.2 10*3/uL (ref 4.0–10.5)
nRBC: 0 % (ref 0.0–0.2)

## 2020-04-26 LAB — TROPONIN I (HIGH SENSITIVITY)
Troponin I (High Sensitivity): 2 ng/L (ref <18)
Troponin I (High Sensitivity): 3 ng/L (ref <18)

## 2020-04-26 LAB — I-STAT BETA HCG BLOOD, ED (MC, WL, AP ONLY): I-stat hCG, quantitative: <5 m[IU]/mL (ref <5)

## 2020-04-26 MED ORDER — SODIUM CHLORIDE 0.9% FLUSH: 3.0000 mL | Freq: Once | INTRAVENOUS | Status: DC

## 2020-04-26 NOTE — ED Triage Notes (Addendum)
Pt presents to ED with complaints of sudden onset CP and left shoulder pain since 1100 AM today. Pt states pain is sharp/ stabbing and radiates up neck and down back. Hx SVT 2017. ASA not given pta d/t pt reported allergy  20G RAC- 4mg  zofran given pta

## 2020-04-27 MED ORDER — METHOCARBAMOL 500 MG PO TABS
500.0000 mg | ORAL_TABLET | Freq: Four times a day (QID) | ORAL | 0 refills | Status: DC | PRN
Start: 2020-04-27 — End: 2021-05-30

## 2020-04-27 MED ORDER — PREDNISONE 20 MG PO TABS
20.0000 mg | ORAL_TABLET | Freq: Two times a day (BID) | ORAL | 0 refills | Status: DC
Start: 2020-04-27 — End: 2020-07-18

## 2020-04-27 MED FILL — FLUoxetine HCL 20 MG CAPS: 20 | 30 days supply | Qty: 30 | Fill #1

## 2020-04-27 MED FILL — METHOCARBAMOL 500 MG TABS: 500 | 5 days supply | Qty: 20 | Fill #0

## 2020-04-27 NOTE — ED Provider Notes (Signed)
Tanya [name]   CSN: [id number] Arrival date & time: 04/26/20  12/06/00     History Chief Complaint  Patient presents with  . Chest Pain    Tanya [name] is a 39 y.o. female.  HPI She presents for evaluation of pain in her left neck, left shoulder, and left arm.  During this time she has noticed some pain in her left chest and felt like her blood pressure was elevated.  This is been present for 2 days without known trauma.  No prior injuries to the head.  She works as a Psychologist, counselling.  She denies fever, chills, cough, shortness of breath, weakness or dizziness.  She has not tried anything for the discomfort.  There are no other known modifying factors.    Past Medical History:  Diagnosis Date  . Anemia   . Anxiety   . Arthritis    L knee OA  . Depression    history of depression in 2010-12-06 with death of mother  . Hyperlipidemia   . Hypertension   . Migraine   . Obesity 02/2017   has had gastric sleeve    Patient Active Problem List   Diagnosis Date Noted  . Generalized anxiety disorder 05/12/2019  . Insomnia due to psychological stress 05/12/2019  . Gastroesophageal reflux disease without esophagitis 05/12/2019  . Volume depletion 01/19/2019  . Menorrhagia 03/25/2018  . Nausea without vomiting 03/09/2018  . LUQ abdominal pain 03/09/2018  . Bowel habit changes 12/08/2017  . Bloating 12/08/2017  . Abdominal pain 09/04/2017  . Anal fissure 09/04/2017  . Constipation 09/04/2017  . S/P laparoscopic sleeve gastrectomy May 2018 02/17/2017  . Iron deficiency anemia due to chronic blood loss 02/01/2017  . SVT (supraventricular tachycardia) ([place])   . Anemia due to vitamin B12 deficiency 12/08/2016  . Menometrorrhagia 12/05/2016  . Morbid obesity ([place]) 12/05/2016  . Chronic migraine without aura without status migrainosus, not intractable 12/05/2016    Past Surgical History:  Procedure Laterality Date  . ABDOMINAL  HYSTERECTOMY    . ANAL RECTAL MANOMETRY N/A 01/11/2018   Procedure: ANO RECTAL MANOMETRY;  Surgeon: Mauri Pole, MD;  Location: [facility];  Service: Endoscopy;  Laterality: N/A;  . CARPAL TUNNEL RELEASE Bilateral   . CESAREAN SECTION     x 1  . CHOLECYSTECTOMY    . KNEE SURGERY Left   . LAPAROSCOPIC GASTRIC SLEEVE RESECTION N/A 02/17/2017   Procedure: LAPAROSCOPIC GASTRIC SLEEVE RESECTION, UPPER ENDO;  Surgeon: Johnathan Hausen, MD;  Location: [facility];  Service: General;  Laterality: N/A;  . LAPAROSCOPIC UNILATERAL SALPINGECTOMY Left 06/10/2014   Procedure: LAPAROSCOPIC Left SALPINGECTOMY, removal of ectopic pregnancy;  Surgeon: Guss Bunde, MD;  Location: [facility];  Service: Gynecology;  Laterality: Left;  . TUBAL LIGATION    . VAGINAL HYSTERECTOMY Bilateral 03/25/2018   Procedure: HYSTERECTOMY VAGINAL, RIGHT SALPINGECTOMY;  Surgeon: Eldred Manges, MD;  Location: [facility];  Service: Gynecology;  Laterality: Bilateral;  ERAS  . WISDOM TOOTH EXTRACTION       OB History    Gravida  3   Para  2   Term  1   Preterm      AB      Living  2     SAB      TAB      Ectopic      Multiple      Live Births  2  Family History  Problem Relation Age of Onset  . Breast cancer Mother 65  . Cancer Mother   . Prostate cancer Father 36  . Colon polyps Father   . Breast cancer Sister 5  . Breast cancer Maternal Aunt        dx in her 42s; BRCA neg  . Prostate cancer Paternal Uncle   . Diabetes Paternal Uncle   . Heart disease Maternal Grandmother   . Diabetes Paternal Grandmother   . Prostate cancer Paternal Grandfather   . Ovarian cancer Sister 50       found during her pregnancy  . Prostate cancer Paternal Uncle   . Prostate cancer Paternal Uncle   . Cancer Other   . Stroke Other   . Diabetes Other   . Coronary artery disease Other   . Colon cancer Neg Hx   . Esophageal cancer Neg Hx   . Rectal cancer Neg Hx     Social History    Tobacco Use  . Smoking status: Never Smoker  . Smokeless tobacco: Never Used  Vaping Use  . Vaping Use: Never used  Substance Use Topics  . Alcohol use: Yes    Comment: socially  . Drug use: No    Home Medications Prior to Admission medications   Medication Sig Start Date End Date Taking? Authorizing Provider  atorvastatin (LIPITOR) 40 MG tablet Take 40 mg by mouth daily. 04/11/20  Yes [provider]  Calcium 600-400 MG-UNIT CHEW Chew 1 tablet by mouth daily.   Yes [provider]  cyclobenzaprine (FLEXERIL) 5 MG tablet Take 5 mg by mouth 3 (three) times daily as needed for muscle spasms. 04/11/20  Yes [provider]  FLUoxetine (PROZAC) 20 MG capsule Take 20 mg by mouth daily. 03/29/20  Yes [provider]  hydrOXYzine (ATARAX/VISTARIL) 25 MG tablet Take 1-2 tablets (25-50 mg total) by mouth at bedtime as needed. 05/12/19  Yes Rutherford Guys, MD  Multiple Vitamins-Minerals (BARIATRIC MULTIVITAMINS/IRON) CAPS Take 1 tablet by mouth daily.    Yes [provider]  pantoprazole (PROTONIX) 40 MG tablet Take 1 tablet (40 mg total) by mouth daily. 05/12/19  Yes Rutherford Guys, MD  topiramate (TOPAMAX) 100 MG tablet Take 2.5 tablets (250 mg total) by mouth 2 (two) times daily. 05/12/19 04/27/20 Yes Rutherford Guys, MD  traZODone (DESYREL) 50 MG tablet Take 1-2 tablets (50-100 mg total) by mouth at bedtime as needed. for sleep. 05/12/19  Yes Rutherford Guys, MD  vitamin B-12 1000 MCG tablet Take 1 tablet (1,000 mcg total) by mouth daily. 12/13/16  Yes Barton Dubois, MD  methocarbamol (ROBAXIN) 500 MG tablet Take 1 tablet (500 mg total) by mouth every 6 (six) hours as needed for muscle spasms. 04/27/20   Daleen Bo, MD  predniSONE (DELTASONE) 20 MG tablet Take 1 tablet (20 mg total) by mouth 2 (two) times daily. 04/27/20   Daleen Bo, MD    Allergies    Imitrex [sumatriptan], Zomig [zolmitriptan], Latex, Aspirin, Ibuprofen, and Sulfa  antibiotics  Review of Systems   Review of Systems  All other systems reviewed and are negative.   Physical Exam Updated Vital Signs BP 105/66 (BP Location: Right Arm)   Pulse 72   Temp 97.9 F (36.6 C) (Oral)   Resp 14   Ht '5\' 2"'  (1.575 m)   Wt 90.3 kg   LMP 03/12/2018 (Exact Date)   SpO2 100%   BMI 36.40 kg/m   Physical Exam Vitals and  nursing [name] reviewed.  Constitutional:      General: She is not in acute distress.    Appearance: She is well-developed. She is obese. She is not ill-appearing, toxic-appearing or diaphoretic.  HENT:     Head: Normocephalic and atraumatic.     Right Ear: External ear normal.     Left Ear: External ear normal.  Eyes:     Conjunctiva/sclera: Conjunctivae normal.     Pupils: Pupils are equal, round, and reactive to light.  Neck:     Trachea: Phonation normal.  Cardiovascular:     Rate and Rhythm: Normal rate and regular rhythm.     Heart sounds: Normal heart sounds.  Pulmonary:     Effort: Pulmonary effort is normal.     Breath sounds: Normal breath sounds.  Chest:     Chest wall: No tenderness.  Abdominal:     General: There is no distension.     Palpations: Abdomen is soft.     Tenderness: There is no abdominal tenderness.  Musculoskeletal:        General: Tenderness (Mild left trapezius tenderness.  No tenderness over the cervical spine.) present. No swelling or deformity. Normal range of motion.     Cervical back: Normal range of motion and neck supple.  Skin:    General: Skin is warm and dry.  Neurological:     Mental Status: She is alert and oriented to person, place, and time.     Cranial Nerves: No cranial nerve deficit.     Sensory: No sensory deficit.     Motor: No abnormal muscle tone.     Coordination: Coordination normal.  Psychiatric:        Mood and Affect: Mood normal.        Behavior: Behavior normal.        Thought Content: Thought content normal.        Judgment: Judgment normal.     ED Results /  Procedures / Treatments   Labs (all labs ordered are listed, but only abnormal results are displayed) Labs Reviewed  BASIC METABOLIC PANEL - Abnormal; Notable for the following components:      Result Value   Potassium 3.2 (*)    Calcium 8.8 (*)    All other components within normal limits  CBC  I-STAT BETA HCG BLOOD, ED ([facility], [facility], [facility] ONLY)  TROPONIN I (HIGH SENSITIVITY)  TROPONIN I (HIGH SENSITIVITY)    EKG EKG Interpretation  Date/Time:  Thursday April 26 2020 18:04:35 EDT Ventricular Rate:  80 PR Interval:  122 QRS Duration: 70 QT Interval:  356 QTC Calculation: 410 R Axis:   23 Text Interpretation: Normal sinus rhythm Possible Anterior infarct , age undetermined Abnormal ECG since last tracing no significant change Confirmed by Daleen Bo [phone]) on 04/27/2020 8:22:51 AM   Radiology DG Chest 2 View  Result Date: 04/26/2020 CLINICAL DATA:  Chest pain EXAM: CHEST - 2 VIEW COMPARISON:  01/03/2019 FINDINGS: Lungs are clear. No pneumothorax or pleural effusion. Cardiomediastinal silhouette unremarkable. The pulmonary vascularity is normal. No acute bone abnormality. IMPRESSION: No active cardiopulmonary disease. Electronically Signed   By: Fidela Salisbury MD   On: 04/26/2020 19:03    Procedures Procedures (including critical care time)  Medications Ordered in ED Medications  sodium chloride flush (NS) 0.9 % injection 3 mL (has no administration in time range)    ED Course  I have reviewed the triage vital signs and the nursing notes.  Pertinent labs & imaging results that  were available during my care of the patient were reviewed by me and considered in my medical decision making (see chart for details).    MDM Rules/Calculators/A&P                           Patient Vitals for the past 24 hrs:  BP Temp Temp src Pulse Resp SpO2 Height Weight  04/27/20 0911 105/66 97.9 F (36.6 C) Oral 72 14 100 % -- --  04/27/20 0756 (!) 134/83 -- -- 57 16 100 % -- --  04/27/20  0615 (!) 122/95 -- -- 64 20 100 % -- --  04/27/20 0037 (!) 140/96 -- -- 72 16 100 % -- --  04/26/20 2205 (!) 122/87 98.9 F (37.2 C) Oral 71 16 100 % -- --  04/26/20 2053 (!) 127/99 98.2 F (36.8 C) Oral 83 16 100 % -- --  04/26/20 1916 127/81 -- -- 90 20 100 % -- --  04/26/20 1811 (!) 124/89 (!) 97.3 F (36.3 C) Oral 91 20 100 % '5\' 2"'  (1.575 m) 90.3 kg  04/26/20 1806 -- -- -- -- -- 98 % -- --    At discharge- reevaluation with update and discussion. After initial assessment and treatment, an updated evaluation reveals no further complaints, findings discussed and questions answered. Daleen Bo   Medical Decision Making:  This patient is presenting for evaluation of neck and chest pain, which does require a range of treatment options, and is a complaint that involves a moderate risk of morbidity and mortality. The differential diagnoses include musculoskeletal pain, cardiac disease, radiculopathy. I decided to review old records, and in summary previously healthy young female presenting with subacute symptoms of pain in the neck and chest.  I did not require additional historical information from anyone. [facility] Clinical Laboratory Tests Ordered, included CBC, Metabolic panel and Troponin, pregnancy. Review indicates normal findings with exception of slightly low potassium.. Radiologic Tests Ordered, included chest x-ray.  I independently Visualized: Radiographic images, which show no infiltrate or  Cardiac Monitor Tracing which shows normal sinus rhythm    Critical Interventions-clinical evaluation, laboratory testing, chest x-ray, EKG, observation reassessment.  After These Interventions, the Patient was reevaluated and was found stable for discharge.  Symptoms likely related mostly to cervical radiculopathy left-sided lower.  Doubt ACS, PE, pneumonia, hypertensive crisis, spinal myelopathy.  CRITICAL CARE-no Performed by: Daleen Bo  Nursing Notes Reviewed/ Care  Coordinated Applicable Imaging Reviewed Interpretation of Laboratory Data incorporated into ED treatment  The patient appears reasonably screened and/or stabilized for discharge and I doubt any other medical condition or other [facility] requiring further screening, evaluation, or treatment in the ED at this time prior to discharge.  Plan: Home Medications-continue usual medicines.; Home Treatments-heat to affected area; return here if the recommended treatment, does not improve the symptoms; Recommended follow up-PCP, as needed     Final Clinical Impression(s) / ED Diagnoses Final diagnoses:  Cervical radiculopathy  Nonspecific chest pain    Rx / DC Orders ED Discharge Orders         Ordered    methocarbamol (ROBAXIN) 500 MG tablet  Every 6 hours PRN     Discontinue  Reprint     04/27/20 0903    predniSONE (DELTASONE) 20 MG tablet  2 times daily     Discontinue  Reprint     04/27/20 0903           Daleen Bo, MD 04/27/20 [phone]

## 2020-04-27 NOTE — Discharge Instructions (Signed)
The testing today did not show any serious problems with your heart, or blood pressure.  The pain in your neck and left arm is likely from a pinched nerve in the neck.  To treat this we are prescribing prednisone.  We are also giving her prescription for muscle relaxer to use when not working or driving.  Use heat on the sore area can help.  Follow-up with your doctor for checkup if not better in a week or so.

## 2020-05-15 MED FILL — ATORVASTATIN CALCIUM 40 MG: 40 | 30 days supply | Qty: 30 | Fill #1

## 2020-06-12 MED FILL — ATORVASTATIN CALCIUM 40 MG: 40 | 30 days supply | Qty: 30 | Fill #2

## 2020-06-12 MED FILL — FLUoxetine HCL 20 MG CAPS: 20 | 30 days supply | Qty: 30 | Fill #2

## 2020-06-28 MED FILL — busPIRone HCL 10 MG TABS: 10 | 30 days supply | Qty: 30 | Fill #0

## 2020-06-28 MED FILL — FLUoxetine HCL 40 MG CAPS: 40 | 90 days supply | Qty: 90 | Fill #0

## 2020-07-04 ENCOUNTER — Ambulatory Visit: Payer: Medicaid Other | Admitting: Cardiology

## 2020-07-15 NOTE — Progress Notes (Signed)
Cardiology Office Note:    Date:  07/18/2020   ID:  Tanya [name], DOB August 06, 1981, MRN [id number]  PCP:  Willeen Niece, [place] Cardiologist:  No primary care provider on file.  [facility] Electrophysiologist:  None   Referring MD: Willeen Niece, PA    History of Present Illness:    Tanya [name] is a 39 y.o. female with a hx of anxiety, depression, HTN, HLD, SVT, and obesity who was referred by Willeen Niece, PA for evaluation of palpitations.  [name] note dated 06/06/2020 reviewed. Patient has been having palpitations for the past couple of months. Initially thought to be related to Prozac , however, symptoms persisted despite holding the medication. Has a history of SVT in the past and this feels similar to her symptoms back in 01-25-17 when she was diagnosed with SVT. Each episode lasts about 2-4 minutes with HR 110-170 some associated lightheadedness when HR very high. Occurring several times per day. No known triggers and can occur anytime. No syncope, chest pain, shortness of breath, exertional dyspnea/chest pain, LE edema, nausea or vomiting.  Diagnosed with SVT about 4 years ago at [facility] a couple of nights in the hospital for monitoring and she did well and was discharged home without medications. Was doing well without symptoms until a couple of months when palpitations recurred as detailed above.  Has had a sleep study in the past and was told she does not have OSA.  Family History: Mother-HTN. Diabetes runs in the family. No known other CV disease.   Labs 2020: TSH 2.54, ALT 30, A1C 5.2, HgB 13.3, K 3.2, Plt 311 TC 286, HDL 48, LDL 221-->201, TG 86  Past Medical History:  Diagnosis Date   Anemia    Anxiety    Arthritis    L knee OA   Depression    history of depression in 2011/01/26 with death of mother   Hyperlipidemia    Hypertension    Migraine    Obesity 02/2017   has had gastric sleeve    Past Surgical  History:  Procedure Laterality Date   ABDOMINAL HYSTERECTOMY     ANAL RECTAL MANOMETRY N/A 01/11/2018   Procedure: ANO RECTAL MANOMETRY;  Surgeon: Mauri Pole, MD;  Location: [facility];  Service: Endoscopy;  Laterality: N/A;   CARPAL TUNNEL RELEASE Bilateral    CESAREAN SECTION     x 1   CHOLECYSTECTOMY     KNEE SURGERY Left    LAPAROSCOPIC GASTRIC SLEEVE RESECTION N/A 02/17/2017   Procedure: LAPAROSCOPIC GASTRIC SLEEVE RESECTION, UPPER ENDO;  Surgeon: Johnathan Hausen, MD;  Location: [facility];  Service: General;  Laterality: N/A;   LAPAROSCOPIC UNILATERAL SALPINGECTOMY Left 06/10/2014   Procedure: LAPAROSCOPIC Left SALPINGECTOMY, removal of ectopic pregnancy;  Surgeon: Guss Bunde, MD;  Location: [facility];  Service: Gynecology;  Laterality: Left;   TUBAL LIGATION     VAGINAL HYSTERECTOMY Bilateral 03/25/2018   Procedure: HYSTERECTOMY VAGINAL, RIGHT SALPINGECTOMY;  Surgeon: Eldred Manges, MD;  Location: [place];  Service: Gynecology;  Laterality: Bilateral;  ERAS   WISDOM TOOTH EXTRACTION      Current Medications: Current Meds  Medication Sig   atorvastatin (LIPITOR) 40 MG tablet Take 40 mg by mouth daily.   Calcium 600-400 MG-UNIT CHEW Chew 1 tablet by mouth daily.   FLUoxetine (PROZAC) 20 MG capsule Take 40 mg by mouth daily.    methocarbamol (ROBAXIN) 500 MG tablet Take 1 tablet (500 mg total)  by mouth every 6 (six) hours as needed for muscle spasms.   Multiple Vitamins-Minerals (BARIATRIC MULTIVITAMINS/IRON) CAPS Take 1 tablet by mouth daily.    pantoprazole (PROTONIX) 40 MG tablet Take 1 tablet (40 mg total) by mouth daily.   topiramate (TOPAMAX) 100 MG tablet Take 2.5 tablets (250 mg total) by mouth 2 (two) times daily.   traZODone (DESYREL) 50 MG tablet Take 1-2 tablets (50-100 mg total) by mouth at bedtime as needed. for sleep.   vitamin B-12 1000 MCG tablet Take 1 tablet (1,000 mcg total) by mouth daily.   Current  Facility-Administered Medications for the 07/18/20 encounter (Office Visit) with Freada Bergeron, MD  Medication   cyanocobalamin ((VITAMIN B-12)) injection 1,000 mcg     Allergies:   Imitrex [sumatriptan], Zolmitriptan, Latex, Sulfa antibiotics, Sulfur, Aspirin, and Ibuprofen   Social History   Socioeconomic History   Marital status: Married    Spouse name: Jeneen Rinks   Number of children: 2   Years of education: Not on file   Highest education level: Not on file  Occupational History   Occupation: Research scientist (life sciences): [employer]  Tobacco Use   Smoking status: Never Smoker   Smokeless tobacco: Never Used  Scientific laboratory technician Use: Never used  Substance and Sexual Activity   Alcohol use: Yes    Comment: socially   Drug use: No   Sexual activity: Yes    Birth control/protection: Surgical    Comment: tubal ligation 2002  Other Topics Concern   Not on file  Social History Narrative   Marital status: married x 6 years; second marriage      Children: 2 children (4, 46); 1 stepgrandaughter; 1 stepdaughter      Lives: with husband, one son      Employment: Chartered certified accountant since 2017; fifth floor surgical floor; third shift 3 twelve hour shifts      Tobacco: none      Alcohol: none      Drugs; none      Exercise: none   Social Determinants of Radio broadcast assistant Strain:    Difficulty of Paying Living Expenses: Not on file  Food Insecurity:    Worried About Charity fundraiser in the Last Year: Not on file   [place] in the Last Year: Not on file  Transportation Needs:    Lack of Transportation (Medical): Not on file   Lack of Transportation (Non-Medical): Not on file  Physical Activity:    Days of Exercise per Week: Not on file   Minutes of Exercise per Session: Not on file  Stress:    Feeling of Stress : Not on file  Social Connections:    Frequency of Communication with Friends and Family: Not on file   Frequency  of Social Gatherings with Friends and Family: Not on file   Attends Religious Services: Not on file   Active Member of Clubs or Organizations: Not on file   Attends Archivist Meetings: Not on file   Marital Status: Not on file     Family History: The patient's family history includes Breast cancer in her maternal aunt; Breast cancer (age of onset: 29) in her sister; Breast cancer (age of onset: 70) in her mother; Cancer in her mother and another family member; Colon polyps in her father; Coronary artery disease in an other family member; Diabetes in her paternal grandmother, paternal uncle, and another family member;  Heart disease in her maternal grandmother; Ovarian cancer (age of onset: 64) in her sister; Prostate cancer in her paternal grandfather, paternal uncle, paternal uncle, and paternal uncle; Prostate cancer (age of onset: 65) in her father; Stroke in an other family member. There is no history of Colon cancer, Esophageal cancer, or Rectal cancer.  ROS:   Please see the history of present illness.    The patient denies chest pain, chest pressure, dyspnea at rest or with exertion, PND, orthopnea, or leg swelling. Denies cough, fever, chills. Denies nausea, vomiting. Denies syncope or presyncope. Denies dizziness or lightheadedness. Denies snoring.  EKGs/Labs/Other Studies Reviewed:    The following studies were reviewed today: TTE Jan 09, 2017: Study Conclusions   - Left ventricle: The cavity size was normal. Wall thickness was  increased in a pattern of mild LVH. Systolic function was normal.  The estimated ejection fraction was in the range of 60% to 65%.  Wall motion was normal; there were no regional wall motion  abnormalities. Doppler parameters are consistent with high  ventricular filling pressure.   Impressions:   - Normal LV systolic function; mild LVH; grade 1 diastolic  function.   Sleep study 01-09-17: RECOMMENDATIONS:   1. This study does not  demonstrate any significant obstructive or  central sleep disordered breathing. There was evidence of mild,  REM sleep related OSA, for which CPAP therapy is not warranted;  mild to moderate snoring was noted. Avoidance of the supine sleep  position and weight loss will likely help the snoring and mild  REM related OSA.  2. This study shows sleep fragmentation and abnormal sleep stage  percentages; these are nonspecific findings and per se do not  signify an intrinsic sleep disorder or a cause for the patient's  sleep-related symptoms. Causes include (but are not limited to)  the first night effect of the sleep study, circadian rhythm  disturbances, medication effect or an underlying mood disorder or  medical problem.  3. The patient should be cautioned not to drive, work at heights,  or operate dangerous or heavy equipment when tired or sleepy.  Review and reiteration of good sleep hygiene measures should be  pursued with any patient.  4. The patient will be offered a follow-up appointment at [facility] for  discussion of the test results and further management strategies.  The referring provider will be notified of the test results.   CT abdomen/pelvis 01/10/2019: FINDINGS: Lower chest: Lung bases are normal.  Hepatobiliary: Previous cholecystectomy. Liver and biliary tree are normal.  Pancreas: Normal.  Spleen: Normal.  Adrenals/Urinary Tract: Adrenal glands are normal. Kidneys are normal in size without hydronephrosis or nephrolithiasis. Ureters and bladder are normal.  Stomach/Bowel: Evidence of previous gastric bypass surgery. Small bowel is normal. Appendix is normal. Colon is normal.  Vascular/Lymphatic: Minimal calcified plaque over the infrarenal abdominal aorta. No adenopathy.  Reproductive: Previous hysterectomy.  Other: Tiny amount of free fluid in the pelvis. No focal inflammatory change. No free peritoneal air.  Musculoskeletal: No acute  findings.  IMPRESSION: No acute findings in the abdomen/pelvis  EKG:  EKG is  ordered today.  The ekg ordered today demonstrates NSR with HR 64.  Recent Labs: 04/26/2020: BUN 12; Creatinine, Ser 0.67; Hemoglobin 13.3; Platelets 311; Potassium 3.2; Sodium 142  Recent Lipid Panel    Component Value Date/Time   CHOL (H) 10/05/2007 0545    235        ATP III CLASSIFICATION:  <200     mg/dL   Desirable  200-239  mg/dL   Borderline High  >=240    mg/dL   High   TRIG 153 (H) 10/05/2007 0545   HDL 33 (L) 10/05/2007 0545   CHOLHDL 7.1 10/05/2007 0545   VLDL 31 10/05/2007 0545   LDLCALC (H) 10/05/2007 0545    171        Total Cholesterol/HDL:CHD Risk Coronary Heart Disease Risk Table                     Men   Women  1/2 Average Risk   3.4   3.3      Physical Exam:    VS:  BP 124/76    Pulse 64    Ht 5\' 2"  (1.575 m)    Wt 220 lb 12.8 oz (100.2 kg)    LMP 03/12/2018 (Exact Date)    BMI 40.38 kg/m     Wt Readings from Last 3 Encounters:  07/18/20 220 lb 12.8 oz (100.2 kg)  04/26/20 199 lb (90.3 kg)  07/27/19 188 lb (85.3 kg)     GEN:  Well nourished, well developed in no acute distress HEENT: Normal NECK: No JVD; No carotid bruits LYMPHATICS: No lymphadenopathy CARDIAC: RRR, no murmurs, rubs, gallops RESPIRATORY:  Clear to auscultation without rales, wheezing or rhonchi  ABDOMEN: Soft, non-tender, non-distended MUSCULOSKELETAL:  No edema; No deformity  SKIN: Warm and dry NEUROLOGIC:  Alert and oriented x 3 PSYCHIATRIC:  Normal affect   ASSESSMENT:    1. Palpitations   2. Hyperlipidemia, unspecified hyperlipidemia type   3. Supraventricular tachycardia (HCC)    PLAN:    In order of problems listed above:  #Palpitations: #History of SVT: Patient with history of SVT in 2018; not on medications. Now with increasing palpitations and HR ranging from 110-170s lasting 2-4 minutes at a time. Occurring several times per day. Some lightheadedness but no chest pain, SOB or  syncope.  -Zio patch x 7 days -Metop tartrate 25mg  BID with additional dose as needed if sustained palpitations during the day -Counseled about vagal maneuvers to help treat symptoms including blowing into a straw, blowing hard onto her thumb or bearing down -TTE in 2018 with normal BiV function and no significant valvular abnormalities; no need to repeat -Pending rhythm, can consider ablation in the future however patient would like to pursue medical management at this time  #Hyperlipidemia: LDL 221-->201. Previously in 300s (? Genetic). HDL 48.  -Continue atorvastatin 40mg  -Repeat lipid panel -If LDL>150, will start zetia 10mg  daily -May need PSCK-9 inhibitor in future -Counseled about mediterranean diet and exercise as detailed below  Exercise recommendations: Goal of exercising for at least 30 minutes a day, at least 5 times per week.  Please exercise to a moderate exertion.  This means that while exercising it is difficult to speak in full sentences, however you are not so short of breath that you feel you must stop, and not so comfortable that you can carry on a full conversation.  Exertion level should be approximately a 5/10, if 10 is the most exertion you can perform.  Diet recommendations: Recommend a heart healthy diet such as the Mediterranean diet.  This diet consists of plant based foods, healthy fats, lean meats, olive oil.  It suggests limiting the intake of simple carbohydrates such as white breads, pastries, and pastas.  It also limits the amount of red meat, wine, and dairy products such as cheese that one should consume on a daily basis.   Medication Adjustments/Labs and  Tests Ordered: Current medicines are reviewed at length with the patient today.  Concerns regarding medicines are outlined above.  Orders Placed This Encounter  Procedures   Lipid Profile   LONG TERM MONITOR (3-14 DAYS)   EKG 12-Lead   Meds ordered this encounter  Medications   metoprolol  tartrate (LOPRESSOR) 25 MG tablet    Sig: Take 1 tablet (25 mg total) by mouth two times daily. You may take 1 extra tablet by mouth, only as needed for palpitations.    Dispense:  270 tablet    Refill:  2    Patient Instructions  Medication Instructions:   START TAKING METOPROLOL TARTRATE 25 MG BY MOUTH TWO TIMES DAILY.  YOU MAY TAKE ONE EXTRA TABLET BY MOUTH, ONLY AS NEEDED FOR PALPITATIONS.   *If you need a refill on your cardiac medications before your next appointment, please call your pharmacy*  Lab Work:  THIS WEEK OR IN THE NEAR FUTURE TO CHECK--LIPIDS--PLEASE COME FASTING TO THIS LAB APPOINTMENT.   If you have labs (blood work) drawn today and your tests are completely normal, you will receive your results only by:  [place] (if you have MyChart) OR  A paper copy in the mail If you have any lab test that is abnormal or we need to change your treatment, we will call you to review the results.  Testing/Procedures:  Bryn Gulling- Long Term Monitor Instructions   Your physician has requested you wear your ZIO patch monitor_7______days.   This is a single patch monitor.  [employer] supplies one patch monitor per enrollment.  Additional stickers are not available.   Please do not apply patch if you will be having a Nuclear Stress Test, Echocardiogram, Cardiac CT, MRI, or Chest Xray during the time frame you would be wearing the monitor. The patch cannot be worn during these tests.  You cannot remove and re-apply the ZIO XT patch monitor.   Your ZIO patch monitor will be sent USPS Priority mail from [facility] directly to your home address. The monitor may also be mailed to a PO BOX if home delivery is not available.   It may take 3-5 days to receive your monitor after you have been enrolled.   Once you have received you monitor, please review enclosed instructions.  Your monitor has already been registered assigning a specific monitor serial # to you.   Applying the  monitor   Shave hair from upper left chest.   Hold abrader disc by orange tab.  Rub abrader in 40 strokes over left upper chest as indicated in your monitor instructions.   Clean area with 4 enclosed alcohol pads .  Use all pads to assure are is cleaned thoroughly.  Let dry.   Apply patch as indicated in monitor instructions.  Patch will be place under collarbone on left side of chest with arrow pointing upward.   Rub patch adhesive wings for 2 minutes.Remove white label marked "1".  Remove white label marked "2".  Rub patch adhesive wings for 2 additional minutes.   While looking in a mirror, press and release button in center of patch.  A small green light will flash 3-4 times .  This will be your only indicator the monitor has been turned on.     Do not shower for the first 24 hours.  You may shower after the first 24 hours.   Press button if you feel a symptom. You will hear a small click.  Record Date, Time  and Symptom in the Patient Log Book.   When you are ready to remove patch, follow instructions on last 2 pages of Patient Log Book.  Stick patch monitor onto last page of Patient Log Book.   Place Patient Log Book in [place] box.  Use locking tab on box and tape box closed securely.  The [employer] has [employer] on it.  Please place in mailbox as soon as possible.  Your physician should have your test results approximately 7 days after the monitor has been mailed back to [facility].   Call [place] at [phone] if you have questions regarding your ZIO XT patch monitor.  Call them immediately if you see an orange light blinking on your monitor.   If your monitor falls off in less than 4 days contact our Monitor department at [phone].  If your monitor becomes loose or falls off after 4 days call [employer] at [phone] for suggestions on securing your monitor.    Follow-Up: At [facility], you and your health needs are our priority.   As part of our continuing mission to provide you with exceptional heart care, we have created designated Provider Care Teams.  These Care Teams include your primary Cardiologist (physician) and Advanced Practice Providers (APPs -  Physician Assistants and Nurse Practitioners) who all work together to provide you with the care you need, when you need it.  We recommend signing up for the patient portal called "MyChart".  Sign up information is provided on this After Visit Summary.  MyChart is used to connect with patients for Virtual Visits (Telemedicine).  Patients are able to view lab/test results, encounter notes, upcoming appointments, etc.  Non-urgent messages can be sent to your provider as well.   To learn more about what you can do with MyChart, go to [email or web address].    Your next appointment:   6 month(s)  The format for your next appointment:   In Person  Provider:   Gwyndolyn Kaufman, MD        Signed, Freada Bergeron, MD  07/18/2020 10:10 AM    [place]

## 2020-07-18 ENCOUNTER — Encounter: Payer: Self-pay | Admitting: Cardiology

## 2020-07-18 ENCOUNTER — Other Ambulatory Visit (HOSPITAL_COMMUNITY): Payer: Self-pay | Admitting: Cardiovascular Disease

## 2020-07-18 ENCOUNTER — Telehealth: Payer: Self-pay | Admitting: Radiology

## 2020-07-18 ENCOUNTER — Other Ambulatory Visit: Payer: Self-pay

## 2020-07-18 ENCOUNTER — Ambulatory Visit (INDEPENDENT_AMBULATORY_CARE_PROVIDER_SITE_OTHER): Payer: Medicaid Other | Admitting: Cardiology

## 2020-07-18 VITALS — BP 124/76 | HR 64 | Ht 62.0 in | Wt 220.8 lb

## 2020-07-18 DIAGNOSIS — E785 Hyperlipidemia, unspecified: Secondary | ICD-10-CM | POA: Diagnosis not present

## 2020-07-18 DIAGNOSIS — I471 Supraventricular tachycardia: Secondary | ICD-10-CM

## 2020-07-18 DIAGNOSIS — R002 Palpitations: Secondary | ICD-10-CM | POA: Diagnosis not present

## 2020-07-18 MED ORDER — METOPROLOL TARTRATE 25 MG PO TABS: ORAL_TABLET | ORAL | 2 refills | Status: DC

## 2020-07-18 NOTE — Telephone Encounter (Signed)
Enrolled patient for a 7 Day Zio XT Monitor to be mailed to patients home.

## 2020-07-18 NOTE — Patient Instructions (Signed)
Medication Instructions:   START TAKING METOPROLOL TARTRATE 25 MG BY MOUTH TWO TIMES DAILY.  YOU MAY TAKE ONE EXTRA TABLET BY MOUTH, ONLY AS NEEDED FOR PALPITATIONS.   *If you need a refill on your cardiac medications before your next appointment, please call your pharmacy*  Lab Work:  THIS WEEK OR IN THE NEAR FUTURE TO CHECK--LIPIDS--PLEASE COME FASTING TO THIS LAB APPOINTMENT.   If you have labs (blood work) drawn today and your tests are completely normal, you will receive your results only by:  [place] (if you have MyChart) OR  A paper copy in the mail If you have any lab test that is abnormal or we need to change your treatment, we will call you to review the results.  Testing/Procedures:  Bryn Gulling- Long Term Monitor Instructions   Your physician has requested you wear your ZIO patch monitor_7______days.   This is a single patch monitor.  [employer] supplies one patch monitor per enrollment.  Additional stickers are not available.   Please do not apply patch if you will be having a Nuclear Stress Test, Echocardiogram, Cardiac CT, MRI, or Chest Xray during the time frame you would be wearing the monitor. The patch cannot be worn during these tests.  You cannot remove and re-apply the ZIO XT patch monitor.   Your ZIO patch monitor will be sent USPS Priority mail from [facility] directly to your home address. The monitor may also be mailed to a PO BOX if home delivery is not available.   It may take 3-5 days to receive your monitor after you have been enrolled.   Once you have received you monitor, please review enclosed instructions.  Your monitor has already been registered assigning a specific monitor serial # to you.   Applying the monitor   Shave hair from upper left chest.   Hold abrader disc by orange tab.  Rub abrader in 40 strokes over left upper chest as indicated in your monitor instructions.   Clean area with 4 enclosed alcohol pads .  Use all pads to  assure are is cleaned thoroughly.  Let dry.   Apply patch as indicated in monitor instructions.  Patch will be place under collarbone on left side of chest with arrow pointing upward.   Rub patch adhesive wings for 2 minutes.Remove white label marked "1".  Remove white label marked "2".  Rub patch adhesive wings for 2 additional minutes.   While looking in a mirror, press and release button in center of patch.  A small green light will flash 3-4 times .  This will be your only indicator the monitor has been turned on.     Do not shower for the first 24 hours.  You may shower after the first 24 hours.   Press button if you feel a symptom. You will hear a small click.  Record Date, Time and Symptom in the Patient Log Book.   When you are ready to remove patch, follow instructions on last 2 pages of Patient Log Book.  Stick patch monitor onto last page of Patient Log Book.   Place Patient Log Book in [place] box.  Use locking tab on box and tape box closed securely.  The [employer] has [employer] on it.  Please place in mailbox as soon as possible.  Your physician should have your test results approximately 7 days after the monitor has been mailed back to [facility].   Call [place] at  [phone] if you have questions regarding your ZIO XT patch monitor.  Call them immediately if you see an orange light blinking on your monitor.   If your monitor falls off in less than 4 days contact our Monitor department at [phone].  If your monitor becomes loose or falls off after 4 days call [employer] at [phone] for suggestions on securing your monitor.    Follow-Up: At [facility], you and your health needs are our priority.  As part of our continuing mission to provide you with exceptional heart care, we have created designated Provider Care Teams.  These Care Teams include your primary Cardiologist (physician) and Advanced Practice Providers (APPs -   Physician Assistants and Nurse Practitioners) who all work together to provide you with the care you need, when you need it.  We recommend signing up for the patient portal called "MyChart".  Sign up information is provided on this After Visit Summary.  MyChart is used to connect with patients for Virtual Visits (Telemedicine).  Patients are able to view lab/test results, encounter notes, upcoming appointments, etc.  Non-urgent messages can be sent to your provider as well.   To learn more about what you can do with MyChart, go to [email or web address].    Your next appointment:   6 month(s)  The format for your next appointment:   In Person  Provider:   Gwyndolyn Kaufman, MD

## 2020-07-19 ENCOUNTER — Telehealth: Payer: Self-pay | Admitting: *Deleted

## 2020-07-19 MED FILL — METOPROLOL TARTRATE 25 MG T: 25 | 30 days supply | Qty: 90 | Fill #0

## 2020-07-19 NOTE — Telephone Encounter (Signed)
Spoke with patient and let her know that her medication is ready for pick up and was called in under Dr. Burt Knack who was our DOD yesterday. This was done while Dr. Jacolyn Reedy credentialing issue is being resolved.  She thanked me for the call and update.

## 2020-07-20 ENCOUNTER — Other Ambulatory Visit: Payer: Medicaid Other | Admitting: *Deleted

## 2020-07-20 ENCOUNTER — Other Ambulatory Visit (INDEPENDENT_AMBULATORY_CARE_PROVIDER_SITE_OTHER): Payer: BC Managed Care – PPO

## 2020-07-20 ENCOUNTER — Other Ambulatory Visit: Payer: Self-pay

## 2020-07-20 DIAGNOSIS — R002 Palpitations: Secondary | ICD-10-CM

## 2020-07-20 DIAGNOSIS — E785 Hyperlipidemia, unspecified: Secondary | ICD-10-CM

## 2020-07-20 LAB — LIPID PANEL
Chol/HDL Ratio: 3.1 ratio (ref 0.0–4.4)
Cholesterol, Total: 169 mg/dL (ref 100–199)
HDL: 55 mg/dL (ref >39)
LDL Chol Calc (NIH): 104 mg/dL — ABNORMAL HIGH (ref 0–99)
Triglycerides: 52 mg/dL (ref 0–149)
VLDL Cholesterol Cal: 10 mg/dL (ref 5–40)

## 2020-07-23 MED FILL — ATORVASTATIN 40 MG TABLET: 40 | 30 days supply | Qty: 30 | Fill #3

## 2020-08-02 ENCOUNTER — Other Ambulatory Visit (HOSPITAL_COMMUNITY): Payer: Self-pay | Admitting: Physician Assistant

## 2020-08-02 MED FILL — BACLOFEN 10 MG TABS: 10 | 10 days supply | Qty: 30 | Fill #0

## 2021-01-23 ENCOUNTER — Other Ambulatory Visit (HOSPITAL_COMMUNITY): Payer: Self-pay

## 2021-01-23 MED ORDER — PHENTERMINE HCL 37.5 MG PO CAPS
ORAL_CAPSULE | Freq: Every day | ORAL | 0 refills | Status: DC
Start: 2021-01-23 — End: 2021-05-30
  Filled 2021-01-23: qty 30, 30d supply, fill #0
  Filled 2021-03-05: qty 30, 30d supply, fill #1

## 2021-03-05 ENCOUNTER — Other Ambulatory Visit (HOSPITAL_COMMUNITY): Payer: Self-pay

## 2021-03-06 ENCOUNTER — Other Ambulatory Visit (HOSPITAL_COMMUNITY): Payer: Self-pay

## 2021-05-18 IMAGING — MR MR SACRUM WITHOUT CONTRAST
4 of 5 series · 22 of 48 positions shown · non-contrast
Comparison: CT abdomen and pelvis 01/03/2019.

CLINICAL DATA: Severe low back and coccygeal pain since June 2018. No known injury.

EXAM:
MR SACRUM WITHOUT CONTRAST
TECHNIQUE: Multiplanar, multisequence MR imaging of the sacrum was performed.
No intravenous contrast was administered.

[Series 4: T2 fat-sat · sagittal · 4.0mm · 0.94mm/px · 3 of 29 slices shown]
[im 4/29]
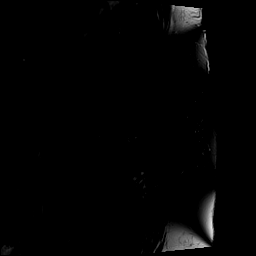
[im 16/29]
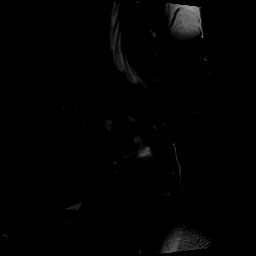
[im 25/29]
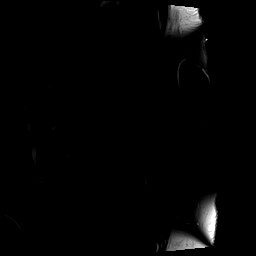

[Series 5: T1 · axial · 4.0mm · 0.59mm/px · z∈[-111,+69]mm · 9 of 40 slices shown (1 of 2)]
[im 1/40]
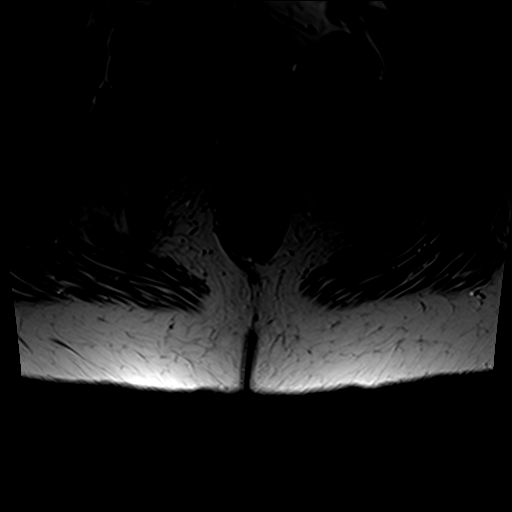
[im 7/40]
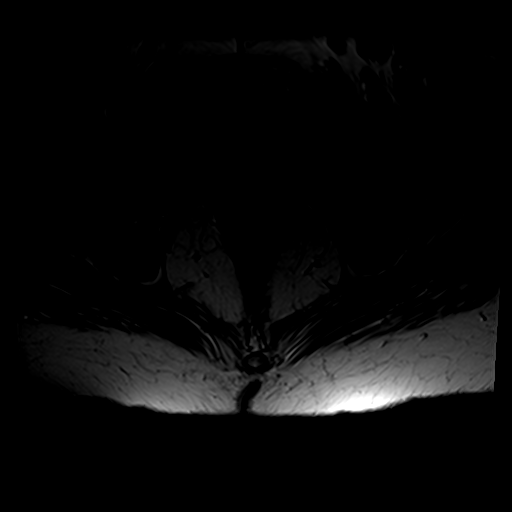
[im 14/40]
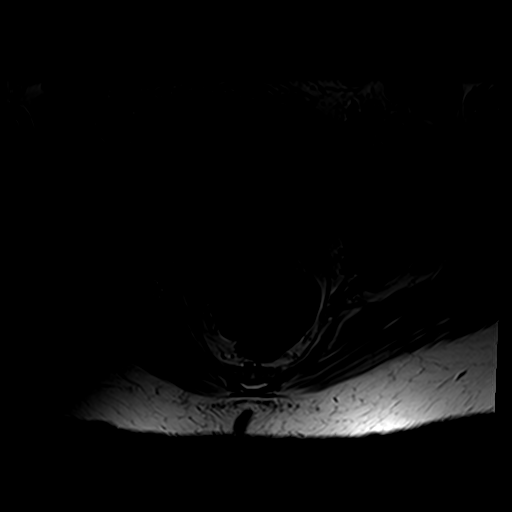
[im 17/40]
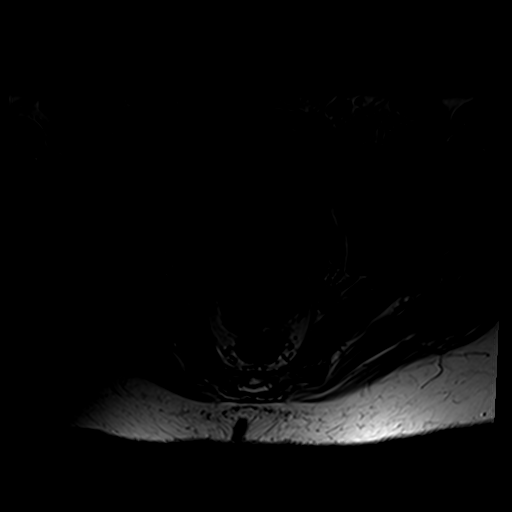
[im 20/40]
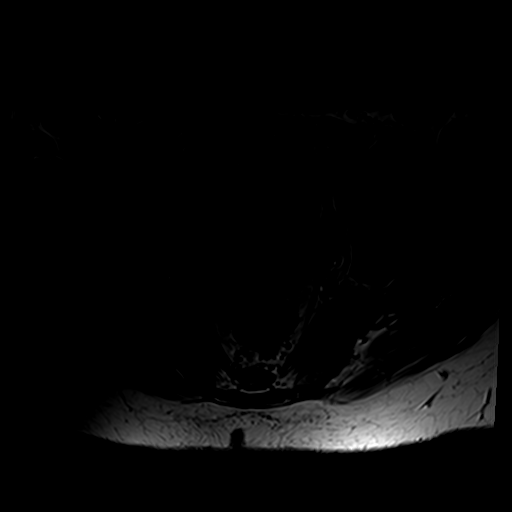
[im 23/40]
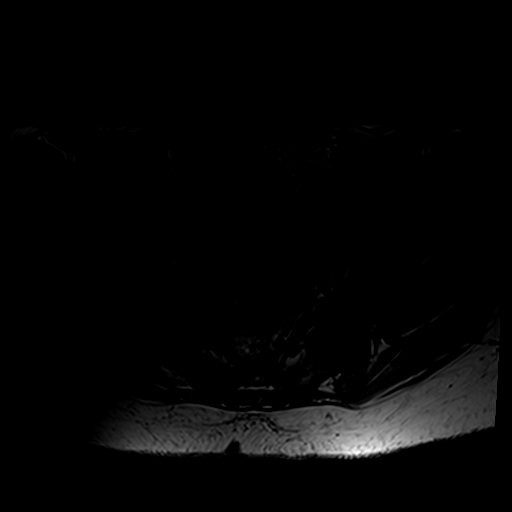
[im 27/40]
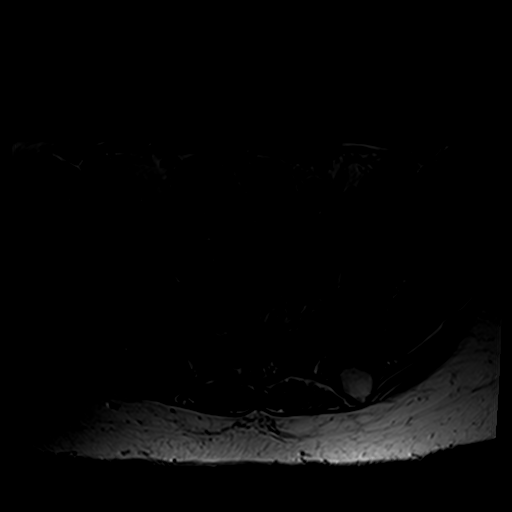
[im 33/40]
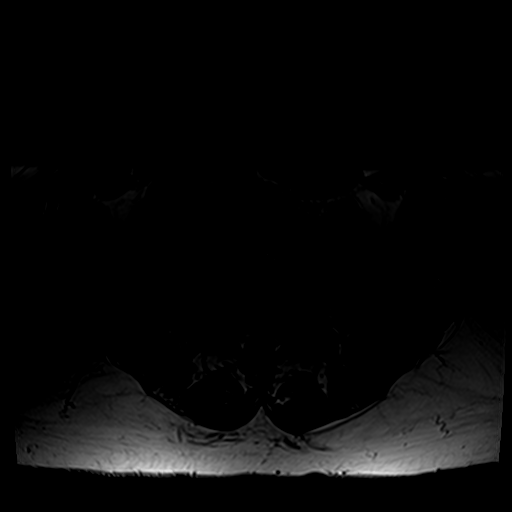
[im 40/40]
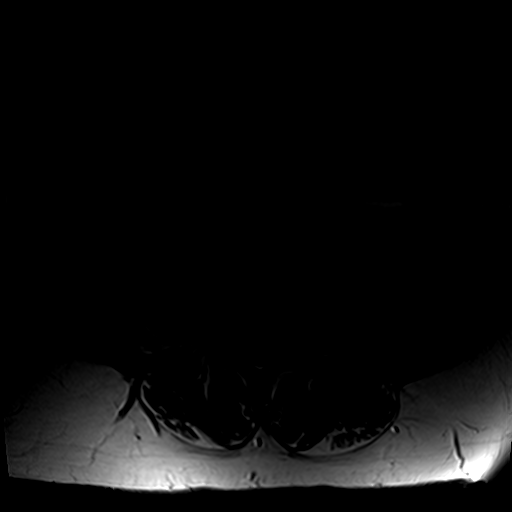

[Series 7: T1 · oblique · 3.0mm · 0.49mm/px · 6 of 20 slices shown (2 of 2)]
[im 1/20]
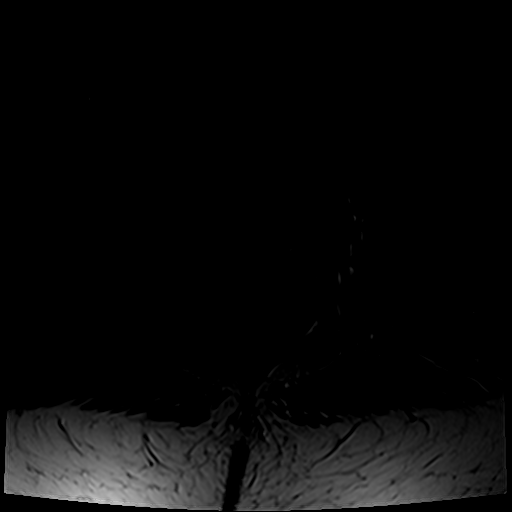
[im 4/20]
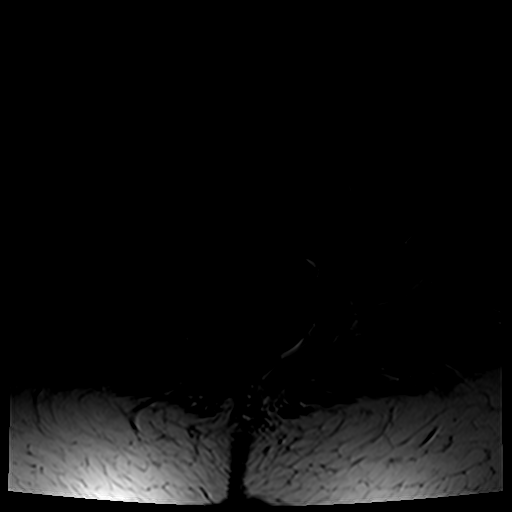
[im 8/20]
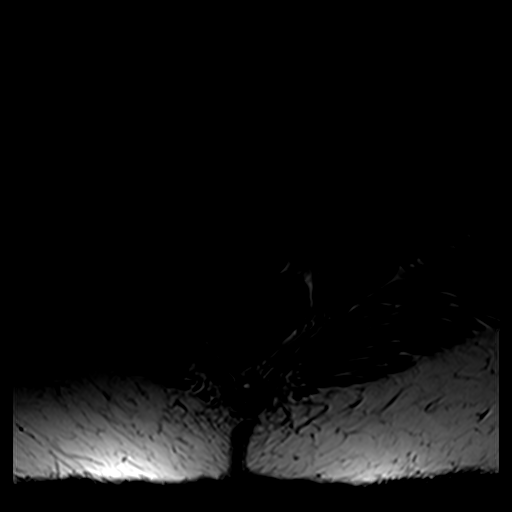
[im 12/20]
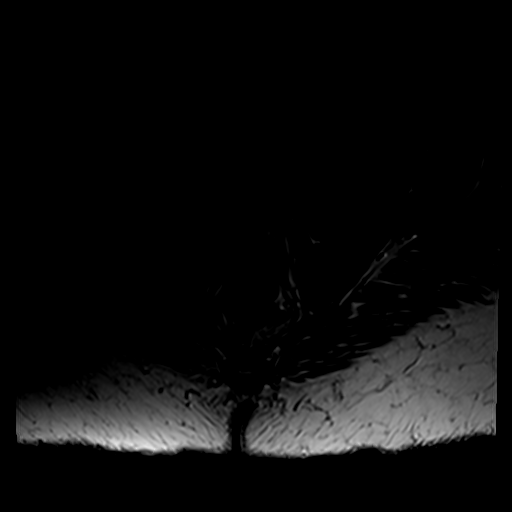
[im 16/20]
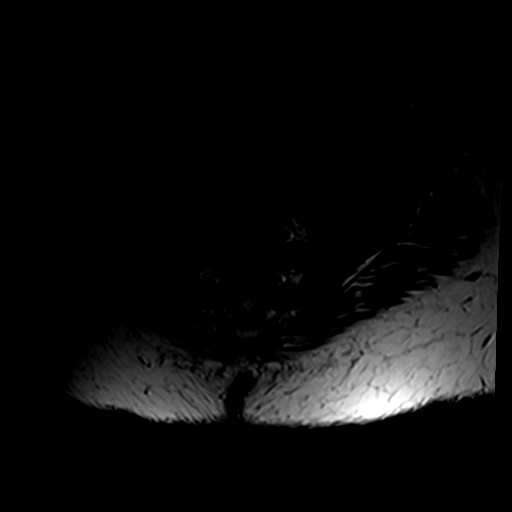
[im 20/20]
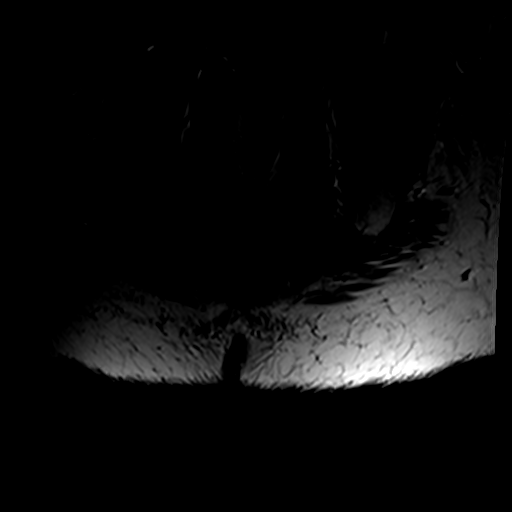

[Series 8: STIR · oblique · 3.0mm · 0.49mm/px · 4 of 20 slices shown]
[im 1/20]
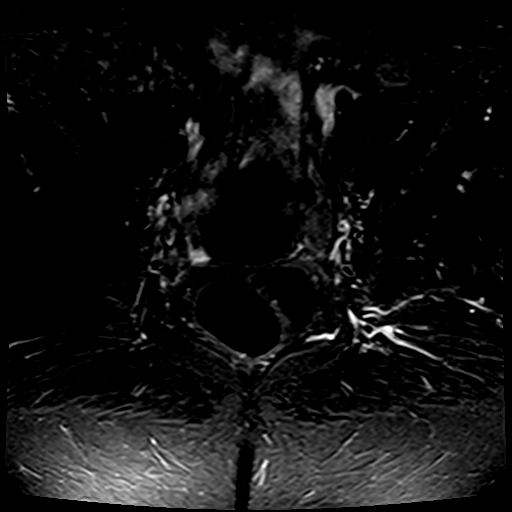
[im 4/20]
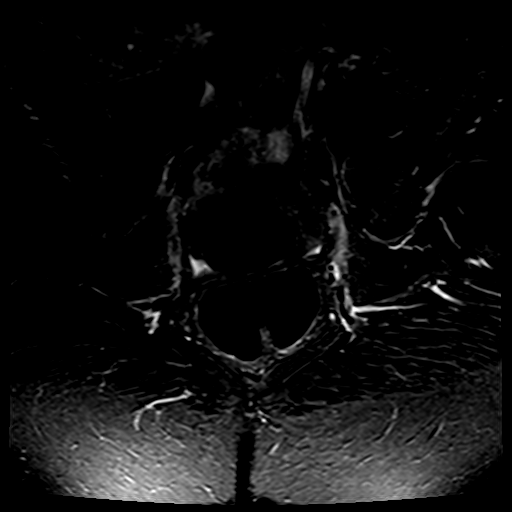
[im 12/20]
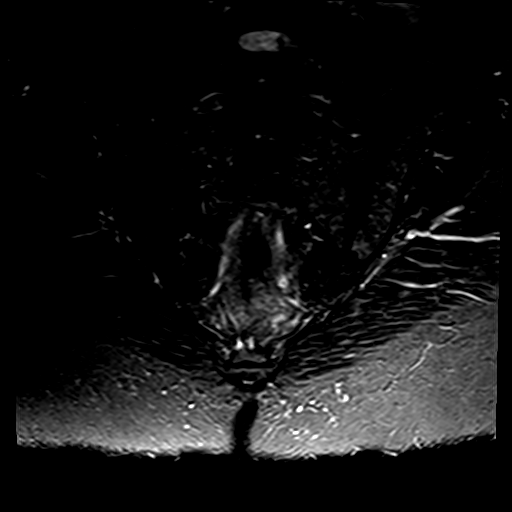
[im 20/20]
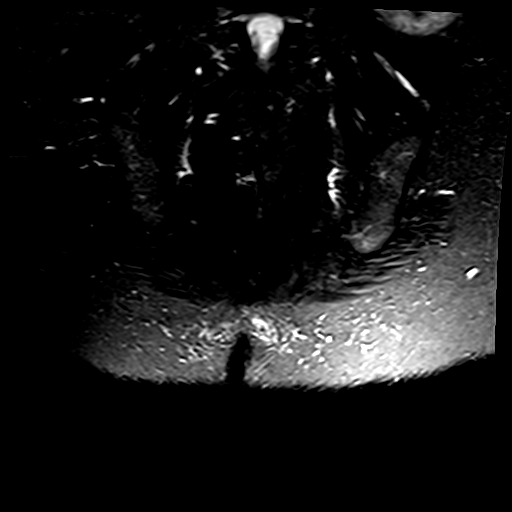

[22 of 48 positions shown; findings below may reference images not displayed]

FINDINGS: Bones/Joint/Cartilage

Marrow signal is normal throughout. In particular, the
sacrococcygeal junction and coccyx demonstrate normal signal. No
fluid or edema is present about the sacrococcygeal junction.

Ligaments

Intact.

Muscles and Tendons

Intact.  No fatty atrophy or focal lesion.

Soft tissues

Imaged intrapelvic contents appear normal.
IMPRESSION: Normal examination.  No finding to explain the patient's symptoms.

## 2021-05-29 ENCOUNTER — Other Ambulatory Visit: Payer: Self-pay | Admitting: Physician Assistant

## 2021-05-29 DIAGNOSIS — Z1231 Encounter for screening mammogram for malignant neoplasm of breast: Secondary | ICD-10-CM

## 2021-05-30 ENCOUNTER — Encounter (HOSPITAL_BASED_OUTPATIENT_CLINIC_OR_DEPARTMENT_OTHER): Payer: Self-pay | Admitting: Family

## 2021-05-30 ENCOUNTER — Telehealth: Payer: Self-pay | Admitting: Cardiology

## 2021-05-30 ENCOUNTER — Other Ambulatory Visit: Payer: Self-pay

## 2021-05-30 ENCOUNTER — Encounter: Payer: Self-pay | Admitting: *Deleted

## 2021-05-30 ENCOUNTER — Ambulatory Visit (INDEPENDENT_AMBULATORY_CARE_PROVIDER_SITE_OTHER): Payer: Medicaid Other | Admitting: Family

## 2021-05-30 VITALS — BP 126/80 | HR 64 | Ht 62.0 in | Wt 225.0 lb

## 2021-05-30 DIAGNOSIS — R002 Palpitations: Secondary | ICD-10-CM

## 2021-05-30 DIAGNOSIS — E7849 Other hyperlipidemia: Secondary | ICD-10-CM | POA: Diagnosis not present

## 2021-05-30 DIAGNOSIS — I471 Supraventricular tachycardia: Secondary | ICD-10-CM

## 2021-05-30 DIAGNOSIS — E782 Mixed hyperlipidemia: Secondary | ICD-10-CM | POA: Diagnosis not present

## 2021-05-30 MED ORDER — METOPROLOL TARTRATE 25 MG PO TABS: 25.0000 mg | ORAL_TABLET | ORAL | 0 refills | Status: DC | PRN

## 2021-05-30 MED ORDER — METOPROLOL SUCCINATE ER 25 MG PO TB24: 12.5000 mg | ORAL_TABLET | Freq: Every day | ORAL | 2 refills | Status: DC

## 2021-05-30 NOTE — Patient Instructions (Addendum)
Medication Instructions:  Your physician has recommended you make the following change in your medication:   START Metoprolol Succinate half tablet (12.'5mg'$ ) daily to help prevent SVT   START Metoprolol Tartrate one '25mg'$  tablet as needed for breakthrough palpitations  *If you need a refill on your cardiac medications before your next appointment, please call your pharmacy*   Lab Work: Your physician recommends that you return for lab work this week or next week for TSH, CMP, CBC. You do not need to be fasting.   If you have labs (blood work) drawn today and your tests are completely normal, you will receive your results only by: [place] (if you have MyChart) OR A paper copy in the mail If you have any lab test that is abnormal or we need to change your treatment, we will call you to review the results.   Testing/Procedures: Your EKG today showed normal sinus rhythm.   Follow-Up: At [facility], you and your health needs are our priority.  As part of our continuing mission to provide you with exceptional heart care, we have created designated Provider Care Teams.  These Care Teams include your primary Cardiologist (physician) and Advanced Practice Providers (APPs -  Physician Assistants and Nurse Practitioners) who all work together to provide you with the care you need, when you need it.  We recommend signing up for the patient portal called "MyChart".  Sign up information is provided on this After Visit Summary.  MyChart is used to connect with patients for Virtual Visits (Telemedicine).  Patients are able to view lab/test results, encounter notes, upcoming appointments, etc.  Non-urgent messages can be sent to your provider as well.   To learn more about what you can do with MyChart, go to [email or web address].    Your next appointment:   2-3 month(s)  The format for your next appointment:   In Person  Provider:   You may see Freada Bergeron, MD or one of the  following Advanced Practice Providers on your designated Care Team:   Richardson Dopp, PA-C Vin Tingley, [place]   Other Instructions  Supraventricular Tachycardia, Adult Supraventricular tachycardia (SVT) is a kind of abnormal heartbeat. It makes your heart beat very fast. This may last for a short time and then return tonormal, or it may last longer. A normal resting heartbeat is 60-100 times a minute. This condition can make your heart beat more than 150 times a minute. Times of having a fast heartbeat (episodes) can be scary, but they are usually not dangerous. In some cases, they may lead to heart failure if they: Happen many times a day. Last longer than a few seconds. What are the causes?  This condition happens when electrical signals are sent out from areas of theheart that do not normally send signals for the heartbeat. What increases the risk? You are more likely to develop this condition if you are: Middle aged or younger. Female. The following factors may also make you more likely to develop this condition: Stress. Feeling worried or nervous (anxiety). Tiredness. Smoking. Stimulant drugs, such as cocaine and methamphetamine. Alcohol. Caffeine. Pregnancy. Having certain medical conditions. What are the signs or symptoms? A pounding heart. A feeling that your heart is skipping beats (palpitations). Weakness. Trouble getting enough air. Pain or tightness in your chest. Dizziness or feeling like you are going to pass out (faint). Feeling worried or nervous. Sweating. Feeling like you may vomit (nausea). Passing out. Tiredness. Sometimes, there are no symptoms. How is  this treated? Treatment may include: Vagal nerve stimulation. Ways to do this include: Holding your breath and pushing, as though you are pooping (having a bowel movement). Massaging an area on one side of your neck. Do not try this yourself. Only a doctor should do this. If done the wrong way, it can lead  to a stroke. Bending forward with your head between your legs. Coughing while bending forward with your head between your legs. Putting an ice-cold, wet towel on your face. Medicines that prevent attacks. Medicine to stop an attack given through an IV tube at the hospital. A small electric shock (cardioversion) that stops an attack. A procedure to get rid of cells in the area that is causing the fast heartbeats (radiofrequency ablation). If you do not have symptoms, you may not need treatment. Follow these instructions at home: Stress Avoid things that make you feel stressed. To deal with stress, try: Doing yoga or meditation. Being out in nature. Listening to relaxing music. Doing deep breathing. Taking steps to be healthy, such as getting lots of sleep, exercising, and eating a balanced diet. Talking with a mental health doctor. Lifestyle  Try to get at least 7 hours of sleep each night. Do not smoke or use any products that contain nicotine or tobacco. If you need help quitting, ask your doctor. Do not drink alcohol if it gives you a fast heartbeat. If alcohol does not seem to give you a fast heartbeat, limit your alcohol use. If you drink alcohol: Limit how much you have to: 0-1 drink a day for women who are not pregnant. 0-2 drinks a day for men. Know how much alcohol is in your drink. In the [place] one drink equals one 12 oz bottle of beer (355 mL), one 5 oz glass of wine (148 mL), or one 1 oz glass of hard liquor (44 mL). Be aware of how caffeine affects you. If caffeine gives you a fast heartbeat, do not eat, drink, or use anything with caffeine in it. If caffeine does not seem to give you a fast heartbeat, limit how much caffeine you eat, drink, or use. Do not use stimulant drugs. If you need help quitting, ask your doctor.  General instructions Stay at a healthy weight. Exercise regularly. Ask your doctor about good activities for you. Try one or a mixture of these: 150  minutes a week of gentle exercise, like walking or yoga. 75 minutes a week of exercise that is very active, like running or swimming. Do vagus nerve treatments to slow down your heartbeat as told by your doctor. Take over-the-counter and prescription medicines only as told by your doctor. Keep all follow-up visits. Contact a doctor if: You have a fast heartbeat more often. Times of having a fast heartbeat last longer than before. Home treatments to slow down your heartbeat do not help. You have new symptoms. Get help right away if: You have chest pain. Your symptoms get worse. You have trouble breathing. Your heart beats very fast for more than 20 minutes. You pass out. These symptoms may be an emergency. Get medical help right away. Call your local emergency services (911 in the [place] Do not wait to see if the symptoms will go away. Do not drive yourself to the hospital. Summary SVT is a type of abnormal heartbeat. This condition can make your heart beat more than 150 times a minute. If you do not have symptoms, you may not need treatment. This information is not intended to  replace advice given to you by your health care provider. Make sure you discuss any questions you have with your healthcare provider. Document Revised: 05/05/2020 Document Reviewed: 05/05/2020 Elsevier Patient Education  [employer].

## 2021-05-30 NOTE — Progress Notes (Signed)
Office Visit    Patient Name: Tanya [name] Date of Encounter: 05/30/2021  PCP:  Willeen Niece, [place]  Cardiologist:  Freada Bergeron, MD  Advanced Practice Provider:  No care team member to display Electrophysiologist:  None      Chief Complaint    Tanya [name] is a 40 y.o. female with a hx of anxiety, depression, HTN, HLD, SVT, obesity presents today for SVT   Past Medical History    Past Medical History:  Diagnosis Date   Anemia    Anxiety    Arthritis    L knee OA   Depression    history of depression in 01/02/11 with death of mother   Hyperlipidemia    Hypertension    Migraine    Obesity 02/2017   has had gastric sleeve   Past Surgical History:  Procedure Laterality Date   ABDOMINAL HYSTERECTOMY     ANAL RECTAL MANOMETRY N/A 01/11/2018   Procedure: ANO RECTAL MANOMETRY;  Surgeon: Mauri Pole, MD;  Location: [facility];  Service: Endoscopy;  Laterality: N/A;   CARPAL TUNNEL RELEASE Bilateral    CESAREAN SECTION     x 1   CHOLECYSTECTOMY     KNEE SURGERY Left    LAPAROSCOPIC GASTRIC SLEEVE RESECTION N/A 02/17/2017   Procedure: LAPAROSCOPIC GASTRIC SLEEVE RESECTION, UPPER ENDO;  Surgeon: Johnathan Hausen, MD;  Location: [facility];  Service: General;  Laterality: N/A;   LAPAROSCOPIC UNILATERAL SALPINGECTOMY Left 06/10/2014   Procedure: LAPAROSCOPIC Left SALPINGECTOMY, removal of ectopic pregnancy;  Surgeon: Guss Bunde, MD;  Location: [facility];  Service: Gynecology;  Laterality: Left;   TUBAL LIGATION     VAGINAL HYSTERECTOMY Bilateral 03/25/2018   Procedure: HYSTERECTOMY VAGINAL, RIGHT SALPINGECTOMY;  Surgeon: Eldred Manges, MD;  Location: [facility];  Service: Gynecology;  Laterality: Bilateral;  ERAS   WISDOM TOOTH EXTRACTION      Allergies  Allergies  Allergen Reactions   Imitrex [Sumatriptan] Shortness Of Breath   Zolmitriptan Shortness Of Breath and Swelling   Elemental Sulfur  Rash    Pt states that her mother told her that she was allergic   Latex Hives and Swelling   Sulfa Antibiotics Other (See Comments) and Rash    Unknown. Since childhood.  DUE TO SURGERY    Aspirin Other (See Comments)    "cant take because I had bariatric surgery"   Ibuprofen Other (See Comments)    Can not take because of surgery (No NSAIDS)    History of Present Illness    Tanya [name] is a 40 y.o. female with a hx of anxiety, depression, HTN, HLD, SVT, obesity last seen 07/18/20  Diagnosed with SVT in 01/01/17. She stayed a few nights in the hospital and was discharged without medications. She was seen 07/2020 by Dr. Johney Frame with recurrent SVT. Previous sleep study with no evidence of OSA. She was recommended for 7 day ZIO and Metoprolol tartrate '25mg'$  BID started.  Presents today for follow up. She has noted increasing episodes of palpitations over the last few weeks. Notes not taking her Metoprolol, never started and just recently resumed her Atorvastatin. Does not drink alcohol. Drinks one cup of coffee in the morning. No pro-arrhythmic agents. 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 Maintains a busy schedule which can be stressful. Reports no shortness of breath nor dyspnea on exertion. Reports no chest pain, pressure, or tightness. No edema, orthopnea, PND. Reports frustration with  weight gain. No formal exercise routine.   EKGs/Labs/Other Studies Reviewed:   The following studies were reviewed today: TTE 2017/01/15: Study Conclusions   - Left ventricle: The cavity size was normal. Wall thickness was    increased in a pattern of mild LVH. Systolic function was normal.    The estimated ejection fraction was in the range of 60% to 65%.    Wall motion was normal; there were no regional wall motion    abnormalities. Doppler parameters are consistent with high    ventricular filling pressure.   Impressions:   - Normal LV systolic function; mild LVH; grade 1 diastolic    function.     Sleep study Jan 15, 2017: RECOMMENDATIONS:   1. This study does not demonstrate any significant obstructive or  central sleep disordered breathing. There was evidence of mild,  REM sleep related OSA, for which CPAP therapy is not warranted;  mild to moderate snoring was noted. Avoidance of the supine sleep  position and weight loss will likely help the snoring and mild  REM related OSA.  2. This study shows sleep fragmentation and abnormal sleep stage  percentages; these are nonspecific findings and per se do not  signify an intrinsic sleep disorder or a cause for the patient's  sleep-related symptoms. Causes include (but are not limited to)  the first night effect of the sleep study, circadian rhythm  disturbances, medication effect or an underlying mood disorder or  medical problem.  3. The patient should be cautioned not to drive, work at heights,  or operate dangerous or heavy equipment when tired or sleepy.  Review and reiteration of good sleep hygiene measures should be  pursued with any patient.  4. The patient will be offered a follow-up appointment at [facility] for  discussion of the test results and further management strategies.  The referring provider will be notified of the test results.     CT abdomen/pelvis 01-16-2019: FINDINGS: Lower chest: Lung bases are normal.   Hepatobiliary: Previous cholecystectomy. Liver and biliary tree are normal.   Pancreas: Normal.   Spleen: Normal.   Adrenals/Urinary Tract: Adrenal glands are normal. Kidneys are normal in size without hydronephrosis or nephrolithiasis. Ureters and bladder are normal.   Stomach/Bowel: Evidence of previous gastric bypass surgery. Small bowel is normal. Appendix is normal. Colon is normal.   Vascular/Lymphatic: Minimal calcified plaque over the infrarenal abdominal aorta. No adenopathy.   Reproductive: Previous hysterectomy.   Other: Tiny amount of free fluid in the pelvis. No focal inflammatory change. No  free peritoneal air.   Musculoskeletal: No acute findings.   IMPRESSION: No acute findings in the abdomen/pelvis  EKG:  EKG is ordered today.  The ekg ordered today demonstrates NSR 64 bpm with no acute ST/T wave changes.  Recent Labs: No results found for requested labs within last 8760 hours.  Recent Lipid Panel    Component Value Date/Time   CHOL 169 07/20/2020 1011   TRIG 52 07/20/2020 1011   HDL 55 07/20/2020 1011   CHOLHDL 3.1 07/20/2020 1011   CHOLHDL 7.1 10/05/2007 0545   VLDL 31 10/05/2007 0545   LDLCALC 104 (H) 07/20/2020 1011   Home Medications   Current Meds  Medication Sig   atorvastatin (LIPITOR) 40 MG tablet Take 40 mg by mouth daily.   metoprolol succinate (TOPROL XL) 25 MG 24 hr tablet Take 0.5 tablets (12.5 mg total) by mouth daily.   metoprolol tartrate (LOPRESSOR) 25 MG tablet Take 1 tablet (25 mg total) by mouth  as needed (take as needed for breakthrough palpitations).   topiramate (TOPAMAX) 100 MG tablet Take 2.5 tablets (250 mg total) by mouth 2 (two) times daily.   Current Facility-Administered Medications for the 05/30/21 encounter (Office Visit) with Loel Dubonnet, NP  Medication   cyanocobalamin ((VITAMIN B-12)) injection 1,000 mcg     Review of Systems      All other systems reviewed and are otherwise negative except as noted above.  Physical Exam    VS:  BP 126/80   Pulse 64   Ht '5\' 2"'$  (1.575 m)   Wt 225 lb (102.1 kg)   LMP 03/12/2018 (Exact Date)   SpO2 99%   BMI 41.15 kg/m  , BMI Body mass index is 41.15 kg/m.  Wt Readings from Last 3 Encounters:  05/30/21 225 lb (102.1 kg)  07/18/20 220 lb 12.8 oz (100.2 kg)  04/26/20 199 lb (90.3 kg)     GEN: Well nourished, overweight, well developed, in no acute distress. HEENT: normal. Neck: Supple, no JVD, carotid bruits, or masses. Cardiac: RRR, no murmurs, rubs, or gallops. No clubbing, cyanosis, edema.  Radials/PT 2+ and equal bilaterally.  Respiratory:  Respirations regular and  unlabored, clear to auscultation bilaterally. GI: Soft, nontender, nondistended. MS: No deformity or atrophy. Skin: Warm and dry, no rash. Neuro:  Strength and sensation are intact. Psych: Normal affect.  Assessment & Plan    SVT - Known history of SVT. Reports increase over last 2 weeks. NOt presently taking BB therapy. Start Toprol 12.'5mg'$  QD. Rx PRN Lopressor '25mg'$  for breakthrough tachycardia. Educated on vagal maneuvers. Lab ordered including TSH, BMP, CBC to rule out causative factor. If SVT not controlled at follow up, consider referral to EP and consideration of ablation. Discussed triggers including stress, dehydration, alcohol, caffeine. Does not take any proarrhythmic agents.   Obesity - Weight loss via diet and exercise encouraged. Discussed the impact being overweight would have on cardiovascular risk.   HLD - Likely familial hyperlipidemia with most recent LDL >200 by PCP. Recommend adherence to Atorvastatin '40mg'$  QD which she recently resumed. Would recommend repeat lipid panel and consideration of lipoprotein a at follow up. If LDL not at goal of <70 would benefit from escalation of Atorvastatin vs [id number]. Lipid lowerin gdiet encouraged.   Disposition: Follow up in 2 month(s) with Dr. Johney Frame or APP.  Signed, Loel Dubonnet, NP 05/30/2021, 8:16 PM [place]

## 2021-05-30 NOTE — Telephone Encounter (Signed)
Pt is calling in today to make an appt with Dr Johney Frame or an APP, for complaints of SVT. Pt states she's been going in and out of SVT for the last week, but starting yesterday, she states she has consistently been in this, with a heart rate of 154 bpm.  Pt states when she is at rest her rate will stay in the 120s but when walking, gets up to 150s. Pt states she feels sob and very fatigued when her rate gets in the 150s. Pt states she is limiting her caffeine intake, she hasn't changed her diet, and she is taking all her meds as prescribed. She confirmed with me that she is taking her beta blocker as prescribed. Pt states she has palpitations with this. She states she is tracking her rates via her apple watch, and can bring those recordings with her to a visit if needed.  Scheduled the pt to see [name] Montana NP today 8/25 at 3:30 pm at our [facility] location.  She is aware to arrive 15-20 mins prior to this appt.  Location to [facility] provided to the pt via mychart message as she requested.  ED precautions provided to the pt if symptoms were to worsen in the meantime between now and her appt.  Pt verbalized understanding and agrees with this plan. Will send this message to Dr. Jacolyn Reedy in-basket for covering Cardiologist to review, and will also send this message to [name] Montana NP as an Juluis Rainier about add on to her schedule for today.

## 2021-05-30 NOTE — Telephone Encounter (Signed)
  Per schedule request MyChart message:  STAT if HR is under 50 or over 120 (normal HR is 60-100 beats per minute)  What is your heart rate? Resting it's been about 115 and up walking it's got up to 154  Do you have a log of your heart rate readings (document readings)? Yes I have record on my Apple Watch  Do you have any other symptoms? I just feel bad, no energy, headache , feel out of breath when it pumps fast and it feels like it's beating out my chest at times

## 2021-06-01 ENCOUNTER — Ambulatory Visit
Admission: RE | Admit: 2021-06-01 | Discharge: 2021-06-01 | Disposition: A | Discharge status: Home / Self Care | Payer: Medicaid Other | Source: Ambulatory Visit

## 2021-06-01 ENCOUNTER — Other Ambulatory Visit: Payer: Self-pay

## 2021-06-01 DIAGNOSIS — Z1231 Encounter for screening mammogram for malignant neoplasm of breast: Secondary | ICD-10-CM

## 2021-06-08 LAB — CBC
Hematocrit: 41.3 % (ref 34.0–46.6)
Hemoglobin: 14 g/dL (ref 11.1–15.9)
MCH: 28.2 pg (ref 26.6–33.0)
MCHC: 33.9 g/dL (ref 31.5–35.7)
MCV: 83 fL (ref 79–97)
Platelets: 372 10*3/uL (ref 150–450)
RBC: 4.97 x10E6/uL (ref 3.77–5.28)
RDW: 13.1 % (ref 11.7–15.4)
WBC: 10.2 10*3/uL (ref 3.4–10.8)

## 2021-06-08 LAB — BASIC METABOLIC PANEL
BUN/Creatinine Ratio: 21 (ref 9–23)
BUN: 17 mg/dL (ref 6–24)
CO2: 20 mmol/L (ref 20–29)
Calcium: 9.3 mg/dL (ref 8.7–10.2)
Chloride: 104 mmol/L (ref 96–106)
Creatinine, Ser: 0.8 mg/dL (ref 0.57–1.00)
Glucose: 78 mg/dL (ref 65–99)
Potassium: 3.9 mmol/L (ref 3.5–5.2)
Sodium: 140 mmol/L (ref 134–144)
eGFR: 94 mL/min/{1.73_m2} (ref >59)

## 2021-06-08 LAB — TSH: TSH: 2.69 u[IU]/mL (ref 0.450–4.500)

## 2021-06-19 ENCOUNTER — Other Ambulatory Visit: Payer: Self-pay

## 2021-08-14 ENCOUNTER — Other Ambulatory Visit (HOSPITAL_COMMUNITY): Payer: Self-pay

## 2021-09-24 ENCOUNTER — Ambulatory Visit (HOSPITAL_BASED_OUTPATIENT_CLINIC_OR_DEPARTMENT_OTHER): Payer: Medicaid Other | Admitting: Family

## 2021-12-05 ENCOUNTER — Other Ambulatory Visit (HOSPITAL_COMMUNITY): Payer: Self-pay

## 2021-12-05 MED ORDER — ERGOCALCIFEROL 1.25 MG (50000 UT) PO CAPS
1.0000 | ORAL_CAPSULE | ORAL | 5 refills | Status: DC
  Filled 2021-12-05 – 2021-12-06 (×3): qty 4, 28d supply, fill #0

## 2021-12-05 MED ORDER — ATORVASTATIN CALCIUM 10 MG PO TABS
10.0000 mg | ORAL_TABLET | ORAL | 5 refills | Status: DC
  Filled 2021-12-05 – 2021-12-06 (×3): qty 30, 30d supply, fill #0

## 2021-12-05 MED ORDER — WEGOVY 0.25 MG/0.5ML ~~LOC~~ SOAJ
SUBCUTANEOUS | 0 refills | Status: DC
  Filled 2021-12-06: qty 2, 28d supply, fill #0
  Filled 2021-12-06 (×2): qty 4, 28d supply, fill #0

## 2021-12-06 ENCOUNTER — Other Ambulatory Visit (HOSPITAL_COMMUNITY): Payer: Self-pay

## 2021-12-10 ENCOUNTER — Other Ambulatory Visit (HOSPITAL_COMMUNITY): Payer: Self-pay

## 2021-12-11 ENCOUNTER — Other Ambulatory Visit (HOSPITAL_COMMUNITY): Payer: Self-pay

## 2021-12-11 MED ORDER — SAXENDA 18 MG/3ML ~~LOC~~ SOPN
PEN_INJECTOR | SUBCUTANEOUS | 0 refills | Status: DC
  Filled 2021-12-11: qty 9, 30d supply, fill #0

## 2021-12-12 ENCOUNTER — Other Ambulatory Visit (HOSPITAL_COMMUNITY): Payer: Self-pay

## 2021-12-20 ENCOUNTER — Other Ambulatory Visit (HOSPITAL_COMMUNITY): Payer: Self-pay

## 2021-12-28 ENCOUNTER — Other Ambulatory Visit (HOSPITAL_COMMUNITY): Payer: Self-pay

## 2021-12-29 ENCOUNTER — Other Ambulatory Visit (HOSPITAL_COMMUNITY): Payer: Self-pay

## 2022-04-14 ENCOUNTER — Other Ambulatory Visit: Payer: Self-pay | Admitting: Surgery

## 2022-04-14 DIAGNOSIS — Z9884 Bariatric surgery status: Secondary | ICD-10-CM

## 2022-04-18 ENCOUNTER — Ambulatory Visit
Admission: RE | Admit: 2022-04-18 | Discharge: 2022-04-18 | Disposition: A | Discharge status: Home / Self Care | Payer: Medicaid Other | Source: Ambulatory Visit | Attending: Surgery | Admitting: Surgery

## 2022-04-18 DIAGNOSIS — Z9884 Bariatric surgery status: Secondary | ICD-10-CM

## 2022-05-26 ENCOUNTER — Other Ambulatory Visit: Payer: Self-pay | Admitting: Physician Assistant

## 2022-05-26 DIAGNOSIS — Z1231 Encounter for screening mammogram for malignant neoplasm of breast: Secondary | ICD-10-CM

## 2022-06-06 DIAGNOSIS — Z1231 Encounter for screening mammogram for malignant neoplasm of breast: Secondary | ICD-10-CM

## 2022-06-12 ENCOUNTER — Ambulatory Visit
Admission: RE | Admit: 2022-06-12 | Discharge: 2022-06-12 | Disposition: A | Discharge status: Home / Self Care | Payer: Medicaid Other | Source: Ambulatory Visit | Attending: Physician Assistant | Admitting: Physician Assistant

## 2022-06-12 DIAGNOSIS — Z1231 Encounter for screening mammogram for malignant neoplasm of breast: Secondary | ICD-10-CM

## 2022-06-17 ENCOUNTER — Other Ambulatory Visit: Payer: Self-pay | Admitting: Physician Assistant

## 2022-06-17 DIAGNOSIS — R928 Other abnormal and inconclusive findings on diagnostic imaging of breast: Secondary | ICD-10-CM

## 2022-07-10 ENCOUNTER — Emergency Department (HOSPITAL_COMMUNITY)
Admission: EM | Admit: 2022-07-10 | Discharge: 2022-07-11 | Disposition: A | Discharge status: Home / Self Care | Payer: Medicaid Other | Attending: Emergency Medicine | Admitting: Emergency Medicine

## 2022-07-10 ENCOUNTER — Emergency Department (HOSPITAL_COMMUNITY): Payer: Medicaid Other

## 2022-07-10 ENCOUNTER — Encounter (HOSPITAL_COMMUNITY): Payer: Self-pay | Admitting: Emergency Medicine

## 2022-07-10 DIAGNOSIS — Z794 Long term (current) use of insulin: Secondary | ICD-10-CM | POA: Insufficient documentation

## 2022-07-10 DIAGNOSIS — Z8616 Personal history of COVID-19: Secondary | ICD-10-CM | POA: Diagnosis not present

## 2022-07-10 DIAGNOSIS — Z91040 Latex allergy status: Secondary | ICD-10-CM | POA: Insufficient documentation

## 2022-07-10 DIAGNOSIS — R0602 Shortness of breath: Secondary | ICD-10-CM | POA: Diagnosis not present

## 2022-07-10 DIAGNOSIS — Z79899 Other long term (current) drug therapy: Secondary | ICD-10-CM | POA: Diagnosis not present

## 2022-07-10 DIAGNOSIS — R0789 Other chest pain: Secondary | ICD-10-CM | POA: Diagnosis not present

## 2022-07-10 DIAGNOSIS — R109 Unspecified abdominal pain: Secondary | ICD-10-CM | POA: Diagnosis not present

## 2022-07-10 DIAGNOSIS — R030 Elevated blood-pressure reading, without diagnosis of hypertension: Secondary | ICD-10-CM | POA: Insufficient documentation

## 2022-07-10 DIAGNOSIS — I10 Essential (primary) hypertension: Secondary | ICD-10-CM | POA: Insufficient documentation

## 2022-07-10 DIAGNOSIS — R079 Chest pain, unspecified: Secondary | ICD-10-CM | POA: Diagnosis present

## 2022-07-10 LAB — COMPREHENSIVE METABOLIC PANEL
ALT: 29 U/L (ref 0–44)
AST: 25 U/L (ref 15–41)
Albumin: 3.8 g/dL (ref 3.5–5.0)
Alkaline Phosphatase: 35 U/L — ABNORMAL LOW (ref 38–126)
Anion gap: 7 (ref 5–15)
BUN: 9 mg/dL (ref 6–20)
CO2: 23 mmol/L (ref 22–32)
Calcium: 8.4 mg/dL — ABNORMAL LOW (ref 8.9–10.3)
Chloride: 109 mmol/L (ref 98–111)
Creatinine, Ser: 0.52 mg/dL (ref 0.44–1.00)
GFR, Estimated: >60 mL/min (ref >60)
Glucose, Bld: 92 mg/dL (ref 70–99)
Potassium: 3.8 mmol/L (ref 3.5–5.1)
Sodium: 139 mmol/L (ref 135–145)
Total Bilirubin: 0.3 mg/dL (ref 0.3–1.2)
Total Protein: 6.6 g/dL (ref 6.5–8.1)

## 2022-07-10 LAB — TROPONIN I (HIGH SENSITIVITY): Troponin I (High Sensitivity): 3 ng/L (ref <18)

## 2022-07-10 LAB — CBC
HCT: 43.1 % (ref 36.0–46.0)
Hemoglobin: 14.1 g/dL (ref 12.0–15.0)
MCH: 27.4 pg (ref 26.0–34.0)
MCHC: 32.7 g/dL (ref 30.0–36.0)
MCV: 83.9 fL (ref 80.0–100.0)
Platelets: 368 10*3/uL (ref 150–400)
RBC: 5.14 MIL/uL — ABNORMAL HIGH (ref 3.87–5.11)
RDW: 12.5 % (ref 11.5–15.5)
WBC: 7.3 10*3/uL (ref 4.0–10.5)
nRBC: 0 % (ref 0.0–0.2)

## 2022-07-10 LAB — I-STAT BETA HCG BLOOD, ED (MC, WL, AP ONLY): I-stat hCG, quantitative: <5 m[IU]/mL (ref <5)

## 2022-07-10 LAB — LIPASE, BLOOD: Lipase: 30 U/L (ref 11–51)

## 2022-07-10 MED ORDER — ONDANSETRON HCL 4 MG/2ML IJ SOLN
4.0000 mg | Freq: Once | INTRAMUSCULAR | Status: AC
  Administered 2022-07-11: 4 mg via INTRAVENOUS
  Filled 2022-07-10: qty 2

## 2022-07-10 MED ORDER — MORPHINE SULFATE (PF) 4 MG/ML IV SOLN
4.0000 mg | Freq: Once | INTRAVENOUS | Status: DC
  Filled 2022-07-10: qty 1

## 2022-07-10 MED ORDER — IOHEXOL 350 MG/ML SOLN
100.0000 mL | Freq: Once | INTRAVENOUS | Status: AC | PRN
  Administered 2022-07-10: 100 mL via INTRAVENOUS

## 2022-07-10 MED ORDER — LACTATED RINGERS IV BOLUS
1000.0000 mL | Freq: Once | INTRAVENOUS | Status: AC
  Administered 2022-07-11: 1000 mL via INTRAVENOUS

## 2022-07-10 MED ORDER — LIDOCAINE VISCOUS HCL 2 % MT SOLN
15.0000 mL | Freq: Once | OROMUCOSAL | Status: DC
  Filled 2022-07-10: qty 15

## 2022-07-10 MED ORDER — ALUM & MAG HYDROXIDE-SIMETH 200-200-20 MG/5ML PO SUSP
30.0000 mL | Freq: Once | ORAL | Status: AC
  Administered 2022-07-11: 30 mL via ORAL
  Filled 2022-07-10: qty 30

## 2022-07-10 NOTE — ED Notes (Signed)
Patient transported to CT 

## 2022-07-10 NOTE — ED Triage Notes (Signed)
Patient c/o chest pressure x3 days. States PCP sent for further eval after EKG in office.

## 2022-07-10 NOTE — ED Provider Triage Note (Signed)
Emergency Medicine Provider Triage Evaluation Note  Tanya [name] , a 41 y.o. female  was evaluated in triage.  Patient presenting from PCP due to "tombstones on her EKG"  Patient reports that on Sunday she became very nauseous and started to have diarrhea.  Never had emesis.  On Monday she started to feel some discomfort in her chest that she describes as pressure.  Said that it felt as though it also went through into her back and is worse when she is laying down.  Says that it comes and goes.  History of cholecystectomy  Review of Systems  Positive: Chest discomfort, nausea Negative: Shortness of breath, palpitations  Physical Exam  BP (!) 149/95 (BP Location: Left Arm)   Pulse 72   Temp 97.8 F (36.6 C) (Oral)   Resp 15   LMP 03/12/2018 (Exact Date)   SpO2 100%  Gen:   Awake, no distress   Resp:  Normal effort  MSK:   Moves extremities without difficulty  Other:  Abdomen soft and nontender to palpation.  RRR, appears in sinus on the monitor.  Not diaphoretic.  Medical Decision Making  Medically screening exam initiated at 3:52 PM.  Appropriate orders placed.  Tanya [name] was informed that the remainder of the evaluation will be completed by another provider, this initial triage assessment does not replace that evaluation, and the importance of remaining in the ED until their evaluation is complete.  Low suspicion dissection.  Will order both chest pain labs as well is abdominal labs due to patient's nausea and diarrhea.  Also will order COVID swab.   Rhae Hammock, PA-C 07/10/22 1553

## 2022-07-10 NOTE — ED Provider Notes (Signed)
[facility] DEPT Provider Note   CSN: [id number] Arrival date & time: 07/10/22  1535     History  Chief Complaint  Patient presents with   Chest Pain    Tanya [name] is a 41 y.o. female.  41 year old female with past medical history significant for gastric sleeve, and SVT presents today for evaluation of exertional chest pain, shortness of breath since Monday.  She describes her chest pain as pressure that radiates to her left shoulder blade.  Pain resolves with rest.  She was seen at PCP office today for a routine visit.  She states she mentioned her chest pain and they did an EKG which showed tombstones and she was sent to the emergency room.  She was not sent with the EKG, EKG is not accessible in the chart.  She denies recent long road trip, flight, recent surgery, history of DVT, or PE.  She does report that she had COVID last month.  She is not on exogenous estrogen.  The history is provided by the patient. No language interpreter was used.       Home Medications Prior to Admission medications   Medication Sig Start Date End Date Taking? Authorizing Provider  atorvastatin (LIPITOR) 10 MG tablet Take 1 tablet by mouth at bedtime 12/05/21     atorvastatin (LIPITOR) 40 MG tablet Take 40 mg by mouth daily. 04/11/20   [provider]  ergocalciferol (VITAMIN D2) 1.25 MG (50000 UT) capsule Take 1 capsule by mouth once weekly 12/05/21   Candida Peeling, PA-C  Liraglutide -Weight Management (SAXENDA) 18 MG/3ML SOPN Inject 0.6 mg into the skin daily 12/11/21     metoprolol succinate (TOPROL XL) 25 MG 24 hr tablet Take 0.5 tablets (12.5 mg total) by mouth daily. 05/30/21   Loel Dubonnet, NP  metoprolol tartrate (LOPRESSOR) 25 MG tablet Take 1 tablet (25 mg total) by mouth as needed (take as needed for breakthrough palpitations). 05/30/21 08/28/21  Loel Dubonnet, NP  Semaglutide-Weight Management (WEGOVY) 0.25 MG/0.5ML SOAJ Inject 0.'25mg'$  under  the skin once weekly as directed. 12/05/21     topiramate (TOPAMAX) 100 MG tablet Take 2.5 tablets (250 mg total) by mouth 2 (two) times daily. 05/12/19 05/30/21  Daleen Squibb, MD      Allergies    Imitrex [sumatriptan], Zolmitriptan, Elemental sulfur, Latex, Sulfa antibiotics, Aspirin, and Ibuprofen    Review of Systems   Review of Systems  Constitutional:  Negative for chills and fever.  Respiratory:  Positive for shortness of breath.   Cardiovascular:  Positive for chest pain. Negative for palpitations and leg swelling.  Neurological:  Negative for syncope and light-headedness.  All other systems reviewed and are negative.   Physical Exam Updated Vital Signs BP (!) 140/85 (BP Location: Right Arm)   Pulse 64   Temp 97.9 F (36.6 C) (Oral)   Resp 18   LMP 03/12/2018 (Exact Date)   SpO2 100%  Physical Exam Vitals and nursing note reviewed.  Constitutional:      General: She is not in acute distress.    Appearance: Normal appearance. She is not ill-appearing.  HENT:     Head: Normocephalic and atraumatic.     Nose: Nose normal.  Eyes:     General: No scleral icterus.    Extraocular Movements: Extraocular movements intact.     Conjunctiva/sclera: Conjunctivae normal.  Cardiovascular:     Rate and Rhythm: Normal rate and regular rhythm.     Pulses: Normal  pulses.     Comments: Without reproducible chest wall pain Pulmonary:     Effort: Pulmonary effort is normal. No respiratory distress.     Breath sounds: Normal breath sounds. No wheezing or rales.  Abdominal:     General: There is no distension.     Palpations: Abdomen is soft.     Tenderness: There is abdominal tenderness (Left upper quadrant). There is no guarding.  Musculoskeletal:        General: Normal range of motion.     Cervical back: Normal range of motion.     Right lower leg: No edema.     Left lower leg: No edema.  Skin:    General: Skin is warm and dry.  Neurological:     General: No focal  deficit present.     Mental Status: She is alert. Mental status is at baseline.     ED Results / Procedures / Treatments   Labs (all labs ordered are listed, but only abnormal results are displayed) Labs Reviewed  CBC - Abnormal; Notable for the following components:      Result Value   RBC 5.14 (*)    All other components within normal limits  COMPREHENSIVE METABOLIC PANEL - Abnormal; Notable for the following components:   Calcium 8.4 (*)    Alkaline Phosphatase 35 (*)    All other components within normal limits  LIPASE, BLOOD  URINALYSIS, ROUTINE W REFLEX MICROSCOPIC  I-STAT BETA HCG BLOOD, ED ([facility], [facility], [facility] ONLY)  TROPONIN I (HIGH SENSITIVITY)  TROPONIN I (HIGH SENSITIVITY)    EKG EKG Interpretation  Date/Time:  Thursday July 10 2022 15:47:09 EDT Ventricular Rate:  77 PR Interval:  154 QRS Duration: 79 QT Interval:  372 QTC Calculation: 421 R Axis:   36 Text Interpretation: Sinus rhythm Confirmed by Dene Gentry [phone]) on 07/10/2022 10:29:04 PM  Radiology DG Chest 2 View  Result Date: 07/10/2022 CLINICAL DATA:  Chest pain EXAM: CHEST - 2 VIEW COMPARISON:  04/26/2020 FINDINGS: The heart size and mediastinal contours are within normal limits. Both lungs are clear. The visualized skeletal structures are unremarkable. IMPRESSION: No active cardiopulmonary disease. Electronically Signed   By: Elmer Picker M.D.   On: 07/10/2022 17:05    Procedures Procedures    Medications Ordered in ED Medications  morphine (PF) 4 MG/ML injection 4 mg (has no administration in time range)  ondansetron (ZOFRAN) injection 4 mg (has no administration in time range)  lactated ringers bolus 1,000 mL (has no administration in time range)  alum & mag hydroxide-simeth (MAALOX/MYLANTA) 200-200-20 MG/5ML suspension 30 mL (has no administration in time range)    And  lidocaine (XYLOCAINE) 2 % viscous mouth solution 15 mL (has no administration in time range)    ED Course/ Medical  Decision Making/ A&P                           Medical Decision Making Amount and/or Complexity of Data Reviewed Labs: ordered. Radiology: ordered.  Risk OTC drugs. Prescription drug management.   Medical Decision Making / ED Course   This patient presents to the ED for concern of chest pain, dyspnea, this involves an extensive number of treatment options, and is a complaint that carries with it a high risk of complications and morbidity.  The differential diagnosis includes ACS, PE, pneumonia, CHF, MSK etiology, GERD  MDM: 41 year old female with past medical history of gastric sleeve, SVT presents today for evaluation of  above-mentioned complaints.  Denies other cardiac disease.  Denies any significant family history of cardiac disease.  There were concern for tombstone changes on EKG at PCP office.  This is not accessible in the chart and patient was not sent with this EKG.  EKG in the emergency department today shows normal sinus rhythm without acute ischemic changes.  Chest x-ray without acute cardiopulmonary process initial troponin of 3.  Will obtain CTA chest to rule out PE given pleuritic chest pain, abdominal tenderness present will obtain CT abdomen pelvis.  Will provide pain medication, await second troponin and reevaluate following CT imaging. CTA chest without evidence of PE or other acute process.  CT abdomen pelvis without acute intra-abdominal process.  Patient on reevaluation does have mild chest pain still.  Overall low heart score.  Low suspicion for ACS.  Will provide cardiology referral.  Patient will also follow-up with cardiology for further work-up management of her chest pain.  Given the recent URI question if she has costochondritis.  Given history of gastric sleeve will avoid NSAIDs.   Lab Tests: -I ordered, reviewed, and interpreted labs.   The pertinent results include:   Labs Reviewed  CBC - Abnormal; Notable for the following components:      Result Value    RBC 5.14 (*)    All other components within normal limits  COMPREHENSIVE METABOLIC PANEL - Abnormal; Notable for the following components:   Calcium 8.4 (*)    Alkaline Phosphatase 35 (*)    All other components within normal limits  LIPASE, BLOOD  URINALYSIS, ROUTINE W REFLEX MICROSCOPIC  I-STAT BETA HCG BLOOD, ED ([facility], [facility], [facility] ONLY)  TROPONIN I (HIGH SENSITIVITY)  TROPONIN I (HIGH SENSITIVITY)      EKG  EKG Interpretation  Date/Time:  Thursday July 10 2022 15:47:09 EDT Ventricular Rate:  77 PR Interval:  154 QRS Duration: 79 QT Interval:  372 QTC Calculation: 421 R Axis:   36 Text Interpretation: Sinus rhythm Confirmed by Dene Gentry [phone]) on 07/10/2022 10:29:04 PM         Imaging Studies ordered: I ordered imaging studies including chest x-ray, CTA chest, CT abdomen pelvis I independently visualized and interpreted imaging. I agree with the radiologist interpretation   Medicines ordered and prescription drug management: Meds ordered this encounter  Medications   morphine (PF) 4 MG/ML injection 4 mg   ondansetron (ZOFRAN) injection 4 mg   lactated ringers bolus 1,000 mL   AND Linked Order Group    alum & mag hydroxide-simeth (MAALOX/MYLANTA) 200-200-20 MG/5ML suspension 30 mL    lidocaine (XYLOCAINE) 2 % viscous mouth solution 15 mL   iohexol (OMNIPAQUE) 350 MG/ML injection 100 mL    -I have reviewed the patients home medicines and have made adjustments as needed  Cardiac Monitoring: The patient was maintained on a cardiac monitor.  I personally viewed and interpreted the cardiac monitored which showed an underlying rhythm of: Normal sinus rhythm  Co morbidities that complicate the patient evaluation  Past Medical History:  Diagnosis Date   Anemia    Anxiety    Arthritis    L knee OA   Depression    history of depression in Dec 30, 2010 with death of mother   Hyperlipidemia    Hypertension    Migraine    Obesity 02/2017   has had gastric sleeve       Dispostion: Patient is appropriate for discharge.  Discharged in stable condition.  Return precautions discussed. Audiology referral provided.  Discussed follow-up  with PCP. Elevated blood pressure.  Discussed blood pressure diary and follow-up with PCP.  No history of hypertension.  Final Clinical Impression(s) / ED Diagnoses Final diagnoses:  Atypical chest pain  Elevated blood pressure reading    Rx / DC Orders ED Discharge Orders          Ordered    Ambulatory referral to Cardiology       Comments: If you have not heard from the Cardiology office within the next 72 hours please call [phone].   07/11/22 0155              Evlyn Courier, PA-C 07/11/22 4765    Valarie Merino, MD 07/14/22 1452

## 2022-07-11 ENCOUNTER — Encounter (HOSPITAL_COMMUNITY): Payer: Self-pay | Admitting: Emergency Medicine

## 2022-07-11 LAB — TROPONIN I (HIGH SENSITIVITY): Troponin I (High Sensitivity): 3 ng/L (ref <18)

## 2022-07-11 NOTE — Discharge Instructions (Signed)
Your work-up today is overall reassuring.  Normal EKG, normal heart enzymes.  No concern for heart attack.  CT scan of the chest and belly without concerning findings.  Particularly no blood clot within the lungs.  For any concerning symptoms return to the emergency room such as worsening chest pain, shortness of breath.  Otherwise I have given you a follow-up with cardiology.  They will call you to schedule this appointment if you do not hear from them within the next few days please call to schedule this appointment.  Otherwise follow-up with your primary care provider.  Your blood pressure was also elevated.  Keep a blood pressure diary and follow-up with your primary care provider.

## 2022-07-16 NOTE — Progress Notes (Signed)
Office Visit    Patient Name: Tanya [name] Date of Encounter: 07/16/2022  Primary Care Provider:  Willeen Niece, [place] Primary Cardiologist:  Freada Bergeron, MD Primary Electrophysiologist: None  Chief Complaint    Tanya [name] is a 41 y.o. female with PMH of HTN, HLD, SVT, obesity, depression, gastric sleeve, anxiety who presents today for complaint of atypical chest pain.  Past Medical History    Past Medical History:  Diagnosis Date   Anemia    Anxiety    Arthritis    L knee OA   Depression    history of depression in 01-14-2011 with death of mother   Hyperlipidemia    Hypertension    Migraine    Obesity 02/2017   has had gastric sleeve   Past Surgical History:  Procedure Laterality Date   ABDOMINAL HYSTERECTOMY     ANAL RECTAL MANOMETRY N/A 01/11/2018   Procedure: ANO RECTAL MANOMETRY;  Surgeon: Mauri Pole, MD;  Location: [facility];  Service: Endoscopy;  Laterality: N/A;   CARPAL TUNNEL RELEASE Bilateral    CESAREAN SECTION     x 1   CHOLECYSTECTOMY     KNEE SURGERY Left    LAPAROSCOPIC GASTRIC SLEEVE RESECTION N/A 02/17/2017   Procedure: LAPAROSCOPIC GASTRIC SLEEVE RESECTION, UPPER ENDO;  Surgeon: Johnathan Hausen, MD;  Location: [facility];  Service: General;  Laterality: N/A;   LAPAROSCOPIC UNILATERAL SALPINGECTOMY Left 06/10/2014   Procedure: LAPAROSCOPIC Left SALPINGECTOMY, removal of ectopic pregnancy;  Surgeon: Guss Bunde, MD;  Location: [facility];  Service: Gynecology;  Laterality: Left;   TUBAL LIGATION     VAGINAL HYSTERECTOMY Bilateral 03/25/2018   Procedure: HYSTERECTOMY VAGINAL, RIGHT SALPINGECTOMY;  Surgeon: Eldred Manges, MD;  Location: [facility];  Service: Gynecology;  Laterality: Bilateral;  ERAS   WISDOM TOOTH EXTRACTION      Allergies  Allergies  Allergen Reactions   Imitrex [Sumatriptan] Shortness Of Breath   Zolmitriptan Shortness Of Breath and Swelling   Elemental Sulfur Rash    Pt states that  her mother told her that she was allergic   Latex Hives and Swelling   Sulfa Antibiotics Other (See Comments) and Rash    Unknown. Since childhood.  DUE TO SURGERY    Aspirin Other (See Comments)    "cant take because I had bariatric surgery"   Ibuprofen Other (See Comments)    Can not take because of surgery (No NSAIDS)    History of Present Illness    Tanya [name]  is a 41 year old female with the above mention past medical history who presents today for complaint of atypical chest pain.  She was initially seen by Dr. Acie Fredrickson in 2017/01/13 for complaint of orthostatic hypotension.  2D echo was completed showing EF of 60-65%, no RWMA with mild LVH grade 1 DD with normal valve functions.  She was diagnosed with SVT and was discharged after IV hydration with no new medications.    She was seen by Dr. Johney Frame in 01-14-2020 for complaint of palpitations.  Patient had experienced palpitations with Prozac and despite holding medications palpitations reoccurred.  She described each episode as lasting 2 to 4 minutes with heart rate ranging from 110-170 bpm.  She reported a palpitations that occurred several times per day.  She was given 7-day Zio patch and started on Toprol tartrate 25 mg twice daily.  Patient ZIO was essentially normal with 4 beats of SVT but no significant arrhythmias or blocks.  Encouraged to increase salt intake and use compression stockings to help with orthostasis.  She was last seen by [name] Montana, NP on 05/2021 for follow-up of SVT.  During visit patient stated that she never started metoprolol.  She is also admitted to restarting atorvastatin 40 mg recently.  She was started on Toprol 12.5 mg daily with prescription for as needed Lopressor for breakthrough palpitations.  Patient was also advised to remove triggers such as alcohol, caffeine and increase stress.  She was seen in the ED on 07/10/2022 with complaint of chest pain that radiated to left shoulder blades.  Patient had EKG  obtained that showed sinus rhythm with acute ischemic changes.  Cardiac CTA was obtained to rule out PE that was negative.  Tanya [name] presents today for post ED visit of chest pain.  Since last being seen in the office patient reports that she is still having occasional feelings of chest pain and burning that are relieved with rest and occur with and without activity.  She states that her symptoms occurred after being sick for 4 days with stomach flu.  She was seen by her PCP and was sent to the ED due to abnormal EKG.  She denies any shortness of breath with these episodes.  Her blood pressure today was well controlled at 124/78 with heart rate of 57 bpm.  Patient denies chest pain, palpitations, dyspnea, PND, orthopnea, nausea, vomiting, dizziness, syncope, edema, weight gain, or early satiety.  Home Medications    Current Outpatient Medications  Medication Sig Dispense Refill   atorvastatin (LIPITOR) 10 MG tablet Take 1 tablet by mouth at bedtime 30 tablet 5   atorvastatin (LIPITOR) 40 MG tablet Take 40 mg by mouth daily.     ergocalciferol (VITAMIN D2) 1.25 MG (50000 UT) capsule Take 1 capsule by mouth once weekly 4 capsule 5   Liraglutide -Weight Management (SAXENDA) 18 MG/3ML SOPN Inject 0.6 mg into the skin daily 9 mL 0   metoprolol succinate (TOPROL XL) 25 MG 24 hr tablet Take 0.5 tablets (12.5 mg total) by mouth daily. 15 tablet 2   metoprolol tartrate (LOPRESSOR) 25 MG tablet Take 1 tablet (25 mg total) by mouth as needed (take as needed for breakthrough palpitations). 30 tablet 0   Semaglutide-Weight Management (WEGOVY) 0.25 MG/0.5ML SOAJ Inject 0.'25mg'$  under the skin once weekly as directed. 4 mL 0   topiramate (TOPAMAX) 100 MG tablet Take 2.5 tablets (250 mg total) by mouth 2 (two) times daily. 150 tablet 2   Current Facility-Administered Medications  Medication Dose Route Frequency Provider Last Rate Last Admin   cyanocobalamin ((VITAMIN B-12)) injection 1,000 mcg  1,000 mcg  Intramuscular Q30 days Wardell Honour, MD   1,000 mcg at 01/02/17 9470     Review of Systems  Please see the history of present illness.    (+) Chest pressure (+) Shortness of breath with heavy exertion  All other systems reviewed and are otherwise negative except as noted above.  Physical Exam    Wt Readings from Last 3 Encounters:  05/30/21 225 lb (102.1 kg)  07/18/20 220 lb 12.8 oz (100.2 kg)  04/26/20 199 lb (90.3 kg)   JG:GEZMO were no vitals filed for this visit.,There is no height or weight on file to calculate BMI.  Constitutional:      Appearance: Healthy appearance. Not in distress.  Neck:     Vascular: JVD normal.  Pulmonary:     Effort: Pulmonary effort is normal.     Breath  sounds: No wheezing. No rales. Diminished in the bases Cardiovascular:     Normal rate. Regular rhythm. Normal S1. Normal S2.      Murmurs: There is no murmur.  Edema:    Peripheral edema absent.  Abdominal:     Palpations: Abdomen is soft non tender. There is no hepatomegaly.  Skin:    General: Skin is warm and dry.  Neurological:     General: No focal deficit present.     Mental Status: Alert and oriented to person, place and time.     Cranial Nerves: Cranial nerves are intact.  EKG/LABS/Other Studies Reviewed    ECG personally reviewed by me today -none completed today   Lab Results  Component Value Date   WBC 7.3 07/10/2022   HGB 14.1 07/10/2022   HCT 43.1 07/10/2022   MCV 83.9 07/10/2022   PLT 368 07/10/2022   Lab Results  Component Value Date   CREATININE 0.52 07/10/2022   BUN 9 07/10/2022   NA 139 07/10/2022   K 3.8 07/10/2022   CL 109 07/10/2022   CO2 23 07/10/2022   Lab Results  Component Value Date   ALT 29 07/10/2022   AST 25 07/10/2022   ALKPHOS 35 (L) 07/10/2022   BILITOT 0.3 07/10/2022   Lab Results  Component Value Date   CHOL 169 07/20/2020   HDL 55 07/20/2020   LDLCALC 104 (H) 07/20/2020   TRIG 52 07/20/2020   CHOLHDL 3.1 07/20/2020    Lab  Results  Component Value Date   HGBA1C 5.2 07/27/2019    Assessment & Plan    1.  Atypical chest pain: -Patient was recently seen in the ED on 07/10/2022 with chest pain that she reported as radiating to her chest. -Today patient reports occasional chest pressure is resolved without any intervention. -We we will order a exercise treadmill test to evaluate possible ischemia and verify patient's exercise capacity. -She was advised to report back to the emergency room if chest pain recurs and becomes unrelieved with rest   2.  History of SVT: -2D echo was completed 2018 with normal EF and grade 1 DD noted.  Patient had Zio patch for 7 days that revealed overall rhythm of sinus with 3 episodes of SVT -Today patient reports short bursts of tachycardia that occurred following stomach flu for 4 days. -Continue Toprol 12.5 mg as needed for breakthrough palpitations  3.  Hyperlipidemia: -Last LDL cholesterol was 104 in 2021 -Patient states that she self discontinued atorvastatin 40 mg because she does not like taking medications. -She is scheduled to have her lipids drawn by her PCP next week I advised that we will may restart based on findings.  4.  History of gastric sleeve: -Patient's current BMI is 45.18 kg/m  -Increase physical activity as tolerated       Disposition: Follow-up with Freada Bergeron, MD or APP in 1 months Shared Decision Making/Informed Consent The risks [chest pain, shortness of breath, cardiac arrhythmias, dizziness, blood pressure fluctuations, myocardial infarction, stroke/transient ischemic attack, and life-threatening complications (estimated to be 1 in 10,000)], benefits (risk stratification, diagnosing coronary artery disease, treatment guidance) and alternatives of an exercise tolerance test were discussed in detail with Tanya [name] and she agrees to proceed.   Medication Adjustments/Labs and Tests Ordered: Current medicines are reviewed at length with the  patient today.  Concerns regarding medicines are outlined above.   Signed, Mable Fill, Marissa Nestle, NP 07/16/2022, 1:33 PM [place]  Note:  This document was prepared using Dragon voice recognition software and may include unintentional dictation errors.

## 2022-07-17 ENCOUNTER — Ambulatory Visit: Payer: Medicaid Other | Attending: Nurse Practitioner | Admitting: Nurse Practitioner

## 2022-07-17 ENCOUNTER — Encounter: Payer: Self-pay | Admitting: Nurse Practitioner

## 2022-07-17 VITALS — BP 124/78 | HR 57 | Ht 62.0 in | Wt 247.0 lb

## 2022-07-17 DIAGNOSIS — R0789 Other chest pain: Secondary | ICD-10-CM

## 2022-07-17 DIAGNOSIS — R0602 Shortness of breath: Secondary | ICD-10-CM

## 2022-07-17 DIAGNOSIS — I471 Supraventricular tachycardia, unspecified: Secondary | ICD-10-CM | POA: Diagnosis not present

## 2022-07-17 MED ORDER — METOPROLOL SUCCINATE ER 25 MG PO TB24: 12.5000 mg | ORAL_TABLET | ORAL | 3 refills | Status: AC | PRN

## 2022-07-17 NOTE — Patient Instructions (Addendum)
Medication Instructions:  Start Metoprolol (Toprol XL) 12.5 mg as needed for palpitations   *If you need a refill on your cardiac medications before your next appointment, please call your pharmacy*   Lab Work: None ordered   If you have labs (blood work) drawn today and your tests are completely normal, you will receive your results only by: [facility] (if you have MyChart) OR A paper copy in the mail If you have any lab test that is abnormal or we need to change your treatment, we will call you to review the results.   Testing/Procedures: Your physician has requested that you have an exercise tolerance test. For further information please visit [email or web address]. Please also follow instruction sheet, as given.    Follow-Up: At [facility], you and your health needs are our priority.  As part of our continuing mission to provide you with exceptional heart care, we have created designated Provider Care Teams.  These Care Teams include your primary Cardiologist (physician) and Advanced Practice Providers (APPs -  Physician Assistants and Nurse Practitioners) who all work together to provide you with the care you need, when you need it.  We recommend signing up for the patient portal called "MyChart".  Sign up information is provided on this After Visit Summary.  MyChart is used to connect with patients for Virtual Visits (Telemedicine).  Patients are able to view lab/test results, encounter notes, upcoming appointments, etc.  Non-urgent messages can be sent to your provider as well.   To learn more about what you can do with MyChart, go to [email or web address].    Your next appointment:   1 month(s)  The format for your next appointment:   In Person  Provider:   Freada Bergeron, MD  or APP    Other Instructions   Important Information About [facility]

## 2022-07-17 NOTE — Addendum Note (Signed)
Addended by: Tempie Donning on: 07/17/2022 03:25 PM   Modules accepted: Orders

## 2022-07-22 ENCOUNTER — Ambulatory Visit
Admission: RE | Admit: 2022-07-22 | Discharge: 2022-07-22 | Disposition: A | Discharge status: Home / Self Care | Payer: Medicaid Other | Source: Ambulatory Visit | Attending: Physician Assistant | Admitting: Physician Assistant

## 2022-07-22 DIAGNOSIS — R928 Other abnormal and inconclusive findings on diagnostic imaging of breast: Secondary | ICD-10-CM

## 2022-07-24 ENCOUNTER — Other Ambulatory Visit (HOSPITAL_COMMUNITY): Payer: Self-pay

## 2022-07-24 MED ORDER — VENLAFAXINE HCL ER 37.5 MG PO CP24
37.5000 mg | ORAL_CAPSULE | Freq: Every day | ORAL | 2 refills | Status: AC
  Filled 2022-07-24: qty 30, 30d supply, fill #0

## 2022-07-25 ENCOUNTER — Other Ambulatory Visit (HOSPITAL_COMMUNITY): Payer: Self-pay

## 2022-07-25 MED ORDER — ATORVASTATIN CALCIUM 40 MG PO TABS
40.0000 mg | ORAL_TABLET | Freq: Every day | ORAL | 5 refills | Status: DC
  Filled 2022-07-25: qty 30, 30d supply, fill #0
  Filled 2023-02-05 (×2): qty 30, 30d supply, fill #1
  Filled 2023-03-29: qty 30, 30d supply, fill #2
  Filled 2023-05-08 (×2): qty 30, 30d supply, fill #3
  Filled 2023-06-05: qty 30, 30d supply, fill #4

## 2022-07-29 ENCOUNTER — Ambulatory Visit: Payer: Medicaid Other | Attending: Nurse Practitioner

## 2022-07-29 DIAGNOSIS — R0789 Other chest pain: Secondary | ICD-10-CM

## 2022-07-29 DIAGNOSIS — R0602 Shortness of breath: Secondary | ICD-10-CM | POA: Diagnosis not present

## 2022-07-29 LAB — EXERCISE TOLERANCE TEST
Angina Index: 0
Duke Treadmill Score: 7
Estimated workload: 7.7
Exercise duration (min): 6 min
Exercise duration (sec): 30 s
MPHR: 177 {beats}/min
Peak HR: 166 {beats}/min
Percent HR: 93 %
RPE: 17
Rest HR: 70 {beats}/min
ST Depression (mm): 0 mm

## 2022-08-07 ENCOUNTER — Other Ambulatory Visit (HOSPITAL_COMMUNITY): Payer: Self-pay

## 2022-08-07 MED ORDER — VENLAFAXINE HCL ER 75 MG PO CP24
75.0000 mg | ORAL_CAPSULE | Freq: Every day | ORAL | 2 refills | Status: DC
  Filled 2022-08-07: qty 30, 30d supply, fill #0
  Filled 2022-09-10: qty 30, 30d supply, fill #1
  Filled 2022-10-20: qty 30, 30d supply, fill #2

## 2022-08-08 ENCOUNTER — Other Ambulatory Visit (HOSPITAL_COMMUNITY): Payer: Self-pay

## 2022-08-18 NOTE — Progress Notes (Deleted)
Cardiology Office Note:    Date:  08/18/2022   ID:  Tanya [name], DOB 08-23-1981, MRN [id number]  PCP:  Willeen Niece, PA  [facility] Cardiologist:  Freada Bergeron, MD  [facility] Electrophysiologist:  None   Referring MD: Willeen Niece, PA    History of Present Illness:    Tanya [name] is a 41 y.o. female with a hx of anxiety, depression, HTN, HLD, SVT, and obesity who presents to clinic for follow-up.  Patient was diagnosed with SVT in 2016-12-24. She stayed a few nights in the hospital and was discharged without medications. She was initially seen by me in 07/2020 with recurrent SVT. Previous sleep study with no evidence of OSA. Cardiac monitor 08/2020 showed rare SVT with longest lasting 4 beats with no significant ectopy.  Given symptoms, she was started on metop '25mg'$  BID  Was seen in follow-up by [name] Montana in 05/2021. She had not started her metoprolol and had just started taking her lipitor. At that visit, she was started on toprolol 12.'5mg'$  XL daily with prn for breakthrough.   She was seen in the ER 07/10/2022 for chest pain. Work-up reassuring with nonischemic ECG, negative trops, CTA negative for PE. Was seen in follow-up by Ambrose Pancoast. ETT 07/2022 showed no evidence of ischemic changes. Achieved 7.7 METs (44mn 30s of exercise).  Today, ***  Past Medical History:  Diagnosis Date   Anemia    Anxiety    Arthritis    L knee OA   Depression    history of depression in 203-21-2012with death of mother   Hyperlipidemia    Hypertension    Migraine    Obesity 02/2017   has had gastric sleeve    Past Surgical History:  Procedure Laterality Date   ABDOMINAL HYSTERECTOMY     ANAL RECTAL MANOMETRY N/A 01/11/2018   Procedure: ANO RECTAL MANOMETRY;  Surgeon: NMauri Pole MD;  Location: [facility];  Service: Endoscopy;  Laterality: N/A;   CARPAL TUNNEL RELEASE Bilateral    CESAREAN SECTION     x 1   CHOLECYSTECTOMY     KNEE SURGERY Left     LAPAROSCOPIC GASTRIC SLEEVE RESECTION N/A 02/17/2017   Procedure: LAPAROSCOPIC GASTRIC SLEEVE RESECTION, UPPER ENDO;  Surgeon: MJohnathan Hausen MD;  Location: [facility];  Service: General;  Laterality: N/A;   LAPAROSCOPIC UNILATERAL SALPINGECTOMY Left 06/10/2014   Procedure: LAPAROSCOPIC Left SALPINGECTOMY, removal of ectopic pregnancy;  Surgeon: KGuss Bunde MD;  [facility];  Service: Gynecology;  Laterality: Left;   TUBAL LIGATION     VAGINAL HYSTERECTOMY Bilateral 03/25/2018   Procedure: HYSTERECTOMY VAGINAL, RIGHT SALPINGECTOMY;  Surgeon: HEldred Manges MD;  [place]  Service: Gynecology;  Laterality: Bilateral;  ERAS   WISDOM TOOTH EXTRACTION      Current Medications: No outpatient medications have been marked as taking for the 08/20/22 encounter (Appointment) with PFreada Bergeron MD.     Allergies:   Imitrex [sumatriptan], Zolmitriptan, Elemental sulfur, Latex, Sulfa antibiotics, Aspirin, Nsaids, and Ibuprofen   Social History   Socioeconomic History   Marital status: Married    Spouse name: JJeneen Rinks  Number of children: 2   Years of education: Not on file   Highest education level: Not on file  Occupational History   Occupation: nResearch scientist (life sciences) [employer] Tobacco Use   Smoking status: Never   Smokeless tobacco: Never  Vaping Use   Vaping Use: Never  used  Substance and Sexual Activity   Alcohol use: Yes    Comment: socially   Drug use: No   Sexual activity: Yes    Birth control/protection: Surgical    Comment: tubal ligation 2002  Other Topics Concern   Not on file  Social History Narrative   Marital status: married x 6 years; second marriage      Children: 2 children (75, 14); 1 stepgrandaughter; 1 stepdaughter      Lives: with husband, one son      Employment: Chartered certified accountant since 2017; fifth floor surgical floor; third shift 3 twelve hour shifts      Tobacco: none      Alcohol: none      Drugs; none       Exercise: none   Social Determinants of Radio broadcast assistant Strain: Not on file  Food Insecurity: Not on file  Transportation Needs: Not on file  Physical Activity: Not on file  Stress: Not on file  Social Connections: Not on file     Family History: The patient's family history includes Breast cancer in her maternal aunt; Breast cancer (age of onset: 3) in her sister; Breast cancer (age of onset: 23) in her mother; Cancer in her mother and another family member; Colon polyps in her father; Coronary artery disease in an other family member; Diabetes in her paternal grandmother, paternal uncle, and another family member; Heart disease in her maternal grandmother; Ovarian cancer (age of onset: 3) in her sister; Prostate cancer in her paternal grandfather, paternal uncle, paternal uncle, and paternal uncle; Prostate cancer (age of onset: 17) in her father; Stroke in an other family member. There is no history of Colon cancer, Esophageal cancer, or Rectal cancer.  ROS:   Please see the history of present illness.    The patient denies chest pain, chest pressure, dyspnea at rest or with exertion, PND, orthopnea, or leg swelling. Denies cough, fever, chills. Denies nausea, vomiting. Denies syncope or presyncope. Denies dizziness or lightheadedness. Denies snoring.  EKGs/Labs/Other Studies Reviewed:    The following studies were reviewed today: ETT 2022/07/22:   No ST deviation was noted.   Normal ECG stress test.  Cardiac Monitor 08/2020: Predominant rhythm is sinus with average HR 68 3 episodes of SVT lasting only 4 beats. Episodes were not associated with patient triggered events. Rare PVCs, rare PACs No Afib, VT, or block  TTE 2018: Study Conclusions   - Left ventricle: The cavity size was normal. Wall thickness was    increased in a pattern of mild LVH. Systolic function was normal.    The estimated ejection fraction was in the range of 60% to 65%.    Wall motion was  normal; there were no regional wall motion    abnormalities. Doppler parameters are consistent with high    ventricular filling pressure.   Impressions:   - Normal LV systolic function; mild LVH; grade 1 diastolic    function.   Sleep study 2018: RECOMMENDATIONS:   1. This study does not demonstrate any significant obstructive or  central sleep disordered breathing. There was evidence of mild,  REM sleep related OSA, for which CPAP therapy is not warranted;  mild to moderate snoring was noted. Avoidance of the supine sleep  position and weight loss will likely help the snoring and mild  REM related OSA.  2. This study shows sleep fragmentation and abnormal sleep stage  percentages; these are nonspecific findings and per se do not  signify an intrinsic sleep disorder or a cause for the patient's  sleep-related symptoms. Causes include (but are not limited to)  the first night effect of the sleep study, circadian rhythm  disturbances, medication effect or an underlying mood disorder or  medical problem.  3. The patient should be cautioned not to drive, work at heights,  or operate dangerous or heavy equipment when tired or sleepy.  Review and reiteration of good sleep hygiene measures should be  pursued with any patient.  4. The patient will be offered a follow-up appointment at [facility] for  discussion of the test results and further management strategies.  The referring provider will be notified of the test results.   CT abdomen/pelvis 12/2018: FINDINGS: Lower chest: Lung bases are normal.   Hepatobiliary: Previous cholecystectomy. Liver and biliary tree are normal.   Pancreas: Normal.   Spleen: Normal.   Adrenals/Urinary Tract: Adrenal glands are normal. Kidneys are normal in size without hydronephrosis or nephrolithiasis. Ureters and bladder are normal.   Stomach/Bowel: Evidence of previous gastric bypass surgery. Small bowel is normal. Appendix is normal. Colon is  normal.   Vascular/Lymphatic: Minimal calcified plaque over the infrarenal abdominal aorta. No adenopathy.   Reproductive: Previous hysterectomy.   Other: Tiny amount of free fluid in the pelvis. No focal inflammatory change. No free peritoneal air.   Musculoskeletal: No acute findings.   IMPRESSION: No acute findings in the abdomen/pelvis  EKG:  EKG is  ordered today.  The ekg ordered today demonstrates NSR with HR 64.  Recent Labs: 07/10/2022: ALT 29; BUN 9; Creatinine, Ser 0.52; Hemoglobin 14.1; Platelets 368; Potassium 3.8; Sodium 139  Recent Lipid Panel    Component Value Date/Time   CHOL 169 07/20/2020 1011   TRIG 52 07/20/2020 1011   HDL 55 07/20/2020 1011   CHOLHDL 3.1 07/20/2020 1011   CHOLHDL 7.1 10/05/2007 0545   VLDL 31 10/05/2007 0545   LDLCALC 104 (H) 07/20/2020 1011      Physical Exam:    VS:  LMP 03/12/2018 (Exact Date)     Wt Readings from Last 3 Encounters:  07/17/22 247 lb (112 kg)  05/30/21 225 lb (102.1 kg)  07/18/20 220 lb 12.8 oz (100.2 kg)     GEN:  Well nourished, well developed in no acute distress HEENT: Normal NECK: No JVD; No carotid bruits LYMPHATICS: No lymphadenopathy CARDIAC: RRR, no murmurs, rubs, gallops RESPIRATORY:  Clear to auscultation without rales, wheezing or rhonchi  ABDOMEN: Soft, non-tender, non-distended MUSCULOSKELETAL:  No edema; No deformity  SKIN: Warm and dry NEUROLOGIC:  Alert and oriented x 3 PSYCHIATRIC:  Normal affect   ASSESSMENT:    No diagnosis found.  PLAN:    In order of problems listed above:  #SVT: Cardiac monitor 08/2020 with no significant ectopy. Has been started on metop 12.'5mg'$  XL daily with improvement of symptoms. -Continue metop 12.'5mg'$  XL daily  #Hyperlipidemia: Has not been taking lipitor. LDL currently, ***  #Atypical Chest Pain: Reassuring ETT with no evidence of ischemia. Currently, ***.  Exercise recommendations: Goal of exercising for at least 30 minutes a day, at least  5 times per week.  Please exercise to a moderate exertion.  This means that while exercising it is difficult to speak in full sentences, however you are not so short of breath that you feel you must stop, and not so comfortable that you can carry on a full conversation.  Exertion level should be approximately a 5/10, if 10 is the most exertion you can  perform.  Diet recommendations: Recommend a heart healthy diet such as the Mediterranean diet.  This diet consists of plant based foods, healthy fats, lean meats, olive oil.  It suggests limiting the intake of simple carbohydrates such as white breads, pastries, and pastas.  It also limits the amount of red meat, wine, and dairy products such as cheese that one should consume on a daily basis.   Medication Adjustments/Labs and Tests Ordered: Current medicines are reviewed at length with the patient today.  Concerns regarding medicines are outlined above.  No orders of the defined types were placed in this encounter.  No orders of the defined types were placed in this encounter.   There are no Patient Instructions on file for this visit.   Signed, Freada Bergeron, MD  08/18/2022 11:18 AM    [place]

## 2022-08-20 ENCOUNTER — Ambulatory Visit: Payer: Medicaid Other | Admitting: Cardiology

## 2022-08-20 NOTE — Progress Notes (Incomplete)
Cardiology Office Note:    Date:  08/20/2022   ID:  Tanya [name], DOB Nov 13, 1980, MRN [id number]  PCP:  Willeen Niece, PA  [facility] Cardiologist:  Freada Bergeron, MD  [facility] Electrophysiologist:  None   Referring MD: Willeen Niece, PA    History of Present Illness:    Tanya [name] is a 41 y.o. female with a hx of anxiety, depression, HTN, HLD, SVT, and obesity who presents to clinic for follow-up.  Patient was diagnosed with SVT in 08-Jan-2017. She stayed a few nights in the hospital and was discharged without medications. She was initially seen by me in 07/2020 with recurrent SVT. Previous sleep study with no evidence of OSA. Cardiac monitor 08/2020 showed rare SVT with longest lasting 4 beats with no significant ectopy.  Given symptoms, she was started on metop '25mg'$  BID  Was seen in follow-up by [name] Montana in 05/2021. She had not started her metoprolol and had just started taking her lipitor. At that visit, she was started on toprolol 12.'5mg'$  XL daily with prn for breakthrough.   She was seen in the ER 07/10/2022 for chest pain. Work-up reassuring with nonischemic ECG, negative trops, CTA negative for PE. Was seen in follow-up by Ambrose Pancoast. ETT 07/2022 showed no evidence of ischemic changes. Achieved 7.7 METs (17mn 30s of exercise).  Today, ***  Past Medical History:  Diagnosis Date   Anemia    Anxiety    Arthritis    L knee OA   Depression    history of depression in 204/05/12with death of mother   Hyperlipidemia    Hypertension    Migraine    Obesity 02/2017   has had gastric sleeve    Past Surgical History:  Procedure Laterality Date   ABDOMINAL HYSTERECTOMY     ANAL RECTAL MANOMETRY N/A 01/11/2018   Procedure: ANO RECTAL MANOMETRY;  Surgeon: NMauri Pole MD;  [facility];  Service: Endoscopy;  Laterality: N/A;   CARPAL TUNNEL RELEASE Bilateral    CESAREAN SECTION     x 1   CHOLECYSTECTOMY     KNEE SURGERY Left     LAPAROSCOPIC GASTRIC SLEEVE RESECTION N/A 02/17/2017   Procedure: LAPAROSCOPIC GASTRIC SLEEVE RESECTION, UPPER ENDO;  Surgeon: MJohnathan Hausen MD;  [facility];  Service: General;  Laterality: N/A;   LAPAROSCOPIC UNILATERAL SALPINGECTOMY Left 06/10/2014   Procedure: LAPAROSCOPIC Left SALPINGECTOMY, removal of ectopic pregnancy;  Surgeon: KGuss Bunde MD;  [facility];  Service: Gynecology;  Laterality: Left;   TUBAL LIGATION     VAGINAL HYSTERECTOMY Bilateral 03/25/2018   Procedure: HYSTERECTOMY VAGINAL, RIGHT SALPINGECTOMY;  Surgeon: HEldred Manges MD;  [place] [facility]  Service: Gynecology;  Laterality: Bilateral;  ERAS   WISDOM TOOTH EXTRACTION      Current Medications: No outpatient medications have been marked as taking for the 08/20/22 encounter (Appointment) with PFreada Bergeron MD.     Allergies:   Imitrex [sumatriptan], Zolmitriptan, Elemental sulfur, Latex, Sulfa antibiotics, Aspirin, Nsaids, and Ibuprofen   Social History   Socioeconomic History   Marital status: Married    Spouse name: JJeneen Rinks  Number of children: 2   Years of education: Not on file   Highest education level: Not on file  Occupational History   Occupation: nResearch scientist (life sciences) [place] Tobacco Use   Smoking status: Never   Smokeless tobacco: Never  Vaping Use   Vaping Use: Never  used  Substance and Sexual Activity   Alcohol use: Yes    Comment: socially   Drug use: No   Sexual activity: Yes    Birth control/protection: Surgical    Comment: tubal ligation 2002  Other Topics Concern   Not on file  Social History Narrative   Marital status: married x 6 years; second marriage      Children: 2 children (36, 49); 1 stepgrandaughter; 1 stepdaughter      Lives: with husband, one son      Employment: Chartered certified accountant since 2017; fifth floor surgical floor; third shift 3 twelve hour shifts      Tobacco: none      Alcohol: none      Drugs; none       Exercise: none   Social Determinants of Radio broadcast assistant Strain: Not on file  Food Insecurity: Not on file  Transportation Needs: Not on file  Physical Activity: Not on file  Stress: Not on file  Social Connections: Not on file     Family History: The patient's family history includes Breast cancer in her maternal aunt; Breast cancer (age of onset: 63) in her sister; Breast cancer (age of onset: 69) in her mother; Cancer in her mother and another family member; Colon polyps in her father; Coronary artery disease in an other family member; Diabetes in her paternal grandmother, paternal uncle, and another family member; Heart disease in her maternal grandmother; Ovarian cancer (age of onset: 15) in her sister; Prostate cancer in her paternal grandfather, paternal uncle, paternal uncle, and paternal uncle; Prostate cancer (age of onset: 66) in her father; Stroke in an other family member. There is no history of Colon cancer, Esophageal cancer, or Rectal cancer.  ROS:   Please see the history of present illness.    Review of Systems  Constitutional:  Negative for chills and fever.  HENT:  Negative for nosebleeds and tinnitus.   Eyes:  Negative for blurred vision and pain.  Respiratory:  Negative for cough, hemoptysis, shortness of breath and stridor.   Cardiovascular:  Negative for chest pain, palpitations, orthopnea, claudication, leg swelling and PND.  Gastrointestinal:  Negative for blood in stool, diarrhea, nausea and vomiting.  Genitourinary:  Negative for dysuria and hematuria.  Musculoskeletal:  Negative for falls.  Neurological:  Negative for dizziness, loss of consciousness and headaches.  Psychiatric/Behavioral:  Negative for depression, hallucinations and substance abuse. The patient does not have insomnia.      EKGs/Labs/Other Studies Reviewed:    The following studies were reviewed today:  ETT 07/2022:   No ST deviation was noted.   Normal ECG stress  test.  Cardiac Monitor 08/2020: Predominant rhythm is sinus with average HR 68 3 episodes of SVT lasting only 4 beats. Episodes were not associated with patient triggered events. Rare PVCs, rare PACs No Afib, VT, or block  CT abdomen/pelvis 12/2018: FINDINGS: Lower chest: Lung bases are normal.   Hepatobiliary: Previous cholecystectomy. Liver and biliary tree are normal.   Pancreas: Normal.   Spleen: Normal.   Adrenals/Urinary Tract: Adrenal glands are normal. Kidneys are normal in size without hydronephrosis or nephrolithiasis. Ureters and bladder are normal.   Stomach/Bowel: Evidence of previous gastric bypass surgery. Small bowel is normal. Appendix is normal. Colon is normal.   Vascular/Lymphatic: Minimal calcified plaque over the infrarenal abdominal aorta. No adenopathy.   Reproductive: Previous hysterectomy.   Other: Tiny amount of free fluid in the pelvis. No focal inflammatory change. No free  peritoneal air.   Musculoskeletal: No acute findings.   IMPRESSION: No acute findings in the abdomen/pelvis  TTE 2018: Study Conclusions   - Left ventricle: The cavity size was normal. Wall thickness was    increased in a pattern of mild LVH. Systolic function was normal.    The estimated ejection fraction was in the range of 60% to 65%.    Wall motion was normal; there were no regional wall motion    abnormalities. Doppler parameters are consistent with high    ventricular filling pressure.   Impressions:   - Normal LV systolic function; mild LVH; grade 1 diastolic    function.   Sleep study 2018: RECOMMENDATIONS:   1. This study does not demonstrate any significant obstructive or  central sleep disordered breathing. There was evidence of mild,  REM sleep related OSA, for which CPAP therapy is not warranted;  mild to moderate snoring was noted. Avoidance of the supine sleep  position and weight loss will likely help the snoring and mild  REM related OSA.   2. This study shows sleep fragmentation and abnormal sleep stage  percentages; these are nonspecific findings and per se do not  signify an intrinsic sleep disorder or a cause for the patient's  sleep-related symptoms. Causes include (but are not limited to)  the first night effect of the sleep study, circadian rhythm  disturbances, medication effect or an underlying mood disorder or  medical problem.  3. The patient should be cautioned not to drive, work at heights,  or operate dangerous or heavy equipment when tired or sleepy.  Review and reiteration of good sleep hygiene measures should be  pursued with any patient.  4. The patient will be offered a follow-up appointment at [facility] for  discussion of the test results and further management strategies.  The referring provider will be notified of the test results.   EKG:  EKG is  ordered today.   *** 07/18/2020: NSR with HR 64.  Recent Labs: 07/10/2022: ALT 29; BUN 9; Creatinine, Ser 0.52; Hemoglobin 14.1; Platelets 368; Potassium 3.8; Sodium 139  Recent Lipid Panel    Component Value Date/Time   CHOL 169 07/20/2020 1011   TRIG 52 07/20/2020 1011   HDL 55 07/20/2020 1011   CHOLHDL 3.1 07/20/2020 1011   CHOLHDL 7.1 10/05/2007 0545   VLDL 31 10/05/2007 0545   LDLCALC 104 (H) 07/20/2020 1011      Physical Exam:    VS:  LMP 03/12/2018 (Exact Date)     Wt Readings from Last 3 Encounters:  07/17/22 247 lb (112 kg)  05/30/21 225 lb (102.1 kg)  07/18/20 220 lb 12.8 oz (100.2 kg)     GEN:  Well nourished, well developed in no acute distress HEENT: Normal NECK: No JVD; No carotid bruits LYMPHATICS: No lymphadenopathy CARDIAC: RRR, no murmurs, rubs, gallops RESPIRATORY:  Clear to auscultation without rales, wheezing or rhonchi  ABDOMEN: Soft, non-tender, non-distended MUSCULOSKELETAL:  No edema; No deformity  SKIN: Warm and dry NEUROLOGIC:  Alert and oriented x 3 PSYCHIATRIC:  Normal affect   ASSESSMENT:    No diagnosis  found.  PLAN:    In order of problems listed above:  #SVT: Cardiac monitor 08/2020 with no significant ectopy. Has been started on metop 12.'5mg'$  XL daily with improvement of symptoms. -Continue metop 12.'5mg'$  XL daily  #Hyperlipidemia: Has not been taking lipitor. LDL currently, ***  #Atypical Chest Pain: Reassuring ETT with no evidence of ischemia. Currently, ***.  Follow up: ***  Exercise recommendations: Goal of exercising for at least 30 minutes a day, at least 5 times per week.  Please exercise to a moderate exertion.  This means that while exercising it is difficult to speak in full sentences, however you are not so short of breath that you feel you must stop, and not so comfortable that you can carry on a full conversation.  Exertion level should be approximately a 5/10, if 10 is the most exertion you can perform.  Diet recommendations: Recommend a heart healthy diet such as the Mediterranean diet.  This diet consists of plant based foods, healthy fats, lean meats, olive oil.  It suggests limiting the intake of simple carbohydrates such as white breads, pastries, and pastas.  It also limits the amount of red meat, wine, and dairy products such as cheese that one should consume on a daily basis.   Medication Adjustments/Labs and Tests Ordered: Current medicines are reviewed at length with the patient today.  Concerns regarding medicines are outlined above.  No orders of the defined types were placed in this encounter.  No orders of the defined types were placed in this encounter.   There are no Patient Instructions on file for this visit.    I,Alexis Herring,acting as a scribe for Freada Bergeron, MD.,have documented all relevant documentation on the behalf of Freada Bergeron, MD,as directed by  Freada Bergeron, MD while in the presence of Freada Bergeron, MD.  ***  Signed, Conception Oms Elhorry  08/20/2022 1:52 PM    [facility]

## 2022-09-10 ENCOUNTER — Other Ambulatory Visit (HOSPITAL_COMMUNITY): Payer: Self-pay

## 2022-09-11 ENCOUNTER — Other Ambulatory Visit (HOSPITAL_COMMUNITY): Payer: Self-pay

## 2022-09-25 ENCOUNTER — Other Ambulatory Visit (HOSPITAL_COMMUNITY): Payer: Self-pay

## 2022-10-13 ENCOUNTER — Other Ambulatory Visit (HOSPITAL_COMMUNITY): Payer: Self-pay

## 2022-10-20 ENCOUNTER — Other Ambulatory Visit (HOSPITAL_BASED_OUTPATIENT_CLINIC_OR_DEPARTMENT_OTHER): Payer: Self-pay

## 2022-10-20 ENCOUNTER — Other Ambulatory Visit (HOSPITAL_COMMUNITY): Payer: Self-pay

## 2022-10-21 ENCOUNTER — Other Ambulatory Visit (HOSPITAL_COMMUNITY): Payer: Self-pay

## 2022-10-31 ENCOUNTER — Other Ambulatory Visit (HOSPITAL_COMMUNITY): Payer: Self-pay

## 2022-10-31 ENCOUNTER — Other Ambulatory Visit: Payer: Self-pay

## 2022-10-31 MED ORDER — TOPIRAMATE 200 MG PO TABS
200.0000 mg | ORAL_TABLET | Freq: Two times a day (BID) | ORAL | 3 refills | Status: AC
  Filled 2022-10-31: qty 180, 90d supply, fill #0

## 2022-11-18 ENCOUNTER — Other Ambulatory Visit (HOSPITAL_COMMUNITY): Payer: Self-pay

## 2022-11-20 ENCOUNTER — Other Ambulatory Visit (HOSPITAL_COMMUNITY): Payer: Self-pay

## 2022-11-20 ENCOUNTER — Other Ambulatory Visit: Payer: Self-pay

## 2022-11-20 MED ORDER — VENLAFAXINE HCL ER 75 MG PO CP24
75.0000 mg | ORAL_CAPSULE | Freq: Every day | ORAL | 0 refills | Status: AC
  Filled 2022-11-20 – 2022-11-25 (×2): qty 90, 90d supply, fill #0

## 2022-11-25 ENCOUNTER — Other Ambulatory Visit: Payer: Self-pay

## 2022-11-25 ENCOUNTER — Encounter (HOSPITAL_COMMUNITY): Payer: Self-pay

## 2022-11-25 ENCOUNTER — Other Ambulatory Visit (HOSPITAL_COMMUNITY): Payer: Self-pay

## 2023-02-05 ENCOUNTER — Other Ambulatory Visit (HOSPITAL_COMMUNITY): Payer: Self-pay

## 2023-03-16 ENCOUNTER — Other Ambulatory Visit (HOSPITAL_COMMUNITY): Payer: Self-pay

## 2023-03-30 ENCOUNTER — Other Ambulatory Visit: Payer: Self-pay

## 2023-05-08 ENCOUNTER — Other Ambulatory Visit (HOSPITAL_COMMUNITY): Payer: Self-pay

## 2023-06-05 ENCOUNTER — Other Ambulatory Visit: Payer: Self-pay

## 2023-06-09 ENCOUNTER — Other Ambulatory Visit (HOSPITAL_COMMUNITY): Payer: Self-pay

## 2023-06-19 ENCOUNTER — Other Ambulatory Visit (HOSPITAL_COMMUNITY): Payer: Self-pay | Admitting: Family Medicine

## 2023-06-19 DIAGNOSIS — Z6835 Body mass index (BMI) 35.0-35.9, adult: Secondary | ICD-10-CM

## 2023-06-19 DIAGNOSIS — R7989 Other specified abnormal findings of blood chemistry: Secondary | ICD-10-CM

## 2023-06-25 ENCOUNTER — Other Ambulatory Visit: Payer: Self-pay | Admitting: Physician Assistant

## 2023-06-25 DIAGNOSIS — Z1231 Encounter for screening mammogram for malignant neoplasm of breast: Secondary | ICD-10-CM

## 2023-06-26 ENCOUNTER — Ambulatory Visit (HOSPITAL_COMMUNITY)
Admission: RE | Admit: 2023-06-26 | Discharge: 2023-06-26 | Disposition: A | Discharge status: Home / Self Care | Payer: Medicaid Other | Source: Ambulatory Visit | Attending: Family Medicine | Admitting: Family Medicine

## 2023-06-26 DIAGNOSIS — Z6835 Body mass index (BMI) 35.0-35.9, adult: Secondary | ICD-10-CM | POA: Diagnosis present

## 2023-06-26 DIAGNOSIS — R7989 Other specified abnormal findings of blood chemistry: Secondary | ICD-10-CM | POA: Insufficient documentation

## 2023-07-15 ENCOUNTER — Ambulatory Visit: Payer: Medicaid Other

## 2023-07-15 ENCOUNTER — Other Ambulatory Visit (HOSPITAL_COMMUNITY): Payer: Self-pay

## 2023-07-15 MED ORDER — TOPIRAMATE 25 MG PO TABS
25.0000 mg | ORAL_TABLET | Freq: Two times a day (BID) | ORAL | 0 refills | Status: AC
  Filled 2023-07-15: qty 180, 90d supply, fill #0

## 2023-07-16 ENCOUNTER — Other Ambulatory Visit (HOSPITAL_COMMUNITY): Payer: Self-pay

## 2023-07-30 ENCOUNTER — Other Ambulatory Visit (HOSPITAL_COMMUNITY): Payer: Self-pay

## 2023-07-31 ENCOUNTER — Other Ambulatory Visit (HOSPITAL_COMMUNITY): Payer: Self-pay

## 2023-07-31 MED ORDER — ATORVASTATIN CALCIUM 40 MG PO TABS
40.0000 mg | ORAL_TABLET | Freq: Every day | ORAL | 0 refills | Status: AC
  Filled 2023-07-31: qty 90, 90d supply, fill #0

## 2023-08-01 ENCOUNTER — Other Ambulatory Visit (HOSPITAL_COMMUNITY): Payer: Self-pay

## 2023-08-05 ENCOUNTER — Other Ambulatory Visit (HOSPITAL_COMMUNITY): Payer: Self-pay

## 2023-08-05 ENCOUNTER — Other Ambulatory Visit: Payer: Self-pay

## 2023-08-05 MED ORDER — DULOXETINE HCL 30 MG PO CPEP
30.0000 mg | ORAL_CAPSULE | Freq: Every day | ORAL | 0 refills | Status: DC
  Filled 2023-08-05 – 2023-08-19 (×2): qty 30, 30d supply, fill #0

## 2023-08-05 MED ORDER — ESZOPICLONE 2 MG PO TABS
2.0000 mg | ORAL_TABLET | Freq: Every day | ORAL | 0 refills | Status: AC
  Filled 2023-08-05: qty 15, 15d supply, fill #0

## 2023-08-05 MED ORDER — DULOXETINE HCL 60 MG PO CPEP
60.0000 mg | ORAL_CAPSULE | Freq: Every day | ORAL | 0 refills | Status: DC
  Filled 2023-08-05: qty 30, 30d supply, fill #0

## 2023-08-06 ENCOUNTER — Other Ambulatory Visit: Payer: Self-pay

## 2023-08-06 ENCOUNTER — Other Ambulatory Visit (HOSPITAL_COMMUNITY): Payer: Self-pay

## 2023-08-07 ENCOUNTER — Ambulatory Visit: Payer: Medicaid Other

## 2023-08-19 ENCOUNTER — Other Ambulatory Visit (HOSPITAL_COMMUNITY): Payer: Self-pay

## 2023-08-19 ENCOUNTER — Other Ambulatory Visit: Payer: Self-pay

## 2023-08-19 MED ORDER — ESZOPICLONE 3 MG PO TABS
3.0000 mg | ORAL_TABLET | Freq: Every day | ORAL | 0 refills | Status: AC
  Filled 2023-08-19: qty 15, 15d supply, fill #0

## 2023-08-19 MED ORDER — DULOXETINE HCL 60 MG PO CPEP: 60.0000 mg | ORAL_CAPSULE | Freq: Every day | ORAL | 0 refills | Status: AC

## 2023-08-20 ENCOUNTER — Encounter: Payer: Self-pay | Admitting: Pharmacist

## 2023-08-20 ENCOUNTER — Other Ambulatory Visit: Payer: Self-pay

## 2023-08-20 ENCOUNTER — Other Ambulatory Visit (HOSPITAL_COMMUNITY): Payer: Self-pay

## 2023-08-24 ENCOUNTER — Other Ambulatory Visit (HOSPITAL_COMMUNITY): Payer: Self-pay

## 2023-08-24 ENCOUNTER — Other Ambulatory Visit (HOSPITAL_BASED_OUTPATIENT_CLINIC_OR_DEPARTMENT_OTHER): Payer: Self-pay

## 2023-08-24 ENCOUNTER — Other Ambulatory Visit: Payer: Self-pay

## 2023-08-25 ENCOUNTER — Other Ambulatory Visit: Payer: Self-pay

## 2023-08-25 ENCOUNTER — Other Ambulatory Visit (HOSPITAL_COMMUNITY): Payer: Self-pay

## 2023-08-25 MED ORDER — ESZOPICLONE 3 MG PO TABS
3.0000 mg | ORAL_TABLET | Freq: Every evening | ORAL | 0 refills | Status: AC
  Filled 2023-08-25 (×2): qty 15, 15d supply, fill #0

## 2023-08-28 ENCOUNTER — Other Ambulatory Visit (HOSPITAL_COMMUNITY): Payer: Self-pay

## 2023-09-02 ENCOUNTER — Ambulatory Visit
Admission: RE | Admit: 2023-09-02 | Discharge: 2023-09-02 | Disposition: A | Discharge status: Home / Self Care | Payer: Medicaid Other | Source: Ambulatory Visit | Attending: Physician Assistant | Admitting: Physician Assistant

## 2023-09-02 DIAGNOSIS — Z1231 Encounter for screening mammogram for malignant neoplasm of breast: Secondary | ICD-10-CM

## 2023-09-11 ENCOUNTER — Other Ambulatory Visit: Payer: Self-pay

## 2023-09-11 ENCOUNTER — Other Ambulatory Visit (HOSPITAL_COMMUNITY): Payer: Self-pay

## 2023-09-11 MED ORDER — ATORVASTATIN CALCIUM 80 MG PO TABS
80.0000 mg | ORAL_TABLET | Freq: Every day | ORAL | 3 refills | Status: AC
  Filled 2023-09-11: qty 90, 90d supply, fill #0

## 2023-09-16 ENCOUNTER — Other Ambulatory Visit (HOSPITAL_COMMUNITY): Payer: Self-pay

## 2023-09-16 ENCOUNTER — Encounter (HOSPITAL_COMMUNITY): Payer: Self-pay | Admitting: *Deleted

## 2023-09-16 ENCOUNTER — Other Ambulatory Visit: Payer: Self-pay

## 2023-09-16 MED ORDER — DULOXETINE HCL 60 MG PO CPEP
60.0000 mg | ORAL_CAPSULE | Freq: Every day | ORAL | 0 refills | Status: AC
  Filled 2023-09-16: qty 30, 30d supply, fill #0

## 2023-09-16 MED ORDER — ESZOPICLONE 3 MG PO TABS
3.0000 mg | ORAL_TABLET | Freq: Every day | ORAL | 0 refills | Status: AC
  Filled 2023-09-16: qty 15, 15d supply, fill #0

## 2023-09-16 MED ORDER — DULOXETINE HCL 30 MG PO CPEP
30.0000 mg | ORAL_CAPSULE | Freq: Every day | ORAL | 0 refills | Status: DC
  Filled 2023-09-16: qty 30, 30d supply, fill #0

## 2023-09-16 MED ORDER — ALPRAZOLAM 0.5 MG PO TABS
0.5000 mg | ORAL_TABLET | Freq: Two times a day (BID) | ORAL | 0 refills | Status: DC
  Filled 2023-09-16: qty 60, 30d supply, fill #0

## 2023-10-13 ENCOUNTER — Other Ambulatory Visit (HOSPITAL_COMMUNITY): Payer: Self-pay

## 2023-10-13 ENCOUNTER — Other Ambulatory Visit: Payer: Self-pay

## 2023-10-13 MED ORDER — DULOXETINE HCL 60 MG PO CPEP
60.0000 mg | ORAL_CAPSULE | Freq: Every day | ORAL | 0 refills | Status: AC
  Filled 2023-10-13: qty 30, 30d supply, fill #0

## 2023-10-13 MED ORDER — DULOXETINE HCL 30 MG PO CPEP
30.0000 mg | ORAL_CAPSULE | Freq: Every day | ORAL | 0 refills | Status: AC
  Filled 2023-10-13: qty 30, 30d supply, fill #0

## 2023-10-13 MED ORDER — ALPRAZOLAM 0.5 MG PO TABS
0.5000 mg | ORAL_TABLET | Freq: Two times a day (BID) | ORAL | 0 refills | Status: AC
  Filled 2023-10-14: qty 60, 30d supply, fill #0

## 2023-10-13 MED ORDER — QUETIAPINE FUMARATE 50 MG PO TABS
50.0000 mg | ORAL_TABLET | Freq: Every day | ORAL | 0 refills | Status: AC
  Filled 2023-10-13: qty 30, 30d supply, fill #0

## 2023-10-14 ENCOUNTER — Other Ambulatory Visit: Payer: Self-pay

## 2023-11-02 ENCOUNTER — Encounter: Payer: Self-pay | Admitting: Emergency Medicine

## 2023-11-02 ENCOUNTER — Ambulatory Visit
Admission: EM | Admit: 2023-11-02 | Discharge: 2023-11-02 | Disposition: A | Discharge status: Home / Self Care | Payer: Medicaid Other | Attending: Internal Medicine | Admitting: Internal Medicine

## 2023-11-02 DIAGNOSIS — S46811A Strain of other muscles, fascia and tendons at shoulder and upper arm level, right arm, initial encounter: Secondary | ICD-10-CM | POA: Diagnosis not present

## 2023-11-02 MED ORDER — ACETAMINOPHEN 325 MG PO TABS
975.0000 mg | ORAL_TABLET | Freq: Once | ORAL | Status: AC
  Administered 2023-11-02: 975 mg via ORAL

## 2023-11-02 MED ORDER — BACLOFEN 10 MG PO TABS: 10.0000 mg | ORAL_TABLET | Freq: Three times a day (TID) | ORAL | 0 refills | Status: AC

## 2023-11-02 MED ORDER — KETOROLAC TROMETHAMINE 30 MG/ML IJ SOLN
30.0000 mg | Freq: Once | INTRAMUSCULAR | Status: AC
  Administered 2023-11-02: 30 mg via INTRAMUSCULAR

## 2023-11-02 NOTE — Discharge Instructions (Addendum)
Your pain is likely due to a muscle strain which will improve on its own with time.   - You may take over the counter medicines to help with pain.  - If you were given a shot of pain medicine in the clinic today, you may start taking ibuprofen/other NSAIDs in 12-24 hours. - You may also take the prescribed muscle relaxer as directed as needed for muscle aches/spasm.  Do not take this medication and drive or drink alcohol as it can make you sleepy.  Mainly use this medicine at nighttime as needed. - Apply heat 20 minutes on then 20 minutes off and perform gentle range of motion exercises to the area of greatest pain to prevent muscle stiffness and provide further pain relief.   Red flag symptoms to watch out for are numbness/tingling to the legs, weakness, loss of bowel/bladder control, and/or worsening pain that does not respond well to medicines. Follow-up with your primary care provider or return to urgent care if your symptoms do not improve in the next 3 to 4 days with medications and interventions recommended today. If your symptoms are severe (red flag), please go to the emergency room.

## 2023-11-02 NOTE — ED Provider Notes (Addendum)
[name]    CSN: [id number] Arrival date & time: 11/02/23  1032      History   Chief Complaint Chief Complaint  Patient presents with   Neck Pain    HPI Tanya [name] is a 43 y.o. female.   Tanya [name] is a 43 y.o. female presenting for chief complaint of Neck Pain that started 2 weeks ago. Neck pain initially started to the right side of the neck and has become generalized to both sides of the neck over time. Pain is triggered by movement and relieved slightly with heat and rest.  States she has been sleeping with a heating pad and this has helped at bedtime with pain.  Pain is described as a tightness sensation and is currently a 10 on a scale of 0-10.  Denies radicular pain to the back and legs.  Denies recent trauma/injuries to the neck or history of similar illness.  No recent body aches, cough, nasal congestion, dizziness, chest pain, shortness of breath, or heart palpitations.  No rash.  History of gastric bypass and therefore cannot take oral NSAIDs.  She has not attempted use of Tylenol to help with symptoms PTA.    Neck Pain   Past Medical History:  Diagnosis Date   Anemia    Anxiety    Arthritis    L knee OA   Depression    history of depression in 2010-12-01 with death of mother   Hyperlipidemia    Hypertension    Migraine    Obesity 02/2017   has had gastric sleeve    Patient Active Problem List   Diagnosis Date Noted   Generalized anxiety disorder 05/12/2019   Insomnia due to psychological stress 05/12/2019   Gastroesophageal reflux disease without esophagitis 05/12/2019   Volume depletion 01/19/2019   Menorrhagia 03/25/2018   Nausea without vomiting 03/09/2018   LUQ abdominal pain 03/09/2018   Bowel habit changes 12/08/2017   Bloating 12/08/2017   Abdominal pain 09/04/2017   Anal fissure 09/04/2017   Constipation 09/04/2017   S/P laparoscopic sleeve gastrectomy May 2018 02/17/2017   Iron deficiency anemia due to chronic  blood loss 02/01/2017   SVT (supraventricular tachycardia) (HCC)    Anemia due to vitamin B12 deficiency 12/08/2016   Menometrorrhagia 12/05/2016   Morbid obesity (HCC) 12/05/2016   Chronic migraine without aura without status migrainosus, not intractable 12/05/2016    Past Surgical History:  Procedure Laterality Date   ABDOMINAL HYSTERECTOMY     ANAL RECTAL MANOMETRY N/A 01/11/2018   Procedure: ANO RECTAL MANOMETRY;  Surgeon: Napoleon Form, MD;  Location: [facility];  Service: Endoscopy;  Laterality: N/A;   CARPAL TUNNEL RELEASE Bilateral    CESAREAN SECTION     x 1   CHOLECYSTECTOMY     KNEE SURGERY Left    LAPAROSCOPIC GASTRIC SLEEVE RESECTION N/A 02/17/2017   Procedure: LAPAROSCOPIC GASTRIC SLEEVE RESECTION, UPPER ENDO;  Surgeon: Luretha Murphy, MD;  Location: [facility];  Service: General;  Laterality: N/A;   LAPAROSCOPIC UNILATERAL SALPINGECTOMY Left 06/10/2014   Procedure: LAPAROSCOPIC Left SALPINGECTOMY, removal of ectopic pregnancy;  Surgeon: Lesly Dukes, MD;  Location: [facility];  Service: Gynecology;  Laterality: Left;   TUBAL LIGATION     VAGINAL HYSTERECTOMY Bilateral 03/25/2018   Procedure: HYSTERECTOMY VAGINAL, RIGHT SALPINGECTOMY;  Surgeon: Hal Morales, MD;  Location: [facility];  Service: Gynecology;  Laterality: Bilateral;  ERAS   WISDOM TOOTH EXTRACTION      OB History  Gravida  3   Para  2   Term  1   Preterm      AB      Living  2      SAB      IAB      Ectopic      Multiple      Live Births  2            Home Medications    Prior to Admission medications   Medication Sig Start Date End Date Taking? Authorizing Provider  baclofen (LIORESAL) 10 MG tablet Take 1 tablet (10 mg total) by mouth 3 (three) times daily. 11/02/23  Yes Carlisle Beers, FNP  ALPRAZolam Prudy Feeler) 0.5 MG tablet Take 1 tablet (0.5 mg total) by mouth 2 (two) times daily. 10/13/23     atorvastatin (LIPITOR) 40 MG tablet Take one tablet  (40 mg dose) by mouth daily. 07/31/23     atorvastatin (LIPITOR) 80 MG tablet Take 1 tablet (80 mg total) by mouth daily. 09/11/23     DULoxetine (CYMBALTA) 30 MG capsule Take 1 capsule (30 mg total) by mouth at bedtime. 10/13/23     DULoxetine (CYMBALTA) 60 MG capsule Take 1 capsule (60 mg total) by mouth at bedtime. 08/19/23     DULoxetine (CYMBALTA) 60 MG capsule Take 1 capsule (60 mg total) by mouth at bedtime. 09/16/23     DULoxetine (CYMBALTA) 60 MG capsule Take 1 capsule (60 mg total) by mouth at bedtime. 10/13/23     eszopiclone (LUNESTA) 2 MG TABS tablet Take 1 tablet (2 mg total) by mouth at bedtime. Patient not taking: Reported on 11/02/2023 08/05/23     Eszopiclone 3 MG TABS Take 1 tablet (3 mg total) by mouth at bedtime. 08/19/23     Eszopiclone 3 MG TABS Take 1 tablet (3 mg total) by mouth at bedtime. 08/25/23     Eszopiclone 3 MG TABS Take 1 tablet (3 mg total) by mouth at bedtime. 09/16/23     metoprolol succinate (TOPROL XL) 25 MG 24 hr tablet Take 0.5 tablets (12.5 mg total) by mouth as needed. 07/17/22   Gaston Islam., NP  pantoprazole (PROTONIX) 40 MG tablet Take 40 mg by mouth daily.    [provider]  pyridOXINE (B-6) 50 MG tablet Take 50 mg by mouth daily.    [provider]  QUEtiapine (SEROQUEL) 50 MG tablet Take 1 tablet (50 mg total) by mouth at bedtime. Patient not taking: Reported on 11/02/2023 10/13/23     topiramate (TOPAMAX) 100 MG tablet Take 2.5 tablets (250 mg total) by mouth 2 (two) times daily. 05/12/19 05/30/21  Noni Saupe, MD  topiramate (TOPAMAX) 200 MG tablet Take 1 tablet (200 mg total) by mouth 2 (two) times daily. Patient not taking: Reported on 11/02/2023 10/31/22     topiramate (TOPAMAX) 25 MG tablet Take 1 tablet (25 mg total) by mouth 2 (two) times daily. Patient not taking: Reported on 11/02/2023 07/15/23     traMADol (ULTRAM) 50 MG tablet Take 1 tablet by mouth every 8 (eight) hours as needed. Patient not taking: Reported on  11/02/2023    [provider]  venlafaxine XR (EFFEXOR-XR) 37.5 MG 24 hr capsule Take 1 capsule (37.5 mg total) by mouth daily. Patient not taking: Reported on 11/02/2023 07/24/22     venlafaxine XR (EFFEXOR-XR) 75 MG 24 hr capsule Take 1 capsule (75 mg total) by mouth daily. Patient not taking: Reported on 11/02/2023 11/19/22  Family History Family History  Problem Relation Age of Onset   Breast cancer Mother 61   Cancer Mother    Prostate cancer Father 35   Colon polyps Father    Breast cancer Sister 34   Breast cancer Maternal Aunt        dx in her 44s; BRCA neg   Prostate cancer Paternal Uncle    Diabetes Paternal Uncle    Heart disease Maternal Grandmother    Diabetes Paternal Grandmother    Prostate cancer Paternal Grandfather    Ovarian cancer Sister 58       found during her pregnancy   Prostate cancer Paternal Uncle    Prostate cancer Paternal Uncle    Cancer Other    Stroke Other    Diabetes Other    Coronary artery disease Other    Colon cancer Neg Hx    Esophageal cancer Neg Hx    Rectal cancer Neg Hx     Social History Social History   Tobacco Use   Smoking status: Never   Smokeless tobacco: Never  Vaping Use   Vaping status: Never Used  Substance Use Topics   Alcohol use: Yes    Comment: socially   Drug use: No     Allergies   Imitrex [sumatriptan], Zolmitriptan, Elemental sulfur, Latex, Sulfa antibiotics, Aspirin, Nsaids, and Ibuprofen   Review of Systems Review of Systems  Musculoskeletal:  Positive for neck pain.  Per HPI   Physical Exam Triage Vital Signs ED Triage Vitals  Encounter Vitals Group     BP 11/02/23 1057 123/81     Systolic BP Percentile --      Diastolic BP Percentile --      Pulse Rate 11/02/23 1057 70     Resp 11/02/23 1057 16     Temp 11/02/23 1057 97.8 F (36.6 C)     Temp Source 11/02/23 1057 Oral     SpO2 11/02/23 1057 98 %     Weight --      Height --      Head Circumference --      Peak Flow  --      Pain Score 11/02/23 1058 10     Pain Loc --      Pain Education --      Exclude from Growth Chart --    No data found.  Updated Vital Signs BP 123/81 (BP Location: Right Arm)   Pulse 70   Temp 97.8 F (36.6 C) (Oral)   Resp 16   LMP 03/12/2018 (Exact Date)   SpO2 98%   Visual Acuity Right Eye Distance:   Left Eye Distance:   Bilateral Distance:    Right Eye Near:   Left Eye Near:    Bilateral Near:     Physical Exam Vitals and nursing note reviewed.  Constitutional:      Appearance: She is not ill-appearing or toxic-appearing.  HENT:     Head: Normocephalic and atraumatic.     Right Ear: Hearing and external ear normal.     Left Ear: Hearing and external ear normal.     Nose: Nose normal.     Mouth/Throat:     Lips: Pink.  Eyes:     General: Lids are normal. Vision grossly intact. Gaze aligned appropriately.     Extraocular Movements: Extraocular movements intact.     Conjunctiva/sclera: Conjunctivae normal.  Neck:     Trachea: Trachea and phonation normal.  Cardiovascular:     Rate  and Rhythm: Normal rate and regular rhythm.     Heart sounds: Normal heart sounds, S1 normal and S2 normal.  Pulmonary:     Effort: Pulmonary effort is normal. No respiratory distress.     Breath sounds: Normal breath sounds and air entry. No wheezing, rhonchi or rales.  Chest:     Chest wall: No tenderness.  Musculoskeletal:     Cervical back: Neck supple. No edema, erythema, signs of trauma, rigidity, torticollis or crepitus. Pain with movement and muscular tenderness (Tender to multiple points of palpation over the right and left trapezius muscles) present. No spinous process tenderness. Decreased range of motion (Secondary to pain).  Lymphadenopathy:     Cervical: No cervical adenopathy.  Skin:    General: Skin is warm and dry.     Capillary Refill: Capillary refill takes less than 2 seconds.     Findings: No rash.  Neurological:     General: No focal deficit  present.     Mental Status: She is alert and oriented to person, place, and time. Mental status is at baseline.     Cranial Nerves: No dysarthria or facial asymmetry.  Psychiatric:        Mood and Affect: Mood normal.        Speech: Speech normal.        Behavior: Behavior normal.        Thought Content: Thought content normal.        Judgment: Judgment normal.      UC Treatments / Results  Labs (all labs ordered are listed, but only abnormal results are displayed) Labs Reviewed - No data to display  EKG   Radiology No results found.  Procedures Procedures (including critical care time)  Medications Ordered in UC Medications  ketorolac (TORADOL) 30 MG/ML injection 30 mg (has no administration in time range)  acetaminophen (TYLENOL) tablet 975 mg (975 mg Oral Given 11/02/23 1139)    Initial Impression / Assessment and Plan / UC Course  I have reviewed the triage vital signs and the nursing notes.  Pertinent labs & imaging results that were available during my care of the patient were reviewed by me and considered in my medical decision making (see chart for details).   1.  Trapezius muscle strain, right Evaluation suggests pain is musculoskeletal in nature. Will manage this with rest, gentle ROM exercises, heat therapy, tylenol as needed for pain, and as needed use of muscle relaxer. Drowsiness precautions discussed regarding muscle relaxer use. She is a candidate for IM NSAID, therefore ketorolac 30mg  IM given in clinic.  Imaging: no indication for imaging based on stable musculoskeletal exam findings May follow-up with orthopedics as needed. Work/school excise note given.  Counseled patient on potential for adverse effects with medications prescribed/recommended today, strict ER and return-to-clinic precautions discussed, patient verbalized understanding.    Final Clinical Impressions(s) / UC Diagnoses   Final diagnoses:  Trapezius muscle strain, right, initial  encounter     Discharge Instructions      Your pain is likely due to a muscle strain which will improve on its own with time.   - You may take over the counter medicines to help with pain.  - If you were given a shot of pain medicine in the clinic today, you may start taking ibuprofen/other NSAIDs in 12-24 hours. - You may also take the prescribed muscle relaxer as directed as needed for muscle aches/spasm.  Do not take this medication and drive or drink alcohol as  it can make you sleepy.  Mainly use this medicine at nighttime as needed. - Apply heat 20 minutes on then 20 minutes off and perform gentle range of motion exercises to the area of greatest pain to prevent muscle stiffness and provide further pain relief.   Red flag symptoms to watch out for are numbness/tingling to the legs, weakness, loss of bowel/bladder control, and/or worsening pain that does not respond well to medicines. Follow-up with your primary care provider or return to urgent care if your symptoms do not improve in the next 3 to 4 days with medications and interventions recommended today. If your symptoms are severe (red flag), please go to the emergency room.       ED Prescriptions     Medication Sig Dispense Auth. Provider   baclofen (LIORESAL) 10 MG tablet Take 1 tablet (10 mg total) by mouth 3 (three) times daily. 30 each Carlisle Beers, FNP      PDMP not reviewed this encounter.   Carlisle Beers, FNP 11/02/23 1140    Reita May [place], [place] 11/02/23 1140

## 2023-11-02 NOTE — ED Triage Notes (Addendum)
Pt c/o neck pain for 2 weeks. States she began not feeling well and feeling off balance the last few days.

## 2023-11-03 ENCOUNTER — Other Ambulatory Visit (HOSPITAL_BASED_OUTPATIENT_CLINIC_OR_DEPARTMENT_OTHER): Payer: Self-pay

## 2023-11-03 ENCOUNTER — Other Ambulatory Visit (HOSPITAL_COMMUNITY): Payer: Self-pay

## 2023-11-03 MED ORDER — ALPRAZOLAM 0.5 MG PO TABS
0.5000 mg | ORAL_TABLET | Freq: Two times a day (BID) | ORAL | 0 refills | Status: AC
  Filled 2023-12-02: qty 60, 30d supply, fill #0

## 2023-11-03 MED ORDER — TRINTELLIX 10 MG PO TABS
10.0000 mg | ORAL_TABLET | Freq: Every day | ORAL | 0 refills | Status: DC
  Filled 2023-11-03: qty 30, 30d supply, fill #0

## 2023-11-03 MED ORDER — WEGOVY 0.25 MG/0.5ML ~~LOC~~ SOAJ
SUBCUTANEOUS | 0 refills | Status: AC
  Filled 2023-11-03: qty 2, 28d supply, fill #0

## 2023-11-04 ENCOUNTER — Other Ambulatory Visit (HOSPITAL_COMMUNITY): Payer: Self-pay

## 2023-11-05 ENCOUNTER — Other Ambulatory Visit (HOSPITAL_COMMUNITY): Payer: Self-pay

## 2023-11-11 ENCOUNTER — Other Ambulatory Visit: Payer: Self-pay

## 2023-11-11 ENCOUNTER — Other Ambulatory Visit (HOSPITAL_COMMUNITY): Payer: Self-pay

## 2023-11-30 ENCOUNTER — Other Ambulatory Visit (HOSPITAL_COMMUNITY): Payer: Self-pay

## 2023-12-01 ENCOUNTER — Other Ambulatory Visit (HOSPITAL_COMMUNITY): Payer: Self-pay

## 2023-12-01 ENCOUNTER — Other Ambulatory Visit: Payer: Self-pay

## 2023-12-01 MED ORDER — WEGOVY 0.5 MG/0.5ML ~~LOC~~ SOAJ
0.5000 mg | SUBCUTANEOUS | 0 refills | Status: AC
  Filled 2023-12-01 – 2023-12-04 (×4): qty 2, 28d supply, fill #0

## 2023-12-02 ENCOUNTER — Other Ambulatory Visit: Payer: Self-pay

## 2023-12-02 ENCOUNTER — Other Ambulatory Visit (HOSPITAL_COMMUNITY): Payer: Self-pay

## 2023-12-03 ENCOUNTER — Other Ambulatory Visit: Payer: Self-pay

## 2023-12-03 ENCOUNTER — Other Ambulatory Visit (HOSPITAL_COMMUNITY): Payer: Self-pay

## 2023-12-03 MED ORDER — TRINTELLIX 10 MG PO TABS
10.0000 mg | ORAL_TABLET | Freq: Every day | ORAL | 0 refills | Status: AC
  Filled 2023-12-03: qty 5, 5d supply, fill #0

## 2023-12-04 ENCOUNTER — Other Ambulatory Visit (HOSPITAL_COMMUNITY): Payer: Self-pay

## 2023-12-08 ENCOUNTER — Other Ambulatory Visit (HOSPITAL_COMMUNITY): Payer: Self-pay

## 2023-12-08 MED ORDER — TRINTELLIX 20 MG PO TABS
20.0000 mg | ORAL_TABLET | Freq: Every day | ORAL | 0 refills | Status: DC
  Filled 2023-12-08: qty 30, 30d supply, fill #0

## 2023-12-08 MED ORDER — ALPRAZOLAM ER 1 MG PO TB24
1.0000 mg | ORAL_TABLET | Freq: Every day | ORAL | 0 refills | Status: AC
  Filled 2023-12-08: qty 30, 30d supply, fill #0

## 2023-12-09 ENCOUNTER — Other Ambulatory Visit: Payer: Self-pay

## 2023-12-17 ENCOUNTER — Emergency Department (HOSPITAL_COMMUNITY)

## 2023-12-17 ENCOUNTER — Other Ambulatory Visit: Payer: Self-pay

## 2023-12-17 ENCOUNTER — Emergency Department (HOSPITAL_COMMUNITY)
Admission: EM | Admit: 2023-12-17 | Discharge: 2023-12-17 | Disposition: A | Discharge status: Home / Self Care | Attending: Emergency Medicine | Admitting: Emergency Medicine

## 2023-12-17 ENCOUNTER — Encounter (HOSPITAL_COMMUNITY): Payer: Self-pay

## 2023-12-17 DIAGNOSIS — F419 Anxiety disorder, unspecified: Secondary | ICD-10-CM | POA: Insufficient documentation

## 2023-12-17 DIAGNOSIS — Z79899 Other long term (current) drug therapy: Secondary | ICD-10-CM | POA: Insufficient documentation

## 2023-12-17 DIAGNOSIS — Z91040 Latex allergy status: Secondary | ICD-10-CM | POA: Insufficient documentation

## 2023-12-17 DIAGNOSIS — R531 Weakness: Secondary | ICD-10-CM | POA: Diagnosis not present

## 2023-12-17 DIAGNOSIS — G43109 Migraine with aura, not intractable, without status migrainosus: Secondary | ICD-10-CM

## 2023-12-17 DIAGNOSIS — R519 Headache, unspecified: Secondary | ICD-10-CM | POA: Diagnosis present

## 2023-12-17 DIAGNOSIS — G43809 Other migraine, not intractable, without status migrainosus: Secondary | ICD-10-CM | POA: Insufficient documentation

## 2023-12-17 DIAGNOSIS — R000 Tachycardia, unspecified: Secondary | ICD-10-CM | POA: Insufficient documentation

## 2023-12-17 LAB — CBC
HCT: 44.2 % (ref 36.0–46.0)
Hemoglobin: 15 g/dL (ref 12.0–15.0)
MCH: 27.9 pg (ref 26.0–34.0)
MCHC: 33.9 g/dL (ref 30.0–36.0)
MCV: 82.3 fL (ref 80.0–100.0)
Platelets: 372 10*3/uL (ref 150–400)
RBC: 5.37 MIL/uL — ABNORMAL HIGH (ref 3.87–5.11)
RDW: 12.3 % (ref 11.5–15.5)
WBC: 7.5 10*3/uL (ref 4.0–10.5)
nRBC: 0 % (ref 0.0–0.2)

## 2023-12-17 LAB — COMPREHENSIVE METABOLIC PANEL
ALT: 38 U/L (ref 0–44)
AST: 33 U/L (ref 15–41)
Albumin: 3.9 g/dL (ref 3.5–5.0)
Alkaline Phosphatase: 53 U/L (ref 38–126)
Anion gap: 13 (ref 5–15)
BUN: 13 mg/dL (ref 6–20)
CO2: 20 mmol/L — ABNORMAL LOW (ref 22–32)
Calcium: 9.5 mg/dL (ref 8.9–10.3)
Chloride: 107 mmol/L (ref 98–111)
Creatinine, Ser: 0.79 mg/dL (ref 0.44–1.00)
GFR, Estimated: >60 mL/min (ref >60)
Glucose, Bld: 114 mg/dL — ABNORMAL HIGH (ref 70–99)
Potassium: 3.9 mmol/L (ref 3.5–5.1)
Sodium: 140 mmol/L (ref 135–145)
Total Bilirubin: 0.6 mg/dL (ref 0.0–1.2)
Total Protein: 6.8 g/dL (ref 6.5–8.1)

## 2023-12-17 LAB — I-STAT CHEM 8, ED
BUN: 14 mg/dL (ref 6–20)
Calcium, Ion: 1.05 mmol/L — ABNORMAL LOW (ref 1.15–1.40)
Chloride: 109 mmol/L (ref 98–111)
Creatinine, Ser: 0.6 mg/dL (ref 0.44–1.00)
Glucose, Bld: 107 mg/dL — ABNORMAL HIGH (ref 70–99)
HCT: 44 % (ref 36.0–46.0)
Hemoglobin: 15 g/dL (ref 12.0–15.0)
Potassium: 4 mmol/L (ref 3.5–5.1)
Sodium: 140 mmol/L (ref 135–145)
TCO2: 20 mmol/L — ABNORMAL LOW (ref 22–32)

## 2023-12-17 LAB — DIFFERENTIAL
Abs Immature Granulocytes: 0.01 10*3/uL (ref 0.00–0.07)
Basophils Absolute: 0.1 10*3/uL (ref 0.0–0.1)
Basophils Relative: 1 %
Eosinophils Absolute: 0.1 10*3/uL (ref 0.0–0.5)
Eosinophils Relative: 1 %
Immature Granulocytes: 0 %
Lymphocytes Relative: 32 %
Lymphs Abs: 2.4 10*3/uL (ref 0.7–4.0)
Monocytes Absolute: 0.8 10*3/uL (ref 0.1–1.0)
Monocytes Relative: 11 %
Neutro Abs: 4.2 10*3/uL (ref 1.7–7.7)
Neutrophils Relative %: 55 %

## 2023-12-17 LAB — APTT: aPTT: 25 s (ref 24–36)

## 2023-12-17 LAB — PROTIME-INR
INR: 1.1 (ref 0.8–1.2)
Prothrombin Time: 13.9 s (ref 11.4–15.2)

## 2023-12-17 LAB — ETHANOL: Alcohol, Ethyl (B): <10 mg/dL (ref <10)

## 2023-12-17 LAB — CBG MONITORING, ED: Glucose-Capillary: 113 mg/dL — ABNORMAL HIGH (ref 70–99)

## 2023-12-17 MED ORDER — SODIUM CHLORIDE 0.9% FLUSH
3.0000 mL | Freq: Once | INTRAVENOUS | Status: AC
  Administered 2023-12-17: 3 mL via INTRAVENOUS

## 2023-12-17 MED ORDER — PROCHLORPERAZINE EDISYLATE 10 MG/2ML IJ SOLN
10.0000 mg | Freq: Once | INTRAMUSCULAR | Status: AC
  Administered 2023-12-17: 10 mg via INTRAVENOUS
  Filled 2023-12-17: qty 2

## 2023-12-17 NOTE — ED Notes (Signed)
Help get patient into a gown on the monitor did EKG shown to er provider patient is resting with call bell in reach  

## 2023-12-17 NOTE — ED Triage Notes (Signed)
 PT BIB [facility] after having complaint of weakness, L shoulder pain, headache and dizziness en route to work. PT states headache began around 0600 when waking up, and L shoulder pain at 0700 and pulled into a parking lot at 0820 with onset of L sided weakness and parasthesia. EMS noted LS facial droop, LS weakness with HR 80, BP 125/78. PT aox4, Hx of V-tach episodes, along with anxiety and insomnia, treats with xanax.

## 2023-12-17 NOTE — Code Documentation (Signed)
 Stroke Response Nurse Documentation Code Documentation  Tanya [name] is a 43 y.o. female arriving to [facility]  via [facility] on 12/17/2023 with past medical hx of migraines, SVT, GERD. Code stroke was activated by .   Patient was on her way to work when she had a sudden onset of left sided shoulder pain and weakness. She pulled over and then stated her whole left sided went numb and felt tingling. When she woke up this morning, she had a headache, but not signs of weakness or sensory deficits.   Stroke team at the bedside on patient arrival. Labs drawn and patient cleared for CT by Dr. Rubin Payor. Patient to CT with team. NIHSS 3, see documentation for details and code stroke times. Patient with left arm weakness, left leg weakness, and left decreased sensation on exam. The following imaging was completed:  CT Head. Patient is not a candidate for IV Thrombolytic due to too mild to treat. Patient is not a candidate for IR due to LVO not suspected.   Care Plan: q30 NIHSS/VS until 1200, then q2. Pt to have MRI and given mirgaine cocktail to see if symptoms resolve.   IF PATIENT WORSENS AT ANYTIME PRIOR TO 1200, please reactivate a Code Stroke.    Bedside handoff with ED RN [place].    Lucila Maine  Stroke Response RN

## 2023-12-17 NOTE — Consult Note (Signed)
 NEUROLOGY CONSULT NOTE   Date of service: December 17, 2023 Patient Name: Tanya [name] MRN:  [id number] DOB:  [birth year] Chief Complaint: "code stroke -" Requesting Provider: Benjiman Core, MD  History of Present Illness  Tanya [name] is a 43 y.o. female with hx of anxiety and depression, HTN, HLD, migraine, SVT who presents to [facility] ED via EMS for acute onset of left side numbness and tingling, left side weakness. Per EMS original call was for increased HR however en route she developed, left arm pain, left side numbness And left side weakness and Code stroke was activated. HR on arrival with EMS was 130's but decreased to 80's. Patient states she woke with a migraine Headache and was able to shower with no problem, had no numbness or weakness and while driving to work she developed symptoms. LKW 0730 On exam at the bridge she appears very anxious, alert and oriented x 4, no facial droop, no dysarthria or slurred speech,no visual problems right arm 5/5, left arm 4/5 with drift, left leg 4/5 with drift, and decreased sensation on left arm and leg. No ataxia noted  CT head with no acute process.   LKW: 0730 Modified rankin score: 0-Completely asymptomatic and back to baseline post- stroke IV Thrombolysis:  No likely not a stroke, low NIHSS  EVT:  No LVO   NIHSS components Score: Comment  1a Level of Conscious 0[x]  1[]  2[]  3[]      1b LOC Questions 0[x]  1[]  2[]       1c LOC Commands 0[x]  1[]  2[]       2 Best Gaze 0[x]  1[]  2[]       3 Visual 0[x]  1[]  2[]  3[]      4 Facial Palsy 0[x]  1[]  2[]  3[]      5a Motor Arm - left 0[]  1[x]  2[]  3[]  4[]  UN[]    5b Motor Arm - Right 0[x]  1[]  2[]  3[]  4[]  UN[]    6a Motor Leg - Left 0[]  1[x]  2[]  3[]  4[]  UN[]    6b Motor Leg - Right 0[x]  1[]  2[]  3[]  4[]  UN[]    7 [name] Ataxia 0[x]  1[]  2[]  3[]  UN[]     8 Sensory 0[]  1[x]  2[]  UN[]      9 Best Language 0[x]  1[]  2[]  3[]      10 Dysarthria 0[x]  1[]  2[]  UN[]      11 Extinct. and Inattention 0[x]  1[]  2[]        TOTAL: 3      ROS   Comprehensive ROS performed and pertinent positives documented in HPI    Past History   Past Medical History:  Diagnosis Date   Anemia    Anxiety    Arthritis    L knee OA   Depression    history of depression in 2011-01-01 with death of mother   Hyperlipidemia    Hypertension    Migraine    Obesity 02/2017   has had gastric sleeve    Past Surgical History:  Procedure Laterality Date   ABDOMINAL HYSTERECTOMY     ANAL RECTAL MANOMETRY N/A 01/11/2018   Procedure: ANO RECTAL MANOMETRY;  Surgeon: Napoleon Form, MD;  Location: [facility];  Service: Endoscopy;  Laterality: N/A;   CARPAL TUNNEL RELEASE Bilateral    CESAREAN SECTION     x 1   CHOLECYSTECTOMY     KNEE SURGERY Left    LAPAROSCOPIC GASTRIC SLEEVE RESECTION N/A 02/17/2017   Procedure: LAPAROSCOPIC GASTRIC SLEEVE RESECTION, UPPER ENDO;  Surgeon: Luretha Murphy, MD;  Location: [facility];  Service: General;  Laterality:  N/A;   LAPAROSCOPIC UNILATERAL SALPINGECTOMY Left 06/10/2014   Procedure: LAPAROSCOPIC Left SALPINGECTOMY, removal of ectopic pregnancy;  Surgeon: Lesly Dukes, MD;  Location: [facility];  Service: Gynecology;  Laterality: Left;   TUBAL LIGATION     VAGINAL HYSTERECTOMY Bilateral 03/25/2018   Procedure: HYSTERECTOMY VAGINAL, RIGHT SALPINGECTOMY;  Surgeon: Hal Morales, MD;  Location: [facility];  Service: Gynecology;  Laterality: Bilateral;  ERAS   WISDOM TOOTH EXTRACTION      Family History: Family History  Problem Relation Age of Onset   Breast cancer Mother 65   Cancer Mother    Prostate cancer Father 56   Colon polyps Father    Breast cancer Sister 9   Breast cancer Maternal Aunt        dx in her 68s; BRCA neg   Prostate cancer Paternal Uncle    Diabetes Paternal Uncle    Heart disease Maternal Grandmother    Diabetes Paternal Grandmother    Prostate cancer Paternal Grandfather    Ovarian cancer Sister 62       found during her pregnancy    Prostate cancer Paternal Uncle    Prostate cancer Paternal Uncle    Cancer Other    Stroke Other    Diabetes Other    Coronary artery disease Other    Colon cancer Neg Hx    Esophageal cancer Neg Hx    Rectal cancer Neg Hx     Social History  reports that she has never smoked. She has never used smokeless tobacco. She reports current alcohol use. She reports that she does not use drugs.  Allergies  Allergen Reactions   Imitrex [Sumatriptan] Shortness Of Breath   Zolmitriptan Shortness Of Breath and Swelling   Elemental Sulfur Rash    Pt states that her mother told her that she was allergic   Latex Hives and Swelling   Sulfa Antibiotics Other (See Comments) and Rash    Unknown. Since childhood.  DUE TO SURGERY    Aspirin Other (See Comments)    "cant take because I had bariatric surgery"   Nsaids     DUE TO BARIATRIC SURGERY   Ibuprofen Other (See Comments)    Can not take because of surgery (No NSAIDS)    Medications   Current Facility-Administered Medications:    sodium chloride flush (NS) 0.9 % injection 3 mL, 3 mL, Intravenous, Once, Benjiman Core, MD  Current Outpatient Medications:    ALPRAZolam (XANAX XR) 1 MG 24 hr tablet, Take 1 tablet (1 mg total) by mouth at bedtime., Disp: 30 tablet, Rfl: 0   ALPRAZolam (XANAX) 0.5 MG tablet, Take 1 tablet (0.5 mg total) by mouth 2 (two) times daily., Disp: 60 tablet, Rfl: 0   ALPRAZolam (XANAX) 0.5 MG tablet, Take 1 tablet (0.5 mg total) by mouth 2 (two) times daily., Disp: 60 tablet, Rfl: 0   atorvastatin (LIPITOR) 40 MG tablet, Take one tablet (40 mg dose) by mouth daily., Disp: 90 tablet, Rfl: 0   atorvastatin (LIPITOR) 80 MG tablet, Take 1 tablet (80 mg total) by mouth daily., Disp: 90 tablet, Rfl: 3   baclofen (LIORESAL) 10 MG tablet, Take 1 tablet (10 mg total) by mouth 3 (three) times daily., Disp: 30 each, Rfl: 0   DULoxetine (CYMBALTA) 30 MG capsule, Take 1 capsule (30 mg total) by mouth at bedtime., Disp: 30  capsule, Rfl: 0   DULoxetine (CYMBALTA) 60 MG capsule, Take 1 capsule (60 mg total) by mouth at bedtime.,  Disp: 30 capsule, Rfl: 0   DULoxetine (CYMBALTA) 60 MG capsule, Take 1 capsule (60 mg total) by mouth at bedtime., Disp: 30 capsule, Rfl: 0   DULoxetine (CYMBALTA) 60 MG capsule, Take 1 capsule (60 mg total) by mouth at bedtime., Disp: 30 capsule, Rfl: 0   eszopiclone (LUNESTA) 2 MG TABS tablet, Take 1 tablet (2 mg total) by mouth at bedtime. (Patient not taking: Reported on 11/02/2023), Disp: 15 tablet, Rfl: 0   Eszopiclone 3 MG TABS, Take 1 tablet (3 mg total) by mouth at bedtime., Disp: 30 tablet, Rfl: 0   Eszopiclone 3 MG TABS, Take 1 tablet (3 mg total) by mouth at bedtime., Disp: 30 tablet, Rfl: 0   Eszopiclone 3 MG TABS, Take 1 tablet (3 mg total) by mouth at bedtime., Disp: 30 tablet, Rfl: 0   metoprolol succinate (TOPROL XL) 25 MG 24 hr tablet, Take 0.5 tablets (12.5 mg total) by mouth as needed., Disp: 30 tablet, Rfl: 3   pantoprazole (PROTONIX) 40 MG tablet, Take 40 mg by mouth daily., Disp: , Rfl:    pyridOXINE (B-6) 50 MG tablet, Take 50 mg by mouth daily., Disp: , Rfl:    QUEtiapine (SEROQUEL) 50 MG tablet, Take 1 tablet (50 mg total) by mouth at bedtime. (Patient not taking: Reported on 11/02/2023), Disp: 30 tablet, Rfl: 0   Semaglutide-Weight Management (WEGOVY) 0.25 MG/0.5ML SOAJ, Inject 0.25 mg under the skin every seven (7) days., Disp: 2 mL, Rfl: 0   Semaglutide-Weight Management (WEGOVY) 0.5 MG/0.5ML SOAJ, Inject 0.5 mg into the skin every 7 (seven) days., Disp: 2 mL, Rfl: 0   topiramate (TOPAMAX) 100 MG tablet, Take 2.5 tablets (250 mg total) by mouth 2 (two) times daily., Disp: 150 tablet, Rfl: 2   topiramate (TOPAMAX) 200 MG tablet, Take 1 tablet (200 mg total) by mouth 2 (two) times daily. (Patient not taking: Reported on 11/02/2023), Disp: 180 tablet, Rfl: 3   topiramate (TOPAMAX) 25 MG tablet, Take 1 tablet (25 mg total) by mouth 2 (two) times daily. (Patient not taking:  Reported on 11/02/2023), Disp: 180 tablet, Rfl: 0   traMADol (ULTRAM) 50 MG tablet, Take 1 tablet by mouth every 8 (eight) hours as needed. (Patient not taking: Reported on 11/02/2023), Disp: , Rfl:    venlafaxine XR (EFFEXOR-XR) 37.5 MG 24 hr capsule, Take 1 capsule (37.5 mg total) by mouth daily. (Patient not taking: Reported on 11/02/2023), Disp: 30 capsule, Rfl: 2   venlafaxine XR (EFFEXOR-XR) 75 MG 24 hr capsule, Take 1 capsule (75 mg total) by mouth daily. (Patient not taking: Reported on 11/02/2023), Disp: 90 capsule, Rfl: 0   vortioxetine HBr (TRINTELLIX) 10 MG TABS tablet, Take 1 tablet (10 mg total) by mouth daily., Disp: 5 tablet, Rfl: 0   vortioxetine HBr (TRINTELLIX) 20 MG TABS tablet, Take 1 tablet (20 mg total) by mouth daily., Disp: 30 tablet, Rfl: 0  Vitals   Vitals:   12/17/23 0900  Weight: 86.8 kg    Body mass index is 35 kg/m.  Physical Exam   Constitutional: Appears well-developed and well-nourished.   Psych: Affect appropriate to situation.   Eyes: No scleral injection.   HENT: No OP obstruction.   Head: Normocephalic.   Cardiovascular: Normal rate and regular rhythm.   Respiratory: Effort normal, non-labored breathing.   GI: Soft.  No distension. There is no tenderness.   Skin: WDI.    Neurologic Examination  Mental Status -  Level of arousal and orientation to time, place, and person were  intact . Language including expression, naming, repetition, comprehension was assessed and found intact . Attention span and concentration were normal . Recent and remote memory were intact . Fund of Knowledge was assessed and was intact .  Cranial Nerves II - XII - II - Visual field intact OU . III, IV, VI - Extraocular movements intact . V - Facial sensation intact bilaterally . VII - Facial movement intact bilaterally . VIII - Hearing & vestibular intact bilaterally . X - Palate elevates symmetrically . XI - Chin turning & shoulder shrug intact bilaterally . XII -  Tongue protrusion intact .  Motor Strength - The patient's strength was normal in right arm and right leg and pronator drift was absent.  Left arm and left leg with drift Bulk was normal and fasciculations were absent .   Motor Tone - Muscle tone was assessed at the neck and appendages and was normal . Sensory - decreased on left side   Coordination - The patient had normal movements in the hands and feet with no ataxia or dysmetria.  Tremor was absent. Gait and Station - deferred.  Labs/Imaging/Neurodiagnostic studies   CBC:  Recent Labs  Lab Jan 06, 2024 0931 01-06-24 0932  WBC 7.5  --   NEUTROABS 4.2  --   HGB 15.0 15.0  HCT 44.2 44.0  MCV 82.3  --   PLT 372  --    Basic Metabolic Panel:  Lab Results  Component Value Date   NA 140 2024-01-06   K 4.0 01/06/2024   CO2 23 07/10/2022   GLUCOSE 107 (H) 2024/01/06   BUN 14 01-06-2024   CREATININE 0.60 06-Jan-2024   CALCIUM 8.4 (L) 07/10/2022   GFRNONAA >60 07/10/2022   GFRAA >60 04/26/2020   Lipid Panel:  Lab Results  Component Value Date   LDLCALC 104 (H) 07/20/2020   HgbA1c:  Lab Results  Component Value Date   HGBA1C 5.2 07/27/2019   Urine Drug Screen:     Component Value Date/Time   LABOPIA NONE DETECTED 12/09/2016 0024   COCAINSCRNUR NONE DETECTED 12/09/2016 0024   LABBENZ NONE DETECTED 12/09/2016 0024   AMPHETMU NONE DETECTED 12/09/2016 0024   THCU NONE DETECTED 12/09/2016 0024   LABBARB NONE DETECTED 12/09/2016 0024    Alcohol Level     Component Value Date/Time   ETH <11 05/19/2012 2137   INR  Lab Results  Component Value Date   INR 0.9 10/05/2007   APTT  Lab Results  Component Value Date   APTT 25 10/05/2007   AED levels: No results found for: "PHENYTOIN", "ZONISAMIDE", "LAMOTRIGINE", "LEVETIRACETA"  CT Head without contrast(Personally reviewed): No acute process, aspects 10    ASSESSMENT   Krystyne Tewksbury is a 43 y.o. female   with hx of anxiety and depression, HTN, HLD, migraine,  SVT who presents to [facility] ED via EMS for acute onset of left side numbness and tingling, left side weakness. Per EMS original call was for increased HR however en route she developed, left arm pain, left side numbness And left side weakness   RECOMMENDATIONS   Treat migraine MRI Brain  ______________________________________________________________________    Gevena Mart DNP, ACNPC-AG  [facility]

## 2023-12-17 NOTE — ED Notes (Signed)
 PT transported to MRI with this nurse, C.A

## 2023-12-17 NOTE — ED Provider Notes (Signed)
 [place] EMERGENCY DEPARTMENT AT [facility] Provider Note   CSN: [id number] Arrival date & time: 12/17/23  8295  An emergency department physician performed an initial assessment on this suspected stroke patient at [phone].  History  Chief Complaint  Patient presents with   Weakness   Code Stroke    Tanya [name] is a 43 y.o. female.   Weakness Patient presented as a code stroke.  Woke up normal this morning.  Then pain in the left side.  Reported history of SVT.  Did have some tachycardia.  Also anxiety.  History of migraines.  States she had a headache that felt like a migraine today.  Now states that her left side is heavy.  Somewhat tremulous.  Last reported normal at 820. For EMS reported initial tachycardia had improved.    Home Medications Prior to Admission medications   Medication Sig Start Date End Date Taking? Authorizing Provider  ALPRAZolam (XANAX XR) 1 MG 24 hr tablet Take 1 tablet (1 mg total) by mouth at bedtime. 12/08/23     ALPRAZolam (XANAX) 0.5 MG tablet Take 1 tablet (0.5 mg total) by mouth 2 (two) times daily. 10/13/23     ALPRAZolam (XANAX) 0.5 MG tablet Take 1 tablet (0.5 mg total) by mouth 2 (two) times daily. 11/11/23     atorvastatin (LIPITOR) 40 MG tablet Take one tablet (40 mg dose) by mouth daily. 07/31/23     atorvastatin (LIPITOR) 80 MG tablet Take 1 tablet (80 mg total) by mouth daily. 09/11/23     baclofen (LIORESAL) 10 MG tablet Take 1 tablet (10 mg total) by mouth 3 (three) times daily. 11/02/23   Carlisle Beers, FNP  DULoxetine (CYMBALTA) 30 MG capsule Take 1 capsule (30 mg total) by mouth at bedtime. 10/13/23     DULoxetine (CYMBALTA) 60 MG capsule Take 1 capsule (60 mg total) by mouth at bedtime. 08/19/23     DULoxetine (CYMBALTA) 60 MG capsule Take 1 capsule (60 mg total) by mouth at bedtime. 09/16/23     DULoxetine (CYMBALTA) 60 MG capsule Take 1 capsule (60 mg total) by mouth at bedtime. 10/13/23     eszopiclone (LUNESTA) 2 MG  TABS tablet Take 1 tablet (2 mg total) by mouth at bedtime. Patient not taking: Reported on 11/02/2023 08/05/23     Eszopiclone 3 MG TABS Take 1 tablet (3 mg total) by mouth at bedtime. 08/19/23     Eszopiclone 3 MG TABS Take 1 tablet (3 mg total) by mouth at bedtime. 08/25/23     Eszopiclone 3 MG TABS Take 1 tablet (3 mg total) by mouth at bedtime. 09/16/23     metoprolol succinate (TOPROL XL) 25 MG 24 hr tablet Take 0.5 tablets (12.5 mg total) by mouth as needed. 07/17/22   Gaston Islam., NP  pantoprazole (PROTONIX) 40 MG tablet Take 40 mg by mouth daily.    [provider]  pyridOXINE (B-6) 50 MG tablet Take 50 mg by mouth daily.    [provider]  QUEtiapine (SEROQUEL) 50 MG tablet Take 1 tablet (50 mg total) by mouth at bedtime. Patient not taking: Reported on 11/02/2023 10/13/23     Semaglutide-Weight Management (WEGOVY) 0.25 MG/0.5ML SOAJ Inject 0.25 mg under the skin every seven (7) days. 11/03/23     Semaglutide-Weight Management (WEGOVY) 0.5 MG/0.5ML SOAJ Inject 0.5 mg into the skin every 7 (seven) days. 12/01/23     topiramate (TOPAMAX) 100 MG tablet Take 2.5 tablets (250 mg total) by  mouth 2 (two) times daily. 05/12/19 05/30/21  Noni Saupe, MD  topiramate (TOPAMAX) 200 MG tablet Take 1 tablet (200 mg total) by mouth 2 (two) times daily. Patient not taking: Reported on 11/02/2023 10/31/22     topiramate (TOPAMAX) 25 MG tablet Take 1 tablet (25 mg total) by mouth 2 (two) times daily. Patient not taking: Reported on 11/02/2023 07/15/23     traMADol (ULTRAM) 50 MG tablet Take 1 tablet by mouth every 8 (eight) hours as needed. Patient not taking: Reported on 11/02/2023    [provider]  venlafaxine XR (EFFEXOR-XR) 37.5 MG 24 hr capsule Take 1 capsule (37.5 mg total) by mouth daily. Patient not taking: Reported on 11/02/2023 07/24/22     venlafaxine XR (EFFEXOR-XR) 75 MG 24 hr capsule Take 1 capsule (75 mg total) by mouth daily. Patient not taking: Reported  on 11/02/2023 11/19/22     vortioxetine HBr (TRINTELLIX) 10 MG TABS tablet Take 1 tablet (10 mg total) by mouth daily. 12/03/23     vortioxetine HBr (TRINTELLIX) 20 MG TABS tablet Take 1 tablet (20 mg total) by mouth daily. 12/08/23         Allergies    Imitrex [sumatriptan], Zolmitriptan, Elemental sulfur, Latex, Sulfa antibiotics, Aspirin, Nsaids, and Ibuprofen    Review of Systems   Review of Systems  Neurological:  Positive for weakness.    Physical Exam Updated Vital Signs BP 121/71 (BP Location: Right Arm)   Pulse 71   Temp 98.5 F (36.9 C) (Oral)   Wt 86.8 kg   LMP 03/12/2018 (Exact Date)   BMI 35.00 kg/m  Physical Exam Vitals and nursing note reviewed.  Cardiovascular:     Rate and Rhythm: Tachycardia present.  Musculoskeletal:     Cervical back: Neck supple.  Skin:    General: Skin is warm.  Neurological:     Mental Status: She is alert and oriented to person, place, and time.     Comments: Appears somewhat anxious.  Finger-nose intact bilaterally.  Good grip strength bilaterally.  Feels as if left upper extremity is heavier.  Does have heel shin intact on the left and right.  Good straight leg raise bilaterally.  Overall somewhat variable deficits.  Complete NIH scoring done by neurology.     ED Results / Procedures / Treatments   Labs (all labs ordered are listed, but only abnormal results are displayed) Labs Reviewed  CBC - Abnormal; Notable for the following components:      Result Value   RBC 5.37 (*)    All other components within normal limits  COMPREHENSIVE METABOLIC PANEL - Abnormal; Notable for the following components:   CO2 20 (*)    Glucose, Bld 114 (*)    All other components within normal limits  I-STAT CHEM 8, ED - Abnormal; Notable for the following components:   Glucose, Bld 107 (*)    Calcium, Ion 1.05 (*)    TCO2 20 (*)    All other components within normal limits  CBG MONITORING, ED - Abnormal; Notable for the following components:    Glucose-Capillary 113 (*)    All other components within normal limits  PROTIME-INR  APTT  DIFFERENTIAL  ETHANOL    EKG EKG Interpretation Date/Time:  Thursday December 17 2023 09:50:11 EDT Ventricular Rate:  72 PR Interval:  55 QRS Duration:  89 QT Interval:  383 QTC Calculation: 420 R Axis:   63  Text Interpretation: Sinus rhythm Short PR interval Low voltage, precordial leads  Artifact in lead(s) I II III aVR aVL Confirmed by Benjiman Core [id number] on 12/17/2023 10:18:03 AM  Radiology MR BRAIN WO CONTRAST Result Date: 12/17/2023 CLINICAL DATA:  Provided history: Neuro deficit, acute, stroke suspected. Left-sided weakness and numbness. EXAM: MRI HEAD WITHOUT CONTRAST TECHNIQUE: Multiplanar, multiecho pulse sequences of the brain and surrounding structures were obtained without intravenous contrast. COMPARISON:  Non-contrast head CT 12/17/2023.  Brain MRI 07/19/2019. FINDINGS: Brain: Cerebral volume is normal. Similar to the prior brain MRI of 07/19/2019, there are a few tiny foci of T2 FLAIR hyperintense signal abnormality scattered within the cerebral white matter. No cortical encephalomalacia is identified. There is no acute infarct. No evidence of an intracranial mass. No chronic intracranial blood products. No extra-axial fluid collection. No midline shift. Vascular: Maintained flow voids within the proximal large arterial vessels. Skull and upper cervical spine: No focal worrisome marrow lesion. Sinuses/Orbits: No mass or acute finding within the imaged orbits. Mild mucosal thickening along the floor of the left maxillary sinus. IMPRESSION: 1. No evidence of an acute intracranial abnormality. 2. There are a few tiny nonspecific chronic insults within the cerebral white matter, similar to the prior brain MRI of 07/19/2019. Otherwise unremarkable non-contrast MRI appearance of the brain. 3. Mild left maxillary sinus mucosal thickening. Electronically Signed   By: Jackey Loge D.O.   On:  12/17/2023 12:27   CT HEAD CODE STROKE WO CONTRAST Result Date: 12/17/2023 CLINICAL DATA:  Code stroke. Provided history: Neuro deficit, acute, stroke suspected. EXAM: CT HEAD WITHOUT CONTRAST TECHNIQUE: Contiguous axial images were obtained from the base of the skull through the vertex without intravenous contrast. RADIATION DOSE REDUCTION: This exam was performed according to the departmental dose-optimization program which includes automated exposure control, adjustment of the mA and/or kV according to patient size and/or use of iterative reconstruction technique. COMPARISON:  Brain MRI 07/19/2019.  Head CT 06/04/2014. FINDINGS: Brain: Cerebral volume is normal. Prominent perivascular space within the left basal ganglia inferiorly. There is no acute intracranial hemorrhage. No demarcated cortical infarct. No extra-axial fluid collection. No evidence of an intracranial mass. No midline shift. Vascular: No hyperdense vessel. Skull: No calvarial fracture or aggressive osseous lesion. Sinuses/Orbits: No mass or acute finding within the imaged orbits. Partially imaged mucosal thickening and/or fluid within the left maxillary sinus. Minimal mucosal thickening within the right maxillary sinus at the imaged levels. ASPECTS [facility] Stroke Program Early CT Score) - Ganglionic level infarction (caudate, lentiform nuclei, internal capsule, insula, M1-M3 cortex): 7 - Supraganglionic infarction (M4-M6 cortex): 3 Total score (0-10 with 10 being normal): 10 No evidence of an acute intracranial abnormality. These results were communicated to Dr. Selina Cooley At 9:38 amon 3/13/2025by text page via the [facility] messaging system. IMPRESSION: 1.  No evidence of an acute intracranial abnormality. 2. Paranasal sinus disease at the imaged levels as described. Electronically Signed   By: Jackey Loge D.O.   On: 12/17/2023 09:38    Procedures Procedures    Medications Ordered in ED Medications  sodium chloride flush (NS) 0.9 %  injection 3 mL (3 mLs Intravenous Given 12/17/23 0955)  prochlorperazine (COMPAZINE) injection 10 mg (10 mg Intravenous Given 12/17/23 6213)    ED Course/ Medical Decision Making/ A&P                                 Medical Decision Making Amount and/or Complexity of Data Reviewed Labs: ordered. Radiology: ordered.  Risk Prescription drug management.  Patient presented as a code stroke.  Met at the bridge by myself and Dr. Selina Cooley from neurology.  Mild deficits.  I think most likely is complicated migraine.  Discussed with neurology and thought even if MRI does show stroke would not be given TNK.  Do not need MRI immediately.  Will treat as potential migraine.  Initial head CT independently interpreted and reassuring.  EKG reassuring.  Workup reassuring.  Had been treated for migraine and deficits improved.  MRI negative.  I think most likely either complicated migraine versus it being due to her anxiety.  Either way has seen [facility] neurology in the past I think follow-up with them would be appropriate.  Will discharge home.  CRITICAL CARE Performed by: Benjiman Core Total critical care time: 30 minutes Critical care time was exclusive of separately billable procedures and treating other patients. Critical care was necessary to treat or prevent imminent or life-threatening deterioration. Critical care was time spent personally by me on the following activities: development of treatment plan with patient and/or surrogate as well as nursing, discussions with consultants, evaluation of patient's response to treatment, examination of patient, obtaining history from patient or surrogate, ordering and performing treatments and interventions, ordering and review of laboratory studies, ordering and review of radiographic studies, pulse oximetry and re-evaluation of patient's condition.         Final Clinical Impression(s) / ED Diagnoses Final diagnoses:  Complicated migraine     Rx / DC Orders ED Discharge Orders          Ordered    Ambulatory referral to Neurology       Comments: An appointment is requested in approximately: 2 weeks   12/17/23 1252              Benjiman Core, MD 12/17/23 1253

## 2023-12-26 ENCOUNTER — Other Ambulatory Visit (HOSPITAL_COMMUNITY): Payer: Self-pay

## 2023-12-26 MED ORDER — WEGOVY 1 MG/0.5ML ~~LOC~~ SOAJ
1.0000 mg | SUBCUTANEOUS | 1 refills | Status: DC
  Filled 2023-12-26 – 2024-01-01 (×2): qty 2, 28d supply, fill #0
  Filled 2024-01-29: qty 2, 28d supply, fill #1

## 2023-12-28 ENCOUNTER — Other Ambulatory Visit (HOSPITAL_COMMUNITY): Payer: Self-pay

## 2023-12-28 ENCOUNTER — Other Ambulatory Visit: Payer: Self-pay

## 2024-01-01 ENCOUNTER — Other Ambulatory Visit: Payer: Self-pay

## 2024-01-01 ENCOUNTER — Other Ambulatory Visit (HOSPITAL_COMMUNITY): Payer: Self-pay

## 2024-01-05 ENCOUNTER — Other Ambulatory Visit: Payer: Self-pay

## 2024-01-05 ENCOUNTER — Other Ambulatory Visit (HOSPITAL_COMMUNITY): Payer: Self-pay

## 2024-01-05 MED ORDER — TRINTELLIX 20 MG PO TABS
20.0000 mg | ORAL_TABLET | Freq: Every day | ORAL | 0 refills | Status: DC
Start: 2024-01-05 — End: 2024-02-02
  Filled 2024-01-05: qty 30, 30d supply, fill #0

## 2024-01-05 MED ORDER — BUSPIRONE HCL 7.5 MG PO TABS
7.5000 mg | ORAL_TABLET | Freq: Two times a day (BID) | ORAL | 0 refills | Status: AC
  Filled 2024-01-05: qty 60, 30d supply, fill #0

## 2024-01-29 ENCOUNTER — Other Ambulatory Visit (HOSPITAL_COMMUNITY): Payer: Self-pay

## 2024-02-02 ENCOUNTER — Other Ambulatory Visit: Payer: Self-pay

## 2024-02-02 ENCOUNTER — Other Ambulatory Visit (HOSPITAL_COMMUNITY): Payer: Self-pay

## 2024-02-02 MED ORDER — TEMAZEPAM 7.5 MG PO CAPS
7.5000 mg | ORAL_CAPSULE | Freq: Every evening | ORAL | 0 refills | Status: AC
  Filled 2024-02-02: qty 15, 15d supply, fill #0

## 2024-02-02 MED ORDER — TRINTELLIX 20 MG PO TABS
20.0000 mg | ORAL_TABLET | Freq: Every day | ORAL | 0 refills | Status: DC
  Filled 2024-02-02: qty 30, 30d supply, fill #0

## 2024-02-02 MED ORDER — BUSPIRONE HCL 15 MG PO TABS
ORAL_TABLET | ORAL | 0 refills | Status: DC
  Filled 2024-02-02: qty 45, 30d supply, fill #0

## 2024-02-03 ENCOUNTER — Other Ambulatory Visit (HOSPITAL_COMMUNITY): Payer: Self-pay

## 2024-02-03 MED ORDER — WEGOVY 1 MG/0.5ML ~~LOC~~ SOAJ
1.0000 mg | SUBCUTANEOUS | 1 refills | Status: AC
Start: 2024-02-03 — End: ?

## 2024-02-16 ENCOUNTER — Other Ambulatory Visit (HOSPITAL_COMMUNITY): Payer: Self-pay

## 2024-02-16 ENCOUNTER — Other Ambulatory Visit: Payer: Self-pay

## 2024-02-16 MED ORDER — TEMAZEPAM 15 MG PO CAPS: 15.0000 mg | ORAL_CAPSULE | Freq: Every day | ORAL | 0 refills | Status: AC

## 2024-02-16 MED ORDER — TEMAZEPAM 15 MG PO CAPS
15.0000 mg | ORAL_CAPSULE | Freq: Every day | ORAL | 0 refills | Status: AC
  Filled 2024-02-16: qty 15, 15d supply, fill #0

## 2024-02-16 MED ORDER — BUSPIRONE HCL 7.5 MG PO TABS
7.5000 mg | ORAL_TABLET | Freq: Every day | ORAL | 0 refills | Status: DC | PRN
  Filled 2024-02-16: qty 15, 15d supply, fill #0

## 2024-02-23 ENCOUNTER — Other Ambulatory Visit (HOSPITAL_COMMUNITY): Payer: Self-pay

## 2024-02-23 ENCOUNTER — Other Ambulatory Visit: Payer: Self-pay

## 2024-02-23 MED ORDER — WEGOVY 1 MG/0.5ML ~~LOC~~ SOAJ
1.0000 mg | SUBCUTANEOUS | 3 refills | Status: DC
  Filled 2024-02-23: qty 2, 28d supply, fill #0
  Filled 2024-03-25: qty 2, 28d supply, fill #1
  Filled 2024-04-22: qty 2, 28d supply, fill #2

## 2024-03-02 ENCOUNTER — Other Ambulatory Visit: Payer: Self-pay

## 2024-03-02 ENCOUNTER — Other Ambulatory Visit (HOSPITAL_COMMUNITY): Payer: Self-pay

## 2024-03-02 MED ORDER — BUSPIRONE HCL 7.5 MG PO TABS
7.5000 mg | ORAL_TABLET | Freq: Every day | ORAL | 0 refills | Status: AC | PRN
  Filled 2024-03-02: qty 30, 30d supply, fill #0

## 2024-03-02 MED ORDER — BUSPIRONE HCL 15 MG PO TABS
15.0000 mg | ORAL_TABLET | Freq: Two times a day (BID) | ORAL | 0 refills | Status: DC
  Filled 2024-03-02: qty 60, 30d supply, fill #0

## 2024-03-02 MED ORDER — TEMAZEPAM 15 MG PO CAPS
15.0000 mg | ORAL_CAPSULE | Freq: Every day | ORAL | 0 refills | Status: AC
  Filled 2024-03-02: qty 15, 15d supply, fill #0

## 2024-03-02 MED ORDER — TRINTELLIX 20 MG PO TABS
20.0000 mg | ORAL_TABLET | Freq: Every day | ORAL | 0 refills | Status: DC
  Filled 2024-03-02: qty 30, 30d supply, fill #0

## 2024-03-10 ENCOUNTER — Other Ambulatory Visit (HOSPITAL_COMMUNITY): Payer: Self-pay

## 2024-03-16 ENCOUNTER — Other Ambulatory Visit (HOSPITAL_COMMUNITY): Payer: Self-pay

## 2024-03-17 ENCOUNTER — Other Ambulatory Visit (HOSPITAL_COMMUNITY): Payer: Self-pay

## 2024-03-17 ENCOUNTER — Other Ambulatory Visit: Payer: Self-pay

## 2024-03-24 ENCOUNTER — Other Ambulatory Visit: Payer: Self-pay

## 2024-03-24 ENCOUNTER — Ambulatory Visit
Admission: RE | Admit: 2024-03-24 | Discharge: 2024-03-24 | Disposition: A | Discharge status: Home / Self Care | Source: Ambulatory Visit | Attending: Physician Assistant

## 2024-03-24 VITALS — BP 131/81 | HR 75 | Temp 98.3°F | Resp 17 | Ht 62.0 in | Wt 168.0 lb

## 2024-03-24 DIAGNOSIS — G43009 Migraine without aura, not intractable, without status migrainosus: Secondary | ICD-10-CM

## 2024-03-24 DIAGNOSIS — J358 Other chronic diseases of tonsils and adenoids: Secondary | ICD-10-CM

## 2024-03-24 LAB — POC COVID19/FLU A&B COMBO
Covid Antigen, POC: NEGATIVE
Influenza A Antigen, POC: NEGATIVE
Influenza B Antigen, POC: NEGATIVE

## 2024-03-24 LAB — POCT RAPID STREP A (OFFICE): Rapid Strep A Screen: NEGATIVE

## 2024-03-24 MED ORDER — ONDANSETRON 4 MG PO TBDP: 4.0000 mg | ORAL_TABLET | Freq: Three times a day (TID) | ORAL | 0 refills | Status: AC | PRN

## 2024-03-24 NOTE — ED Triage Notes (Addendum)
 Pt presents with complaints of migraines x 2 days and a sore throat that started today, 6/19. Pt currently rates her overall pain a 6/10. OTC Tylenol  taken PTA. Grand-babies have been ill, also in the healthcare field. Prescribed Topamax  taken as well. Hot flashes reported at home.

## 2024-03-24 NOTE — ED Provider Notes (Signed)
 [name]    CSN: [id number] Arrival date & time: 03/24/24  1000      History   Chief Complaint Chief Complaint  Patient presents with   Migraine    I've had a migraine for three days and now I have a sore throat. I just keep feeling worse - Entered by patient   Sore Throat    HPI Tanya [name] is a 43 y.o. female.   HPI  Pt reports concerns for migraine that has been ongoing since Monday  She states this feels like her typical migraine but she has not had one this severe in a while  She reports pain is mostly over her left eye and she reports photophobia She states she woke up with severe sore throat this AM and is concerned for potential illness  She denies vision changes or dizziness but states she feels like she is in a daze from headaches  She reports some mild nausea but no vomiting   She does admit to some hot flashes but has not checked temp at home, is unsure of fevers   She is regularly taking Topiramate  for migraine prevention but is not on an abortive medication    Past Medical History:  Diagnosis Date   Anemia    Anxiety    Arthritis    L knee OA   Depression    history of depression in 04-14-11 with death of mother   Hyperlipidemia    Hypertension    Migraine    Obesity 02/2017   has had gastric sleeve    Patient Active Problem List   Diagnosis Date Noted   Generalized anxiety disorder 05/12/2019   Insomnia due to psychological stress 05/12/2019   Gastroesophageal reflux disease without esophagitis 05/12/2019   Volume depletion 01/19/2019   Menorrhagia 03/25/2018   Nausea without vomiting 03/09/2018   LUQ abdominal pain 03/09/2018   Bowel habit changes 12/08/2017   Bloating 12/08/2017   Abdominal pain 09/04/2017   Anal fissure 09/04/2017   Constipation 09/04/2017   S/P laparoscopic sleeve gastrectomy May 2018 02/17/2017   Iron deficiency anemia due to chronic blood loss 02/01/2017   SVT (supraventricular tachycardia)  (HCC)    Anemia due to vitamin B12 deficiency 12/08/2016   Menometrorrhagia 12/05/2016   Morbid obesity (HCC) 12/05/2016   Chronic migraine without aura without status migrainosus, not intractable 12/05/2016    Past Surgical History:  Procedure Laterality Date   ABDOMINAL HYSTERECTOMY     ANAL RECTAL MANOMETRY N/A 01/11/2018   Procedure: ANO RECTAL MANOMETRY;  Surgeon: Sergio Dandy, MD;  Location: [facility];  Service: Endoscopy;  Laterality: N/A;   CARPAL TUNNEL RELEASE Bilateral    CESAREAN SECTION     x 1   CHOLECYSTECTOMY     KNEE SURGERY Left    LAPAROSCOPIC GASTRIC SLEEVE RESECTION N/A 02/17/2017   Procedure: LAPAROSCOPIC GASTRIC SLEEVE RESECTION, UPPER ENDO;  Surgeon: Jacolyn Matar, MD;  Location: [facility];  Service: General;  Laterality: N/A;   LAPAROSCOPIC UNILATERAL SALPINGECTOMY Left 06/10/2014   Procedure: LAPAROSCOPIC Left SALPINGECTOMY, removal of ectopic pregnancy;  Surgeon: Rik Chasten, MD;  Location: [facility];  Service: Gynecology;  Laterality: Left;   TUBAL LIGATION     VAGINAL HYSTERECTOMY Bilateral 03/25/2018   Procedure: HYSTERECTOMY VAGINAL, RIGHT SALPINGECTOMY;  Surgeon: Stevenson Elbe, MD;  Location: [facility];  Service: Gynecology;  Laterality: Bilateral;  ERAS   WISDOM TOOTH EXTRACTION      OB History  Gravida  3   Para  2   Term  1   Preterm      AB      Living  2      SAB      IAB      Ectopic      Multiple      Live Births  2            Home Medications    Prior to Admission medications   Medication Sig Start Date End Date Taking? Authorizing Provider  ondansetron  (ZOFRAN -ODT) 4 MG disintegrating tablet Take 1 tablet (4 mg total) by mouth every 8 (eight) hours as needed for nausea or vomiting. 03/24/24  Yes Jabarie Pop E, PA-C  topiramate  (TOPAMAX ) 25 MG tablet Take 1 tablet (25 mg total) by mouth 2 (two) times daily. 07/15/23  Yes   ALPRAZolam  (XANAX  XR) 1 MG 24 hr tablet Take 1 tablet (1 mg  total) by mouth at bedtime. 12/08/23     ALPRAZolam  (XANAX ) 0.5 MG tablet Take 1 tablet (0.5 mg total) by mouth 2 (two) times daily. 10/13/23     ALPRAZolam  (XANAX ) 0.5 MG tablet Take 1 tablet (0.5 mg total) by mouth 2 (two) times daily. 11/11/23     atorvastatin  (LIPITOR) 40 MG tablet Take one tablet (40 mg dose) by mouth daily. 07/31/23     atorvastatin  (LIPITOR) 80 MG tablet Take 1 tablet (80 mg total) by mouth daily. 09/11/23     baclofen  (LIORESAL ) 10 MG tablet Take 1 tablet (10 mg total) by mouth 3 (three) times daily. 11/02/23   Starlene Eaton, FNP  busPIRone  (BUSPAR ) 15 MG tablet Take 1 tablet (15 mg total) by mouth 2 (two) times daily. 03/01/24     busPIRone  (BUSPAR ) 7.5 MG tablet Take 1 tablet (7.5 mg total) by mouth 2 (two) times daily. 01/05/24     busPIRone  (BUSPAR ) 7.5 MG tablet Take 1 tablet (7.5 mg total) by mouth daily mid-day as needed for anxiety. 03/01/24     DULoxetine  (CYMBALTA ) 30 MG capsule Take 1 capsule (30 mg total) by mouth at bedtime. 10/13/23     DULoxetine  (CYMBALTA ) 60 MG capsule Take 1 capsule (60 mg total) by mouth at bedtime. 08/19/23     DULoxetine  (CYMBALTA ) 60 MG capsule Take 1 capsule (60 mg total) by mouth at bedtime. 09/16/23     DULoxetine  (CYMBALTA ) 60 MG capsule Take 1 capsule (60 mg total) by mouth at bedtime. 10/13/23     eszopiclone  (LUNESTA ) 2 MG TABS tablet Take 1 tablet (2 mg total) by mouth at bedtime. Patient not taking: Reported on 11/02/2023 08/05/23     Eszopiclone  3 MG TABS Take 1 tablet (3 mg total) by mouth at bedtime. 08/19/23     Eszopiclone  3 MG TABS Take 1 tablet (3 mg total) by mouth at bedtime. 08/25/23     Eszopiclone  3 MG TABS Take 1 tablet (3 mg total) by mouth at bedtime. 09/16/23     metoprolol  succinate (TOPROL  XL) 25 MG 24 hr tablet Take 0.5 tablets (12.5 mg total) by mouth as needed. 07/17/22   Gerald Kitty., NP  pantoprazole  (PROTONIX ) 40 MG tablet Take 40 mg by mouth daily.    [provider]  pyridOXINE (B-6) 50 MG  tablet Take 50 mg by mouth daily.    [provider]  QUEtiapine  (SEROQUEL ) 50 MG tablet Take 1 tablet (50 mg total) by mouth at bedtime. Patient not taking: Reported on 11/02/2023 10/13/23  Semaglutide -Weight Management (WEGOVY ) 0.25 MG/0.5ML SOAJ Inject 0.25 mg under the skin every seven (7) days. 11/03/23     Semaglutide -Weight Management (WEGOVY ) 0.5 MG/0.5ML SOAJ Inject 0.5 mg into the skin every 7 (seven) days. 12/01/23     Semaglutide -Weight Management (WEGOVY ) 1 MG/0.5ML SOAJ Inject 1 mg into the skin once a week. 02/03/24     Semaglutide -Weight Management (WEGOVY ) 1 MG/0.5ML SOAJ Inject 1 mg into the skin once a week. 02/22/24     temazepam  (RESTORIL ) 15 MG capsule Take 1 capsule (15 mg total) by mouth at bedtime. 02/16/24     temazepam  (RESTORIL ) 15 MG capsule Take 1 capsule (15 mg total) by mouth at bedtime. 02/16/24     temazepam  (RESTORIL ) 15 MG capsule Take 1 capsule (15 mg total) by mouth at bedtime. 03/01/24     temazepam  (RESTORIL ) 7.5 MG capsule Take 1 capsule (7.5 mg total) by mouth at bedtime. 02/02/24     topiramate  (TOPAMAX ) 100 MG tablet Take 2.5 tablets (250 mg total) by mouth 2 (two) times daily. 05/12/19 05/30/21  Orvel Blanco, MD  topiramate  (TOPAMAX ) 200 MG tablet Take 1 tablet (200 mg total) by mouth 2 (two) times daily. Patient not taking: Reported on 11/02/2023 10/31/22     traMADol  (ULTRAM ) 50 MG tablet Take 1 tablet by mouth every 8 (eight) hours as needed. Patient not taking: Reported on 11/02/2023    [provider]  venlafaxine  XR (EFFEXOR -XR) 37.5 MG 24 hr capsule Take 1 capsule (37.5 mg total) by mouth daily. Patient not taking: Reported on 11/02/2023 07/24/22     venlafaxine  XR (EFFEXOR -XR) 75 MG 24 hr capsule Take 1 capsule (75 mg total) by mouth daily. Patient not taking: Reported on 11/02/2023 11/19/22     vortioxetine  HBr (TRINTELLIX ) 10 MG TABS tablet Take 1 tablet (10 mg total) by mouth daily. 12/03/23     vortioxetine  HBr (TRINTELLIX ) 20 MG  TABS tablet Take 1 tablet (20 mg total) by mouth daily. 03/01/24       Family History Family History  Problem Relation Age of Onset   Breast cancer Mother 46   Cancer Mother    Prostate cancer Father 32   Colon polyps Father    Breast cancer Sister 22   Breast cancer Maternal Aunt        dx in her 60s; BRCA neg   Prostate cancer Paternal Uncle    Diabetes Paternal Uncle    Heart disease Maternal Grandmother    Diabetes Paternal Grandmother    Prostate cancer Paternal Grandfather    Ovarian cancer Sister 31       found during her pregnancy   Prostate cancer Paternal Uncle    Prostate cancer Paternal Uncle    Cancer Other    Stroke Other    Diabetes Other    Coronary artery disease Other    Colon cancer Neg Hx    Esophageal cancer Neg Hx    Rectal cancer Neg Hx     Social History Social History   Tobacco Use   Smoking status: Never   Smokeless tobacco: Never  Vaping Use   Vaping status: Never Used  Substance Use Topics   Alcohol use: Yes    Comment: socially   Drug use: No     Allergies   Imitrex [sumatriptan], Zolmitriptan, Elemental sulfur, Latex, Sulfa antibiotics, Aspirin, Nsaids, and Ibuprofen    Review of Systems Review of Systems  Constitutional:  Positive for diaphoresis and fatigue. Negative for chills and fever.  HENT:  Positive for sore throat. Negative for congestion and rhinorrhea.   Eyes:  Positive for photophobia. Negative for visual disturbance.  Respiratory:  Positive for cough (mild, started this AM). Negative for shortness of breath and wheezing.   Gastrointestinal:  Positive for nausea. Negative for vomiting.  Neurological:  Positive for headaches. Negative for facial asymmetry and speech difficulty.     Physical Exam Triage Vital Signs ED Triage Vitals  Encounter Vitals Group     BP 03/24/24 1020 131/81     Girls Systolic BP Percentile --      Girls Diastolic BP Percentile --      Boys Systolic BP Percentile --      Boys Diastolic  BP Percentile --      Pulse Rate 03/24/24 1020 75     Resp 03/24/24 1020 17     Temp 03/24/24 1020 98.3 F (36.8 C)     Temp Source 03/24/24 1020 Oral     SpO2 03/24/24 1020 100 %     Weight 03/24/24 1020 168 lb (76.2 kg)     Height 03/24/24 1020 5' 2 (1.575 m)     Head Circumference --      Peak Flow --      Pain Score 03/24/24 1031 6     Pain Loc --      Pain Education --      Exclude from Growth Chart --    No data found.  Updated Vital Signs BP 131/81 (BP Location: Right Arm)   Pulse 75   Temp 98.3 F (36.8 C) (Oral)   Resp 17   Ht 5' 2 (1.575 m)   Wt 168 lb (76.2 kg)   LMP 03/12/2018 (Exact Date)   SpO2 100%   BMI 30.73 kg/m   Visual Acuity Right Eye Distance:   Left Eye Distance:   Bilateral Distance:    Right Eye Near:   Left Eye Near:    Bilateral Near:     Physical Exam Vitals reviewed.  Constitutional:      General: She is awake.     Appearance: Normal appearance. She is well-developed and well-groomed.  HENT:     Head: Normocephalic and atraumatic.     Right Ear: Hearing, tympanic membrane and ear canal normal.     Left Ear: Hearing, tympanic membrane and ear canal normal.     Mouth/Throat:     Lips: Pink.     Mouth: Mucous membranes are moist.     Pharynx: Uvula midline. Oropharyngeal exudate, posterior oropharyngeal erythema and postnasal drip present. No pharyngeal swelling or uvula swelling.     Tonsils: Tonsillar exudate present. 1+ on the right. 1+ on the left.   Cardiovascular:     Rate and Rhythm: Normal rate and regular rhythm.     Pulses: Normal pulses.          Radial pulses are 2+ on the right side and 2+ on the left side.     Heart sounds: Normal heart sounds. No murmur heard.    No friction rub. No gallop.  Pulmonary:     Effort: Pulmonary effort is normal.     Breath sounds: Normal breath sounds. No decreased air movement. No decreased breath sounds, wheezing, rhonchi or rales.   Musculoskeletal:     Cervical back: Normal  range of motion and neck supple.  Lymphadenopathy:     Head:     Right side of head: No submental, submandibular or preauricular adenopathy.  Left side of head: No submental, submandibular or preauricular adenopathy.     Cervical:     Right cervical: No superficial cervical adenopathy.    Left cervical: No superficial cervical adenopathy.     Upper Body:     Right upper body: No supraclavicular adenopathy.     Left upper body: No supraclavicular adenopathy.   Skin:    General: Skin is warm and dry.   Neurological:     Mental Status: She is alert and oriented to person, place, and time.     Cranial Nerves: No cranial nerve deficit, dysarthria or facial asymmetry.   Psychiatric:        Mood and Affect: Mood normal.        Behavior: Behavior normal. Behavior is cooperative.        Thought Content: Thought content normal.      UC Treatments / Results  Labs (all labs ordered are listed, but only abnormal results are displayed) Labs Reviewed  POC COVID19/FLU A&B COMBO - Normal  POCT RAPID STREP A (OFFICE) - Normal  CULTURE, GROUP A STREP [facility])    EKG   Radiology No results found.  Procedures Procedures (including critical care time)  Medications Ordered in UC Medications - No data to display  Initial Impression / Assessment and Plan / UC Course  I have reviewed the triage vital signs and the nursing notes.  Pertinent labs & imaging results that were available during my care of the patient were reviewed by me and considered in my medical decision making (see chart for details).     Reviewed encounter note from 09/09/23 regarding chronic condition management with PCP Labs reviewed from 12/17/23- CMP, CBC, glucose  She has contraindication to NSAIDs due to gastric surgery    Final Clinical Impressions(s) / UC Diagnoses   Final diagnoses:  Tonsillar exudate  Migraine without aura and without status migrainosus, not intractable   Patient presents today with  concerns for persistent migraine along with sore throat.  She reports that this migraine feels typical of her previous migraines but she has not had one this severe in some time.  She is currently taking Topamax  for prevention but does not have an abortive.  She has allergy/contraindications to NSAIDs as well as triptans.I reviewed with patient that our available medications in clinic are contraindicated for her (Toradol  and imitrex). Recommend contacting her PCP for further guidance and to see if they can provide Nurtec for migraine abortive therapy. Recommend pt tries Tylenol , Benadryl  and Zofran  at home to assist with symptoms for now.  Pt flu, COVID and strep testing were negative. Patient has mild exudates present on left tonsil but no swelling and minimal redness. Will send strep culture for definitive rule out. Results to dictate further management. For now recommend OTC medications as needed for symptomatic relief. ED and return precautions reviewed and provided in AVS. Follow up as needed.      Discharge Instructions      Your rapid flu, strep, COVID testing.  I am sending a strep culture off for definitive rule out of strep as one of your tonsils looks like it may have some white patches on it.  Will keep you updated on these results as well as any medications that may be indicated once results have been finalized. For now I recommend taking Tylenol  as needed to help with your migraine.  I also recommend calling your PCP and asking about a medication called Nurtec to use as an abortive  for acute migraines. When you get home you can try taking one of the Zofran  that I have sent in  as well as a Benadryl  and a dose of Tylenol .  This is close to the migraine cocktail you would be provided in the emergency room and may help with some relief of your migraine If you feel like your headache is getting worse, is associated with facial drooping, speech changes, numbness or tingling on one side of the  body please go to the emergency room as these could be signs of a medical emergency.     ED Prescriptions     Medication Sig Dispense Auth. Provider   ondansetron  (ZOFRAN -ODT) 4 MG disintegrating tablet Take 1 tablet (4 mg total) by mouth every 8 (eight) hours as needed for nausea or vomiting. 20 tablet Stephonie Wilcoxen E, PA-C      PDMP not reviewed this encounter.   Zaidee Rion, Pearla Bottom, PA-C 03/24/24 1229

## 2024-03-24 NOTE — Discharge Instructions (Addendum)
 Your rapid flu, strep, COVID testing.  I am sending a strep culture off for definitive rule out of strep as one of your tonsils looks like it may have some white patches on it.  Will keep you updated on these results as well as any medications that may be indicated once results have been finalized. For now I recommend taking Tylenol  as needed to help with your migraine.  I also recommend calling your PCP and asking about a medication called Nurtec to use as an abortive for acute migraines. When you get home you can try taking one of the Zofran  that I have sent in  as well as a Benadryl  and a dose of Tylenol .  This is close to the migraine cocktail you would be provided in the emergency room and may help with some relief of your migraine If you feel like your headache is getting worse, is associated with facial drooping, speech changes, numbness or tingling on one side of the body please go to the emergency room as these could be signs of a medical emergency.

## 2024-03-25 ENCOUNTER — Other Ambulatory Visit (HOSPITAL_COMMUNITY): Payer: Self-pay

## 2024-03-27 LAB — CULTURE, GROUP A STREP (THRC)

## 2024-03-28 ENCOUNTER — Ambulatory Visit (HOSPITAL_COMMUNITY): Payer: Self-pay

## 2024-04-04 ENCOUNTER — Other Ambulatory Visit (HOSPITAL_COMMUNITY): Payer: Self-pay

## 2024-04-04 ENCOUNTER — Other Ambulatory Visit: Payer: Self-pay

## 2024-04-04 MED ORDER — BUSPIRONE HCL 15 MG PO TABS
15.0000 mg | ORAL_TABLET | Freq: Two times a day (BID) | ORAL | 0 refills | Status: AC
  Filled 2024-04-04: qty 60, 30d supply, fill #0

## 2024-04-04 MED ORDER — TRINTELLIX 20 MG PO TABS
20.0000 mg | ORAL_TABLET | Freq: Every day | ORAL | 0 refills | Status: DC
  Filled 2024-04-04: qty 90, 90d supply, fill #0

## 2024-04-04 MED ORDER — TEMAZEPAM 15 MG PO CAPS
30.0000 mg | ORAL_CAPSULE | Freq: Every day | ORAL | 0 refills | Status: AC
  Filled 2024-04-04: qty 15, 30d supply, fill #0

## 2024-04-04 MED ORDER — BUSPIRONE HCL 7.5 MG PO TABS
7.5000 mg | ORAL_TABLET | Freq: Every day | ORAL | 0 refills | Status: AC
  Filled 2024-04-04: qty 30, 30d supply, fill #0

## 2024-04-06 ENCOUNTER — Other Ambulatory Visit: Payer: Self-pay

## 2024-04-06 ENCOUNTER — Other Ambulatory Visit (HOSPITAL_COMMUNITY): Payer: Self-pay

## 2024-04-22 ENCOUNTER — Other Ambulatory Visit (HOSPITAL_COMMUNITY): Payer: Self-pay

## 2024-05-02 ENCOUNTER — Other Ambulatory Visit (HOSPITAL_COMMUNITY): Payer: Self-pay

## 2024-05-02 MED ORDER — TEMAZEPAM 15 MG PO CAPS: 30.0000 mg | ORAL_CAPSULE | Freq: Every evening | ORAL | 0 refills | Status: AC

## 2024-05-09 ENCOUNTER — Other Ambulatory Visit (HOSPITAL_COMMUNITY): Payer: Self-pay

## 2024-05-09 MED ORDER — WEGOVY 1.7 MG/0.75ML ~~LOC~~ SOAJ
1.7000 mg | SUBCUTANEOUS | 5 refills | Status: DC
  Filled 2024-05-09 – 2024-05-18 (×4): qty 3, 28d supply, fill #0
  Filled 2024-06-09: qty 3, 28d supply, fill #1

## 2024-05-10 ENCOUNTER — Other Ambulatory Visit (HOSPITAL_COMMUNITY): Payer: Self-pay

## 2024-05-18 ENCOUNTER — Other Ambulatory Visit (HOSPITAL_COMMUNITY): Payer: Self-pay

## 2024-05-18 ENCOUNTER — Other Ambulatory Visit: Payer: Self-pay

## 2024-06-09 ENCOUNTER — Other Ambulatory Visit (HOSPITAL_COMMUNITY): Payer: Self-pay

## 2024-06-14 ENCOUNTER — Other Ambulatory Visit (HOSPITAL_COMMUNITY): Payer: Self-pay

## 2024-06-14 ENCOUNTER — Other Ambulatory Visit: Payer: Self-pay

## 2024-06-14 MED ORDER — ARIPIPRAZOLE 2 MG PO TABS
2.0000 mg | ORAL_TABLET | Freq: Every evening | ORAL | 0 refills | Status: DC
  Filled 2024-06-14: qty 30, 30d supply, fill #0

## 2024-06-14 MED ORDER — TEMAZEPAM 15 MG PO CAPS
30.0000 mg | ORAL_CAPSULE | Freq: Every day | ORAL | 0 refills | Status: AC
  Filled 2024-06-14: qty 30, 15d supply, fill #0

## 2024-06-14 MED ORDER — TRINTELLIX 20 MG PO TABS
20.0000 mg | ORAL_TABLET | Freq: Every day | ORAL | 0 refills | Status: AC
  Filled 2024-06-14: qty 90, 90d supply, fill #0

## 2024-06-29 ENCOUNTER — Other Ambulatory Visit (HOSPITAL_COMMUNITY): Payer: Self-pay

## 2024-06-29 MED ORDER — ARIPIPRAZOLE 2 MG PO TABS
2.0000 mg | ORAL_TABLET | Freq: Every evening | ORAL | 0 refills | Status: DC
  Filled 2024-06-29 – 2024-07-27 (×2): qty 30, 30d supply, fill #0

## 2024-06-30 ENCOUNTER — Other Ambulatory Visit: Payer: Self-pay

## 2024-06-30 ENCOUNTER — Other Ambulatory Visit (HOSPITAL_COMMUNITY): Payer: Self-pay

## 2024-06-30 MED ORDER — CYANOCOBALAMIN 1000 MCG/ML IJ SOLN
1000.0000 ug | INTRAMUSCULAR | 3 refills | Status: AC
  Filled 2024-06-30: qty 3, 84d supply, fill #0
  Filled 2024-10-14: qty 3, 84d supply, fill #1
  Filled 2024-11-06: qty 3, 84d supply, fill #2

## 2024-06-30 MED ORDER — WEGOVY 2.4 MG/0.75ML ~~LOC~~ SOAJ
2.4000 mg | SUBCUTANEOUS | 3 refills | Status: AC
  Filled 2024-06-30 – 2024-07-04 (×3): qty 3, 28d supply, fill #0
  Filled 2024-07-27 – 2024-10-10 (×4): qty 3, 28d supply, fill #1

## 2024-07-01 ENCOUNTER — Other Ambulatory Visit (HOSPITAL_COMMUNITY): Payer: Self-pay

## 2024-07-04 ENCOUNTER — Other Ambulatory Visit (HOSPITAL_COMMUNITY): Payer: Self-pay

## 2024-07-27 ENCOUNTER — Other Ambulatory Visit (HOSPITAL_COMMUNITY): Payer: Self-pay

## 2024-07-27 ENCOUNTER — Other Ambulatory Visit: Payer: Self-pay

## 2024-07-27 MED ORDER — ARIPIPRAZOLE 2 MG PO TABS: 2.0000 mg | ORAL_TABLET | Freq: Every evening | ORAL | 0 refills | Status: AC

## 2024-07-27 MED ORDER — TEMAZEPAM 15 MG PO CAPS
30.0000 mg | ORAL_CAPSULE | Freq: Every day | ORAL | 0 refills | Status: AC
  Filled 2024-07-27: qty 30, 15d supply, fill #0

## 2024-07-28 ENCOUNTER — Other Ambulatory Visit (HOSPITAL_COMMUNITY): Payer: Self-pay

## 2024-07-28 ENCOUNTER — Other Ambulatory Visit: Payer: Self-pay

## 2024-08-02 ENCOUNTER — Other Ambulatory Visit: Payer: Self-pay | Admitting: Medical Genetics

## 2024-08-08 ENCOUNTER — Other Ambulatory Visit: Payer: Self-pay | Admitting: Family Medicine

## 2024-08-08 DIAGNOSIS — Z1231 Encounter for screening mammogram for malignant neoplasm of breast: Secondary | ICD-10-CM

## 2024-08-24 ENCOUNTER — Other Ambulatory Visit (HOSPITAL_BASED_OUTPATIENT_CLINIC_OR_DEPARTMENT_OTHER): Payer: Self-pay

## 2024-08-24 ENCOUNTER — Other Ambulatory Visit (HOSPITAL_COMMUNITY): Payer: Self-pay

## 2024-08-24 MED ORDER — ARIPIPRAZOLE 5 MG PO TABS
5.0000 mg | ORAL_TABLET | Freq: Every evening | ORAL | 0 refills | Status: AC
  Filled 2024-08-24: qty 90, 90d supply, fill #0

## 2024-08-25 ENCOUNTER — Other Ambulatory Visit: Payer: Self-pay

## 2024-08-25 ENCOUNTER — Other Ambulatory Visit (HOSPITAL_COMMUNITY): Payer: Self-pay

## 2024-08-25 MED ORDER — LANCETS 33G MISC
12 refills | Status: AC
  Filled 2024-08-25: qty 200, 40d supply, fill #0
  Filled 2024-11-06: qty 200, 40d supply, fill #1

## 2024-08-25 MED ORDER — PRECISION QID TEST VI STRP
ORAL_STRIP | 12 refills | Status: AC
  Filled 2024-08-25: qty 100, 20d supply, fill #0

## 2024-08-25 MED ORDER — ACARBOSE 25 MG PO TABS
25.0000 mg | ORAL_TABLET | Freq: Three times a day (TID) | ORAL | 11 refills | Status: AC
  Filled 2024-08-25: qty 90, 30d supply, fill #0
  Filled 2024-11-06: qty 90, 30d supply, fill #1

## 2024-08-25 MED ORDER — BAQSIMI ONE PACK 3 MG/DOSE NA POWD
NASAL | 2 refills | Status: AC
  Filled 2024-08-25: qty 1, 1d supply, fill #0
  Filled 2024-10-24: qty 1, 1d supply, fill #1

## 2024-08-25 MED ORDER — BLOOD GLUCOSE METER KIT
PACK | 0 refills | Status: AC
Start: 2024-08-24 — End: ?
  Filled 2024-08-25: qty 1, 30d supply, fill #0

## 2024-08-26 ENCOUNTER — Other Ambulatory Visit (HOSPITAL_COMMUNITY): Payer: Self-pay

## 2024-08-26 ENCOUNTER — Other Ambulatory Visit: Payer: Self-pay

## 2024-09-05 ENCOUNTER — Encounter

## 2024-09-05 DIAGNOSIS — Z1231 Encounter for screening mammogram for malignant neoplasm of breast: Secondary | ICD-10-CM

## 2024-09-06 ENCOUNTER — Ambulatory Visit
Admission: RE | Admit: 2024-09-06 | Discharge: 2024-09-06 | Disposition: A | Discharge status: Home / Self Care | Source: Ambulatory Visit | Attending: Student

## 2024-09-06 VITALS — BP 125/82 | HR 81 | Temp 97.9°F | Resp 17

## 2024-09-06 DIAGNOSIS — K0889 Other specified disorders of teeth and supporting structures: Secondary | ICD-10-CM

## 2024-09-06 DIAGNOSIS — H6501 Acute serous otitis media, right ear: Secondary | ICD-10-CM | POA: Diagnosis not present

## 2024-09-06 MED ORDER — AMOXICILLIN-POT CLAVULANATE 875-125 MG PO TABS: 1.0000 | ORAL_TABLET | Freq: Two times a day (BID) | ORAL | 0 refills | Status: AC

## 2024-09-06 NOTE — Discharge Instructions (Signed)
-  Start the antibiotic-Augmentin  (amoxicillin -clavulanate), 1 pill every 12 hours for 7 days.  You can take this with food like with breakfast and dinner. -Continue tylenol  for discomfort -Follow-up with dentist as scheduled on 09/09/24

## 2024-09-06 NOTE — ED Triage Notes (Signed)
 Pt c/o right ear pain and right side dental pain for 2 days.

## 2024-09-06 NOTE — ED Provider Notes (Signed)
 [facility]    CSN: [id number] Arrival date & time: 09/06/24  1021      History   Chief Complaint Chief Complaint  Patient presents with   Ear Fullness    Earache and toothache - Entered by patient    HPI Tanya [name] is a 43 y.o. female presenting w R ear fullness and toothache. Started with tooth pain, and progressed to R ear discomfort. Has been using tylenol  for discomfort without much relief.  Has a f/u with DDS on 09/09/24.  HPI  Past Medical History:  Diagnosis Date   Anemia    Anxiety    Arthritis    L knee OA   Depression    history of depression in 09/15/2011 with death of mother   Hyperlipidemia    Hypertension    Migraine    Obesity 02/2017   has had gastric sleeve    Patient Active Problem List   Diagnosis Date Noted   Generalized anxiety disorder 05/12/2019   Insomnia due to psychological stress 05/12/2019   Gastroesophageal reflux disease without esophagitis 05/12/2019   Volume depletion 01/19/2019   Menorrhagia 03/25/2018   Nausea without vomiting 03/09/2018   LUQ abdominal pain 03/09/2018   Bowel habit changes 12/08/2017   Bloating 12/08/2017   Abdominal pain 09/04/2017   Anal fissure 09/04/2017   Constipation 09/04/2017   S/P laparoscopic sleeve gastrectomy May 2018 02/17/2017   Iron deficiency anemia due to chronic blood loss 02/01/2017   SVT (supraventricular tachycardia)    Anemia due to vitamin B12 deficiency 12/08/2016   Menometrorrhagia 12/05/2016   Morbid obesity (HCC) 12/05/2016   Chronic migraine without aura without status migrainosus, not intractable 12/05/2016    Past Surgical History:  Procedure Laterality Date   ABDOMINAL HYSTERECTOMY     ANAL RECTAL MANOMETRY N/A 01/11/2018   Procedure: ANO RECTAL MANOMETRY;  Surgeon: Nandigam, Kavitha V, MD;  Location: [facility];  Service: Endoscopy;  Laterality: N/A;   CARPAL TUNNEL RELEASE Bilateral    CESAREAN SECTION     x 1   CHOLECYSTECTOMY     KNEE SURGERY Left     LAPAROSCOPIC GASTRIC SLEEVE RESECTION N/A 02/17/2017   Procedure: LAPAROSCOPIC GASTRIC SLEEVE RESECTION, UPPER ENDO;  Surgeon: Gladis Cough, MD;  Location: [facility];  Service: General;  Laterality: N/A;   LAPAROSCOPIC UNILATERAL SALPINGECTOMY Left 06/10/2014   Procedure: LAPAROSCOPIC Left SALPINGECTOMY, removal of ectopic pregnancy;  Surgeon: Burnard VEAR Pate, MD;  Location: [facility];  Service: Gynecology;  Laterality: Left;   TUBAL LIGATION     VAGINAL HYSTERECTOMY Bilateral 03/25/2018   Procedure: HYSTERECTOMY VAGINAL, RIGHT SALPINGECTOMY;  Surgeon: Raeanne Shanda SQUIBB, MD;  Location: [facility];  Service: Gynecology;  Laterality: Bilateral;  ERAS   WISDOM TOOTH EXTRACTION      OB History     Gravida  3   Para  2   Term  1   Preterm      AB      Living  2      SAB      IAB      Ectopic      Multiple      Live Births  2            Home Medications    Prior to Admission medications   Medication Sig Start Date End Date Taking? Authorizing Provider  amoxicillin -clavulanate (AUGMENTIN ) 875-125 MG tablet Take 1 tablet by mouth every 12 (twelve) hours. 09/06/24  Yes Soren Lazarz E, PA-C  acarbose  (PRECOSE ) 25 MG tablet Take 1 tablet (25 mg total) by mouth 3 (three) times daily with a meal. 08/24/24     ALPRAZolam  (XANAX  XR) 1 MG 24 hr tablet Take 1 tablet (1 mg total) by mouth at bedtime. 12/08/23     ALPRAZolam  (XANAX ) 0.5 MG tablet Take 1 tablet (0.5 mg total) by mouth 2 (two) times daily. 10/13/23     ALPRAZolam  (XANAX ) 0.5 MG tablet Take 1 tablet (0.5 mg total) by mouth 2 (two) times daily. 11/11/23     ARIPiprazole  (ABILIFY ) 2 MG tablet Take 1 tablet (2 mg total) by mouth every evening. 07/27/24     ARIPiprazole  (ABILIFY ) 5 MG tablet Take 1 tablet (5 mg total) by mouth every evening. 08/24/24     atorvastatin  (LIPITOR) 40 MG tablet Take one tablet (40 mg dose) by mouth daily. 07/31/23     atorvastatin  (LIPITOR) 80 MG tablet Take 1 tablet (80 mg total) by  mouth daily. 09/11/23     baclofen  (LIORESAL ) 10 MG tablet Take 1 tablet (10 mg total) by mouth 3 (three) times daily. 11/02/23   Enedelia Dorna HERO, FNP  blood glucose meter kit and supplies Use as instructed up to 5 times daily. 08/24/24     busPIRone  (BUSPAR ) 15 MG tablet Take 1 tablet (15 mg total) by mouth 2 (two) times daily. 04/04/24     busPIRone  (BUSPAR ) 7.5 MG tablet Take 1 tablet (7.5 mg total) by mouth 2 (two) times daily. 01/05/24     busPIRone  (BUSPAR ) 7.5 MG tablet Take 1 tablet (7.5 mg total) by mouth daily mid-day as needed for anxiety. 03/01/24     busPIRone  (BUSPAR ) 7.5 MG tablet Take 1 tablet (7.5 mg total) by mouth midday once daily as needed for anxiety 04/04/24     cyanocobalamin  (VITAMIN B12) 1000 MCG/ML injection Inject 1 mL (1,000 mcg total) as directed every 28 (twenty-eight) days. 06/30/24     DULoxetine  (CYMBALTA ) 30 MG capsule Take 1 capsule (30 mg total) by mouth at bedtime. 10/13/23     DULoxetine  (CYMBALTA ) 60 MG capsule Take 1 capsule (60 mg total) by mouth at bedtime. 08/19/23     DULoxetine  (CYMBALTA ) 60 MG capsule Take 1 capsule (60 mg total) by mouth at bedtime. 09/16/23     DULoxetine  (CYMBALTA ) 60 MG capsule Take 1 capsule (60 mg total) by mouth at bedtime. 10/13/23     eszopiclone  (LUNESTA ) 2 MG TABS tablet Take 1 tablet (2 mg total) by mouth at bedtime. Patient not taking: Reported on 11/02/2023 08/05/23     Eszopiclone  3 MG TABS Take 1 tablet (3 mg total) by mouth at bedtime. 08/19/23     Eszopiclone  3 MG TABS Take 1 tablet (3 mg total) by mouth at bedtime. 08/25/23     Eszopiclone  3 MG TABS Take 1 tablet (3 mg total) by mouth at bedtime. 09/16/23     Glucagon  (BAQSIMI  ONE PACK) 3 MG/DOSE POWD Use 1 spray in 1 nostril for severe hypoglycemia, as per package instructions 08/24/24     glucose blood (PRECISION QID TEST) test strip Use as needed for hypoglycemia, up to 5 times a day. 08/24/24     Lancets 33G MISC Use as directed to test blood glucose up to 5 times  daily. 08/24/24     metoprolol  succinate (TOPROL  XL) 25 MG 24 hr tablet Take 0.5 tablets (12.5 mg total) by mouth as needed. 07/17/22   Wyn Jackee VEAR Mickey., NP  ondansetron  (ZOFRAN -ODT) 4 MG disintegrating tablet Take 1  tablet (4 mg total) by mouth every 8 (eight) hours as needed for nausea or vomiting. 03/24/24   Mecum, Erin E, PA-C  pantoprazole  (PROTONIX ) 40 MG tablet Take 40 mg by mouth daily.    [provider]  pyridOXINE (B-6) 50 MG tablet Take 50 mg by mouth daily.    [provider]  QUEtiapine  (SEROQUEL ) 50 MG tablet Take 1 tablet (50 mg total) by mouth at bedtime. Patient not taking: Reported on 11/02/2023 10/13/23     Semaglutide -Weight Management (WEGOVY ) 0.25 MG/0.5ML SOAJ Inject 0.25 mg under the skin every seven (7) days. 11/03/23     Semaglutide -Weight Management (WEGOVY ) 0.5 MG/0.5ML SOAJ Inject 0.5 mg into the skin every 7 (seven) days. 12/01/23     Semaglutide -Weight Management (WEGOVY ) 1 MG/0.5ML SOAJ Inject 1 mg into the skin once a week. 02/03/24     semaglutide -weight management (WEGOVY ) 2.4 MG/0.75ML SOAJ SQ injection Inject 2.4 mg into the skin every 7 (seven) days. 06/30/24     temazepam  (RESTORIL ) 15 MG capsule Take 1 capsule (15 mg total) by mouth at bedtime. 02/16/24     temazepam  (RESTORIL ) 15 MG capsule Take 1 capsule (15 mg total) by mouth at bedtime. 02/16/24     temazepam  (RESTORIL ) 15 MG capsule Take 1 capsule (15 mg total) by mouth at bedtime. 03/01/24     temazepam  (RESTORIL ) 15 MG capsule Take 2 capsules (30 mg total) by mouth at bedtime. 04/04/24     temazepam  (RESTORIL ) 15 MG capsule Take 2 capsules (30 mg total) by mouth at bedtime. 05/02/24     temazepam  (RESTORIL ) 15 MG capsule Take 2 capsules (30 mg total) by mouth at bedtime. 06/14/24     temazepam  (RESTORIL ) 15 MG capsule Take 2 capsules (30 mg total) by mouth at bedtime. 07/27/24     temazepam  (RESTORIL ) 7.5 MG capsule Take 1 capsule (7.5 mg total) by mouth at bedtime. 02/02/24     topiramate   (TOPAMAX ) 100 MG tablet Take 2.5 tablets (250 mg total) by mouth 2 (two) times daily. 05/12/19 05/30/21  Melonie Tori Mikel CHRISTELLA, MD  topiramate  (TOPAMAX ) 200 MG tablet Take 1 tablet (200 mg total) by mouth 2 (two) times daily. Patient not taking: Reported on 11/02/2023 10/31/22     topiramate  (TOPAMAX ) 25 MG tablet Take 1 tablet (25 mg total) by mouth 2 (two) times daily. 07/15/23     traMADol  (ULTRAM ) 50 MG tablet Take 1 tablet by mouth every 8 (eight) hours as needed. Patient not taking: Reported on 11/02/2023    [provider]  venlafaxine  XR (EFFEXOR -XR) 37.5 MG 24 hr capsule Take 1 capsule (37.5 mg total) by mouth daily. Patient not taking: Reported on 11/02/2023 07/24/22     venlafaxine  XR (EFFEXOR -XR) 75 MG 24 hr capsule Take 1 capsule (75 mg total) by mouth daily. Patient not taking: Reported on 11/02/2023 11/19/22     vortioxetine  HBr (TRINTELLIX ) 10 MG TABS tablet Take 1 tablet (10 mg total) by mouth daily. 12/03/23     vortioxetine  HBr (TRINTELLIX ) 20 MG TABS tablet Take 1 tablet (20 mg total) by mouth daily. 06/14/24       Family History Family History  Problem Relation Age of Onset   Breast cancer Mother 82   Cancer Mother    Prostate cancer Father 89   Colon polyps Father    Breast cancer Sister 62   Breast cancer Maternal Aunt        dx in her 70s; BRCA neg   Prostate cancer Paternal  Uncle    Diabetes Paternal Uncle    Heart disease Maternal Grandmother    Diabetes Paternal Grandmother    Prostate cancer Paternal Grandfather    Ovarian cancer Sister 53       found during her pregnancy   Prostate cancer Paternal Uncle    Prostate cancer Paternal Uncle    Cancer Other    Stroke Other    Diabetes Other    Coronary artery disease Other    Colon cancer Neg Hx    Esophageal cancer Neg Hx    Rectal cancer Neg Hx     Social History Social History   Tobacco Use   Smoking status: Never   Smokeless tobacco: Never  Vaping Use   Vaping status: Never Used  Substance Use  Topics   Alcohol use: Yes    Comment: socially   Drug use: No     Allergies   Imitrex [sumatriptan], Zolmitriptan, Elemental sulfur, Latex, Sulfa antibiotics, Aspirin, Nsaids, and Ibuprofen    Review of Systems Review of Systems  HENT:  Positive for dental problem and ear pain.      Physical Exam Triage Vital Signs ED Triage Vitals  Encounter Vitals Group     BP      Girls Systolic BP Percentile      Girls Diastolic BP Percentile      Boys Systolic BP Percentile      Boys Diastolic BP Percentile      Pulse      Resp      Temp      Temp src      SpO2      Weight      Height      Head Circumference      Peak Flow      Pain Score      Pain Loc      Pain Education      Exclude from Growth Chart    No data found.  Updated Vital Signs BP 125/82 (BP Location: Right Arm)   Pulse 81   Temp 97.9 F (36.6 C) (Oral)   Resp 17   LMP 03/12/2018 (Exact Date)   SpO2 98%   Visual Acuity Right Eye Distance:   Left Eye Distance:   Bilateral Distance:    Right Eye Near:   Left Eye Near:    Bilateral Near:     Physical Exam Vitals reviewed.  Constitutional:      General: She is not in acute distress.    Appearance: Normal appearance. She is not ill-appearing or diaphoretic.  HENT:     Head: Normocephalic and atraumatic.     Right Ear: Hearing, ear canal and external ear normal. No tenderness. A middle ear effusion is present. There is no impacted cerumen. Tympanic membrane is erythematous. Tympanic membrane is not retracted or bulging.     Left Ear: Hearing, tympanic membrane, ear canal and external ear normal.     Mouth/Throat:     Dentition: Dental caries present.     Comments: R lower molar is cracked. Caries present Cardiovascular:     Rate and Rhythm: Normal rate and regular rhythm.     Heart sounds: Normal heart sounds.  Pulmonary:     Effort: Pulmonary effort is normal.     Breath sounds: Normal breath sounds.  Skin:    General: Skin is warm.   Neurological:     General: No focal deficit present.     Mental Status: She is alert  and oriented to person, place, and time.  Psychiatric:        Mood and Affect: Mood normal.        Behavior: Behavior normal.        Thought Content: Thought content normal.        Judgment: Judgment normal.      UC Treatments / Results  Labs (all labs ordered are listed, but only abnormal results are displayed) Labs Reviewed - No data to display  EKG   Radiology No results found.  Procedures Procedures (including critical care time)  Medications Ordered in UC Medications - No data to display  Initial Impression / Assessment and Plan / UC Course  I have reviewed the triage vital signs and the nursing notes.  Pertinent labs & imaging results that were available during my care of the patient were reviewed by me and considered in my medical decision making (see chart for details).     Patient is a pleasant 43 year old female presenting with dental pain and right ear pain. The patient is afebrile and nontachycardic.  Antipyretic has not been administered today. Status post hysterectomy.  On exam, the right tympanic membrane is erythematous, but is not retracted or bulging.  She is having dental pain at the site of a cracked and broken molar.  It would be reasonable to start Augmentin  while awaiting her dental follow-up on 09/09/2024.  Continue Tylenol  for discomfort.  Final Clinical Impressions(s) / UC Diagnoses   Final diagnoses:  Non-recurrent acute serous otitis media of right ear  Pain, dental     Discharge Instructions      -Start the antibiotic-Augmentin  (amoxicillin -clavulanate), 1 pill every 12 hours for 7 days.  You can take this with food like with breakfast and dinner. -Continue tylenol  for discomfort -Follow-up with dentist as scheduled on 09/09/24     ED Prescriptions     Medication Sig Dispense Auth. Provider   amoxicillin -clavulanate (AUGMENTIN ) 875-125 MG  tablet Take 1 tablet by mouth every 12 (twelve) hours. 14 tablet Shakeeta Godette E, PA-C      PDMP not reviewed this encounter.   Arlyss Leita BRAVO, PA-C 09/06/24 1048

## 2024-09-07 ENCOUNTER — Ambulatory Visit

## 2024-09-19 ENCOUNTER — Ambulatory Visit

## 2024-09-21 ENCOUNTER — Other Ambulatory Visit (HOSPITAL_COMMUNITY): Payer: Self-pay

## 2024-09-21 MED ORDER — TRINTELLIX 20 MG PO TABS
20.0000 mg | ORAL_TABLET | Freq: Every day | ORAL | 0 refills | Status: DC
  Filled 2024-09-21: qty 90, 90d supply, fill #0

## 2024-09-22 ENCOUNTER — Other Ambulatory Visit (HOSPITAL_COMMUNITY): Payer: Self-pay

## 2024-10-05 ENCOUNTER — Other Ambulatory Visit (HOSPITAL_COMMUNITY): Payer: Self-pay

## 2024-10-05 MED ORDER — WEGOVY 0.25 MG/0.5ML ~~LOC~~ SOAJ
0.2500 mg | SUBCUTANEOUS | 0 refills | Status: AC
  Filled 2024-10-05 – 2024-10-10 (×2): qty 2, 28d supply, fill #0

## 2024-10-10 ENCOUNTER — Other Ambulatory Visit (HOSPITAL_COMMUNITY): Payer: Self-pay

## 2024-10-10 ENCOUNTER — Ambulatory Visit

## 2024-10-11 ENCOUNTER — Ambulatory Visit

## 2024-10-11 ENCOUNTER — Other Ambulatory Visit (HOSPITAL_COMMUNITY): Payer: Self-pay

## 2024-10-14 ENCOUNTER — Other Ambulatory Visit: Payer: Self-pay

## 2024-10-17 ENCOUNTER — Other Ambulatory Visit: Payer: Self-pay | Admitting: Medical Genetics

## 2024-10-17 DIAGNOSIS — Z006 Encounter for examination for normal comparison and control in clinical research program: Secondary | ICD-10-CM

## 2024-10-18 ENCOUNTER — Other Ambulatory Visit (HOSPITAL_COMMUNITY): Payer: Self-pay

## 2024-10-18 MED ORDER — FLUTICASONE PROPIONATE 50 MCG/ACT NA SUSP
1.0000 | Freq: Every day | NASAL | 0 refills | Status: DC
  Filled 2024-10-18: qty 16, 60d supply, fill #0

## 2024-10-18 MED ORDER — WEGOVY 0.5 MG/0.5ML ~~LOC~~ SOAJ
0.5000 mg | SUBCUTANEOUS | 0 refills | Status: AC
  Filled 2024-10-18 – 2024-11-07 (×4): qty 2, 28d supply, fill #0
  Filled ????-??-??: fill #0

## 2024-10-18 MED ORDER — "SYRINGE 25G X 1"" 3 ML MISC"
3 refills | Status: AC
  Filled 2024-10-18: qty 3, 90d supply, fill #0
  Filled 2024-11-06: qty 3, 90d supply, fill #1

## 2024-10-19 ENCOUNTER — Other Ambulatory Visit: Payer: Self-pay

## 2024-10-20 ENCOUNTER — Other Ambulatory Visit (HOSPITAL_COMMUNITY)

## 2024-10-24 ENCOUNTER — Other Ambulatory Visit: Payer: Self-pay

## 2024-10-24 ENCOUNTER — Other Ambulatory Visit (HOSPITAL_COMMUNITY): Payer: Self-pay

## 2024-11-02 ENCOUNTER — Other Ambulatory Visit (HOSPITAL_COMMUNITY): Payer: Self-pay

## 2024-11-03 ENCOUNTER — Other Ambulatory Visit (HOSPITAL_COMMUNITY): Payer: Self-pay

## 2024-11-06 ENCOUNTER — Other Ambulatory Visit (HOSPITAL_COMMUNITY): Payer: Self-pay

## 2024-11-07 ENCOUNTER — Other Ambulatory Visit: Payer: Self-pay

## 2024-11-07 ENCOUNTER — Other Ambulatory Visit (HOSPITAL_COMMUNITY): Payer: Self-pay

## 2024-11-07 ENCOUNTER — Other Ambulatory Visit (HOSPITAL_BASED_OUTPATIENT_CLINIC_OR_DEPARTMENT_OTHER): Payer: Self-pay

## 2024-11-07 MED ORDER — FLUTICASONE PROPIONATE 50 MCG/ACT NA SUSP
1.0000 | Freq: Every day | NASAL | 11 refills | Status: AC
  Filled 2024-11-07: qty 16, 60d supply, fill #0

## 2024-11-07 MED ORDER — VORTIOXETINE HBR 20 MG PO TABS
20.0000 mg | ORAL_TABLET | Freq: Every day | ORAL | 0 refills | Status: AC
  Filled 2024-11-07 (×2): qty 90, 90d supply, fill #0

## 2024-11-07 MED ORDER — ACCU-CHEK GUIDE TEST VI STRP
ORAL_STRIP | 12 refills | Status: AC
  Filled 2024-11-07: qty 100, 20d supply, fill #0

## 2024-12-01 ENCOUNTER — Other Ambulatory Visit (HOSPITAL_COMMUNITY)
